# Patient Record
Sex: Male | Born: 1945 | Race: White | Marital: Married | Smoking: Never smoker
Health system: Southern US, Community
[De-identification: ages and names within clinical notes are randomized; demographics above are authoritative.]

## PROBLEM LIST (undated history)

## (undated) ENCOUNTER — Encounter

## (undated) ENCOUNTER — Encounter: Attending: Family Medicine | Primary: Family Medicine

## (undated) ENCOUNTER — Ambulatory Visit: Payer: MEDICARE | Attending: Physical Medicine & Rehabilitation | Primary: Physical Medicine & Rehabilitation

## (undated) ENCOUNTER — Telehealth

## (undated) ENCOUNTER — Encounter: Attending: Urology | Primary: Urology

## (undated) ENCOUNTER — Ambulatory Visit: Payer: MEDICARE | Attending: Urology | Primary: Urology

## (undated) ENCOUNTER — Encounter: Attending: Internal Medicine | Primary: Internal Medicine

## (undated) ENCOUNTER — Encounter: Attending: Adult Health | Primary: Adult Health

## (undated) ENCOUNTER — Telehealth
Attending: Student in an Organized Health Care Education/Training Program | Primary: Student in an Organized Health Care Education/Training Program

## (undated) ENCOUNTER — Telehealth: Attending: Urology | Primary: Urology

## (undated) ENCOUNTER — Encounter: Payer: MEDICARE | Attending: Family Medicine | Primary: Family Medicine

## (undated) ENCOUNTER — Ambulatory Visit

## (undated) ENCOUNTER — Telehealth: Attending: Surgical Oncology | Primary: Surgical Oncology

## (undated) ENCOUNTER — Inpatient Hospital Stay

## (undated) ENCOUNTER — Ambulatory Visit: Payer: MEDICARE

## (undated) ENCOUNTER — Telehealth: Attending: Hematology & Oncology | Primary: Hematology & Oncology

## (undated) ENCOUNTER — Ambulatory Visit
Attending: Student in an Organized Health Care Education/Training Program | Primary: Student in an Organized Health Care Education/Training Program

## (undated) ENCOUNTER — Encounter: Payer: MEDICARE | Attending: Urology | Primary: Urology

## (undated) ENCOUNTER — Telehealth: Attending: Adult Health | Primary: Adult Health

## (undated) ENCOUNTER — Encounter: Attending: Family | Primary: Family

## (undated) ENCOUNTER — Encounter: Payer: MEDICARE | Attending: Dentist | Primary: Dentist

## (undated) ENCOUNTER — Telehealth: Attending: Internal Medicine | Primary: Internal Medicine

## (undated) ENCOUNTER — Ambulatory Visit: Payer: MEDICARE | Attending: Mental Health | Primary: Mental Health

## (undated) ENCOUNTER — Ambulatory Visit: Payer: MEDICARE | Attending: Family Medicine | Primary: Family Medicine

## (undated) ENCOUNTER — Encounter: Attending: MOHS-Micrographic Surgery | Primary: MOHS-Micrographic Surgery

## (undated) ENCOUNTER — Telehealth: Attending: MOHS-Micrographic Surgery | Primary: MOHS-Micrographic Surgery

## (undated) ENCOUNTER — Ambulatory Visit: Payer: MEDICARE | Attending: Neurology | Primary: Neurology

## (undated) ENCOUNTER — Ambulatory Visit: Attending: Adult Health | Primary: Adult Health

## (undated) ENCOUNTER — Ambulatory Visit: Payer: MEDICARE | Attending: Hematology & Oncology | Primary: Hematology & Oncology

## (undated) ENCOUNTER — Ambulatory Visit: Payer: MEDICARE | Attending: Internal Medicine | Primary: Internal Medicine

## (undated) ENCOUNTER — Telehealth: Attending: Dentist | Primary: Dentist

## (undated) ENCOUNTER — Ambulatory Visit: Payer: MEDICARE | Attending: Hematology | Primary: Hematology

## (undated) ENCOUNTER — Telehealth: Attending: Family Medicine | Primary: Family Medicine

## (undated) ENCOUNTER — Ambulatory Visit: Payer: MEDICARE | Attending: Family | Primary: Family

## (undated) ENCOUNTER — Telehealth: Attending: Family | Primary: Family

## (undated) ENCOUNTER — Non-Acute Institutional Stay: Payer: MEDICARE | Attending: Family | Primary: Family

## (undated) ENCOUNTER — Non-Acute Institutional Stay: Payer: MEDICARE

## (undated) ENCOUNTER — Ambulatory Visit: Payer: MEDICARE | Attending: Cardiovascular Disease | Primary: Cardiovascular Disease

## (undated) ENCOUNTER — Encounter: Attending: Anesthesiology | Primary: Anesthesiology

## (undated) ENCOUNTER — Ambulatory Visit: Attending: Surgery | Primary: Surgery

## (undated) ENCOUNTER — Ambulatory Visit: Payer: MEDICARE | Attending: Adult Health | Primary: Adult Health

## (undated) ENCOUNTER — Ambulatory Visit: Attending: Family Medicine | Primary: Family Medicine

## (undated) ENCOUNTER — Encounter: Attending: Nurse Practitioner | Primary: Nurse Practitioner

## (undated) ENCOUNTER — Telehealth: Attending: Mental Health | Primary: Mental Health

## (undated) ENCOUNTER — Encounter: Payer: MEDICARE | Attending: Family | Primary: Family

## (undated) ENCOUNTER — Ambulatory Visit: Attending: Urology | Primary: Urology

## (undated) DIAGNOSIS — D649 Anemia, unspecified: Secondary | ICD-10-CM

## (undated) DIAGNOSIS — I499 Cardiac arrhythmia, unspecified: Secondary | ICD-10-CM

## (undated) DIAGNOSIS — E039 Hypothyroidism, unspecified: Secondary | ICD-10-CM

## (undated) HISTORY — PX: CHOLECYSTECTOMY: SHX55

## (undated) HISTORY — PX: COLON RESECTION: SHX5231

## (undated) HISTORY — PX: BACK SURGERY: SHX140

---
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
## 2010-05-06 HISTORY — PX: TOTAL HIP ARTHROPLASTY: SHX124

## 2014-09-05 LAB — LIPID PANEL
Cholesterol: 189 mg/dL (ref 0–200)
HDL: 40 mg/dL (ref 35–70)
LDL CALC: 107 mg/dL
TRIGLYCERIDES: 210 mg/dL — AB (ref 40–160)

## 2014-09-05 LAB — TSH: TSH: 2.7 u[IU]/mL (ref <=5.90)

## 2014-09-05 LAB — BASIC METABOLIC PANEL
BUN: 9 mg/dL (ref 4–21)
Creatinine: 1 mg/dL (ref <=1.3)

## 2014-09-05 LAB — CBC AND DIFFERENTIAL: HEMOGLOBIN: 14.4 g/dL (ref 13.5–17.5)

## 2015-03-08 ENCOUNTER — Other Ambulatory Visit: Payer: Self-pay | Admitting: Internal Medicine

## 2015-03-08 ENCOUNTER — Encounter: Payer: Self-pay | Admitting: Internal Medicine

## 2015-03-08 DIAGNOSIS — F5089 Other specified eating disorder: Secondary | ICD-10-CM | POA: Insufficient documentation

## 2015-03-08 DIAGNOSIS — N138 Other obstructive and reflux uropathy: Secondary | ICD-10-CM | POA: Insufficient documentation

## 2015-03-08 DIAGNOSIS — E039 Hypothyroidism, unspecified: Secondary | ICD-10-CM | POA: Insufficient documentation

## 2015-03-08 DIAGNOSIS — K5010 Crohn's disease of large intestine without complications: Secondary | ICD-10-CM | POA: Insufficient documentation

## 2015-03-08 DIAGNOSIS — I82409 Acute embolism and thrombosis of unspecified deep veins of unspecified lower extremity: Secondary | ICD-10-CM | POA: Insufficient documentation

## 2015-03-08 DIAGNOSIS — N401 Enlarged prostate with lower urinary tract symptoms: Secondary | ICD-10-CM

## 2015-03-08 DIAGNOSIS — S32000A Wedge compression fracture of unspecified lumbar vertebra, initial encounter for closed fracture: Secondary | ICD-10-CM | POA: Insufficient documentation

## 2016-11-08 MED ORDER — HYOSCYAMINE SULFATE 0.125 MG TABLET
ORAL_TABLET | Freq: Four times a day (QID) | ORAL | 2 refills | 0.00000 days | Status: CP | PRN
Start: 2016-11-08 — End: 2017-07-04

## 2016-11-24 ENCOUNTER — Emergency Department
Admission: EM | Admit: 2016-11-24 | Discharge: 2016-11-24 | Disposition: A | Discharge status: Home / Self Care | Payer: MEDICARE | Source: Intra-hospital | Attending: Family | Admitting: Family

## 2016-11-24 ENCOUNTER — Emergency Department
Admission: EM | Admit: 2016-11-24 | Discharge: 2016-11-24 | Disposition: A | Discharge status: Home / Self Care | Payer: MEDICARE | Source: Intra-hospital

## 2016-11-24 DIAGNOSIS — R197 Diarrhea, unspecified: Principal | ICD-10-CM

## 2016-11-24 MED ORDER — BIOTIN 0.5 MG/ML ORAL SOLUTION
Freq: Three times a day (TID) | ORAL | 0 refills | 0.00000 days | Status: CP
Start: 2016-11-24 — End: 2016-12-04

## 2016-11-24 MED ORDER — CEPHALEXIN 500 MG CAPSULE
ORAL_CAPSULE | Freq: Four times a day (QID) | ORAL | 0 refills | 0 days | Status: CP
Start: 2016-11-24 — End: 2016-12-01

## 2016-11-27 ENCOUNTER — Ambulatory Visit
Admission: RE | Admit: 2016-11-27 | Discharge: 2016-11-27 | Payer: MEDICARE | Attending: Family Medicine | Admitting: Family Medicine

## 2016-11-27 DIAGNOSIS — G4733 Obstructive sleep apnea (adult) (pediatric): Secondary | ICD-10-CM

## 2016-11-27 DIAGNOSIS — F411 Generalized anxiety disorder: Secondary | ICD-10-CM

## 2016-11-27 DIAGNOSIS — R682 Dry mouth, unspecified: Secondary | ICD-10-CM

## 2016-11-27 DIAGNOSIS — Z79899 Other long term (current) drug therapy: Secondary | ICD-10-CM

## 2016-11-27 DIAGNOSIS — Z9181 History of falling: Secondary | ICD-10-CM

## 2016-11-27 DIAGNOSIS — F321 Major depressive disorder, single episode, moderate: Secondary | ICD-10-CM

## 2016-11-27 DIAGNOSIS — E785 Hyperlipidemia, unspecified: Principal | ICD-10-CM

## 2016-11-27 MED ORDER — ATORVASTATIN 40 MG TABLET
ORAL_TABLET | Freq: Every day | ORAL | 11 refills | 0.00000 days | Status: CP
Start: 2016-11-27 — End: 2017-11-14

## 2016-12-03 ENCOUNTER — Ambulatory Visit
Admission: RE | Admit: 2016-12-03 | Discharge: 2016-12-03 | Disposition: A | Discharge status: Home / Self Care | Payer: MEDICARE | Attending: Family | Admitting: Family

## 2016-12-03 DIAGNOSIS — G4733 Obstructive sleep apnea (adult) (pediatric): Principal | ICD-10-CM

## 2016-12-03 DIAGNOSIS — G4734 Idiopathic sleep related nonobstructive alveolar hypoventilation: Secondary | ICD-10-CM

## 2016-12-03 DIAGNOSIS — R351 Nocturia: Secondary | ICD-10-CM

## 2016-12-09 MED ORDER — LEVOTHYROXINE 88 MCG TABLET
ORAL_TABLET | Freq: Every day | ORAL | 1 refills | 0.00000 days | Status: CP
Start: 2016-12-09 — End: 2017-06-02

## 2016-12-30 ENCOUNTER — Ambulatory Visit
Admission: RE | Admit: 2016-12-30 | Discharge: 2016-12-30 | Payer: MEDICARE | Attending: Family Medicine | Admitting: Family Medicine

## 2016-12-30 DIAGNOSIS — R682 Dry mouth, unspecified: Principal | ICD-10-CM

## 2016-12-30 DIAGNOSIS — F321 Major depressive disorder, single episode, moderate: Secondary | ICD-10-CM

## 2016-12-30 DIAGNOSIS — F411 Generalized anxiety disorder: Secondary | ICD-10-CM

## 2017-01-10 ENCOUNTER — Ambulatory Visit
Admission: RE | Admit: 2017-01-10 | Discharge: 2017-01-10 | Payer: MEDICARE | Attending: Internal Medicine | Admitting: Internal Medicine

## 2017-01-10 DIAGNOSIS — R109 Unspecified abdominal pain: Principal | ICD-10-CM

## 2017-01-27 ENCOUNTER — Ambulatory Visit: Admission: RE | Admit: 2017-01-27 | Discharge: 2017-01-27 | Payer: MEDICARE | Attending: Family Medicine

## 2017-01-27 DIAGNOSIS — Z23 Encounter for immunization: Secondary | ICD-10-CM

## 2017-01-27 DIAGNOSIS — R682 Dry mouth, unspecified: Secondary | ICD-10-CM

## 2017-01-27 DIAGNOSIS — F5089 Other specified eating disorder: Secondary | ICD-10-CM

## 2017-01-27 DIAGNOSIS — N401 Enlarged prostate with lower urinary tract symptoms: Secondary | ICD-10-CM

## 2017-01-27 DIAGNOSIS — R350 Frequency of micturition: Secondary | ICD-10-CM

## 2017-01-27 DIAGNOSIS — E039 Hypothyroidism, unspecified: Principal | ICD-10-CM

## 2017-01-27 DIAGNOSIS — G4733 Obstructive sleep apnea (adult) (pediatric): Secondary | ICD-10-CM

## 2017-01-27 DIAGNOSIS — F321 Major depressive disorder, single episode, moderate: Secondary | ICD-10-CM

## 2017-01-28 ENCOUNTER — Ambulatory Visit
Admission: RE | Admit: 2017-01-28 | Discharge: 2017-01-28 | Disposition: A | Discharge status: Home / Self Care | Payer: MEDICARE

## 2017-01-28 DIAGNOSIS — R682 Dry mouth, unspecified: Secondary | ICD-10-CM

## 2017-01-28 DIAGNOSIS — E039 Hypothyroidism, unspecified: Principal | ICD-10-CM

## 2017-03-08 ENCOUNTER — Ambulatory Visit
Admission: RE | Admit: 2017-03-08 | Discharge: 2017-03-08 | Disposition: A | Discharge status: Home / Self Care | Payer: MEDICARE

## 2017-03-08 DIAGNOSIS — R350 Frequency of micturition: Principal | ICD-10-CM

## 2017-03-24 MED ORDER — TAMSULOSIN 0.4 MG CAPSULE
ORAL_CAPSULE | 3 refills | 0 days | Status: CP
Start: 2017-03-24 — End: 2017-07-24

## 2017-03-31 MED ORDER — XARELTO 20 MG TABLET
ORAL_TABLET | 1 refills | 0 days | Status: CP
Start: 2017-03-31 — End: 2017-07-16

## 2017-04-03 ENCOUNTER — Emergency Department
Admission: EM | Admit: 2017-04-03 | Discharge: 2017-04-03 | Disposition: A | Discharge status: Home / Self Care | Payer: MEDICARE | Source: Intra-hospital

## 2017-04-03 DIAGNOSIS — R109 Unspecified abdominal pain: Principal | ICD-10-CM

## 2017-04-03 MED ORDER — ONDANSETRON HCL 4 MG TABLET
ORAL_TABLET | Freq: Three times a day (TID) | ORAL | 0 refills | 0.00000 days | Status: CP | PRN
Start: 2017-04-03 — End: 2017-04-07

## 2017-04-10 ENCOUNTER — Ambulatory Visit: Admission: RE | Admit: 2017-04-10 | Discharge: 2017-04-10 | Admitting: Urology

## 2017-04-10 DIAGNOSIS — N201 Calculus of ureter: Principal | ICD-10-CM

## 2017-04-19 ENCOUNTER — Ambulatory Visit
Admission: RE | Admit: 2017-04-19 | Discharge: 2017-04-19 | Disposition: A | Discharge status: Home / Self Care | Payer: MEDICARE

## 2017-04-19 ENCOUNTER — Emergency Department
Admission: EM | Admit: 2017-04-19 | Discharge: 2017-04-19 | Discharge status: Against Medical Advice | Payer: MEDICARE | Source: Intra-hospital | Attending: Emergency Medicine

## 2017-04-19 DIAGNOSIS — N201 Calculus of ureter: Principal | ICD-10-CM

## 2017-05-22 ENCOUNTER — Encounter: Admit: 2017-05-22 | Discharge: 2017-05-23 | Payer: MEDICARE | Attending: Urology | Primary: Urology

## 2017-05-22 DIAGNOSIS — N201 Calculus of ureter: Secondary | ICD-10-CM

## 2017-05-22 DIAGNOSIS — N390 Urinary tract infection, site not specified: Principal | ICD-10-CM

## 2017-05-23 MED ORDER — SUCRALFATE 1 GRAM TABLET
ORAL_TABLET | Freq: Four times a day (QID) | ORAL | 11 refills | 0 days | Status: CP
Start: 2017-05-23 — End: 2017-08-19

## 2017-05-26 MED ORDER — SULFAMETHOXAZOLE 800 MG-TRIMETHOPRIM 160 MG TABLET
ORAL_TABLET | Freq: Two times a day (BID) | ORAL | 0 refills | 0.00000 days | Status: CP
Start: 2017-05-26 — End: 2017-06-05

## 2017-06-02 ENCOUNTER — Encounter: Admit: 2017-06-02 | Discharge: 2017-06-03 | Payer: MEDICARE | Attending: Family Medicine | Primary: Family Medicine

## 2017-06-02 DIAGNOSIS — I2782 Chronic pulmonary embolism: Secondary | ICD-10-CM

## 2017-06-02 DIAGNOSIS — F5089 Other specified eating disorder: Secondary | ICD-10-CM

## 2017-06-02 DIAGNOSIS — I82531 Chronic embolism and thrombosis of right popliteal vein: Secondary | ICD-10-CM

## 2017-06-02 DIAGNOSIS — K130 Diseases of lips: Principal | ICD-10-CM

## 2017-06-02 DIAGNOSIS — F321 Major depressive disorder, single episode, moderate: Secondary | ICD-10-CM

## 2017-06-02 DIAGNOSIS — F411 Generalized anxiety disorder: Secondary | ICD-10-CM

## 2017-06-04 ENCOUNTER — Ambulatory Visit: Admit: 2017-06-04 | Discharge: 2017-06-05 | Payer: MEDICARE

## 2017-06-04 DIAGNOSIS — G4733 Obstructive sleep apnea (adult) (pediatric): Principal | ICD-10-CM

## 2017-06-04 DIAGNOSIS — K50119 Crohn's disease of large intestine with unspecified complications: Secondary | ICD-10-CM

## 2017-06-04 DIAGNOSIS — E039 Hypothyroidism, unspecified: Secondary | ICD-10-CM

## 2017-06-04 DIAGNOSIS — F321 Major depressive disorder, single episode, moderate: Secondary | ICD-10-CM

## 2017-06-04 DIAGNOSIS — K22719 Barrett's esophagus with dysplasia, unspecified: Secondary | ICD-10-CM

## 2017-06-06 MED ORDER — LEVOTHYROXINE 88 MCG TABLET
ORAL_TABLET | 1 refills | 0 days | Status: CP
Start: 2017-06-06 — End: 2017-11-14

## 2017-06-12 DIAGNOSIS — N202 Calculus of kidney with calculus of ureter: Principal | ICD-10-CM

## 2017-06-13 ENCOUNTER — Encounter: Admit: 2017-06-13 | Discharge: 2017-06-13 | Payer: MEDICARE

## 2017-06-13 ENCOUNTER — Encounter
Admit: 2017-06-13 | Discharge: 2017-06-13 | Payer: MEDICARE | Attending: Student in an Organized Health Care Education/Training Program | Primary: Student in an Organized Health Care Education/Training Program

## 2017-06-13 DIAGNOSIS — N202 Calculus of kidney with calculus of ureter: Principal | ICD-10-CM

## 2017-06-13 MED ORDER — SENNOSIDES 8.6 MG TABLET
ORAL_TABLET | Freq: Every day | ORAL | 0 refills | 0.00000 days | Status: CP
Start: 2017-06-13 — End: 2017-06-28

## 2017-06-13 MED ORDER — DOCUSATE SODIUM 100 MG CAPSULE
ORAL_CAPSULE | Freq: Two times a day (BID) | ORAL | 0 refills | 0 days | Status: CP
Start: 2017-06-13 — End: 2017-06-21

## 2017-06-13 MED ORDER — OXYCODONE 5 MG TABLET
ORAL_TABLET | ORAL | 0 refills | 0 days | Status: CP | PRN
Start: 2017-06-13 — End: 2017-06-17

## 2017-06-17 MED ORDER — OXYCODONE 5 MG TABLET
ORAL_TABLET | ORAL | 0 refills | 0.00000 days | Status: CP | PRN
Start: 2017-06-17 — End: 2017-06-22

## 2017-06-17 MED ORDER — OXYCODONE 5 MG TABLET: 5 mg | tablet | 0 refills | 0 days | Status: AC

## 2017-06-18 MED ORDER — SULFAMETHOXAZOLE 800 MG-TRIMETHOPRIM 160 MG TABLET
ORAL_TABLET | Freq: Two times a day (BID) | ORAL | 0 refills | 0 days | Status: CP
Start: 2017-06-18 — End: 2017-06-19

## 2017-06-19 ENCOUNTER — Encounter: Admit: 2017-06-19 | Discharge: 2017-06-20 | Payer: MEDICARE

## 2017-06-19 DIAGNOSIS — K1379 Other lesions of oral mucosa: Secondary | ICD-10-CM

## 2017-06-19 DIAGNOSIS — K130 Diseases of lips: Principal | ICD-10-CM

## 2017-06-19 MED ORDER — VALACYCLOVIR 1 GRAM TABLET
ORAL_TABLET | Freq: Every day | ORAL | 0 refills | 0.00000 days | Status: CP
Start: 2017-06-19 — End: 2017-07-04

## 2017-06-19 MED ORDER — LIDOCAINE 5 % TOPICAL OINTMENT
Freq: Every day | TOPICAL | 6 refills | 0.00000 days | Status: CP
Start: 2017-06-19 — End: 2017-07-24

## 2017-06-30 MED ORDER — NYSTATIN 100,000 UNIT/ML ORAL SUSPENSION
ORAL | 6 refills | 0.00000 days | Status: CP
Start: 2017-06-30 — End: 2017-07-04

## 2017-07-03 ENCOUNTER — Encounter: Admit: 2017-07-03 | Discharge: 2017-07-04 | Payer: MEDICARE

## 2017-07-03 DIAGNOSIS — K1379 Other lesions of oral mucosa: Principal | ICD-10-CM

## 2017-07-03 DIAGNOSIS — R21 Rash and other nonspecific skin eruption: Secondary | ICD-10-CM

## 2017-07-03 DIAGNOSIS — L439 Lichen planus, unspecified: Secondary | ICD-10-CM

## 2017-07-06 ENCOUNTER — Encounter: Admit: 2017-07-06 | Discharge: 2017-07-07 | Payer: MEDICARE

## 2017-07-06 DIAGNOSIS — R202 Paresthesia of skin: Principal | ICD-10-CM

## 2017-07-07 MED ORDER — POTASSIUM CHLORIDE ER 20 MEQ TABLET,EXTENDED RELEASE
ORAL_TABLET | Freq: Every day | ORAL | 0 refills | 0 days | Status: SS
Start: 2017-07-07 — End: 2017-07-16

## 2017-07-08 MED ORDER — CLOBETASOL 0.05 % TOPICAL GEL
2 refills | 0 days | Status: CP
Start: 2017-07-08 — End: 2017-12-15

## 2017-07-14 ENCOUNTER — Encounter: Admit: 2017-07-14 | Discharge: 2017-07-16 | Payer: MEDICARE

## 2017-07-14 ENCOUNTER — Ambulatory Visit: Admit: 2017-07-14 | Discharge: 2017-07-16 | Payer: MEDICARE

## 2017-07-14 DIAGNOSIS — R200 Anesthesia of skin: Principal | ICD-10-CM

## 2017-07-16 DIAGNOSIS — R200 Anesthesia of skin: Principal | ICD-10-CM

## 2017-07-16 MED ORDER — APIXABAN 5 MG TABLET
ORAL_TABLET | 1 refills | 0 days | Status: CP
Start: 2017-07-16 — End: 2017-09-03

## 2017-07-16 MED ORDER — POTASSIUM CHLORIDE ER 20 MEQ TABLET,EXTENDED RELEASE
ORAL_TABLET | Freq: Every day | ORAL | 0 refills | 0.00000 days | Status: CP
Start: 2017-07-16 — End: 2017-08-16

## 2017-07-16 MED ORDER — ACETAMINOPHEN 500 MG TABLET
ORAL_TABLET | Freq: Four times a day (QID) | ORAL | 0 refills | 0.00000 days | PRN
Start: 2017-07-16 — End: 2018-02-16

## 2017-07-16 MED ORDER — CEPHALEXIN 500 MG CAPSULE
ORAL_CAPSULE | Freq: Three times a day (TID) | ORAL | 0 refills | 0 days | Status: CP
Start: 2017-07-16 — End: 2017-08-16

## 2017-07-17 MED ORDER — CYANOCOBALAMIN (VIT B-12) 1,000 MCG TABLET
ORAL_TABLET | Freq: Every day | ORAL | 2 refills | 0.00000 days
Start: 2017-07-17 — End: 2017-08-19

## 2017-07-20 MED ORDER — PREDNISONE 20 MG TABLET
ORAL_TABLET | Freq: Every day | ORAL | 0 refills | 0.00000 days | Status: CP
Start: 2017-07-20 — End: 2017-07-24

## 2017-07-22 ENCOUNTER — Encounter: Admit: 2017-07-22 | Discharge: 2017-07-23 | Payer: MEDICARE | Attending: Family Medicine | Primary: Family Medicine

## 2017-07-22 DIAGNOSIS — E876 Hypokalemia: Secondary | ICD-10-CM

## 2017-07-22 DIAGNOSIS — K130 Diseases of lips: Secondary | ICD-10-CM

## 2017-07-22 DIAGNOSIS — E039 Hypothyroidism, unspecified: Secondary | ICD-10-CM

## 2017-07-22 DIAGNOSIS — D539 Nutritional anemia, unspecified: Secondary | ICD-10-CM

## 2017-07-22 DIAGNOSIS — B370 Candidal stomatitis: Principal | ICD-10-CM

## 2017-07-22 MED ORDER — CLOTRIMAZOLE 10 MG TROCHE
ORAL_TABLET | ORAL | 0 refills | 0 days | Status: CP
Start: 2017-07-22 — End: 2017-08-19

## 2017-07-22 MED ORDER — NYSTATIN 100,000 UNIT/ML ORAL SUSPENSION
Freq: Four times a day (QID) | ORAL | 0 refills | 0 days | Status: CP
Start: 2017-07-22 — End: 2017-08-19

## 2017-07-24 ENCOUNTER — Ambulatory Visit: Admit: 2017-07-24 | Discharge: 2017-07-24 | Payer: MEDICARE

## 2017-07-24 ENCOUNTER — Encounter: Admit: 2017-07-24 | Discharge: 2017-07-24 | Payer: MEDICARE

## 2017-07-24 ENCOUNTER — Encounter: Admit: 2017-07-24 | Discharge: 2017-07-24 | Payer: MEDICARE | Attending: Urology | Primary: Urology

## 2017-07-24 DIAGNOSIS — N201 Calculus of ureter: Principal | ICD-10-CM

## 2017-07-24 DIAGNOSIS — L439 Lichen planus, unspecified: Principal | ICD-10-CM

## 2017-07-24 DIAGNOSIS — R351 Nocturia: Secondary | ICD-10-CM

## 2017-07-24 DIAGNOSIS — N200 Calculus of kidney: Principal | ICD-10-CM

## 2017-07-24 MED ORDER — PREDNISONE 10 MG TABLET
ORAL_TABLET | 0 refills | 0 days | Status: CP
Start: 2017-07-24 — End: 2017-08-19

## 2017-07-25 ENCOUNTER — Encounter: Admit: 2017-07-25 | Discharge: 2017-08-07 | Payer: MEDICARE

## 2017-07-25 DIAGNOSIS — I2699 Other pulmonary embolism without acute cor pulmonale: Principal | ICD-10-CM

## 2017-08-07 ENCOUNTER — Encounter: Admit: 2017-08-07 | Discharge: 2017-08-08 | Payer: MEDICARE

## 2017-08-07 DIAGNOSIS — B370 Candidal stomatitis: Secondary | ICD-10-CM

## 2017-08-07 DIAGNOSIS — L439 Lichen planus, unspecified: Principal | ICD-10-CM

## 2017-08-07 MED ORDER — FLUCONAZOLE 150 MG TABLET
ORAL_TABLET | 0 refills | 0 days | Status: CP
Start: 2017-08-07 — End: 2017-09-02

## 2017-08-07 MED ORDER — MYCOPHENOLATE MOFETIL 250 MG CAPSULE
ORAL_CAPSULE | Freq: Two times a day (BID) | ORAL | 1 refills | 0 days | Status: CP
Start: 2017-08-07 — End: 2017-09-04

## 2017-08-16 ENCOUNTER — Encounter
Admit: 2017-08-16 | Discharge: 2017-08-16 | Disposition: A | Discharge status: Home / Self Care | Payer: MEDICARE | Attending: Family

## 2017-08-16 DIAGNOSIS — L438 Other lichen planus: Principal | ICD-10-CM

## 2017-08-16 MED ORDER — POTASSIUM CHLORIDE ER 20 MEQ TABLET,EXTENDED RELEASE(PART/CRYST)
ORAL_TABLET | Freq: Every day | ORAL | 0 refills | 0.00000 days | Status: CP
Start: 2017-08-16 — End: 2017-09-02

## 2017-08-19 ENCOUNTER — Encounter: Admit: 2017-08-19 | Discharge: 2017-08-20 | Payer: MEDICARE

## 2017-08-19 DIAGNOSIS — N201 Calculus of ureter: Secondary | ICD-10-CM

## 2017-08-19 DIAGNOSIS — N400 Enlarged prostate without lower urinary tract symptoms: Secondary | ICD-10-CM

## 2017-08-19 DIAGNOSIS — R3121 Asymptomatic microscopic hematuria: Principal | ICD-10-CM

## 2017-08-19 DIAGNOSIS — Z7189 Other specified counseling: Secondary | ICD-10-CM

## 2017-08-22 ENCOUNTER — Encounter
Admit: 2017-08-22 | Discharge: 2017-08-23 | Payer: MEDICARE | Attending: Nurse Practitioner | Primary: Nurse Practitioner

## 2017-08-22 DIAGNOSIS — R200 Anesthesia of skin: Principal | ICD-10-CM

## 2017-08-22 DIAGNOSIS — R239 Unspecified skin changes: Secondary | ICD-10-CM

## 2017-08-27 ENCOUNTER — Encounter: Admit: 2017-08-27 | Discharge: 2017-08-28 | Payer: MEDICARE

## 2017-08-27 DIAGNOSIS — G6289 Other specified polyneuropathies: Principal | ICD-10-CM

## 2017-08-27 DIAGNOSIS — G603 Idiopathic progressive neuropathy: Secondary | ICD-10-CM

## 2017-09-02 ENCOUNTER — Encounter: Admit: 2017-09-02 | Discharge: 2017-09-02 | Payer: MEDICARE | Attending: Family Medicine | Primary: Family Medicine

## 2017-09-02 ENCOUNTER — Encounter: Admit: 2017-09-02 | Discharge: 2017-09-02 | Payer: MEDICARE | Attending: Mental Health | Primary: Mental Health

## 2017-09-02 DIAGNOSIS — Z0000 Encounter for general adult medical examination without abnormal findings: Principal | ICD-10-CM

## 2017-09-02 DIAGNOSIS — E039 Hypothyroidism, unspecified: Secondary | ICD-10-CM

## 2017-09-02 DIAGNOSIS — R4189 Other symptoms and signs involving cognitive functions and awareness: Secondary | ICD-10-CM

## 2017-09-02 DIAGNOSIS — E876 Hypokalemia: Principal | ICD-10-CM

## 2017-09-02 DIAGNOSIS — G6289 Other specified polyneuropathies: Secondary | ICD-10-CM

## 2017-09-03 MED ORDER — APIXABAN 5 MG TABLET
ORAL_TABLET | 1 refills | 0 days | Status: CP
Start: 2017-09-03 — End: 2017-11-14

## 2017-09-04 ENCOUNTER — Encounter: Admit: 2017-09-04 | Discharge: 2017-09-05 | Payer: MEDICARE

## 2017-09-04 DIAGNOSIS — L439 Lichen planus, unspecified: Principal | ICD-10-CM

## 2017-09-04 MED ORDER — MYCOPHENOLATE MOFETIL 250 MG CAPSULE
ORAL_CAPSULE | Freq: Two times a day (BID) | ORAL | 3 refills | 0 days | Status: CP
Start: 2017-09-04 — End: 2017-12-15

## 2017-09-04 MED ORDER — DEXAMETHASONE 0.5 MG/5 ML ORAL SOLUTION
3 refills | 0 days | Status: CP
Start: 2017-09-04 — End: 2017-10-11

## 2017-09-16 ENCOUNTER — Encounter
Admit: 2017-09-16 | Discharge: 2017-09-17 | Payer: MEDICARE | Attending: Hematology & Oncology | Primary: Hematology & Oncology

## 2017-09-16 DIAGNOSIS — I2699 Other pulmonary embolism without acute cor pulmonale: Principal | ICD-10-CM

## 2017-09-30 ENCOUNTER — Encounter: Admit: 2017-09-30 | Discharge: 2017-09-30 | Disposition: A | Discharge status: Home / Self Care | Payer: MEDICARE

## 2017-10-03 MED ORDER — ESOMEPRAZOLE MAGNESIUM 40 MG CAPSULE,DELAYED RELEASE
ORAL_CAPSULE | 0 refills | 0 days | Status: CP
Start: 2017-10-03 — End: 2017-12-15

## 2017-10-07 ENCOUNTER — Encounter: Admit: 2017-10-07 | Discharge: 2017-10-08 | Payer: MEDICARE | Attending: Family Medicine | Primary: Family Medicine

## 2017-10-07 DIAGNOSIS — H6983 Other specified disorders of Eustachian tube, bilateral: Secondary | ICD-10-CM

## 2017-10-07 DIAGNOSIS — K130 Diseases of lips: Secondary | ICD-10-CM

## 2017-10-07 DIAGNOSIS — G6289 Other specified polyneuropathies: Principal | ICD-10-CM

## 2017-10-07 DIAGNOSIS — E039 Hypothyroidism, unspecified: Secondary | ICD-10-CM

## 2017-10-07 MED ORDER — FLUTICASONE PROPIONATE 50 MCG/ACTUATION NASAL SPRAY,SUSPENSION
Freq: Every day | NASAL | 11 refills | 0.00000 days | Status: CP
Start: 2017-10-07 — End: 2018-02-16

## 2017-10-11 MED ORDER — DEXAMETHASONE 0.5 MG/5 ML ORAL SOLUTION
6 refills | 0 days | Status: CP
Start: 2017-10-11 — End: 2018-02-16

## 2017-10-11 MED ORDER — DEXAMETHASONE 0.5 MG/5 ML ORAL SOLUTION: mL | 6 refills | 0 days | Status: AC

## 2017-10-27 MED ORDER — NYSTATIN 100,000 UNIT/ML ORAL SUSPENSION
6 refills | 0 days | Status: CP
Start: 2017-10-27 — End: 2017-12-15

## 2017-11-13 ENCOUNTER — Encounter: Admit: 2017-11-13 | Discharge: 2017-11-14 | Payer: MEDICARE

## 2017-11-13 DIAGNOSIS — L439 Lichen planus, unspecified: Secondary | ICD-10-CM

## 2017-11-13 DIAGNOSIS — R21 Rash and other nonspecific skin eruption: Principal | ICD-10-CM

## 2017-11-13 MED ORDER — TACROLIMUS 0.1 % TOPICAL OINTMENT
9 refills | 0 days | Status: CP
Start: 2017-11-13 — End: 2018-02-16

## 2017-11-14 MED ORDER — LEVOTHYROXINE 88 MCG TABLET
ORAL_TABLET | 1 refills | 0 days | Status: CP
Start: 2017-11-14 — End: 2017-12-19

## 2017-11-14 MED ORDER — ATORVASTATIN 40 MG TABLET
ORAL_TABLET | 11 refills | 0 days | Status: CP
Start: 2017-11-14 — End: 2018-11-13

## 2017-11-14 MED ORDER — APIXABAN 5 MG TABLET
ORAL_TABLET | 1 refills | 0 days | Status: CP
Start: 2017-11-14 — End: 2018-01-09

## 2017-12-10 ENCOUNTER — Ambulatory Visit: Admit: 2017-12-10 | Discharge: 2017-12-11 | Payer: MEDICARE

## 2017-12-10 DIAGNOSIS — R351 Nocturia: Principal | ICD-10-CM

## 2017-12-15 ENCOUNTER — Encounter: Admit: 2017-12-15 | Discharge: 2017-12-16 | Payer: MEDICARE | Attending: Family Medicine | Primary: Family Medicine

## 2017-12-15 DIAGNOSIS — N401 Enlarged prostate with lower urinary tract symptoms: Secondary | ICD-10-CM

## 2017-12-15 DIAGNOSIS — R350 Frequency of micturition: Secondary | ICD-10-CM

## 2017-12-15 DIAGNOSIS — E876 Hypokalemia: Secondary | ICD-10-CM

## 2017-12-15 DIAGNOSIS — D539 Nutritional anemia, unspecified: Secondary | ICD-10-CM

## 2017-12-15 DIAGNOSIS — E039 Hypothyroidism, unspecified: Principal | ICD-10-CM

## 2017-12-15 MED ORDER — TAMSULOSIN 0.4 MG CAPSULE
ORAL_CAPSULE | Freq: Two times a day (BID) | ORAL | 0 refills | 0 days | Status: CP
Start: 2017-12-15 — End: 2018-02-16

## 2017-12-15 MED ORDER — CLOTRIMAZOLE 10 MG TROCHE
Freq: Three times a day (TID) | ORAL | 0 refills | 0 days | Status: CP
Start: 2017-12-15 — End: 2018-02-16

## 2017-12-19 MED ORDER — LEVOTHYROXINE 100 MCG TABLET
ORAL_TABLET | Freq: Every day | ORAL | 11 refills | 0 days | Status: CP
Start: 2017-12-19 — End: 2018-12-10

## 2017-12-30 ENCOUNTER — Ambulatory Visit: Admit: 2017-12-30 | Discharge: 2017-12-31 | Payer: MEDICARE | Attending: Neurology | Primary: Neurology

## 2017-12-30 DIAGNOSIS — G6289 Other specified polyneuropathies: Secondary | ICD-10-CM

## 2017-12-30 DIAGNOSIS — R4189 Other symptoms and signs involving cognitive functions and awareness: Principal | ICD-10-CM

## 2018-01-08 ENCOUNTER — Ambulatory Visit: Admit: 2018-01-08 | Discharge: 2018-01-09 | Payer: MEDICARE | Attending: Urology | Primary: Urology

## 2018-01-08 ENCOUNTER — Encounter: Admit: 2018-01-08 | Discharge: 2018-01-09 | Payer: MEDICARE

## 2018-01-08 DIAGNOSIS — L439 Lichen planus, unspecified: Principal | ICD-10-CM

## 2018-01-08 DIAGNOSIS — R399 Unspecified symptoms and signs involving the genitourinary system: Secondary | ICD-10-CM

## 2018-01-08 DIAGNOSIS — N401 Enlarged prostate with lower urinary tract symptoms: Principal | ICD-10-CM

## 2018-01-08 DIAGNOSIS — R350 Frequency of micturition: Secondary | ICD-10-CM

## 2018-01-08 DIAGNOSIS — R351 Nocturia: Secondary | ICD-10-CM

## 2018-01-08 DIAGNOSIS — K1379 Other lesions of oral mucosa: Secondary | ICD-10-CM

## 2018-01-08 DIAGNOSIS — K130 Diseases of lips: Secondary | ICD-10-CM

## 2018-01-09 MED ORDER — APIXABAN 5 MG TABLET
2 refills | 0 days | Status: CP
Start: 2018-01-09 — End: 2018-09-14

## 2018-02-05 ENCOUNTER — Ambulatory Visit: Admit: 2018-02-05 | Discharge: 2018-02-05 | Payer: MEDICARE

## 2018-02-05 ENCOUNTER — Encounter: Admit: 2018-02-05 | Discharge: 2018-02-05 | Payer: MEDICARE | Attending: Urology | Primary: Urology

## 2018-02-05 ENCOUNTER — Encounter: Admit: 2018-02-05 | Discharge: 2018-02-05 | Payer: MEDICARE | Attending: Family | Primary: Family

## 2018-02-05 DIAGNOSIS — L439 Lichen planus, unspecified: Secondary | ICD-10-CM

## 2018-02-05 DIAGNOSIS — B001 Herpesviral vesicular dermatitis: Principal | ICD-10-CM

## 2018-02-05 DIAGNOSIS — R399 Unspecified symptoms and signs involving the genitourinary system: Principal | ICD-10-CM

## 2018-02-05 DIAGNOSIS — K1379 Other lesions of oral mucosa: Secondary | ICD-10-CM

## 2018-02-05 MED ORDER — VALACYCLOVIR 1 GRAM TABLET
ORAL_TABLET | 0 refills | 0 days | Status: CP
Start: 2018-02-05 — End: 2018-02-12

## 2018-02-12 ENCOUNTER — Ambulatory Visit: Admit: 2018-02-12 | Discharge: 2018-02-13 | Payer: MEDICARE

## 2018-02-12 DIAGNOSIS — K1379 Other lesions of oral mucosa: Secondary | ICD-10-CM

## 2018-02-12 DIAGNOSIS — B001 Herpesviral vesicular dermatitis: Principal | ICD-10-CM

## 2018-02-12 MED ORDER — VALACYCLOVIR 500 MG TABLET
Freq: Every day | ORAL | 0 refills | 0 days | Status: CP
Start: 2018-02-12 — End: 2018-03-14

## 2018-02-16 ENCOUNTER — Encounter: Admit: 2018-02-16 | Discharge: 2018-02-17 | Payer: MEDICARE | Attending: Family Medicine | Primary: Family Medicine

## 2018-02-16 DIAGNOSIS — R351 Nocturia: Secondary | ICD-10-CM

## 2018-02-16 DIAGNOSIS — R413 Other amnesia: Secondary | ICD-10-CM

## 2018-02-16 DIAGNOSIS — H9190 Unspecified hearing loss, unspecified ear: Secondary | ICD-10-CM

## 2018-02-16 DIAGNOSIS — G6289 Other specified polyneuropathies: Secondary | ICD-10-CM

## 2018-02-16 DIAGNOSIS — G4733 Obstructive sleep apnea (adult) (pediatric): Principal | ICD-10-CM

## 2018-02-16 DIAGNOSIS — K439 Ventral hernia without obstruction or gangrene: Secondary | ICD-10-CM

## 2018-02-16 DIAGNOSIS — E039 Hypothyroidism, unspecified: Secondary | ICD-10-CM

## 2018-02-16 DIAGNOSIS — N401 Enlarged prostate with lower urinary tract symptoms: Secondary | ICD-10-CM

## 2018-02-19 ENCOUNTER — Encounter: Admit: 2018-02-19 | Discharge: 2018-02-20 | Payer: MEDICARE | Attending: Family | Primary: Family

## 2018-02-19 DIAGNOSIS — R351 Nocturia: Secondary | ICD-10-CM

## 2018-02-19 DIAGNOSIS — G4733 Obstructive sleep apnea (adult) (pediatric): Principal | ICD-10-CM

## 2018-03-04 ENCOUNTER — Encounter: Admit: 2018-03-04 | Discharge: 2018-03-06 | Payer: MEDICARE

## 2018-03-04 DIAGNOSIS — G4733 Obstructive sleep apnea (adult) (pediatric): Principal | ICD-10-CM

## 2018-03-06 ENCOUNTER — Encounter: Admit: 2018-03-06 | Discharge: 2018-03-07 | Payer: MEDICARE

## 2018-03-06 DIAGNOSIS — R399 Unspecified symptoms and signs involving the genitourinary system: Principal | ICD-10-CM

## 2018-03-14 ENCOUNTER — Ambulatory Visit
Admit: 2018-03-14 | Discharge: 2018-03-14 | Disposition: A | Discharge status: Home / Self Care | Payer: MEDICARE | Attending: Family

## 2018-03-14 ENCOUNTER — Emergency Department
Admit: 2018-03-14 | Discharge: 2018-03-14 | Disposition: A | Discharge status: Home / Self Care | Payer: MEDICARE | Attending: Family

## 2018-03-14 DIAGNOSIS — R319 Hematuria, unspecified: Principal | ICD-10-CM

## 2018-03-14 MED ORDER — CEFDINIR 300 MG CAPSULE
ORAL_CAPSULE | Freq: Every day | ORAL | 0 refills | 0 days | Status: CP
Start: 2018-03-14 — End: 2018-04-16

## 2018-03-18 ENCOUNTER — Ambulatory Visit: Admit: 2018-03-18 | Discharge: 2018-03-19 | Payer: MEDICARE | Attending: Audiologist | Primary: Audiologist

## 2018-03-18 DIAGNOSIS — H9190 Unspecified hearing loss, unspecified ear: Principal | ICD-10-CM

## 2018-03-18 DIAGNOSIS — H903 Sensorineural hearing loss, bilateral: Secondary | ICD-10-CM

## 2018-03-19 ENCOUNTER — Encounter: Admit: 2018-03-19 | Discharge: 2018-03-20 | Payer: MEDICARE

## 2018-03-19 DIAGNOSIS — R208 Other disturbances of skin sensation: Principal | ICD-10-CM

## 2018-03-19 DIAGNOSIS — L438 Other lichen planus: Secondary | ICD-10-CM

## 2018-03-24 ENCOUNTER — Encounter: Admit: 2018-03-24 | Discharge: 2018-03-25 | Payer: MEDICARE

## 2018-03-24 DIAGNOSIS — R319 Hematuria, unspecified: Principal | ICD-10-CM

## 2018-03-27 ENCOUNTER — Encounter: Admit: 2018-03-27 | Discharge: 2018-03-28 | Payer: MEDICARE | Attending: Family | Primary: Family

## 2018-03-27 DIAGNOSIS — G4733 Obstructive sleep apnea (adult) (pediatric): Principal | ICD-10-CM

## 2018-04-10 ENCOUNTER — Encounter: Admit: 2018-04-10 | Discharge: 2018-04-11 | Payer: MEDICARE

## 2018-04-10 DIAGNOSIS — R319 Hematuria, unspecified: Principal | ICD-10-CM

## 2018-04-10 MED ORDER — TAMSULOSIN 0.4 MG CAPSULE
ORAL_CAPSULE | 0 refills | 0 days | Status: CP
Start: 2018-04-10 — End: 2018-05-20

## 2018-04-16 ENCOUNTER — Encounter: Admit: 2018-04-16 | Discharge: 2018-04-16 | Payer: MEDICARE

## 2018-04-16 ENCOUNTER — Ambulatory Visit: Admit: 2018-04-16 | Discharge: 2018-04-16 | Payer: MEDICARE

## 2018-04-16 DIAGNOSIS — H918X9 Other specified hearing loss, unspecified ear: Secondary | ICD-10-CM

## 2018-04-16 DIAGNOSIS — H903 Sensorineural hearing loss, bilateral: Principal | ICD-10-CM

## 2018-04-16 DIAGNOSIS — H6123 Impacted cerumen, bilateral: Secondary | ICD-10-CM

## 2018-04-23 ENCOUNTER — Encounter: Admit: 2018-04-23 | Discharge: 2018-04-24 | Payer: MEDICARE

## 2018-04-23 DIAGNOSIS — R208 Other disturbances of skin sensation: Principal | ICD-10-CM

## 2018-04-23 DIAGNOSIS — L438 Other lichen planus: Secondary | ICD-10-CM

## 2018-04-23 DIAGNOSIS — H918X9 Other specified hearing loss, unspecified ear: Principal | ICD-10-CM

## 2018-04-30 ENCOUNTER — Encounter: Admit: 2018-04-30 | Discharge: 2018-05-01 | Payer: MEDICARE

## 2018-04-30 DIAGNOSIS — L438 Other lichen planus: Principal | ICD-10-CM

## 2018-04-30 DIAGNOSIS — B370 Candidal stomatitis: Secondary | ICD-10-CM

## 2018-04-30 MED ORDER — FLUCONAZOLE 150 MG TABLET
ORAL_TABLET | 0 refills | 0.00000 days | Status: CP
Start: 2018-04-30 — End: 2018-08-03

## 2018-05-15 ENCOUNTER — Encounter: Admit: 2018-05-15 | Discharge: 2018-05-16 | Payer: MEDICARE

## 2018-05-15 DIAGNOSIS — N201 Calculus of ureter: Principal | ICD-10-CM

## 2018-05-20 ENCOUNTER — Encounter: Admit: 2018-05-20 | Discharge: 2018-05-21 | Payer: MEDICARE | Attending: Family Medicine | Primary: Family Medicine

## 2018-05-20 DIAGNOSIS — K22719 Barrett's esophagus with dysplasia, unspecified: Secondary | ICD-10-CM

## 2018-05-20 DIAGNOSIS — F321 Major depressive disorder, single episode, moderate: Secondary | ICD-10-CM

## 2018-05-20 DIAGNOSIS — G4733 Obstructive sleep apnea (adult) (pediatric): Secondary | ICD-10-CM

## 2018-05-20 DIAGNOSIS — L438 Other lichen planus: Secondary | ICD-10-CM

## 2018-05-20 DIAGNOSIS — I2699 Other pulmonary embolism without acute cor pulmonale: Principal | ICD-10-CM

## 2018-06-18 ENCOUNTER — Encounter: Admit: 2018-06-18 | Discharge: 2018-06-18 | Payer: MEDICARE | Attending: Family | Primary: Family

## 2018-06-18 ENCOUNTER — Ambulatory Visit: Admit: 2018-06-18 | Discharge: 2018-06-18 | Payer: MEDICARE

## 2018-06-18 DIAGNOSIS — G4733 Obstructive sleep apnea (adult) (pediatric): Principal | ICD-10-CM

## 2018-06-18 DIAGNOSIS — L438 Other lichen planus: Principal | ICD-10-CM

## 2018-08-03 ENCOUNTER — Institutional Professional Consult (permissible substitution): Admit: 2018-08-03 | Discharge: 2018-08-04 | Payer: MEDICARE

## 2018-08-03 DIAGNOSIS — B370 Candidal stomatitis: Principal | ICD-10-CM

## 2018-08-03 DIAGNOSIS — L438 Other lichen planus: Principal | ICD-10-CM

## 2018-08-03 MED ORDER — FLUCONAZOLE 150 MG TABLET
ORAL_TABLET | 3 refills | 0.00000 days | Status: CP
Start: 2018-08-03 — End: ?

## 2018-08-19 MED ORDER — LIDOCAINE 5 % TOPICAL OINTMENT
0 refills | 0 days | Status: CP
Start: 2018-08-19 — End: ?

## 2018-09-02 MED ORDER — GABAPENTIN 300 MG CAPSULE: capsule | 1 refills | 0 days | Status: AC

## 2018-09-02 MED ORDER — GABAPENTIN 300 MG CAPSULE
ORAL_CAPSULE | ORAL | 1 refills | 0.00000 days | Status: CP
Start: 2018-09-02 — End: 2018-09-02

## 2018-09-07 ENCOUNTER — Encounter: Admit: 2018-09-07 | Discharge: 2018-09-08 | Payer: MEDICARE | Attending: Family Medicine | Primary: Family Medicine

## 2018-09-07 DIAGNOSIS — N401 Enlarged prostate with lower urinary tract symptoms: Secondary | ICD-10-CM

## 2018-09-07 DIAGNOSIS — G4733 Obstructive sleep apnea (adult) (pediatric): Secondary | ICD-10-CM

## 2018-09-07 DIAGNOSIS — R351 Nocturia: Secondary | ICD-10-CM

## 2018-09-07 DIAGNOSIS — E039 Hypothyroidism, unspecified: Principal | ICD-10-CM

## 2018-09-14 MED ORDER — APIXABAN 5 MG TABLET
2 refills | 0 days | Status: CP
Start: 2018-09-14 — End: 2018-10-06

## 2018-10-06 MED ORDER — APIXABAN 5 MG TABLET
Freq: Two times a day (BID) | ORAL | 3 refills | 0 days | Status: CP
Start: 2018-10-06 — End: ?

## 2018-11-05 ENCOUNTER — Encounter: Admit: 2018-11-05 | Discharge: 2018-11-06 | Payer: MEDICARE

## 2018-11-05 DIAGNOSIS — K130 Diseases of lips: Secondary | ICD-10-CM

## 2018-11-16 MED ORDER — ATORVASTATIN 40 MG TABLET
ORAL_TABLET | Freq: Every day | ORAL | 3 refills | 90.00000 days | Status: CP
Start: 2018-11-16 — End: ?

## 2018-11-27 MED ORDER — PYRIDOXINE (VITAMIN B6) 100 MG TABLET
ORAL_TABLET | Freq: Every day | ORAL | 3 refills | 90.00000 days | Status: CP
Start: 2018-11-27 — End: 2018-11-27

## 2018-11-27 MED ORDER — PYRIDOXINE (VITAMIN B6) 500 MG TABLET
ORAL_TABLET | Freq: Every day | ORAL | 3 refills | 90 days | Status: CP
Start: 2018-11-27 — End: 2019-11-27

## 2018-12-10 MED ORDER — LEVOTHYROXINE 100 MCG TABLET
ORAL_TABLET | Freq: Every day | ORAL | 11 refills | 30.00000 days | Status: CP
Start: 2018-12-10 — End: 2019-12-10

## 2018-12-21 ENCOUNTER — Encounter: Admit: 2018-12-21 | Discharge: 2018-12-22 | Payer: MEDICARE

## 2018-12-21 DIAGNOSIS — Z0000 Encounter for general adult medical examination without abnormal findings: Principal | ICD-10-CM

## 2018-12-29 ENCOUNTER — Encounter: Admit: 2018-12-29 | Discharge: 2018-12-30 | Payer: MEDICARE

## 2018-12-29 DIAGNOSIS — R200 Anesthesia of skin: Principal | ICD-10-CM

## 2019-01-05 ENCOUNTER — Encounter: Admit: 2019-01-05 | Discharge: 2019-01-05 | Payer: MEDICARE

## 2019-01-05 ENCOUNTER — Encounter: Admit: 2019-01-05 | Discharge: 2019-01-05 | Payer: MEDICARE | Attending: Family Medicine | Primary: Family Medicine

## 2019-01-05 DIAGNOSIS — E531 Pyridoxine deficiency: Secondary | ICD-10-CM

## 2019-01-05 DIAGNOSIS — E039 Hypothyroidism, unspecified: Secondary | ICD-10-CM

## 2019-01-05 DIAGNOSIS — R682 Dry mouth, unspecified: Secondary | ICD-10-CM

## 2019-01-05 DIAGNOSIS — K50119 Crohn's disease of large intestine with unspecified complications: Secondary | ICD-10-CM

## 2019-01-14 ENCOUNTER — Encounter: Admit: 2019-01-14 | Discharge: 2019-01-15 | Payer: MEDICARE

## 2019-03-10 DIAGNOSIS — H9313 Tinnitus, bilateral: Principal | ICD-10-CM

## 2019-03-10 DIAGNOSIS — E531 Pyridoxine deficiency: Principal | ICD-10-CM

## 2019-03-10 DIAGNOSIS — F321 Major depressive disorder, single episode, moderate: Principal | ICD-10-CM

## 2019-03-10 DIAGNOSIS — N401 Enlarged prostate with lower urinary tract symptoms: Secondary | ICD-10-CM

## 2019-03-10 DIAGNOSIS — R351 Nocturia: Secondary | ICD-10-CM

## 2019-03-10 DIAGNOSIS — K50119 Crohn's disease of large intestine with unspecified complications: Principal | ICD-10-CM

## 2019-03-10 DIAGNOSIS — R682 Dry mouth, unspecified: Principal | ICD-10-CM

## 2019-03-10 MED ORDER — SERTRALINE 25 MG TABLET
ORAL_TABLET | Freq: Every day | ORAL | 0 refills | 90.00000 days | Status: CP
Start: 2019-03-10 — End: 2020-03-09

## 2019-03-10 MED ORDER — FINASTERIDE 5 MG TABLET
ORAL_TABLET | Freq: Every day | ORAL | 1 refills | 90 days | Status: CP
Start: 2019-03-10 — End: 2020-03-09

## 2019-03-15 ENCOUNTER — Ambulatory Visit: Admit: 2019-03-15 | Discharge: 2019-03-15 | Disposition: A | Discharge status: Home / Self Care | Payer: MEDICARE

## 2019-03-15 ENCOUNTER — Emergency Department: Admit: 2019-03-15 | Discharge: 2019-03-15 | Disposition: A | Discharge status: Home / Self Care | Payer: MEDICARE

## 2019-03-15 DIAGNOSIS — R197 Diarrhea, unspecified: Principal | ICD-10-CM

## 2019-03-15 MED ORDER — ESOMEPRAZOLE MAGNESIUM 40 MG CAPSULE,DELAYED RELEASE
ORAL_CAPSULE | Freq: Every day | ORAL | 3 refills | 90.00000 days | Status: CP
Start: 2019-03-15 — End: 2020-03-14

## 2019-04-13 ENCOUNTER — Encounter: Admit: 2019-04-13 | Discharge: 2019-04-14 | Payer: MEDICARE | Attending: Family Medicine | Primary: Family Medicine

## 2019-04-13 DIAGNOSIS — R351 Nocturia: Principal | ICD-10-CM

## 2019-04-13 DIAGNOSIS — R350 Frequency of micturition: Principal | ICD-10-CM

## 2019-04-13 DIAGNOSIS — N401 Enlarged prostate with lower urinary tract symptoms: Principal | ICD-10-CM

## 2019-04-29 ENCOUNTER — Encounter: Admit: 2019-04-29 | Discharge: 2019-04-29 | Payer: MEDICARE | Attending: Internal Medicine | Primary: Internal Medicine

## 2019-04-29 ENCOUNTER — Encounter: Admit: 2019-04-29 | Discharge: 2019-04-29 | Payer: MEDICARE

## 2019-04-29 MED ORDER — MAGIC MOUTHWASH (NYSTATIN/DIPHENHYDRAMINE/MYLANTA) WITH LIDOCAINE ORAL MIXTURE
Freq: Four times a day (QID) | ORAL | 6 refills | 0 days | Status: CP | PRN
Start: 2019-04-29 — End: 2019-05-29

## 2019-05-06 ENCOUNTER — Encounter: Admit: 2019-05-06 | Discharge: 2019-05-07 | Payer: MEDICARE

## 2019-05-10 MED ORDER — TRAZODONE 50 MG TABLET
ORAL_TABLET | Freq: Every evening | ORAL | 0 refills | 30.00000 days | Status: CP | PRN
Start: 2019-05-10 — End: 2019-06-09

## 2019-06-03 ENCOUNTER — Encounter: Admit: 2019-06-03 | Discharge: 2019-06-04 | Payer: MEDICARE | Attending: Urology | Primary: Urology

## 2019-06-03 DIAGNOSIS — R351 Nocturia: Secondary | ICD-10-CM

## 2019-06-03 DIAGNOSIS — N200 Calculus of kidney: Principal | ICD-10-CM

## 2019-06-03 DIAGNOSIS — N401 Enlarged prostate with lower urinary tract symptoms: Principal | ICD-10-CM

## 2019-06-03 DIAGNOSIS — R350 Frequency of micturition: Principal | ICD-10-CM

## 2019-06-21 ENCOUNTER — Ambulatory Visit
Admit: 2019-06-21 | Discharge: 2019-06-22 | Payer: MEDICARE | Attending: Nurse Practitioner | Primary: Nurse Practitioner

## 2019-06-24 ENCOUNTER — Encounter: Admit: 2019-06-24 | Discharge: 2019-06-24 | Payer: MEDICARE

## 2019-06-28 MED ORDER — TRAZODONE 50 MG TABLET
ORAL_TABLET | Freq: Every evening | ORAL | 0 refills | 30.00000 days | Status: CP | PRN
Start: 2019-06-28 — End: 2019-07-28

## 2019-07-05 MED ORDER — ESOMEPRAZOLE MAGNESIUM 40 MG CAPSULE,DELAYED RELEASE
ORAL_CAPSULE | Freq: Every day | ORAL | 3 refills | 90.00000 days | Status: CP
Start: 2019-07-05 — End: 2020-07-04

## 2019-07-19 ENCOUNTER — Encounter: Admit: 2019-07-19 | Discharge: 2019-07-20 | Payer: MEDICARE | Attending: Family Medicine | Primary: Family Medicine

## 2019-07-19 DIAGNOSIS — R399 Unspecified symptoms and signs involving the genitourinary system: Principal | ICD-10-CM

## 2019-07-19 DIAGNOSIS — G479 Sleep disorder, unspecified: Principal | ICD-10-CM

## 2019-07-19 DIAGNOSIS — K50119 Crohn's disease of large intestine with unspecified complications: Principal | ICD-10-CM

## 2019-07-19 DIAGNOSIS — F321 Major depressive disorder, single episode, moderate: Principal | ICD-10-CM

## 2019-07-19 DIAGNOSIS — E039 Hypothyroidism, unspecified: Principal | ICD-10-CM

## 2019-07-19 DIAGNOSIS — I2699 Other pulmonary embolism without acute cor pulmonale: Principal | ICD-10-CM

## 2019-07-19 DIAGNOSIS — F1111 Opioid abuse, in remission: Principal | ICD-10-CM

## 2019-07-19 DIAGNOSIS — R682 Dry mouth, unspecified: Principal | ICD-10-CM

## 2019-07-19 MED ORDER — SERTRALINE 25 MG TABLET
ORAL_TABLET | Freq: Every day | ORAL | 0 refills | 90.00000 days | Status: CP
Start: 2019-07-19 — End: 2020-07-18

## 2019-07-26 ENCOUNTER — Ambulatory Visit: Admit: 2019-07-26 | Discharge: 2019-07-27 | Payer: MEDICARE | Attending: Dentist | Primary: Dentist

## 2019-07-26 DIAGNOSIS — R682 Dry mouth, unspecified: Principal | ICD-10-CM

## 2019-07-26 DIAGNOSIS — D472 Monoclonal gammopathy: Principal | ICD-10-CM

## 2019-07-26 DIAGNOSIS — F411 Generalized anxiety disorder: Principal | ICD-10-CM

## 2019-07-26 DIAGNOSIS — K146 Glossodynia: Principal | ICD-10-CM

## 2019-07-26 DIAGNOSIS — K0510 Chronic gingivitis, plaque induced: Principal | ICD-10-CM

## 2019-07-26 MED ORDER — PILOCARPINE 5 MG TABLET
ORAL_TABLET | Freq: Three times a day (TID) | ORAL | 11 refills | 30.00000 days | Status: CP
Start: 2019-07-26 — End: 2020-07-25

## 2019-07-27 DIAGNOSIS — D472 Monoclonal gammopathy: Principal | ICD-10-CM

## 2019-08-06 ENCOUNTER — Ambulatory Visit: Admit: 2019-08-06 | Discharge: 2019-08-07 | Payer: MEDICARE

## 2019-08-06 ENCOUNTER — Encounter: Admit: 2019-08-06 | Discharge: 2019-08-07 | Payer: MEDICARE

## 2019-08-06 DIAGNOSIS — D472 Monoclonal gammopathy: Principal | ICD-10-CM

## 2019-08-18 MED ORDER — TRAZODONE 100 MG TABLET
ORAL_TABLET | Freq: Every evening | ORAL | 0 refills | 180.00000 days | Status: CP
Start: 2019-08-18 — End: ?

## 2019-08-24 ENCOUNTER — Ambulatory Visit: Admit: 2019-08-24 | Discharge: 2019-08-25 | Payer: MEDICARE

## 2019-08-24 DIAGNOSIS — D472 Monoclonal gammopathy: Principal | ICD-10-CM

## 2019-09-09 ENCOUNTER — Encounter: Admit: 2019-09-09 | Discharge: 2019-09-10 | Payer: MEDICARE

## 2019-09-09 DIAGNOSIS — R208 Other disturbances of skin sensation: Principal | ICD-10-CM

## 2019-09-09 DIAGNOSIS — L578 Other skin changes due to chronic exposure to nonionizing radiation: Principal | ICD-10-CM

## 2019-09-09 DIAGNOSIS — L821 Other seborrheic keratosis: Principal | ICD-10-CM

## 2019-09-09 DIAGNOSIS — D492 Neoplasm of unspecified behavior of bone, soft tissue, and skin: Principal | ICD-10-CM

## 2019-09-17 ENCOUNTER — Encounter
Admit: 2019-09-17 | Discharge: 2019-09-17 | Disposition: A | Discharge status: Home / Self Care | Payer: MEDICARE | Attending: Family

## 2019-09-17 DIAGNOSIS — R222 Localized swelling, mass and lump, trunk: Principal | ICD-10-CM

## 2019-09-20 ENCOUNTER — Ambulatory Visit: Admit: 2019-09-20 | Discharge: 2019-09-21 | Payer: MEDICARE | Attending: Family Medicine | Primary: Family Medicine

## 2019-09-20 DIAGNOSIS — F419 Anxiety disorder, unspecified: Principal | ICD-10-CM

## 2019-09-20 DIAGNOSIS — R682 Dry mouth, unspecified: Principal | ICD-10-CM

## 2019-09-20 DIAGNOSIS — F329 Major depressive disorder, single episode, unspecified: Principal | ICD-10-CM

## 2019-09-20 DIAGNOSIS — R222 Localized swelling, mass and lump, trunk: Principal | ICD-10-CM

## 2019-09-23 ENCOUNTER — Encounter: Admit: 2019-09-23 | Discharge: 2019-09-24 | Payer: MEDICARE | Attending: Dentist | Primary: Dentist

## 2019-09-23 DIAGNOSIS — K0510 Chronic gingivitis, plaque induced: Principal | ICD-10-CM

## 2019-09-23 DIAGNOSIS — G629 Polyneuropathy, unspecified: Principal | ICD-10-CM

## 2019-09-23 DIAGNOSIS — R682 Dry mouth, unspecified: Principal | ICD-10-CM

## 2019-09-23 DIAGNOSIS — K130 Diseases of lips: Principal | ICD-10-CM

## 2019-09-23 DIAGNOSIS — K146 Glossodynia: Principal | ICD-10-CM

## 2019-09-23 DIAGNOSIS — F411 Generalized anxiety disorder: Principal | ICD-10-CM

## 2019-09-27 ENCOUNTER — Encounter: Admit: 2019-09-27 | Discharge: 2019-09-28 | Payer: MEDICARE

## 2019-09-29 DIAGNOSIS — R222 Localized swelling, mass and lump, trunk: Principal | ICD-10-CM

## 2019-10-06 ENCOUNTER — Ambulatory Visit: Admit: 2019-10-06 | Discharge: 2019-10-07 | Payer: MEDICARE | Attending: Surgical Oncology | Primary: Surgical Oncology

## 2019-10-06 DIAGNOSIS — R222 Localized swelling, mass and lump, trunk: Principal | ICD-10-CM

## 2019-10-06 DIAGNOSIS — K146 Glossodynia: Principal | ICD-10-CM

## 2019-10-06 DIAGNOSIS — G629 Polyneuropathy, unspecified: Principal | ICD-10-CM

## 2019-10-06 MED ORDER — HYDROPHILIC TOPICAL OINTMENT
TOPICAL | 1 refills | 0.00000 days | Status: CP
Start: 2019-10-06 — End: ?

## 2019-10-08 ENCOUNTER — Encounter: Admit: 2019-10-08 | Discharge: 2019-10-08 | Payer: MEDICARE | Attending: Specialist | Primary: Specialist

## 2019-10-08 ENCOUNTER — Encounter: Admit: 2019-10-08 | Discharge: 2019-10-08 | Payer: MEDICARE

## 2019-10-08 MED ORDER — OXYCODONE 5 MG TABLET
ORAL_TABLET | ORAL | 0 refills | 2 days | Status: CP | PRN
Start: 2019-10-08 — End: 2019-10-13
  Filled 2019-10-08: qty 10, 2d supply, fill #0

## 2019-10-08 MED FILL — OXYCODONE 5 MG TABLET: 2 days supply | Qty: 10 | Fill #0 | Status: AC

## 2019-10-19 MED ORDER — OXYCODONE 5 MG TABLET
ORAL_TABLET | ORAL | 0 refills | 2.00000 days | Status: CP | PRN
Start: 2019-10-19 — End: ?

## 2019-10-21 ENCOUNTER — Encounter: Admit: 2019-10-21 | Discharge: 2019-10-22 | Payer: MEDICARE | Attending: Surgical Oncology | Primary: Surgical Oncology

## 2019-10-25 ENCOUNTER — Ambulatory Visit: Admit: 2019-10-25 | Discharge: 2019-10-25 | Payer: MEDICARE | Attending: Adult Health | Primary: Adult Health

## 2019-10-25 ENCOUNTER — Encounter: Admit: 2019-10-25 | Discharge: 2019-10-25 | Payer: MEDICARE

## 2019-10-25 DIAGNOSIS — G3184 Mild cognitive impairment, so stated: Principal | ICD-10-CM

## 2019-10-25 DIAGNOSIS — E039 Hypothyroidism, unspecified: Principal | ICD-10-CM

## 2019-10-25 DIAGNOSIS — R682 Dry mouth, unspecified: Principal | ICD-10-CM

## 2019-10-25 DIAGNOSIS — D538 Other specified nutritional anemias: Principal | ICD-10-CM

## 2019-10-25 DIAGNOSIS — G4700 Insomnia, unspecified: Principal | ICD-10-CM

## 2019-10-25 DIAGNOSIS — F419 Anxiety disorder, unspecified: Principal | ICD-10-CM

## 2019-10-25 DIAGNOSIS — F329 Major depressive disorder, single episode, unspecified: Principal | ICD-10-CM

## 2019-10-25 MED ORDER — SERTRALINE 25 MG TABLET
ORAL_TABLET | 4 refills | 0.00000 days | Status: CP
Start: 2019-10-25 — End: 2019-10-25

## 2019-10-25 MED ORDER — TRAZODONE 100 MG TABLET
ORAL_TABLET | Freq: Every evening | ORAL | 3 refills | 90.00000 days | Status: CP
Start: 2019-10-25 — End: ?

## 2019-10-25 MED ORDER — DONEPEZIL 5 MG TABLET
ORAL_TABLET | Freq: Every day | ORAL | 2 refills | 180.00000 days | Status: CP
Start: 2019-10-25 — End: 2021-04-17

## 2019-10-25 MED ORDER — SERTRALINE 25 MG TABLET: tablet | 4 refills | 0 days | Status: AC

## 2019-10-26 MED ORDER — APIXABAN 5 MG TABLET
ORAL_TABLET | Freq: Two times a day (BID) | ORAL | 3 refills | 90 days | Status: CP
Start: 2019-10-26 — End: ?

## 2019-11-09 ENCOUNTER — Ambulatory Visit: Admit: 2019-11-09 | Discharge: 2019-11-10 | Payer: MEDICARE

## 2019-11-09 DIAGNOSIS — D472 Monoclonal gammopathy: Principal | ICD-10-CM

## 2019-11-24 ENCOUNTER — Encounter: Admit: 2019-11-24 | Discharge: 2019-11-25 | Payer: MEDICARE | Attending: Family Medicine | Primary: Family Medicine

## 2019-11-24 DIAGNOSIS — R350 Frequency of micturition: Principal | ICD-10-CM

## 2019-11-24 DIAGNOSIS — M549 Dorsalgia, unspecified: Principal | ICD-10-CM

## 2019-11-24 DIAGNOSIS — F329 Major depressive disorder, single episode, unspecified: Secondary | ICD-10-CM

## 2019-11-24 DIAGNOSIS — F419 Anxiety disorder, unspecified: Principal | ICD-10-CM

## 2019-11-24 DIAGNOSIS — R682 Dry mouth, unspecified: Principal | ICD-10-CM

## 2019-11-24 DIAGNOSIS — F321 Major depressive disorder, single episode, moderate: Principal | ICD-10-CM

## 2019-11-24 DIAGNOSIS — G4700 Insomnia, unspecified: Principal | ICD-10-CM

## 2019-11-24 DIAGNOSIS — E039 Hypothyroidism, unspecified: Principal | ICD-10-CM

## 2019-11-24 MED ORDER — ATORVASTATIN 40 MG TABLET: 40 mg | tablet | Freq: Every day | 3 refills | 90 days | Status: AC

## 2019-11-24 MED ORDER — LEVOTHYROXINE 100 MCG TABLET
ORAL_TABLET | Freq: Every day | ORAL | 11 refills | 30.00000 days | Status: CP
Start: 2019-11-24 — End: 2020-11-23

## 2019-11-24 MED ORDER — SERTRALINE 50 MG TABLET
ORAL_TABLET | Freq: Every day | ORAL | 0 refills | 90.00000 days | Status: CP
Start: 2019-11-24 — End: 2019-11-24

## 2019-11-24 MED ORDER — ATORVASTATIN 40 MG TABLET
ORAL_TABLET | Freq: Every day | ORAL | 3 refills | 90.00000 days | Status: CP
Start: 2019-11-24 — End: 2019-11-24

## 2019-11-24 MED ORDER — TRAZODONE 100 MG TABLET: 100 mg | tablet | Freq: Every evening | 3 refills | 90 days | Status: AC

## 2019-11-24 MED ORDER — SERTRALINE 50 MG TABLET: 50 mg | tablet | Freq: Every day | 0 refills | 90 days | Status: AC

## 2019-11-24 MED ORDER — TRAZODONE 100 MG TABLET
ORAL_TABLET | Freq: Every evening | ORAL | 3 refills | 90.00000 days | Status: CP
Start: 2019-11-24 — End: 2019-11-24

## 2019-11-24 MED ORDER — LEVOTHYROXINE 100 MCG TABLET: 100 ug | tablet | Freq: Every day | 11 refills | 30 days | Status: AC

## 2019-12-01 MED ORDER — PRAMIPEXOLE 0.125 MG TABLET
ORAL_TABLET | Freq: Every evening | ORAL | 2 refills | 30.00000 days | Status: CP
Start: 2019-12-01 — End: 2020-11-30

## 2019-12-27 ENCOUNTER — Ambulatory Visit: Admit: 2019-12-27 | Discharge: 2019-12-28 | Payer: MEDICARE | Attending: Family Medicine | Primary: Family Medicine

## 2019-12-27 DIAGNOSIS — R351 Nocturia: Secondary | ICD-10-CM

## 2019-12-27 DIAGNOSIS — N401 Enlarged prostate with lower urinary tract symptoms: Principal | ICD-10-CM

## 2019-12-27 DIAGNOSIS — F329 Major depressive disorder, single episode, unspecified: Principal | ICD-10-CM

## 2019-12-27 DIAGNOSIS — F321 Major depressive disorder, single episode, moderate: Principal | ICD-10-CM

## 2019-12-27 DIAGNOSIS — F419 Anxiety disorder, unspecified: Secondary | ICD-10-CM

## 2019-12-27 DIAGNOSIS — R682 Dry mouth, unspecified: Principal | ICD-10-CM

## 2019-12-27 MED ORDER — SERTRALINE 100 MG TABLET: 100 mg | tablet | Freq: Every day | 1 refills | 90 days | Status: AC

## 2019-12-27 MED ORDER — SERTRALINE 100 MG TABLET
ORAL_TABLET | Freq: Every day | ORAL | 1 refills | 90 days | Status: CP
Start: 2019-12-27 — End: 2019-12-27

## 2020-01-06 ENCOUNTER — Encounter: Admit: 2020-01-06 | Discharge: 2020-01-07 | Payer: MEDICARE | Attending: Adult Health | Primary: Adult Health

## 2020-01-06 DIAGNOSIS — F329 Major depressive disorder, single episode, unspecified: Principal | ICD-10-CM

## 2020-01-06 DIAGNOSIS — F419 Anxiety disorder, unspecified: Principal | ICD-10-CM

## 2020-01-06 DIAGNOSIS — R682 Dry mouth, unspecified: Principal | ICD-10-CM

## 2020-01-06 DIAGNOSIS — G3184 Mild cognitive impairment, so stated: Principal | ICD-10-CM

## 2020-01-06 DIAGNOSIS — L438 Other lichen planus: Principal | ICD-10-CM

## 2020-01-06 DIAGNOSIS — R350 Frequency of micturition: Principal | ICD-10-CM

## 2020-01-06 DIAGNOSIS — G4733 Obstructive sleep apnea (adult) (pediatric): Principal | ICD-10-CM

## 2020-01-06 MED ORDER — FINASTERIDE 5 MG TABLET
ORAL_TABLET | Freq: Every day | ORAL | 3 refills | 90 days | Status: CP
Start: 2020-01-06 — End: 2021-01-05

## 2020-01-11 DIAGNOSIS — E039 Hypothyroidism, unspecified: Principal | ICD-10-CM

## 2020-01-11 MED ORDER — LEVOTHYROXINE 100 MCG TABLET
ORAL_TABLET | Freq: Every day | ORAL | 11 refills | 30.00000 days | Status: CP
Start: 2020-01-11 — End: 2021-01-10

## 2020-02-07 MED ORDER — APIXABAN 5 MG TABLET
ORAL_TABLET | Freq: Two times a day (BID) | ORAL | 3 refills | 90.00000 days | Status: CP
Start: 2020-02-07 — End: ?
  Filled 2020-04-13: qty 60, 30d supply, fill #0

## 2020-02-08 MED ORDER — APIXABAN 5 MG TABLET
ORAL_TABLET | Freq: Two times a day (BID) | ORAL | 3 refills | 90.00000 days | Status: CP
Start: 2020-02-08 — End: ?

## 2020-02-18 MED ORDER — ATORVASTATIN 40 MG TABLET
ORAL_TABLET | Freq: Every day | ORAL | 3 refills | 90.00000 days | Status: CP
Start: 2020-02-18 — End: ?

## 2020-02-21 ENCOUNTER — Encounter: Admit: 2020-02-21 | Discharge: 2020-02-22 | Payer: MEDICARE | Attending: Adult Health | Primary: Adult Health

## 2020-02-24 ENCOUNTER — Encounter: Admit: 2020-02-24 | Discharge: 2020-02-25 | Payer: MEDICARE | Attending: Urology | Primary: Urology

## 2020-02-24 DIAGNOSIS — R319 Hematuria, unspecified: Principal | ICD-10-CM

## 2020-02-24 DIAGNOSIS — N401 Enlarged prostate with lower urinary tract symptoms: Principal | ICD-10-CM

## 2020-03-09 ENCOUNTER — Encounter: Admit: 2020-03-09 | Discharge: 2020-03-10 | Payer: MEDICARE

## 2020-03-09 ENCOUNTER — Ambulatory Visit: Admit: 2020-03-09 | Discharge: 2020-03-10 | Payer: MEDICARE | Attending: Urology | Primary: Urology

## 2020-03-09 DIAGNOSIS — Z86718 Personal history of other venous thrombosis and embolism: Principal | ICD-10-CM

## 2020-03-09 DIAGNOSIS — N3289 Other specified disorders of bladder: Principal | ICD-10-CM

## 2020-03-09 DIAGNOSIS — Z86711 Personal history of pulmonary embolism: Principal | ICD-10-CM

## 2020-03-09 DIAGNOSIS — Z01818 Encounter for other preprocedural examination: Principal | ICD-10-CM

## 2020-03-09 DIAGNOSIS — Z79899 Other long term (current) drug therapy: Principal | ICD-10-CM

## 2020-03-09 DIAGNOSIS — R319 Hematuria, unspecified: Principal | ICD-10-CM

## 2020-03-09 DIAGNOSIS — N138 Other obstructive and reflux uropathy: Principal | ICD-10-CM

## 2020-03-09 DIAGNOSIS — Z96641 Presence of right artificial hip joint: Principal | ICD-10-CM

## 2020-03-09 DIAGNOSIS — Z79890 Hormone replacement therapy (postmenopausal): Principal | ICD-10-CM

## 2020-03-09 DIAGNOSIS — N320 Bladder-neck obstruction: Principal | ICD-10-CM

## 2020-03-09 DIAGNOSIS — N401 Enlarged prostate with lower urinary tract symptoms: Principal | ICD-10-CM

## 2020-03-09 DIAGNOSIS — G3184 Mild cognitive impairment, so stated: Principal | ICD-10-CM

## 2020-03-09 DIAGNOSIS — Z8719 Personal history of other diseases of the digestive system: Principal | ICD-10-CM

## 2020-03-09 DIAGNOSIS — Z9049 Acquired absence of other specified parts of digestive tract: Principal | ICD-10-CM

## 2020-03-09 DIAGNOSIS — Z7901 Long term (current) use of anticoagulants: Principal | ICD-10-CM

## 2020-03-09 DIAGNOSIS — F419 Anxiety disorder, unspecified: Principal | ICD-10-CM

## 2020-03-09 DIAGNOSIS — N35919 Unspecified urethral stricture, male, unspecified site: Principal | ICD-10-CM

## 2020-03-09 DIAGNOSIS — G4733 Obstructive sleep apnea (adult) (pediatric): Principal | ICD-10-CM

## 2020-03-09 DIAGNOSIS — N3021 Other chronic cystitis with hematuria: Principal | ICD-10-CM

## 2020-03-09 DIAGNOSIS — E039 Hypothyroidism, unspecified: Principal | ICD-10-CM

## 2020-04-03 DIAGNOSIS — R399 Unspecified symptoms and signs involving the genitourinary system: Principal | ICD-10-CM

## 2020-04-04 ENCOUNTER — Other Ambulatory Visit: Admit: 2020-04-04 | Discharge: 2020-04-05 | Payer: MEDICARE

## 2020-04-04 DIAGNOSIS — R399 Unspecified symptoms and signs involving the genitourinary system: Principal | ICD-10-CM

## 2020-04-11 ENCOUNTER — Ambulatory Visit
Admit: 2020-04-11 | Discharge: 2020-04-12 | Payer: MEDICARE | Attending: Physician Assistant | Primary: Physician Assistant

## 2020-04-11 DIAGNOSIS — N3001 Acute cystitis with hematuria: Principal | ICD-10-CM

## 2020-04-11 DIAGNOSIS — Z20822 Encounter for preprocedure screening laboratory testing for COVID-19: Principal | ICD-10-CM

## 2020-04-11 DIAGNOSIS — Z01812 Encounter for preprocedural laboratory examination: Principal | ICD-10-CM

## 2020-04-11 MED ORDER — AMOXICILLIN 875 MG-POTASSIUM CLAVULANATE 125 MG TABLET
ORAL_TABLET | Freq: Two times a day (BID) | ORAL | 0 refills | 10.00000 days | Status: CP
Start: 2020-04-11 — End: 2020-04-24

## 2020-04-12 DIAGNOSIS — F419 Anxiety disorder, unspecified: Principal | ICD-10-CM

## 2020-04-12 DIAGNOSIS — R319 Hematuria, unspecified: Principal | ICD-10-CM

## 2020-04-12 DIAGNOSIS — N3289 Other specified disorders of bladder: Principal | ICD-10-CM

## 2020-04-12 DIAGNOSIS — E039 Hypothyroidism, unspecified: Principal | ICD-10-CM

## 2020-04-12 DIAGNOSIS — Z79890 Hormone replacement therapy (postmenopausal): Principal | ICD-10-CM

## 2020-04-12 DIAGNOSIS — Z86718 Personal history of other venous thrombosis and embolism: Principal | ICD-10-CM

## 2020-04-12 DIAGNOSIS — Z79899 Other long term (current) drug therapy: Principal | ICD-10-CM

## 2020-04-12 DIAGNOSIS — Z96641 Presence of right artificial hip joint: Principal | ICD-10-CM

## 2020-04-12 DIAGNOSIS — Z9049 Acquired absence of other specified parts of digestive tract: Principal | ICD-10-CM

## 2020-04-12 DIAGNOSIS — G4733 Obstructive sleep apnea (adult) (pediatric): Principal | ICD-10-CM

## 2020-04-12 DIAGNOSIS — G3184 Mild cognitive impairment, so stated: Principal | ICD-10-CM

## 2020-04-12 DIAGNOSIS — Z86711 Personal history of pulmonary embolism: Principal | ICD-10-CM

## 2020-04-12 DIAGNOSIS — Z7901 Long term (current) use of anticoagulants: Principal | ICD-10-CM

## 2020-04-12 DIAGNOSIS — N3021 Other chronic cystitis with hematuria: Principal | ICD-10-CM

## 2020-04-12 DIAGNOSIS — Z8719 Personal history of other diseases of the digestive system: Principal | ICD-10-CM

## 2020-04-13 ENCOUNTER — Encounter
Admit: 2020-04-13 | Discharge: 2020-04-13 | Payer: MEDICARE | Attending: Student in an Organized Health Care Education/Training Program | Primary: Student in an Organized Health Care Education/Training Program

## 2020-04-13 ENCOUNTER — Ambulatory Visit: Admit: 2020-04-13 | Discharge: 2020-04-13 | Payer: MEDICARE

## 2020-04-13 DIAGNOSIS — E039 Hypothyroidism, unspecified: Principal | ICD-10-CM

## 2020-04-13 DIAGNOSIS — N3021 Other chronic cystitis with hematuria: Principal | ICD-10-CM

## 2020-04-13 DIAGNOSIS — Z9049 Acquired absence of other specified parts of digestive tract: Principal | ICD-10-CM

## 2020-04-13 DIAGNOSIS — Z79899 Other long term (current) drug therapy: Principal | ICD-10-CM

## 2020-04-13 DIAGNOSIS — N3289 Other specified disorders of bladder: Principal | ICD-10-CM

## 2020-04-13 DIAGNOSIS — Z96641 Presence of right artificial hip joint: Principal | ICD-10-CM

## 2020-04-13 DIAGNOSIS — Z8719 Personal history of other diseases of the digestive system: Principal | ICD-10-CM

## 2020-04-13 DIAGNOSIS — Z7901 Long term (current) use of anticoagulants: Principal | ICD-10-CM

## 2020-04-13 DIAGNOSIS — F419 Anxiety disorder, unspecified: Principal | ICD-10-CM

## 2020-04-13 DIAGNOSIS — G4733 Obstructive sleep apnea (adult) (pediatric): Principal | ICD-10-CM

## 2020-04-13 DIAGNOSIS — G3184 Mild cognitive impairment, so stated: Principal | ICD-10-CM

## 2020-04-13 DIAGNOSIS — Z86711 Personal history of pulmonary embolism: Principal | ICD-10-CM

## 2020-04-13 DIAGNOSIS — Z79890 Hormone replacement therapy (postmenopausal): Principal | ICD-10-CM

## 2020-04-13 DIAGNOSIS — Z86718 Personal history of other venous thrombosis and embolism: Principal | ICD-10-CM

## 2020-04-13 MED ORDER — TRAMADOL 50 MG TABLET
ORAL_TABLET | Freq: Four times a day (QID) | ORAL | 0 refills | 3.00000 days | Status: CP | PRN
Start: 2020-04-13 — End: 2020-04-18
  Filled 2020-04-13: qty 10, 3d supply, fill #0

## 2020-04-13 MED ORDER — SENNOSIDES 8.6 MG TABLET
ORAL_TABLET | Freq: Every day | ORAL | 0 refills | 7.00000 days | Status: CP
Start: 2020-04-13 — End: 2020-04-20
  Filled 2020-04-13: qty 14, 7d supply, fill #0

## 2020-04-13 MED ORDER — DOCUSATE SODIUM 100 MG CAPSULE
ORAL_CAPSULE | Freq: Two times a day (BID) | ORAL | 0 refills | 7.00000 days | Status: CP
Start: 2020-04-13 — End: 2020-04-20
  Filled 2020-04-13: qty 14, 7d supply, fill #0

## 2020-04-13 MED FILL — ELIQUIS 5 MG TABLET: 30 days supply | Qty: 60 | Fill #0 | Status: AC

## 2020-04-13 MED FILL — TRAMADOL 50 MG TABLET: 3 days supply | Qty: 10 | Fill #0 | Status: AC

## 2020-04-13 MED FILL — SENNA 8.6 MG TABLET: 7 days supply | Qty: 14 | Fill #0 | Status: AC

## 2020-04-13 MED FILL — DOCUSATE SODIUM 100 MG CAPSULE: 7 days supply | Qty: 14 | Fill #0 | Status: AC

## 2020-04-17 ENCOUNTER — Institutional Professional Consult (permissible substitution): Admit: 2020-04-17 | Discharge: 2020-04-17 | Payer: MEDICARE

## 2020-04-17 DIAGNOSIS — N320 Bladder-neck obstruction: Principal | ICD-10-CM

## 2020-04-17 MED ORDER — APIXABAN 5 MG TABLET
ORAL_TABLET | Freq: Two times a day (BID) | ORAL | 3 refills | 90 days | Status: CP
Start: 2020-04-17 — End: ?

## 2020-04-20 ENCOUNTER — Ambulatory Visit: Admit: 2020-04-20 | Discharge: 2020-04-21 | Payer: MEDICARE

## 2020-04-20 DIAGNOSIS — G5793 Unspecified mononeuropathy of bilateral lower limbs: Principal | ICD-10-CM

## 2020-04-20 DIAGNOSIS — Q667 Congenital pes cavus, unspecified foot: Principal | ICD-10-CM

## 2020-04-24 ENCOUNTER — Ambulatory Visit: Admit: 2020-04-24 | Discharge: 2020-04-25 | Payer: MEDICARE

## 2020-04-24 DIAGNOSIS — E785 Hyperlipidemia, unspecified: Principal | ICD-10-CM

## 2020-04-24 DIAGNOSIS — R399 Unspecified symptoms and signs involving the genitourinary system: Principal | ICD-10-CM

## 2020-04-24 DIAGNOSIS — R3129 Other microscopic hematuria: Principal | ICD-10-CM

## 2020-04-24 DIAGNOSIS — I82531 Chronic embolism and thrombosis of right popliteal vein: Principal | ICD-10-CM

## 2020-04-24 DIAGNOSIS — F32A Anxiety and depression: Principal | ICD-10-CM

## 2020-04-24 DIAGNOSIS — R413 Other amnesia: Principal | ICD-10-CM

## 2020-04-24 DIAGNOSIS — K50119 Crohn's disease of large intestine with unspecified complications: Principal | ICD-10-CM

## 2020-04-24 DIAGNOSIS — D472 Monoclonal gammopathy: Principal | ICD-10-CM

## 2020-04-24 DIAGNOSIS — H409 Unspecified glaucoma: Principal | ICD-10-CM

## 2020-04-24 DIAGNOSIS — G4700 Insomnia, unspecified: Principal | ICD-10-CM

## 2020-04-24 DIAGNOSIS — E039 Hypothyroidism, unspecified: Principal | ICD-10-CM

## 2020-04-24 DIAGNOSIS — E8809 Other disorders of plasma-protein metabolism, not elsewhere classified: Principal | ICD-10-CM

## 2020-04-24 DIAGNOSIS — F419 Anxiety disorder, unspecified: Principal | ICD-10-CM

## 2020-04-24 DIAGNOSIS — N320 Bladder-neck obstruction: Principal | ICD-10-CM

## 2020-04-24 MED ORDER — GABAPENTIN 100 MG CAPSULE
ORAL_CAPSULE | 3 refills | 0 days | Status: CP
Start: 2020-04-24 — End: 2020-06-14

## 2020-05-02 ENCOUNTER — Ambulatory Visit: Admit: 2020-05-02 | Discharge: 2020-05-03 | Payer: MEDICARE

## 2020-05-02 DIAGNOSIS — Z86718 Personal history of other venous thrombosis and embolism: Principal | ICD-10-CM

## 2020-05-02 DIAGNOSIS — Z79890 Hormone replacement therapy (postmenopausal): Principal | ICD-10-CM

## 2020-05-02 DIAGNOSIS — R351 Nocturia: Principal | ICD-10-CM

## 2020-05-02 DIAGNOSIS — N401 Enlarged prostate with lower urinary tract symptoms: Principal | ICD-10-CM

## 2020-05-02 DIAGNOSIS — G629 Polyneuropathy, unspecified: Principal | ICD-10-CM

## 2020-05-02 DIAGNOSIS — Z79899 Other long term (current) drug therapy: Principal | ICD-10-CM

## 2020-05-02 DIAGNOSIS — E039 Hypothyroidism, unspecified: Principal | ICD-10-CM

## 2020-05-02 DIAGNOSIS — D472 Monoclonal gammopathy: Principal | ICD-10-CM

## 2020-05-02 DIAGNOSIS — Z7901 Long term (current) use of anticoagulants: Principal | ICD-10-CM

## 2020-05-02 DIAGNOSIS — K5090 Crohn's disease, unspecified, without complications: Principal | ICD-10-CM

## 2020-05-02 DIAGNOSIS — L439 Lichen planus, unspecified: Principal | ICD-10-CM

## 2020-05-02 DIAGNOSIS — R300 Dysuria: Principal | ICD-10-CM

## 2020-05-18 ENCOUNTER — Ambulatory Visit: Admit: 2020-05-18 | Discharge: 2020-05-19 | Payer: MEDICARE | Attending: Urology | Primary: Urology

## 2020-05-18 DIAGNOSIS — N402 Nodular prostate without lower urinary tract symptoms: Principal | ICD-10-CM

## 2020-05-18 DIAGNOSIS — N3941 Urge incontinence: Principal | ICD-10-CM

## 2020-05-18 DIAGNOSIS — N401 Enlarged prostate with lower urinary tract symptoms: Principal | ICD-10-CM

## 2020-05-18 DIAGNOSIS — R1032 Left lower quadrant pain: Principal | ICD-10-CM

## 2020-05-18 DIAGNOSIS — E079 Disorder of thyroid, unspecified: Principal | ICD-10-CM

## 2020-05-18 DIAGNOSIS — Z7901 Long term (current) use of anticoagulants: Principal | ICD-10-CM

## 2020-05-18 DIAGNOSIS — N200 Calculus of kidney: Principal | ICD-10-CM

## 2020-05-18 DIAGNOSIS — N2889 Other specified disorders of kidney and ureter: Principal | ICD-10-CM

## 2020-05-18 MED ORDER — MIRABEGRON ER 50 MG TABLET,EXTENDED RELEASE 24 HR
ORAL_TABLET | Freq: Every day | ORAL | 3 refills | 90 days | Status: CP
Start: 2020-05-18 — End: ?

## 2020-05-20 DIAGNOSIS — F32A Anxiety and depression: Principal | ICD-10-CM

## 2020-05-20 DIAGNOSIS — R682 Dry mouth, unspecified: Principal | ICD-10-CM

## 2020-05-20 DIAGNOSIS — F419 Anxiety disorder, unspecified: Principal | ICD-10-CM

## 2020-05-20 DIAGNOSIS — N3000 Acute cystitis without hematuria: Principal | ICD-10-CM

## 2020-05-20 DIAGNOSIS — F321 Major depressive disorder, single episode, moderate: Principal | ICD-10-CM

## 2020-05-20 MED ORDER — AMOXICILLIN 875 MG-POTASSIUM CLAVULANATE 125 MG TABLET
ORAL_TABLET | Freq: Two times a day (BID) | ORAL | 0 refills | 10.00000 days | Status: CP
Start: 2020-05-20 — End: 2020-05-30

## 2020-05-23 MED ORDER — SERTRALINE 100 MG TABLET
ORAL_TABLET | Freq: Every day | ORAL | 1 refills | 90 days | Status: CP
Start: 2020-05-23 — End: 2021-05-23

## 2020-06-01 ENCOUNTER — Other Ambulatory Visit: Payer: Self-pay | Admitting: Physician Assistant

## 2020-06-01 DIAGNOSIS — H90A21 Sensorineural hearing loss, unilateral, right ear, with restricted hearing on the contralateral side: Secondary | ICD-10-CM

## 2020-06-14 DIAGNOSIS — E8809 Other disorders of plasma-protein metabolism, not elsewhere classified: Principal | ICD-10-CM

## 2020-06-14 MED ORDER — GABAPENTIN 100 MG CAPSULE
ORAL_CAPSULE | 3 refills | 0 days | Status: CP
Start: 2020-06-14 — End: 2021-06-14

## 2020-06-15 ENCOUNTER — Institutional Professional Consult (permissible substitution): Admit: 2020-06-15 | Discharge: 2020-06-15 | Payer: MEDICARE

## 2020-06-15 ENCOUNTER — Ambulatory Visit
Admission: RE | Admit: 2020-06-15 | Discharge: 2020-06-15 | Disposition: A | Discharge status: Home / Self Care | Payer: Medicare Other | Source: Ambulatory Visit | Attending: Physician Assistant | Admitting: Physician Assistant

## 2020-06-15 ENCOUNTER — Other Ambulatory Visit: Payer: Self-pay

## 2020-06-15 DIAGNOSIS — Z79899 Other long term (current) drug therapy: Principal | ICD-10-CM

## 2020-06-15 DIAGNOSIS — Z0000 Encounter for general adult medical examination without abnormal findings: Principal | ICD-10-CM

## 2020-06-15 DIAGNOSIS — H90A21 Sensorineural hearing loss, unilateral, right ear, with restricted hearing on the contralateral side: Secondary | ICD-10-CM | POA: Insufficient documentation

## 2020-06-15 IMAGING — MR MR BRAIN/IAC WO/W CM
12 of 15 series · 30 of 48 positions shown · IV contrast (gadavist)
Comparison: None

CLINICAL DATA: Bilateral tinnitus.  Decreased hearing right ear.

EXAM:
MRI HEAD WITHOUT AND WITH CONTRAST
TECHNIQUE: Multiplanar, multiecho pulse sequences of the brain and surrounding
structures were obtained without and with intravenous contrast.
CONTRAST:  8mL GADAVIST GADOBUTROL 1 MMOL/ML IV SOLN

[Series 5: T1 · sagittal · 5.0mm · 0.62mm/px · 1 of 25 slices shown (1 of 3)]
[im 1/25]
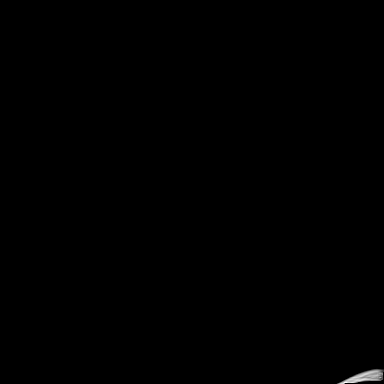

[Series 6: ax dwi_tracew · axial · 3.0mm · 0.60mm/px · z∈[-58,+94]mm · 2 of 48 slices shown]
[im 1/48]
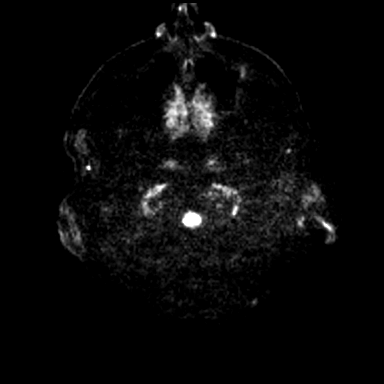
[im 48/48]
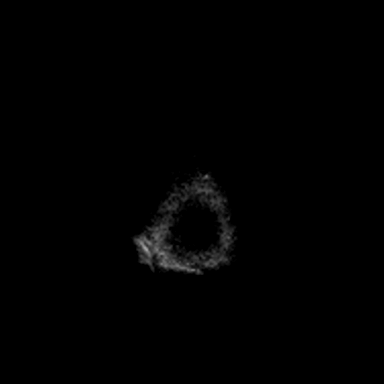

[Series 7: ax dwi_adc · axial · 3.0mm · 0.60mm/px · z∈[-58,+94]mm · 3 of 48 slices shown]
[im 1/48]
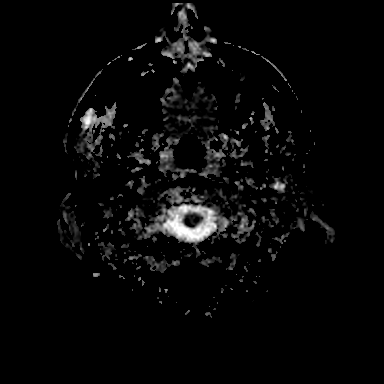
[im 24/48]
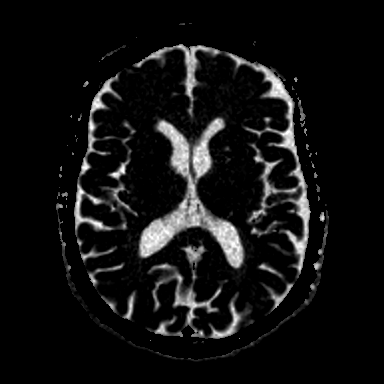
[im 48/48]
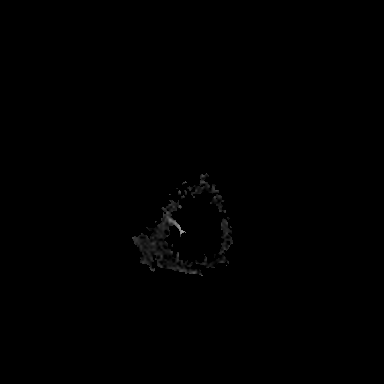

[Series 8: T2 · axial · 5.0mm · 0.53mm/px · z∈[-51,+90]mm · 2 of 25 slices shown]
[im 1/25]
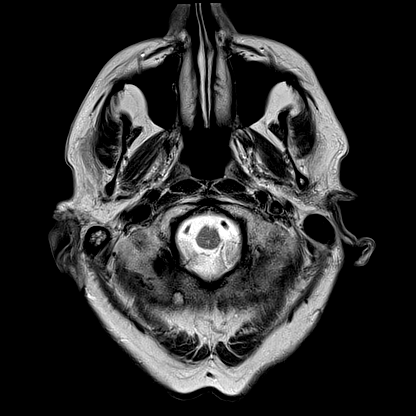
[im 25/25]
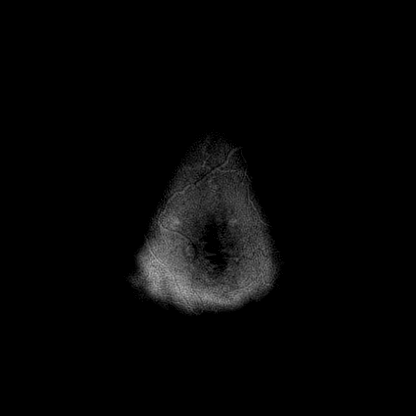

[Series 9: T1 · coronal · non-contrast · 3.0mm · 0.21mm/px · 1 of 13 slices shown (2 of 3)]
[im 1/13]
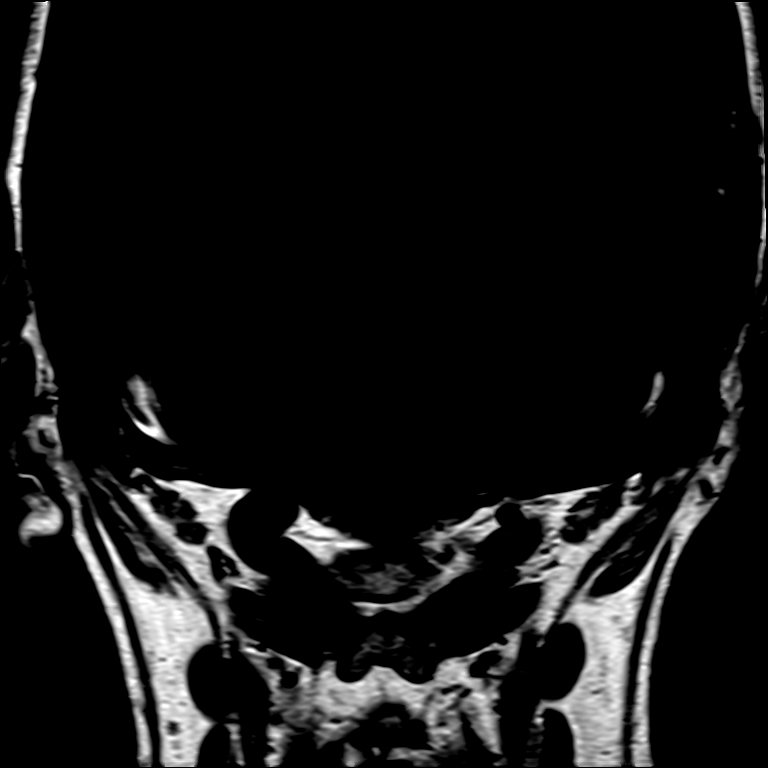

[Series 10: mag_images · axial · 3.0mm · 0.90mm/px · z∈[-67,+107]mm · 4 of 60 slices shown]
[im 1/60]
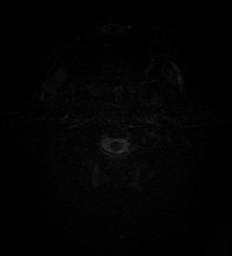
[im 20/60]
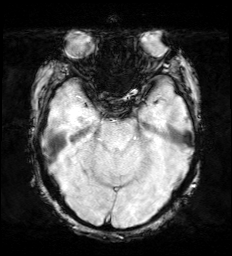
[im 40/60]
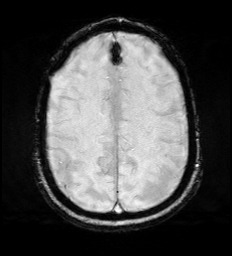
[im 60/60]
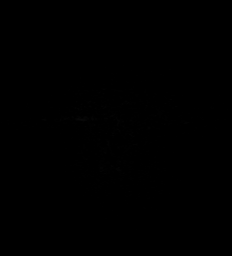

[Series 11: pha_images · axial · 3.0mm · 0.90mm/px · 1 of 60 slices shown]
[im 1/60]
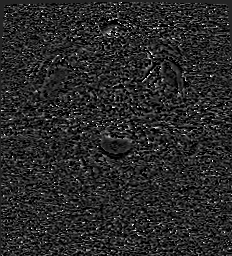

[Series 14: FLAIR · axial · 3.0mm · 0.53mm/px · z∈[-60,+98]mm · 4 of 55 slices shown]
[im 1/55]
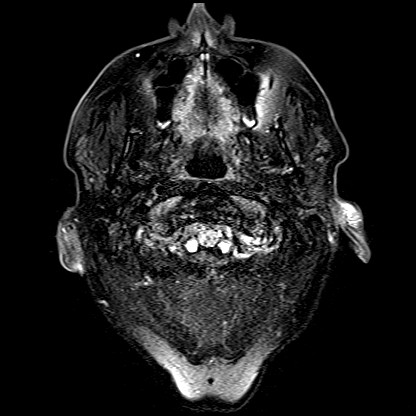
[im 19/55]
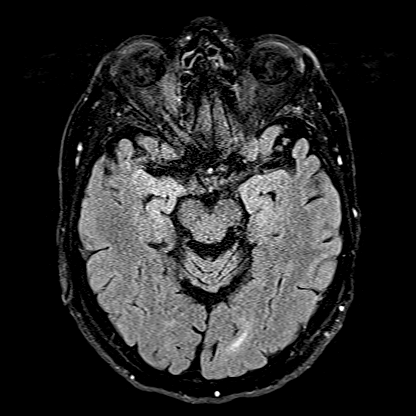
[im 37/55]
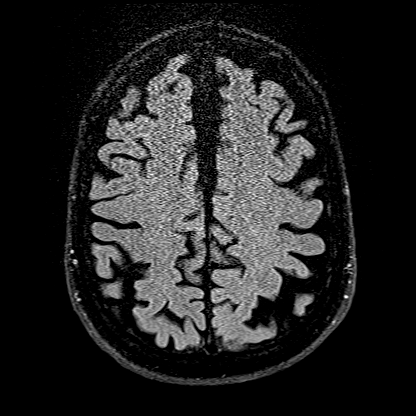
[im 55/55]
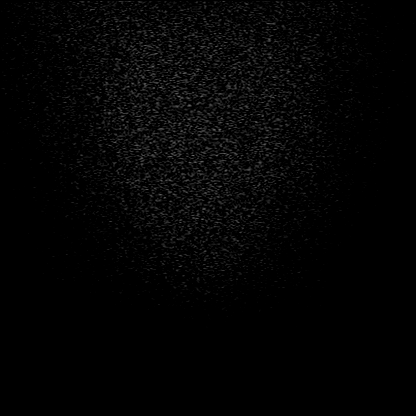

[Series 16: T1 · axial · non-contrast · 3.0mm · 0.21mm/px · 1 of 15 slices shown (3 of 3)]
[im 1/15]
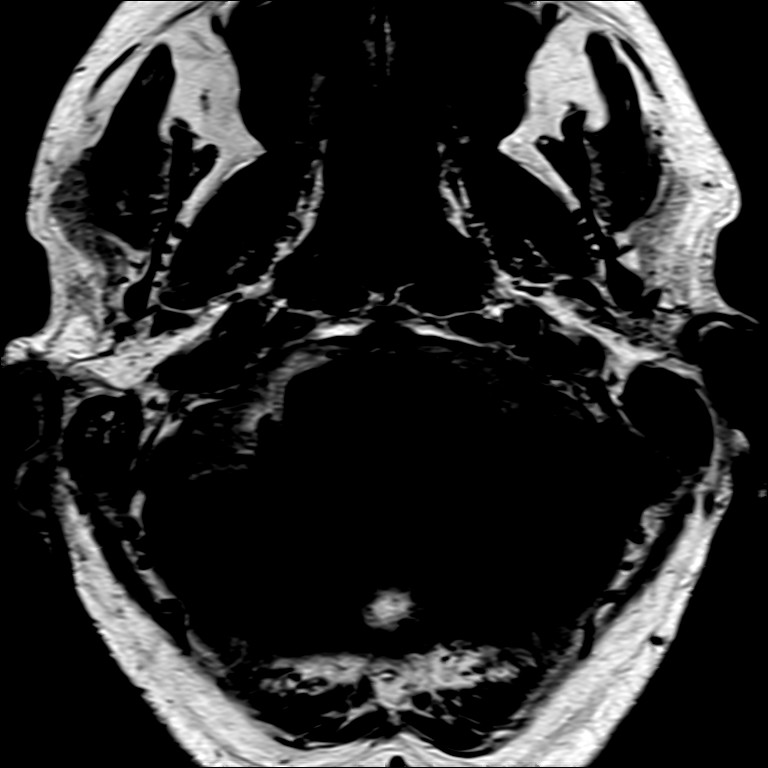

[Series 17: T1 post-contrast · axial · 3.0mm · 0.21mm/px · 1 of 15 slices shown (1 of 3)]
[im 1/15]
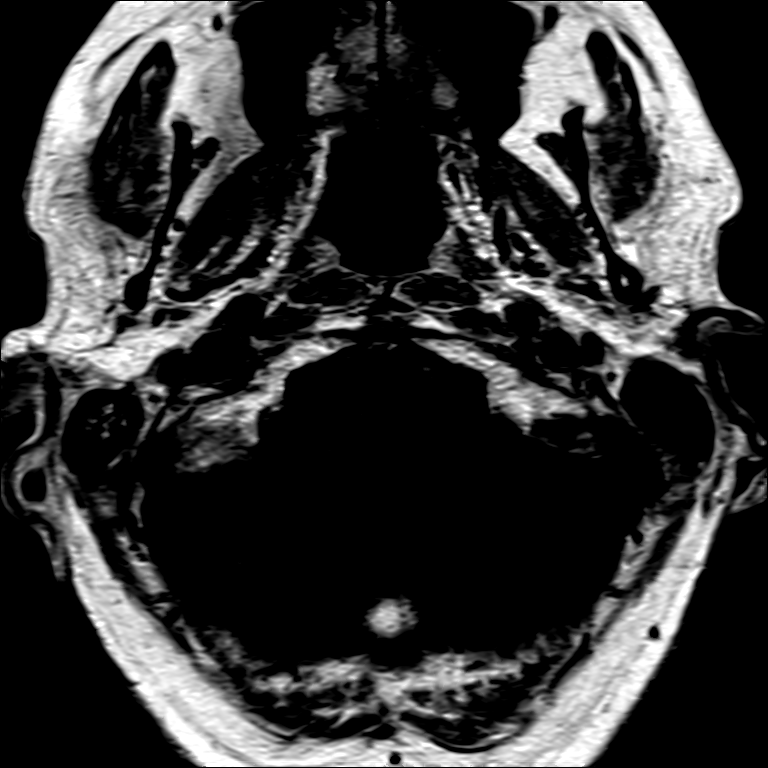

[Series 18: T1 post-contrast · coronal · 3.0mm · 0.21mm/px · 1 of 13 slices shown (2 of 3)]
[im 1/13]
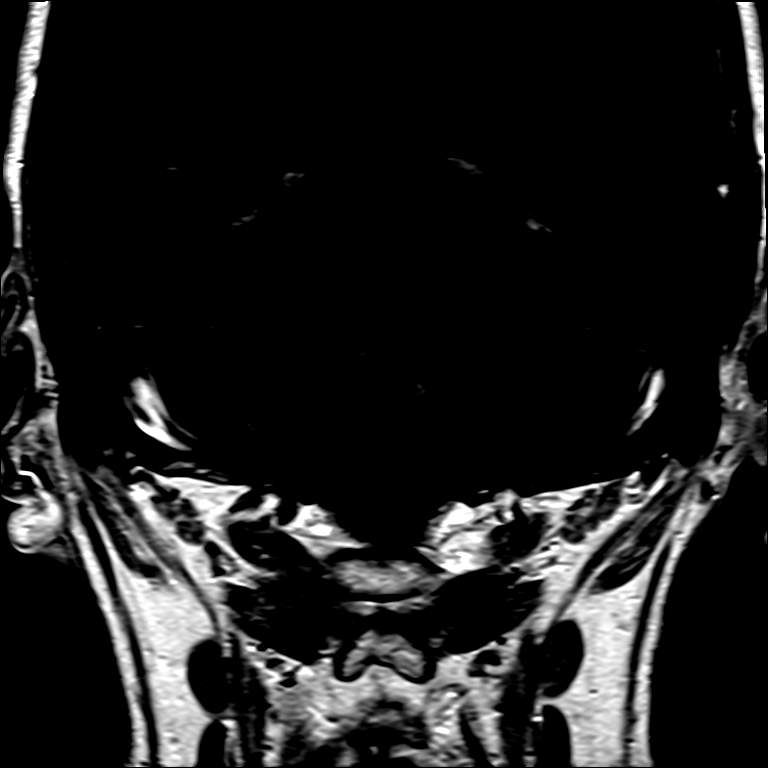

[Series 19: T1 post-contrast · axial · 1.0mm · 0.98mm/px · z∈[-63,+108]mm · 9 of 176 slices shown (3 of 3)]
[im 1/176]
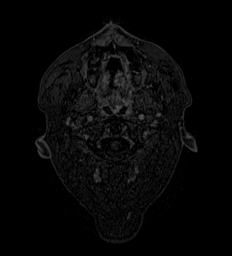
[im 32/176]
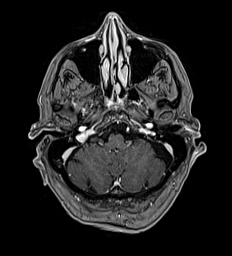
[im 48/176]
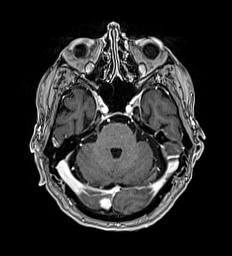
[im 80/176]
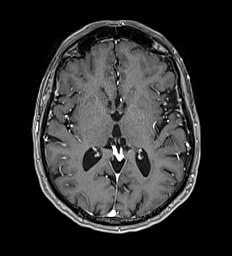
[im 96/176]
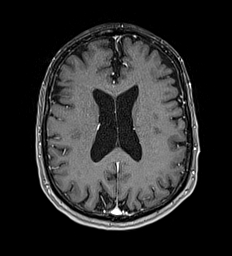
[im 128/176]
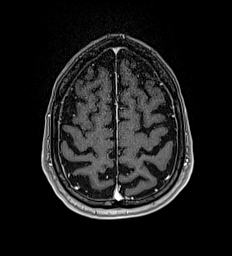
[im 144/176]
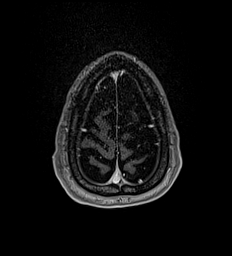
[im 160/176]
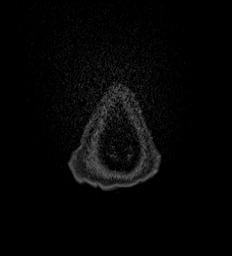
[im 176/176]
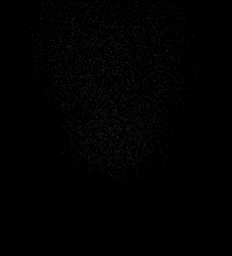

[30 of 48 positions shown; findings below may reference images not displayed]

FINDINGS: Brain: Mild generalized atrophy. Negative for hydrocephalus.
Negative for acute infarct. Normal white matter. Negative for
hemorrhage or mass

IAC protocol. Seventh and eighth cranial nerves normal. Negative for
vestibular schwannoma. Basilar cisterns normal. Brainstem and
cerebellum normal. Small right mastoid effusion. No enhancing lesion
in the mastoid sinus. There is no enhancing lesion in the temporal
bone.

Vascular: Normal arterial flow voids.

Skull and upper cervical spine: Negative

Sinuses/Orbits: Paranasal sinuses clear. Bilateral cataract
extraction

Other: None
IMPRESSION: No cause for hearing loss or tinnitus identified

Mild cerebral atrophy.  No acute abnormality.

## 2020-06-15 MED ORDER — GADOBUTROL 1 MMOL/ML IV SOLN
8.0000 mL | Freq: Once | INTRAVENOUS | Status: AC | PRN
  Administered 2020-06-15: 8 mL via INTRAVENOUS

## 2020-06-20 ENCOUNTER — Ambulatory Visit: Admit: 2020-06-20 | Discharge: 2020-06-21

## 2020-06-20 DIAGNOSIS — R682 Dry mouth, unspecified: Principal | ICD-10-CM

## 2020-06-20 DIAGNOSIS — F330 Major depressive disorder, recurrent, mild: Principal | ICD-10-CM

## 2020-06-20 DIAGNOSIS — Z7901 Long term (current) use of anticoagulants: Principal | ICD-10-CM

## 2020-06-20 DIAGNOSIS — E785 Hyperlipidemia, unspecified: Principal | ICD-10-CM

## 2020-06-20 DIAGNOSIS — R351 Nocturia: Principal | ICD-10-CM

## 2020-06-20 DIAGNOSIS — Z79899 Other long term (current) drug therapy: Principal | ICD-10-CM

## 2020-06-20 DIAGNOSIS — G629 Polyneuropathy, unspecified: Principal | ICD-10-CM

## 2020-06-20 DIAGNOSIS — N401 Enlarged prostate with lower urinary tract symptoms: Principal | ICD-10-CM

## 2020-06-23 ENCOUNTER — Ambulatory Visit: Admit: 2020-06-23 | Discharge: 2020-06-24 | Payer: MEDICARE

## 2020-06-23 DIAGNOSIS — R350 Frequency of micturition: Principal | ICD-10-CM

## 2020-06-23 DIAGNOSIS — R300 Dysuria: Principal | ICD-10-CM

## 2020-06-23 MED ORDER — NITROFURANTOIN MONOHYDRATE/MACROCRYSTALS 100 MG CAPSULE
ORAL_CAPSULE | Freq: Two times a day (BID) | ORAL | 0 refills | 10 days | Status: CP
Start: 2020-06-23 — End: 2020-06-23

## 2020-06-23 MED ORDER — SULFAMETHOXAZOLE 800 MG-TRIMETHOPRIM 160 MG TABLET
ORAL_TABLET | Freq: Two times a day (BID) | ORAL | 0 refills | 10 days | Status: CP
Start: 2020-06-23 — End: 2020-07-03

## 2020-06-26 DIAGNOSIS — N390 Urinary tract infection, site not specified: Principal | ICD-10-CM

## 2020-06-26 MED ORDER — DOXYCYCLINE HYCLATE 100 MG CAPSULE
ORAL_CAPSULE | Freq: Two times a day (BID) | ORAL | 0 refills | 10 days | Status: CP
Start: 2020-06-26 — End: 2020-07-06

## 2020-06-29 DIAGNOSIS — G629 Polyneuropathy, unspecified: Principal | ICD-10-CM

## 2020-07-06 ENCOUNTER — Ambulatory Visit: Admit: 2020-07-06 | Discharge: 2020-07-07 | Payer: MEDICARE

## 2020-07-06 DIAGNOSIS — G629 Polyneuropathy, unspecified: Principal | ICD-10-CM

## 2020-07-06 DIAGNOSIS — R799 Abnormal finding of blood chemistry, unspecified: Principal | ICD-10-CM

## 2020-07-06 DIAGNOSIS — R251 Tremor, unspecified: Principal | ICD-10-CM

## 2020-07-06 DIAGNOSIS — R269 Unspecified abnormalities of gait and mobility: Principal | ICD-10-CM

## 2020-07-10 DIAGNOSIS — R350 Frequency of micturition: Principal | ICD-10-CM

## 2020-07-10 MED ORDER — MIRABEGRON ER 50 MG TABLET,EXTENDED RELEASE 24 HR
ORAL_TABLET | Freq: Every day | ORAL | 3 refills | 90.00000 days | Status: CP
Start: 2020-07-10 — End: ?

## 2020-07-11 ENCOUNTER — Other Ambulatory Visit: Admit: 2020-07-11 | Discharge: 2020-07-12 | Payer: MEDICARE

## 2020-07-11 DIAGNOSIS — R350 Frequency of micturition: Principal | ICD-10-CM

## 2020-07-14 DIAGNOSIS — R319 Hematuria, unspecified: Principal | ICD-10-CM

## 2020-07-21 ENCOUNTER — Ambulatory Visit: Admit: 2020-07-21 | Discharge: 2020-08-19 | Payer: MEDICARE

## 2020-07-21 ENCOUNTER — Ambulatory Visit: Admit: 2020-07-21 | Discharge: 2020-08-03 | Payer: MEDICARE

## 2020-07-24 ENCOUNTER — Ambulatory Visit: Admit: 2020-07-24 | Discharge: 2020-07-25 | Payer: MEDICARE

## 2020-07-24 DIAGNOSIS — L438 Other lichen planus: Principal | ICD-10-CM

## 2020-07-24 DIAGNOSIS — R682 Dry mouth, unspecified: Principal | ICD-10-CM

## 2020-07-24 DIAGNOSIS — R3129 Other microscopic hematuria: Principal | ICD-10-CM

## 2020-07-24 DIAGNOSIS — N138 Other obstructive and reflux uropathy: Principal | ICD-10-CM

## 2020-07-24 DIAGNOSIS — H409 Unspecified glaucoma: Principal | ICD-10-CM

## 2020-07-24 DIAGNOSIS — F321 Major depressive disorder, single episode, moderate: Principal | ICD-10-CM

## 2020-07-24 DIAGNOSIS — N401 Enlarged prostate with lower urinary tract symptoms: Principal | ICD-10-CM

## 2020-07-24 DIAGNOSIS — I82531 Chronic embolism and thrombosis of right popliteal vein: Principal | ICD-10-CM

## 2020-07-24 DIAGNOSIS — G629 Polyneuropathy, unspecified: Principal | ICD-10-CM

## 2020-07-24 DIAGNOSIS — F419 Anxiety disorder, unspecified: Principal | ICD-10-CM

## 2020-07-24 DIAGNOSIS — E785 Hyperlipidemia, unspecified: Principal | ICD-10-CM

## 2020-07-24 DIAGNOSIS — K50119 Crohn's disease of large intestine with unspecified complications: Principal | ICD-10-CM

## 2020-07-24 DIAGNOSIS — E039 Hypothyroidism, unspecified: Principal | ICD-10-CM

## 2020-07-24 DIAGNOSIS — F32A Anxiety and depression: Principal | ICD-10-CM

## 2020-07-24 DIAGNOSIS — I2699 Other pulmonary embolism without acute cor pulmonale: Principal | ICD-10-CM

## 2020-07-24 DIAGNOSIS — E8809 Other disorders of plasma-protein metabolism, not elsewhere classified: Principal | ICD-10-CM

## 2020-07-24 MED ORDER — GABAPENTIN 100 MG CAPSULE
ORAL_CAPSULE | 3 refills | 0 days | Status: CP
Start: 2020-07-24 — End: 2021-07-24

## 2020-07-27 ENCOUNTER — Ambulatory Visit: Admit: 2020-07-27 | Discharge: 2020-07-28 | Payer: MEDICARE

## 2020-07-27 DIAGNOSIS — R21 Rash and other nonspecific skin eruption: Principal | ICD-10-CM

## 2020-07-27 DIAGNOSIS — R208 Other disturbances of skin sensation: Principal | ICD-10-CM

## 2020-08-03 ENCOUNTER — Ambulatory Visit: Admit: 2020-08-03 | Discharge: 2020-08-03 | Payer: MEDICARE | Attending: Urology | Primary: Urology

## 2020-08-03 DIAGNOSIS — N401 Enlarged prostate with lower urinary tract symptoms: Principal | ICD-10-CM

## 2020-08-03 DIAGNOSIS — N200 Calculus of kidney: Principal | ICD-10-CM

## 2020-08-03 DIAGNOSIS — R319 Hematuria, unspecified: Principal | ICD-10-CM

## 2020-08-03 DIAGNOSIS — R350 Frequency of micturition: Principal | ICD-10-CM

## 2020-08-03 DIAGNOSIS — N390 Urinary tract infection, site not specified: Principal | ICD-10-CM

## 2020-08-03 DIAGNOSIS — R351 Nocturia: Principal | ICD-10-CM

## 2020-08-03 MED ORDER — VIBEGRON 75 MG TABLET
ORAL_TABLET | Freq: Every day | ORAL | 11 refills | 0.00000 days | Status: CP
Start: 2020-08-03 — End: ?

## 2020-08-11 DIAGNOSIS — R682 Dry mouth, unspecified: Principal | ICD-10-CM

## 2020-08-11 MED ORDER — PILOCARPINE 5 MG TABLET
ORAL_TABLET | Freq: Three times a day (TID) | ORAL | 1 refills | 90 days | Status: CP
Start: 2020-08-11 — End: 2021-08-11

## 2020-08-20 ENCOUNTER — Ambulatory Visit: Admit: 2020-08-20 | Discharge: 2020-08-21 | Payer: MEDICARE

## 2020-08-20 ENCOUNTER — Encounter: Admit: 2020-08-20 | Payer: MEDICARE | Attending: Pharmacist | Primary: Pharmacist

## 2020-08-21 MED ORDER — ACETAMINOPHEN 500 MG TABLET
ORAL_TABLET | Freq: Three times a day (TID) | ORAL | 0 refills | 4 days | Status: CP | PRN
Start: 2020-08-21 — End: 2020-08-28

## 2020-08-21 MED ORDER — DOXYCYCLINE HYCLATE 50 MG CAPSULE
ORAL_CAPSULE | Freq: Two times a day (BID) | ORAL | 0 refills | 10 days | Status: CP
Start: 2020-08-21 — End: 2020-08-31

## 2020-08-21 MED ORDER — TAMSULOSIN 0.4 MG CAPSULE
ORAL_CAPSULE | Freq: Every day | ORAL | 0 refills | 30 days | Status: CP
Start: 2020-08-21 — End: 2021-08-21

## 2020-09-11 ENCOUNTER — Other Ambulatory Visit: Admit: 2020-09-11 | Discharge: 2020-09-12 | Payer: MEDICARE

## 2020-09-11 DIAGNOSIS — Z01818 Encounter for other preprocedural examination: Principal | ICD-10-CM

## 2020-09-11 DIAGNOSIS — N390 Urinary tract infection, site not specified: Principal | ICD-10-CM

## 2020-09-13 DIAGNOSIS — L989 Disorder of the skin and subcutaneous tissue, unspecified: Principal | ICD-10-CM

## 2020-09-13 MED ORDER — MUPIROCIN 2 % TOPICAL OINTMENT
TOPICAL | 3 refills | 0.00000 days | Status: CP
Start: 2020-09-13 — End: ?

## 2020-09-21 ENCOUNTER — Other Ambulatory Visit: Admit: 2020-09-21 | Discharge: 2020-09-22 | Payer: MEDICARE

## 2020-09-21 DIAGNOSIS — R300 Dysuria: Principal | ICD-10-CM

## 2020-09-21 MED ORDER — PHENAZOPYRIDINE 200 MG TABLET
ORAL_TABLET | Freq: Three times a day (TID) | ORAL | 0 refills | 3 days | Status: CP | PRN
Start: 2020-09-21 — End: 2020-09-24

## 2020-09-27 ENCOUNTER — Ambulatory Visit: Admit: 2020-09-27 | Discharge: 2020-09-28 | Payer: MEDICARE | Attending: Surgical Oncology | Primary: Surgical Oncology

## 2020-09-28 ENCOUNTER — Ambulatory Visit: Admit: 2020-09-28 | Discharge: 2020-09-29 | Payer: MEDICARE

## 2020-09-28 DIAGNOSIS — E8809 Other disorders of plasma-protein metabolism, not elsewhere classified: Principal | ICD-10-CM

## 2020-09-28 DIAGNOSIS — R208 Other disturbances of skin sensation: Principal | ICD-10-CM

## 2020-09-28 MED ORDER — GABAPENTIN 100 MG CAPSULE
ORAL_CAPSULE | ORAL | 3 refills | 0.00000 days | Status: CP
Start: 2020-09-28 — End: 2021-09-28

## 2020-09-29 DIAGNOSIS — N200 Calculus of kidney: Principal | ICD-10-CM

## 2020-10-03 ENCOUNTER — Ambulatory Visit: Admit: 2020-10-03 | Discharge: 2020-10-03 | Payer: MEDICARE

## 2020-10-03 ENCOUNTER — Encounter: Admit: 2020-10-03 | Discharge: 2020-10-03 | Payer: MEDICARE | Attending: Anesthesiology | Primary: Anesthesiology

## 2020-10-03 MED ORDER — OXYBUTYNIN CHLORIDE 5 MG TABLET
ORAL_TABLET | Freq: Three times a day (TID) | ORAL | 2 refills | 10.00000 days | Status: CP | PRN
Start: 2020-10-03 — End: ?
  Filled 2020-10-03: qty 30, 10d supply, fill #0

## 2020-10-03 MED ORDER — OXYCODONE 5 MG TABLET
ORAL_TABLET | ORAL | 0 refills | 2 days | Status: CP | PRN
Start: 2020-10-03 — End: 2020-10-08
  Filled 2020-10-03: qty 10, 2d supply, fill #0

## 2020-10-12 DIAGNOSIS — N200 Calculus of kidney: Principal | ICD-10-CM

## 2020-10-20 ENCOUNTER — Emergency Department: Payer: Medicare Other

## 2020-10-20 ENCOUNTER — Encounter: Payer: Self-pay | Admitting: Emergency Medicine

## 2020-10-20 ENCOUNTER — Inpatient Hospital Stay
Admission: EM | Admit: 2020-10-20 | Discharge: 2020-10-23 | DRG: 286 | Disposition: A | Discharge status: Home / Self Care | Payer: Medicare Other | Attending: Internal Medicine | Admitting: Internal Medicine

## 2020-10-20 ENCOUNTER — Other Ambulatory Visit: Payer: Self-pay

## 2020-10-20 ENCOUNTER — Ambulatory Visit
Admission: EM | Admit: 2020-10-20 | Discharge: 2020-10-20 | Disposition: A | Discharge status: Other Acute Inpatient Hospital | Payer: Medicare Other

## 2020-10-20 DIAGNOSIS — K449 Diaphragmatic hernia without obstruction or gangrene: Secondary | ICD-10-CM | POA: Diagnosis present

## 2020-10-20 DIAGNOSIS — I5033 Acute on chronic diastolic (congestive) heart failure: Secondary | ICD-10-CM | POA: Diagnosis present

## 2020-10-20 DIAGNOSIS — Z7901 Long term (current) use of anticoagulants: Secondary | ICD-10-CM

## 2020-10-20 DIAGNOSIS — Z79899 Other long term (current) drug therapy: Secondary | ICD-10-CM

## 2020-10-20 DIAGNOSIS — Z20822 Contact with and (suspected) exposure to covid-19: Secondary | ICD-10-CM | POA: Diagnosis present

## 2020-10-20 DIAGNOSIS — I509 Heart failure, unspecified: Secondary | ICD-10-CM

## 2020-10-20 DIAGNOSIS — Z9049 Acquired absence of other specified parts of digestive tract: Secondary | ICD-10-CM

## 2020-10-20 DIAGNOSIS — D61818 Other pancytopenia: Secondary | ICD-10-CM | POA: Diagnosis present

## 2020-10-20 DIAGNOSIS — I4891 Unspecified atrial fibrillation: Secondary | ICD-10-CM | POA: Diagnosis present

## 2020-10-20 DIAGNOSIS — R079 Chest pain, unspecified: Secondary | ICD-10-CM | POA: Diagnosis not present

## 2020-10-20 DIAGNOSIS — N400 Enlarged prostate without lower urinary tract symptoms: Secondary | ICD-10-CM | POA: Diagnosis present

## 2020-10-20 DIAGNOSIS — K5090 Crohn's disease, unspecified, without complications: Secondary | ICD-10-CM | POA: Diagnosis present

## 2020-10-20 DIAGNOSIS — E039 Hypothyroidism, unspecified: Secondary | ICD-10-CM | POA: Diagnosis present

## 2020-10-20 DIAGNOSIS — M25511 Pain in right shoulder: Secondary | ICD-10-CM | POA: Diagnosis present

## 2020-10-20 DIAGNOSIS — Z79890 Hormone replacement therapy (postmenopausal): Secondary | ICD-10-CM

## 2020-10-20 DIAGNOSIS — F32A Depression, unspecified: Secondary | ICD-10-CM | POA: Diagnosis present

## 2020-10-20 DIAGNOSIS — Z96649 Presence of unspecified artificial hip joint: Secondary | ICD-10-CM | POA: Diagnosis present

## 2020-10-20 DIAGNOSIS — I2510 Atherosclerotic heart disease of native coronary artery without angina pectoris: Principal | ICD-10-CM | POA: Diagnosis present

## 2020-10-20 DIAGNOSIS — I82409 Acute embolism and thrombosis of unspecified deep veins of unspecified lower extremity: Secondary | ICD-10-CM | POA: Diagnosis present

## 2020-10-20 DIAGNOSIS — G8929 Other chronic pain: Secondary | ICD-10-CM | POA: Diagnosis present

## 2020-10-20 DIAGNOSIS — R1310 Dysphagia, unspecified: Secondary | ICD-10-CM

## 2020-10-20 DIAGNOSIS — E876 Hypokalemia: Secondary | ICD-10-CM | POA: Diagnosis not present

## 2020-10-20 DIAGNOSIS — Z86718 Personal history of other venous thrombosis and embolism: Secondary | ICD-10-CM

## 2020-10-20 DIAGNOSIS — K2270 Barrett's esophagus without dysplasia: Secondary | ICD-10-CM | POA: Diagnosis present

## 2020-10-20 HISTORY — DX: Hypothyroidism, unspecified: E03.9

## 2020-10-20 HISTORY — DX: Anemia, unspecified: D64.9

## 2020-10-20 LAB — COMPREHENSIVE METABOLIC PANEL
ALT: 13 U/L (ref 0–44)
AST: 24 U/L (ref 15–41)
Albumin: 2.7 g/dL — ABNORMAL LOW (ref 3.5–5.0)
Alkaline Phosphatase: 81 U/L (ref 38–126)
Anion gap: 5 (ref 5–15)
BUN: 16 mg/dL (ref 8–23)
CO2: 22 mmol/L (ref 22–32)
Calcium: 8.1 mg/dL — ABNORMAL LOW (ref 8.9–10.3)
Chloride: 106 mmol/L (ref 98–111)
Creatinine, Ser: 1.08 mg/dL (ref 0.61–1.24)
GFR, Estimated: >60 mL/min (ref >60)
Glucose, Bld: 116 mg/dL — ABNORMAL HIGH (ref 70–99)
Potassium: 3.7 mmol/L (ref 3.5–5.1)
Sodium: 133 mmol/L — ABNORMAL LOW (ref 135–145)
Total Bilirubin: 1.6 mg/dL — ABNORMAL HIGH (ref 0.3–1.2)
Total Protein: 5.8 g/dL — ABNORMAL LOW (ref 6.5–8.1)

## 2020-10-20 LAB — CBC WITH DIFFERENTIAL/PLATELET
Abs Immature Granulocytes: 0.19 10*3/uL — ABNORMAL HIGH (ref 0.00–0.07)
Basophils Absolute: 0.1 10*3/uL (ref 0.0–0.1)
Basophils Relative: 3 %
Eosinophils Absolute: 0 10*3/uL (ref 0.0–0.5)
Eosinophils Relative: 0 %
HCT: 25.4 % — ABNORMAL LOW (ref 39.0–52.0)
Hemoglobin: 8.4 g/dL — ABNORMAL LOW (ref 13.0–17.0)
Immature Granulocytes: 8 %
Lymphocytes Relative: 19 %
Lymphs Abs: 0.4 10*3/uL — ABNORMAL LOW (ref 0.7–4.0)
MCH: 37 pg — ABNORMAL HIGH (ref 26.0–34.0)
MCHC: 33.1 g/dL (ref 30.0–36.0)
MCV: 111.9 fL — ABNORMAL HIGH (ref 80.0–100.0)
Monocytes Absolute: 0.1 10*3/uL (ref 0.1–1.0)
Monocytes Relative: 5 %
Neutro Abs: 1.4 10*3/uL — ABNORMAL LOW (ref 1.7–7.7)
Neutrophils Relative %: 65 %
Platelets: 170 10*3/uL (ref 150–400)
RBC: 2.27 MIL/uL — ABNORMAL LOW (ref 4.22–5.81)
RDW: 18.1 % — ABNORMAL HIGH (ref 11.5–15.5)
Smear Review: NORMAL
WBC: 2.3 10*3/uL — ABNORMAL LOW (ref 4.0–10.5)
nRBC: 2.2 % — ABNORMAL HIGH (ref 0.0–0.2)

## 2020-10-20 LAB — TROPONIN I (HIGH SENSITIVITY)
Troponin I (High Sensitivity): 150 ng/L (ref <18)
Troponin I (High Sensitivity): 135 ng/L (ref <18)

## 2020-10-20 LAB — LIPASE, BLOOD: Lipase: 28 U/L (ref 11–51)

## 2020-10-20 LAB — RESP PANEL BY RT-PCR (FLU A&B, COVID) ARPGX2
Influenza A by PCR: NEGATIVE
Influenza B by PCR: NEGATIVE
SARS Coronavirus 2 by RT PCR: NEGATIVE

## 2020-10-20 IMAGING — CR DG CHEST 2V
1 series · 3 of 3 positions shown · non-contrast
Comparison: None.

CLINICAL DATA: Chest pain

EXAM:
CHEST - 2 VIEW

[Series 1: dg chest 2 view · 0.14mm/px · 3 of 3 slices shown]
[im 1/3]
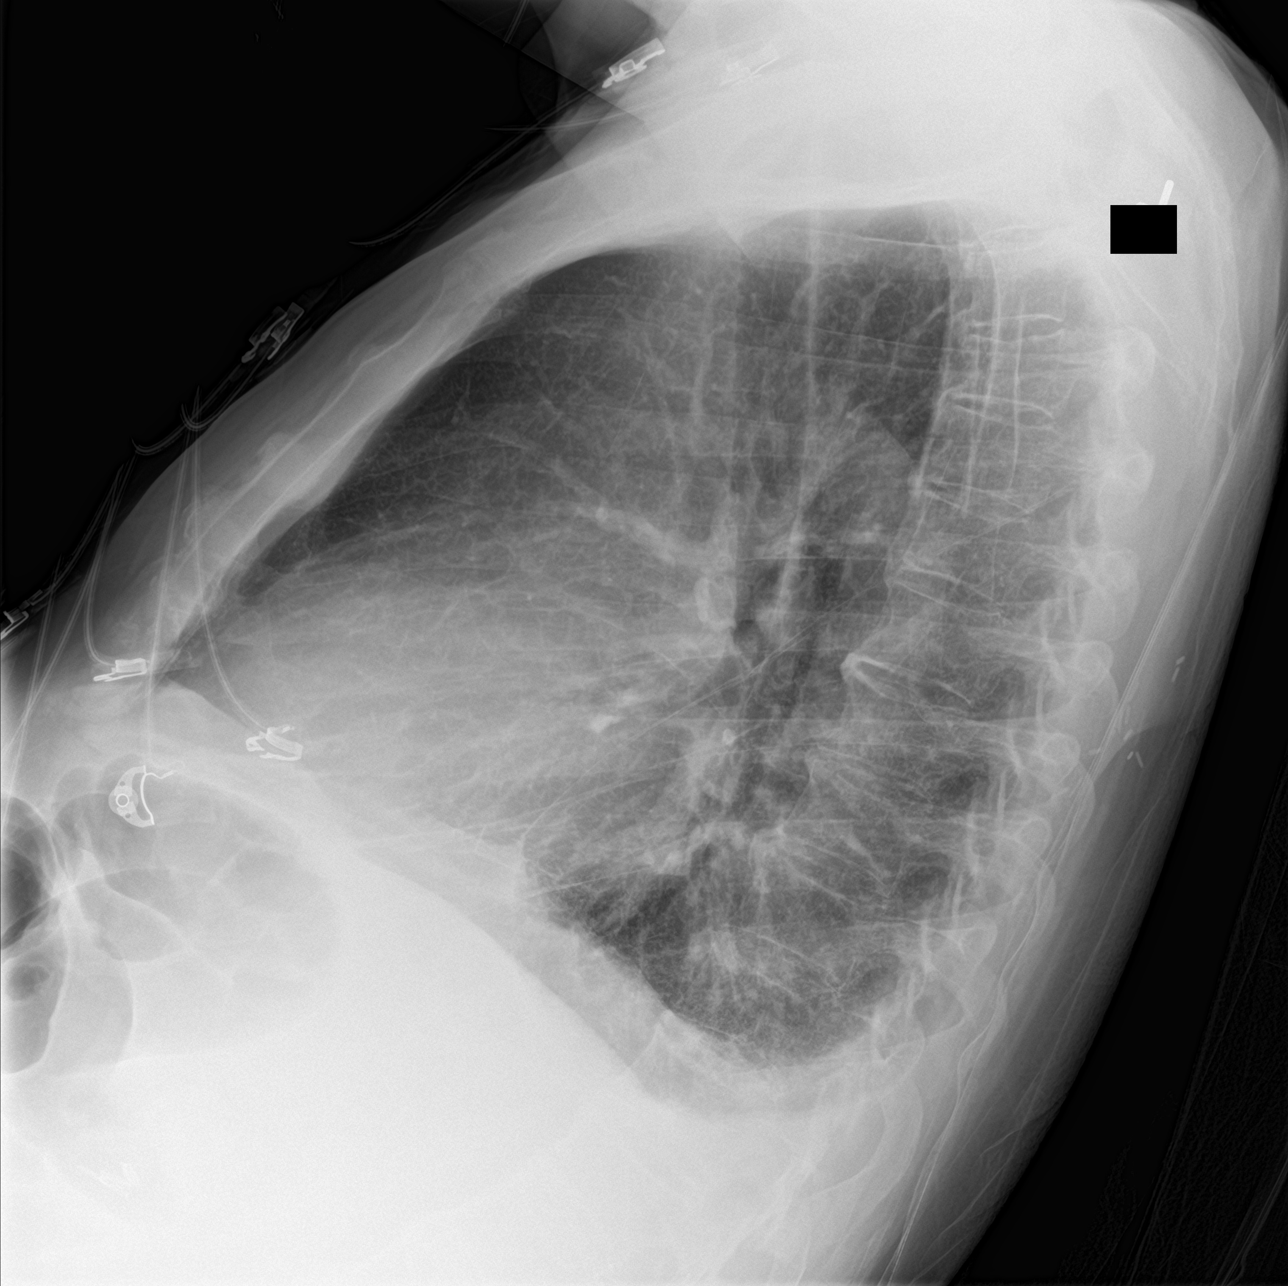
[im 2/3]
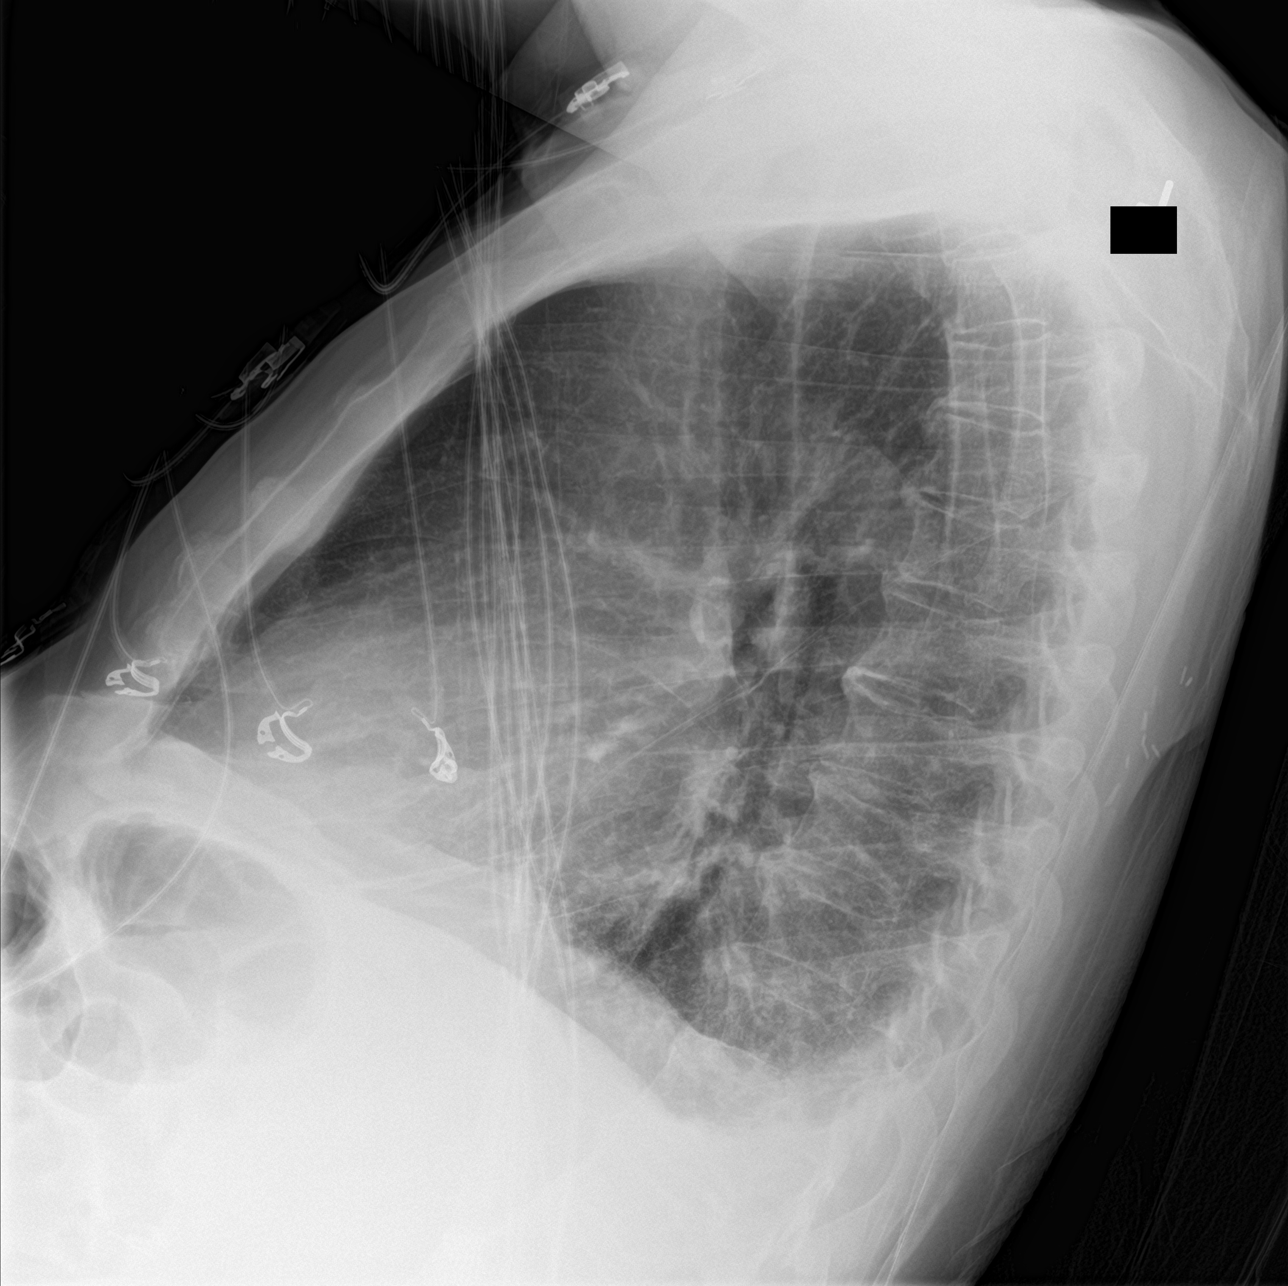
[im 3/3]
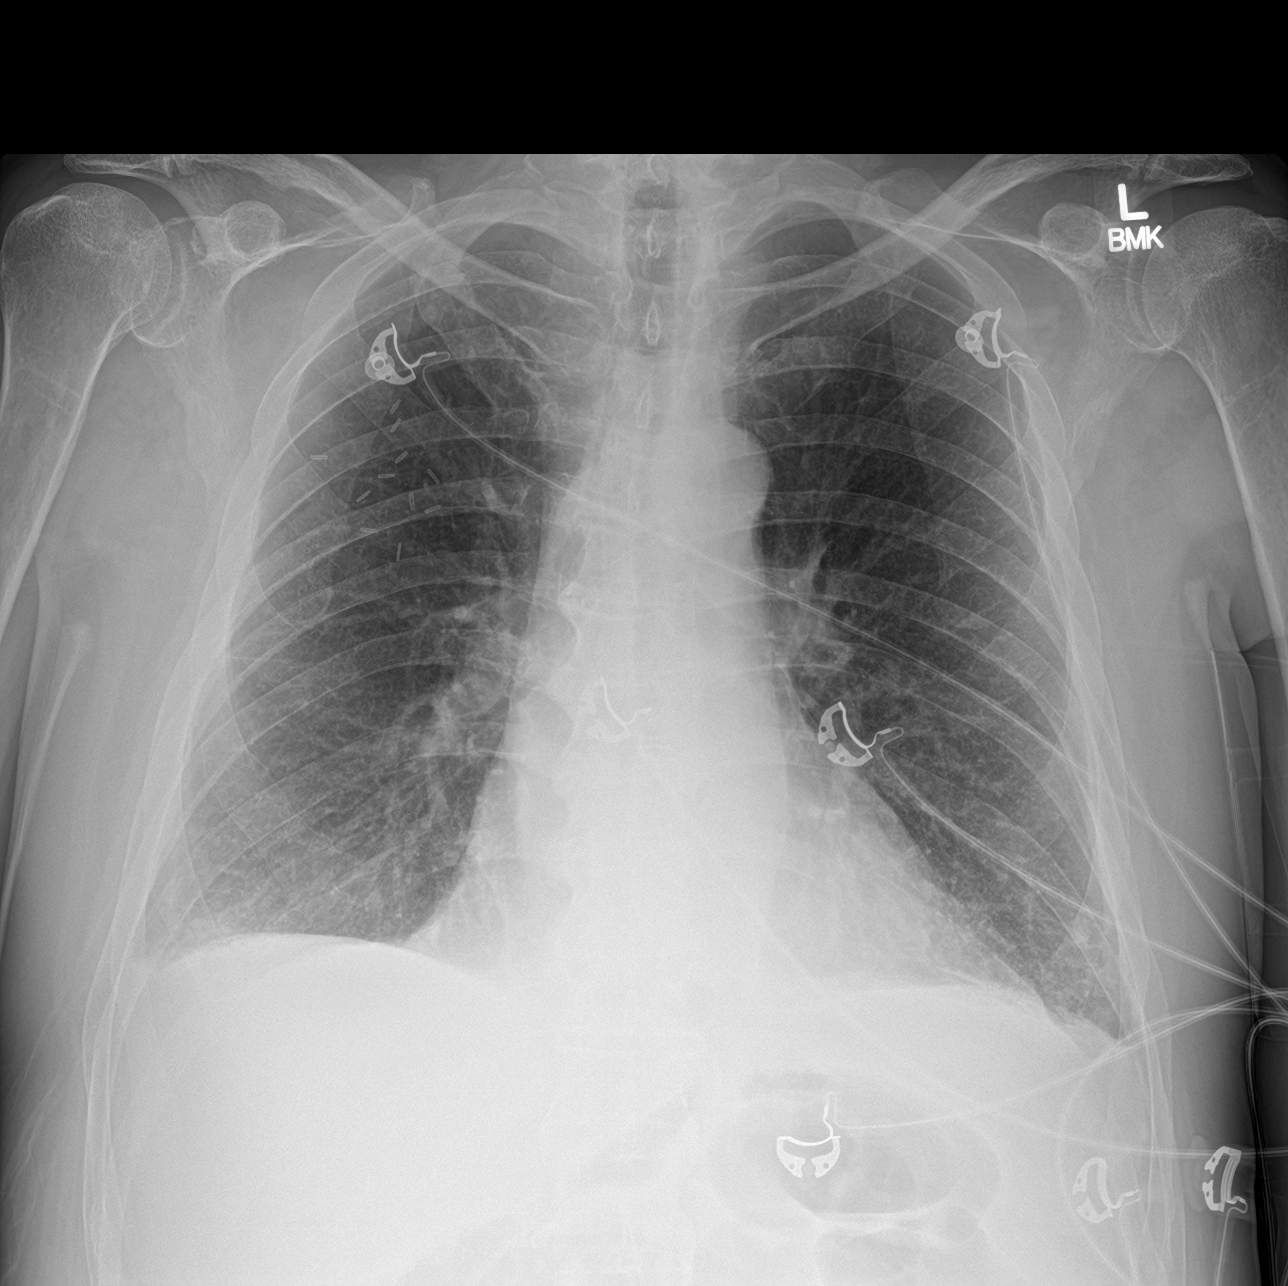

[3 of 3 positions shown; findings below may reference images not displayed]

FINDINGS: Small bilateral pleural effusions. No focal consolidation. Normal
cardiomediastinal silhouette. No pneumothorax. Clips over the right
chest. Linear atelectasis left base
IMPRESSION: Small bilateral pleural effusions.

## 2020-10-20 IMAGING — CT CT ANGIO CHEST-ABD-PELV FOR DISSECTION W/ AND WO/W CM
2 of 7 series · 12 of 46 positions shown, 14 images · IV contrast (APPLIED)
Comparison: Radiograph earlier today.

CLINICAL DATA: Chest pain today.

EXAM:
CT ANGIOGRAPHY CHEST, ABDOMEN AND PELVIS
TECHNIQUE: Non-contrast CT of the chest was initially obtained. Multidetector
CT imaging through the chest, abdomen and pelvis was performed using
the standard protocol during bolus administration of intravenous
contrast. Multiplanar reconstructed images and MIPs were obtained
and reviewed to evaluate the vascular anatomy.
CONTRAST:  100mL OMNIPAQUE IOHEXOL 350 MG/ML SOLN

[Series 5: axial arterial · axial · arterial · 0.93mm/px · z∈[-811,-223]mm · 9 of 224 slices shown, 11 images]
[im 14/224  soft-tissue]
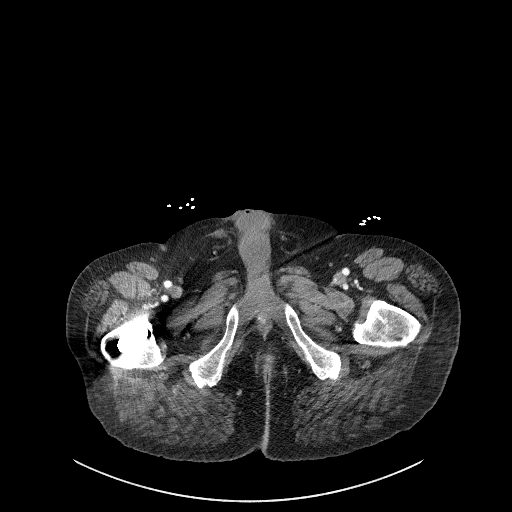
[im 14/224  bone]
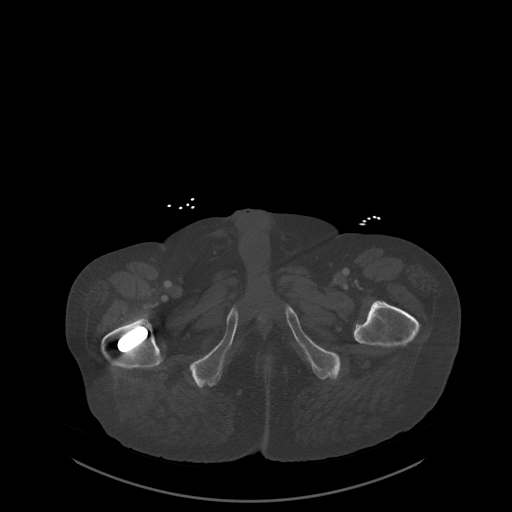
[im 42/224  soft-tissue]
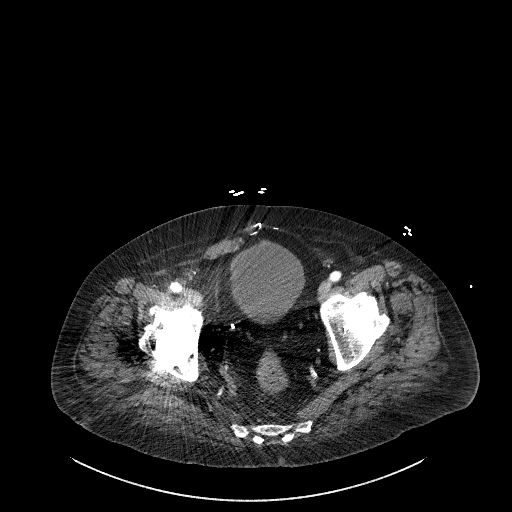
[im 70/224  soft-tissue]
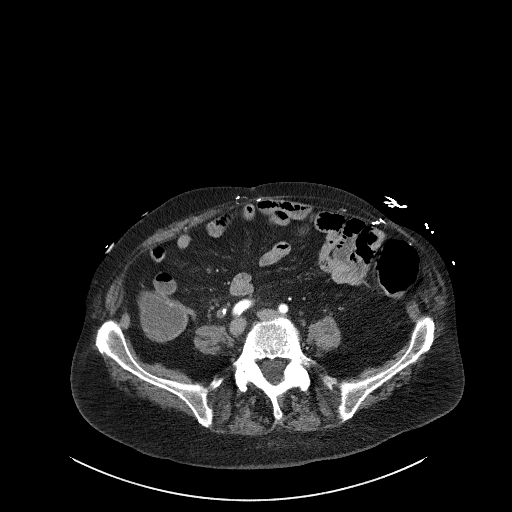
[im 84/224  soft-tissue]
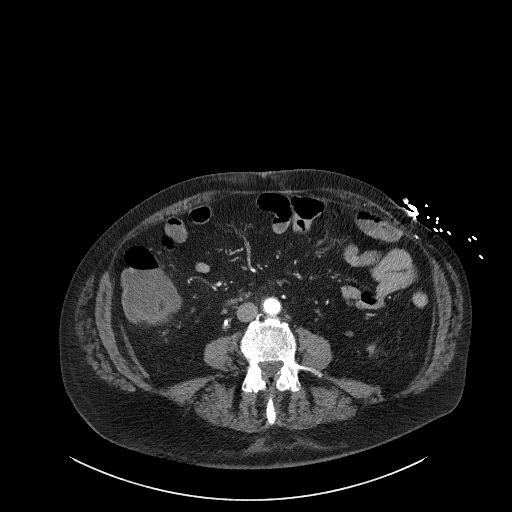
[im 112/224  soft-tissue]
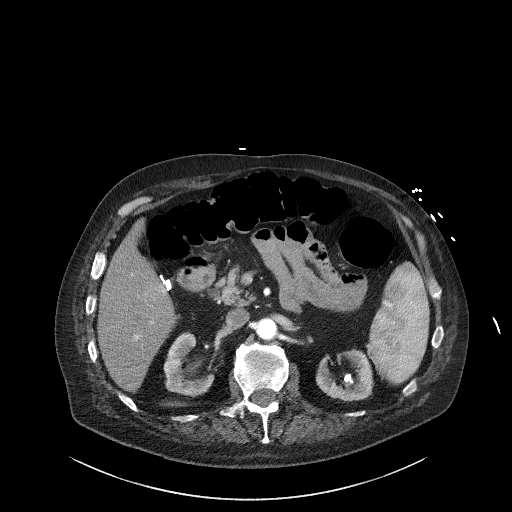
[im 140/224  soft-tissue]
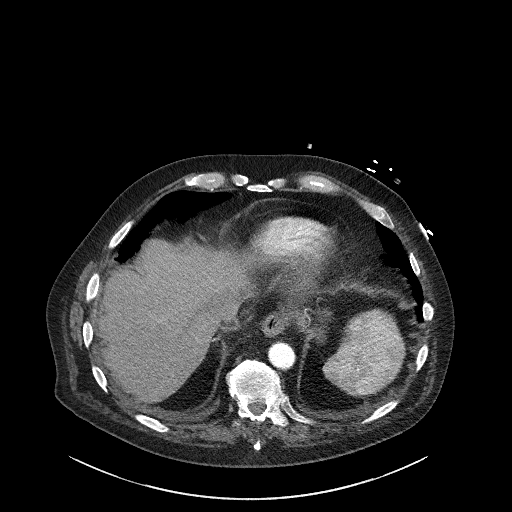
[im 154/224  soft-tissue]
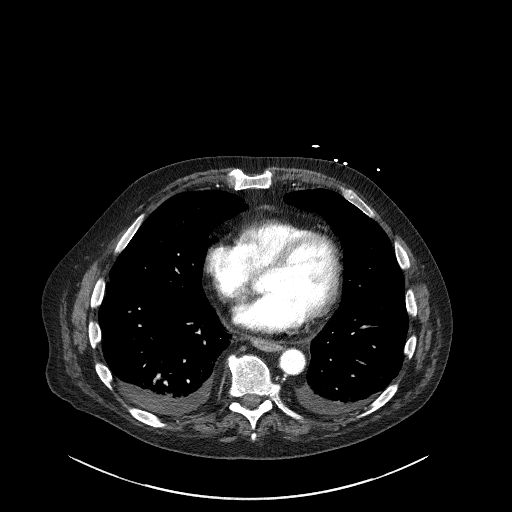
[im 182/224  soft-tissue]
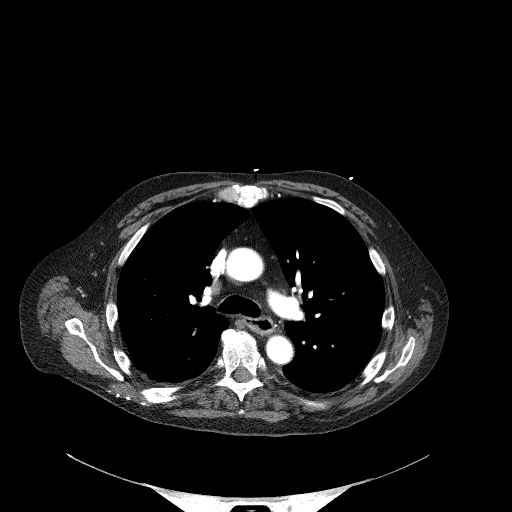
[im 210/224  soft-tissue]
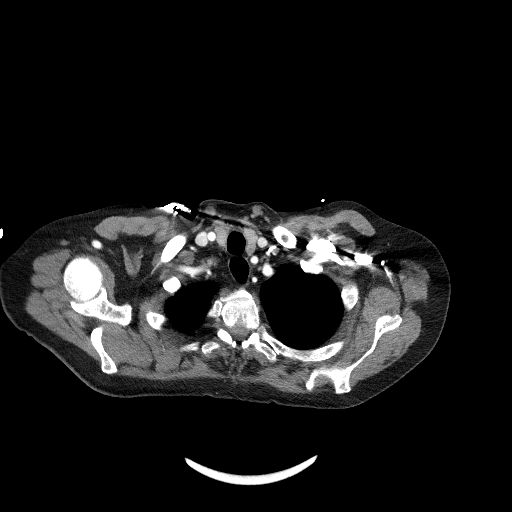
[im 210/224  bone]
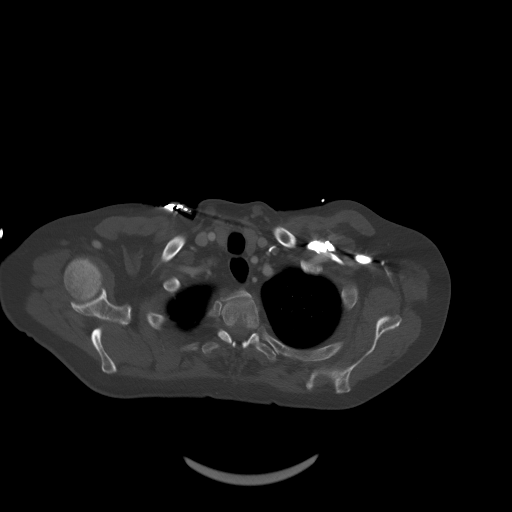

[Series 7: coronals · coronal · 0.88mm/px · 3 of 151 slices shown]
[im 38/151  soft-tissue]
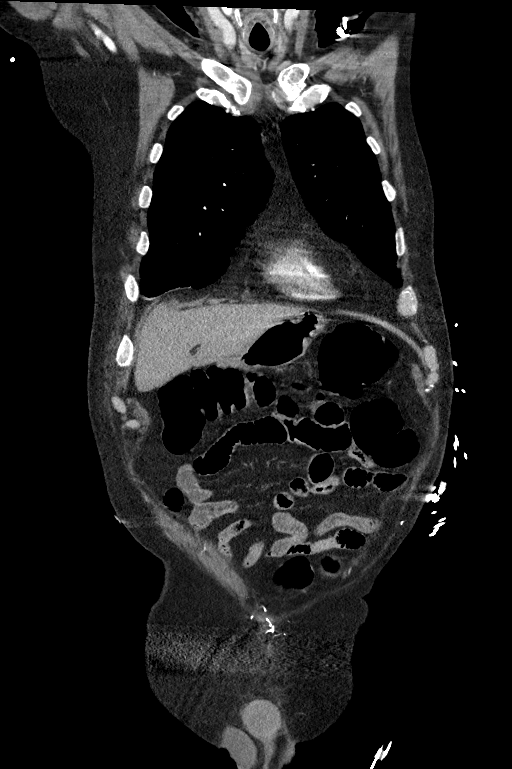
[im 76/151  soft-tissue]
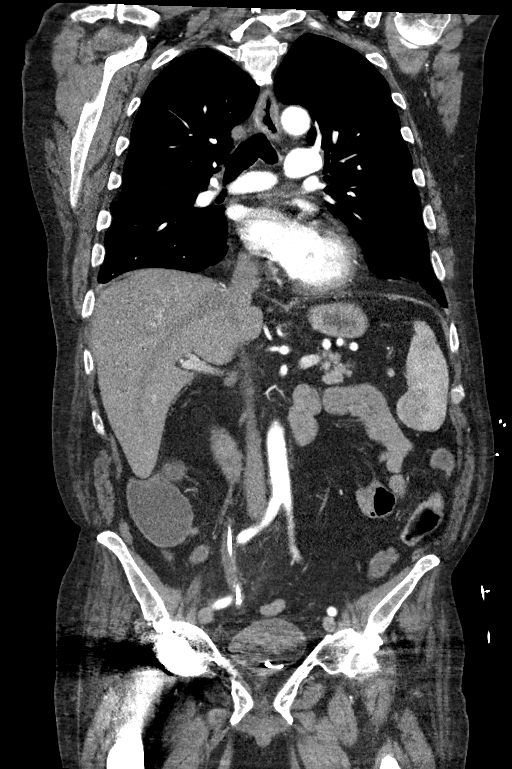
[im 113/151  soft-tissue]
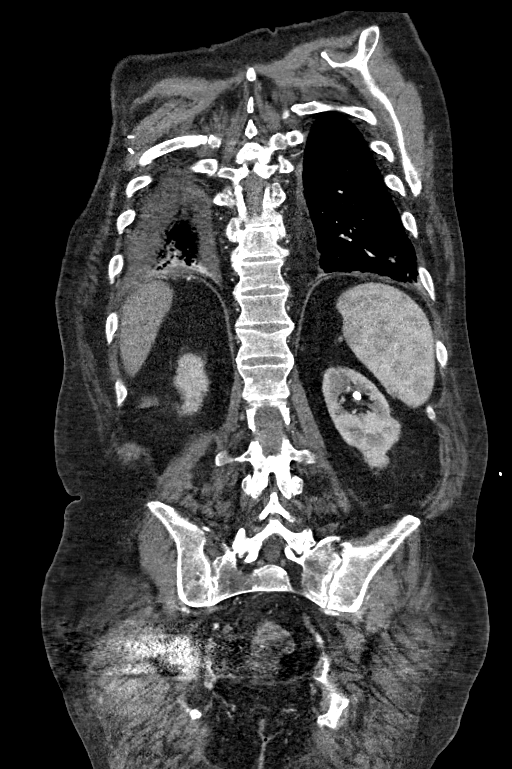

[12 of 46 positions shown; findings below may reference images not displayed]

FINDINGS: CTA CHEST FINDINGS

Cardiovascular: No aortic hematoma noncontrast exam. Thoracic aorta
is normal in caliber. No aneurysm. No dissection, evidence of acute
aortic syndrome or vasculitis. The pulmonary arteries are well
opacified to the subsegmental level. There is no pulmonary embolus.
Heart is normal in size. Trace pericardial effusion. Mild coronary
artery calcifications.

Mediastinum/Nodes: Calcified left hilar lymph nodes consistent with
prior granulomatous disease. No noncalcified adenopathy. Scattered
small mediastinal lymph nodes are not enlarged by size criteria.
Mild esophageal wall thickening with questionable paraesophageal
edema and intraluminal debris in the mid esophagus. No
pneumomediastinum. No thyroid nodule.

Lungs/Pleura: Small bilateral pleural effusions with adjacent
compressive atelectasis. Left lower lobe calcification may be a
calcified granuloma or pleural calcification. Mild central bronchial
thickening. No pneumothorax. No pulmonary mass.

Musculoskeletal: Mild anterior wedging of T1, T3, and T12 vertebral
bodies, no adjacent soft tissue thickening or inflammation to
suggest acuity. No focal bone lesion. Mild multilevel thoracic
spondylosis with endplate spurring. Surgical clips posterior to the
right scapula.

Review of the MIP images confirms the above findings.

CTA ABDOMEN AND PELVIS FINDINGS

VASCULAR

Aorta: Normal caliber aorta without aneurysm, dissection, vasculitis
or significant stenosis. Mild aortic atherosclerosis.

Celiac: Patent without evidence of aneurysm, dissection, vasculitis
or significant stenosis. Separate origins of the right left hepatic
artery from the celiac axis.

SMA: Patent without evidence of aneurysm, dissection, vasculitis or
significant stenosis.

Renals: Both renal arteries are patent without evidence of aneurysm,
dissection, vasculitis, fibromuscular dysplasia or significant
stenosis.

IMA: Patent without evidence of aneurysm, dissection, vasculitis or
significant stenosis.

Inflow: Patent without evidence of aneurysm, dissection, vasculitis
or significant stenosis.

Veins: No obvious venous abnormality within the limitations of this
arterial phase study.

Review of the MIP images confirms the above findings.

NON-VASCULAR

Hepatobiliary: No evidence of focal liver abnormality on this
arterial exam. Post cholecystectomy. No biliary dilatation.

Pancreas: No ductal dilatation or inflammation.

Spleen: Mildly enlarged spanning 13.8 cm cranial caudal. No focal
abnormality on arterial phase imaging.

Adrenals/Urinary Tract: No adrenal nodule. Right ureteral stent in
place with pigtail in the right renal pelvis and urinary bladder. 4
mm stone in the right distal ureter adjacent to the stent, series 5,
image 175. Likely tiny stone fragment slightly more proximally,
series 5, image 170. Mild stranding about the distal aspect of the
ureter and stent. There is mild right hydronephrosis and hydroureter
despite stent placement. Nonobstructing 8 mm stone in the upper
right kidney. Punctate nonobstructing stone in the mid right kidney.

Left renal collecting system appears at least partially duplicated.
There are multiple nonobstructing intrarenal left renal calculi.
Mild thinning of the left renal parenchyma. Small cyst arises from
the lower left kidney. There is no left hydronephrosis. No left
ureteral stones. Urinary bladder is partially distended, no stones
in the bladder. No bladder wall thickening.

Stomach/Bowel: Stomach is decompressed. Normal positioning of the
duodenum and ligament of Treitz. No small bowel obstruction or
inflammation. Normal appendix tentatively visualized. Fluid/liquid
stool in the right colon. Air-filled transverse, descending, and
proximal sigmoid colon. There is no colonic wall thickening or
inflammation. No bowel obstruction.

Lymphatic: No abdominopelvic adenopathy.

Reproductive: Prostate gland not seen, atrophic or surgically
absent.

Other: Postsurgical change of the anterior abdominal wall. No
ascites. No free air. No focal fluid collection. Bilateral fat
containing inguinal hernias.

Musculoskeletal: Right hip arthroplasty. Moderate degenerative
change of the left hip. Degenerative change throughout the lumbar
spine without acute lumbar abnormality.

Review of the MIP images confirms the above findings.
IMPRESSION: 1. No aortic dissection or acute aortic abnormality. Mild aortic
atherosclerosis. No pulmonary embolus.
2. Small bilateral pleural effusions with adjacent compressive
atelectasis. Mild central bronchial thickening.
3. Mild esophageal wall thickening with question of paraesophageal
stranding and luminal debris in the mid esophagus. Query
esophagitis. Endoscopy may be of value if not recently performed for
more detailed esophageal assessment.
4. Right ureteral stent in place with 4 mm stone in the right distal
ureter adjacent to the stent. Likely tiny stone fragment slightly
more proximally. Mild right hydronephrosis and hydroureter despite
stent placement.
5. Additional nonobstructing stones in both kidneys.
6. Mild splenomegaly.
7. Mild anterior wedging of T1, T3, and T12 vertebral bodies, no
adjacent soft tissue thickening or inflammation to suggest acuity.
8. Fluid/liquid stool in the right colon, can be seen with diarrheal
illness. No colonic wall thickening or inflammation.

Aortic Atherosclerosis ([CC]-[CC]).

## 2020-10-20 MED ORDER — DICYCLOMINE HCL 20 MG PO TABS
20.0000 mg | ORAL_TABLET | Freq: Three times a day (TID) | ORAL | Status: DC
  Administered 2020-10-21 – 2020-10-22 (×5): 20 mg via ORAL
  Filled 2020-10-20 (×12): qty 1

## 2020-10-20 MED ORDER — DARIFENACIN HYDROBROMIDE ER 7.5 MG PO TB24: 7.5000 mg | ORAL_TABLET | Freq: Every day | ORAL | Status: DC

## 2020-10-20 MED ORDER — ACETAMINOPHEN 325 MG PO TABS
650.0000 mg | ORAL_TABLET | ORAL | Status: DC | PRN
  Administered 2020-10-21: 650 mg via ORAL
  Filled 2020-10-20: qty 2

## 2020-10-20 MED ORDER — GABAPENTIN 100 MG PO CAPS
100.0000 mg | ORAL_CAPSULE | Freq: Three times a day (TID) | ORAL | Status: DC
  Administered 2020-10-21 – 2020-10-23 (×6): 100 mg via ORAL
  Filled 2020-10-20 (×7): qty 1

## 2020-10-20 MED ORDER — TRAZODONE HCL 100 MG PO TABS
100.0000 mg | ORAL_TABLET | Freq: Every day | ORAL | Status: DC
  Administered 2020-10-21 – 2020-10-22 (×2): 100 mg via ORAL
  Filled 2020-10-20 (×3): qty 1

## 2020-10-20 MED ORDER — LEVOTHYROXINE SODIUM 88 MCG PO TABS: 88.0000 ug | ORAL_TABLET | Freq: Every day | ORAL | Status: DC

## 2020-10-20 MED ORDER — SUCRALFATE 1 G PO TABS
1.0000 g | ORAL_TABLET | Freq: Three times a day (TID) | ORAL | Status: DC
  Administered 2020-10-21 – 2020-10-23 (×6): 1 g via ORAL
  Filled 2020-10-20 (×9): qty 1

## 2020-10-20 MED ORDER — OXYBUTYNIN CHLORIDE 5 MG PO TABS
5.0000 mg | ORAL_TABLET | Freq: Three times a day (TID) | ORAL | Status: DC
  Administered 2020-10-21 – 2020-10-23 (×2): 5 mg via ORAL
  Filled 2020-10-20 (×9): qty 1

## 2020-10-20 MED ORDER — DULOXETINE HCL 60 MG PO CPEP
60.0000 mg | ORAL_CAPSULE | Freq: Every day | ORAL | Status: DC
  Filled 2020-10-20: qty 1
  Filled 2020-10-20: qty 2

## 2020-10-20 MED ORDER — FINASTERIDE 5 MG PO TABS
5.0000 mg | ORAL_TABLET | Freq: Every day | ORAL | Status: DC
  Administered 2020-10-21 – 2020-10-23 (×3): 5 mg via ORAL
  Filled 2020-10-20 (×3): qty 1

## 2020-10-20 MED ORDER — MIRTAZAPINE 15 MG PO TABS
45.0000 mg | ORAL_TABLET | Freq: Every day | ORAL | Status: DC
  Administered 2020-10-21: 45 mg via ORAL
  Filled 2020-10-20: qty 3

## 2020-10-20 MED ORDER — NITROGLYCERIN 0.4 MG SL SUBL
0.4000 mg | SUBLINGUAL_TABLET | SUBLINGUAL | Status: DC | PRN
  Filled 2020-10-20: qty 1

## 2020-10-20 MED ORDER — LEVOTHYROXINE SODIUM 100 MCG PO TABS: 100.0000 ug | ORAL_TABLET | Freq: Every day | ORAL | Status: DC

## 2020-10-20 MED ORDER — PANTOPRAZOLE SODIUM 40 MG PO TBEC: 40.0000 mg | DELAYED_RELEASE_TABLET | Freq: Every day | ORAL | Status: DC

## 2020-10-20 MED ORDER — SERTRALINE HCL 50 MG PO TABS
100.0000 mg | ORAL_TABLET | Freq: Every day | ORAL | Status: DC
  Administered 2020-10-21 – 2020-10-23 (×3): 100 mg via ORAL
  Filled 2020-10-20 (×3): qty 2

## 2020-10-20 MED ORDER — IOHEXOL 350 MG/ML SOLN
100.0000 mL | Freq: Once | INTRAVENOUS | Status: AC | PRN
  Administered 2020-10-20: 100 mL via INTRAVENOUS

## 2020-10-20 MED ORDER — TAMSULOSIN HCL 0.4 MG PO CAPS
0.4000 mg | ORAL_CAPSULE | Freq: Every day | ORAL | Status: DC
  Filled 2020-10-20: qty 1

## 2020-10-20 MED ORDER — ONDANSETRON HCL 4 MG/2ML IJ SOLN: 4.0000 mg | Freq: Four times a day (QID) | INTRAMUSCULAR | Status: DC | PRN

## 2020-10-20 MED ORDER — ATORVASTATIN CALCIUM 20 MG PO TABS
40.0000 mg | ORAL_TABLET | Freq: Every day | ORAL | Status: DC
  Administered 2020-10-21 – 2020-10-23 (×3): 40 mg via ORAL
  Filled 2020-10-20 (×3): qty 2

## 2020-10-20 MED ORDER — ASPIRIN EC 81 MG PO TBEC
81.0000 mg | DELAYED_RELEASE_TABLET | Freq: Every day | ORAL | Status: DC
  Administered 2020-10-21 – 2020-10-23 (×3): 81 mg via ORAL
  Filled 2020-10-20 (×3): qty 1

## 2020-10-20 MED ORDER — DIAZEPAM 5 MG PO TABS
5.0000 mg | ORAL_TABLET | Freq: Three times a day (TID) | ORAL | Status: DC
  Administered 2020-10-21 – 2020-10-23 (×2): 5 mg via ORAL
  Filled 2020-10-20 (×3): qty 1

## 2020-10-20 NOTE — ED Triage Notes (Signed)
Pt c/o upper back pain for several weeks. Pt states it started after having a "small fatty mass" removed in the area. Pt states the pain radiates around to his abdomen. Pt is asking for an xray of his back. Pt denies any known injuries.

## 2020-10-20 NOTE — Discharge Instructions (Addendum)
Please go to the emergency department at [facility] for evaluation of your abnormal heart rhythm.

## 2020-10-20 NOTE — ED Provider Notes (Signed)
[facility] Emergency Department Provider Note  ____________________________________________  Time seen: Approximately 10:08 PM  I have reviewed the triage vital signs and the nursing notes.   HISTORY  Chief Complaint Chest Pain    Level 5 Caveat: Portions of the History and Physical including HPI and review of systems are unable to be completely obtained due to patient being a poor historian   HPI Cloyd Ragas is a 75 y.o. male with a history of Crohn's disease, DVT who comes ED complaining of central chest pain and pain between the shoulder blades that started around noon today.  The upper back pain has been going on for several months, but feels worse than usual.  The chest pain has been constant, waxing and waning, no aggravating or alleviating factors, not exertional.  Rated as 5/10 in intensity.  Feels like burning.  Denies body aches shortness of breath or cough.    History reviewed. No pertinent past medical history.   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 Authorizing Provider  atorvastatin (LIPITOR) 40 MG tablet Take 1 tablet by mouth daily. 08/15/20   [provider]  diazepam (VALIUM) 5 MG tablet Take 1 tablet by mouth 3 (three) times daily.    [provider]  dicyclomine (BENTYL) 20 MG tablet Take by mouth.    [provider]  DULoxetine (CYMBALTA) 20 MG capsule Take 3  capsules by mouth daily.    [provider]  ELIQUIS 5 MG TABS tablet Take 1 tablet by mouth 2 (two) times daily. 08/24/20   [provider]  finasteride (PROSCAR) 5 MG tablet Take 5 mg by mouth daily. 10/04/20   [provider]  gabapentin (NEURONTIN) 100 MG capsule Take 100 mg by mouth 3 (three) times daily. 09/28/20   [provider]  latanoprost (XALATAN) 0.005 % ophthalmic solution Apply to eye.    [provider]  levothyroxine (SYNTHROID) 100 MCG tablet Take 1 tablet by mouth daily. 01/11/20 01/10/21  [provider]  levothyroxine (SYNTHROID, LEVOTHROID) 88 MCG tablet Take 1 tablet by mouth daily.    [provider]  mirtazapine (REMERON) 15 MG tablet Take 3 tablets by mouth daily.    [provider]  Omeprazole 20 MG TBEC Take 1 tablet by mouth daily.    [provider]  oxybutynin (DITROPAN) 5 MG tablet Take 5 mg by mouth 3 (three) times daily. 10/03/20   [provider]  pilocarpine (SALAGEN) 5 MG tablet Take 1 tablet by mouth in the morning, at noon, and at bedtime. 08/11/20 08/11/21  [provider]  rivaroxaban (XARELTO) 20 MG TABS tablet Take 1 tablet by mouth daily. 09/05/14   [provider]  sertraline (ZOLOFT) 100 MG tablet Take 1 tablet by mouth daily. 05/23/20 05/23/21  [provider]  solifenacin (VESICARE) 10 MG tablet Take 1 tablet by mouth daily.    [provider]  sucralfate (Tool)  1 g tablet Take by mouth.    [provider]  tamsulosin (FLOMAX) 0.4 MG CAPS capsule Take 0.4 mg by mouth daily. 08/22/20   [provider]  traZODone (DESYREL) 100 MG tablet Take 100 mg by mouth at bedtime. 08/25/20   [provider]     Allergies Patient has no known allergies.   Family History  Problem Relation Age of Onset   Osteoporosis Mother     Social History Social History   Tobacco Use   Smoking status: Never   Smokeless tobacco:  Never  Vaping Use   Vaping Use: Never used  Substance Use Topics   Alcohol use: No    Alcohol/week: 0.0 standard drinks    Review of Systems Level 5 Caveat: Portions of the History and Physical including HPI and review of systems are unable to be completely obtained due to patient being a poor historian   Constitutional:   No known fever.  ENT:   No rhinorrhea. Cardiovascular:   Positive chest pain without syncope. Respiratory:   No dyspnea or cough. Gastrointestinal:   Negative for abdominal pain, vomiting and diarrhea.  Musculoskeletal:   Negative for focal pain or swelling ____________________________________________   PHYSICAL EXAM:  VITAL SIGNS: ED Triage Vitals  Enc Vitals Group     BP 10/20/20 1943 105/71     Pulse Rate 10/20/20 1943 92     Resp 10/20/20 1943 (!) 26     Temp 10/20/20 1943 99 F (37.2 C)     Temp Source 10/20/20 1943 Oral     SpO2 10/20/20 1943 100 %     Weight 10/20/20 1945 174 lb (78.9 kg)     Height 10/20/20 1945 5\' 8"  (1.727 m)     Head Circumference --      Peak Flow --      Pain Score 10/20/20 1945 5     Pain Loc --      Pain Edu? --      Excl. in [place]? --     Vital signs reviewed, nursing assessments reviewed.   Constitutional:   Alert and oriented. Non-toxic appearance. Eyes:   Conjunctivae are normal. EOMI. PERRL. ENT      Head:   Normocephalic and atraumatic.      Nose:   No congestion/rhinnorhea.       Mouth/Throat:   MMM, no pharyngeal erythema. No peritonsillar mass.       Neck:   No meningismus. Full ROM. Hematological/Lymphatic/Immunilogical:   No cervical lymphadenopathy. Cardiovascular:   RRR. Symmetric bilateral radial and DP pulses.  No murmurs. Cap refill less than 2 seconds. Respiratory:   Normal respiratory effort without tachypnea/retractions. Breath sounds are clear and equal bilaterally. No wheezes/rales/rhonchi. Gastrointestinal:   Soft and nontender. Non distended. There is no CVA tenderness.  No rebound, rigidity,  or guarding. Genitourinary:   deferred Musculoskeletal:   Normal range of motion in all extremities. No joint effusions.  No lower extremity tenderness.  No edema. Neurologic:   Normal speech and language.  Motor grossly intact. No acute focal neurologic deficits are appreciated.  Skin:    Skin is warm, dry and intact. No rash noted.  No petechiae, purpura, or bullae.  ____________________________________________    LABS (pertinent positives/negatives) (all labs ordered are listed, but only abnormal results are displayed) Labs Reviewed  COMPREHENSIVE METABOLIC PANEL - Abnormal; Notable for the following components:      Result Value   Sodium 133 (*)    Glucose, Bld 116 (*)  Calcium 8.1 (*)    Total Protein 5.8 (*)    Albumin 2.7 (*)    Total Bilirubin 1.6 (*)    All other components within normal limits  CBC WITH DIFFERENTIAL/PLATELET - Abnormal; Notable for the following components:   WBC 2.3 (*)    RBC 2.27 (*)    Hemoglobin 8.4 (*)    HCT 25.4 (*)    MCV 111.9 (*)    MCH 37.0 (*)    RDW 18.1 (*)    nRBC 2.2 (*)    Neutro Abs 1.4 (*)    Lymphs Abs 0.4 (*)    Abs Immature Granulocytes 0.19 (*)    All other components within normal limits  TROPONIN I (HIGH SENSITIVITY) - Abnormal; Notable for the following components:   Troponin I (High Sensitivity) 150 (*)    All other components within normal limits  RESP PANEL BY RT-PCR (FLU A&B, COVID) ARPGX2  LIPASE, BLOOD  TROPONIN I (HIGH SENSITIVITY)   ____________________________________________   EKG  Interpreted by me Sinus rhythm rate of 89, normal axis and intervals.  Normal QRS ST segments and T waves.  Occasional APCs.  ____________________________________________    RADIOLOGY  DG Chest 2 View  Result Date: 10/20/2020 CLINICAL DATA:  Chest pain EXAM: CHEST - 2 VIEW COMPARISON:  None. FINDINGS: Small bilateral pleural effusions. No focal consolidation. Normal cardiomediastinal silhouette. No pneumothorax.  Clips over the right chest. Linear atelectasis left base IMPRESSION: Small bilateral pleural effusions. Electronically Signed   By: Donavan Foil M.D.   On: 10/20/2020 20:35    ____________________________________________   PROCEDURES Procedures  ____________________________________________  DIFFERENTIAL DIAGNOSIS   Non-STEMI, aortic dissection, pneumothorax, pleural effusion, pneumonia  CLINICAL IMPRESSION / ASSESSMENT AND PLAN / ED COURSE  Medications ordered in the ED: Medications  nitroGLYCERIN (NITROSTAT) SL tablet 0.4 mg (has no administration in time range)    Pertinent labs & imaging results that were available during my care of the patient were reviewed by me and considered in my medical decision making (see chart for details).   Gordon Farrell was evaluated in Emergency Department on 10/20/2020 for the symptoms described in the history of present illness. He was evaluated in the context of the global COVID-19 pandemic, which necessitated consideration that the patient might be at risk for infection with the SARS-CoV-2 virus that causes COVID-19. Institutional protocols and algorithms that pertain to the evaluation of patients at risk for COVID-19 are in a state of rapid change based on information released by regulatory bodies including the CDC and federal and state organizations. These policies and algorithms were followed during the patient's care in the ED.   Patient presents with severe pain in the chest and upper back.  EKG unremarkable.  Vital signs normal.  Chest x-ray unremarkable.  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  Troponin of 150, no prior results to compare to.  Concern for non-STEMI.  Will obtain CT angiogram of the chest to rule out dissection after which would plan to start heparin and hospitalize for further evaluation.      ____________________________________________   FINAL CLINICAL IMPRESSION(S) / ED DIAGNOSES    Final diagnoses:  Chest pain  with moderate risk for cardiac etiology     ED Discharge Orders     None       Portions of this note were generated with dragon dictation software. Dictation errors may occur despite best attempts at proofreading.   Carrie Mew, MD 10/20/20 2212

## 2020-10-20 NOTE — ED Provider Notes (Signed)
[facility]    CSN: [id number] Arrival date & time: 10/20/20  1819      History   Chief Complaint Chief Complaint  Patient presents with   Back Pain    HPI Gordon Farrell is a 75 y.o. male.   HPI  75 year old male here for evaluation of upper back pain and pain across his chest.  Was called to the room to evaluate patient as his heart rate was all over the place on vital signs monitor and patient has peripheral edema and shortness of breath.  He is also had associated nausea.  He reports the pain going across his upper shoulders and back is an 9-10 over 10 and the pain in his left chest as a 5/10.  He reports the pain in the left side of his chest started around 12 PM.  The pain across his upper shoulders has been going on for 2 to 3 months.  He has not had a cough.  Patient is visibly short of breath and has trouble completing sentences but when patient asked directly he is unsure of how long he has been short of breath.  Patient also has swelling in both of his lower extremities that he is unsure of how long that has been on either.  History reviewed. No pertinent past medical history.  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 Authorizing Provider  atorvastatin (LIPITOR) 40 MG tablet Take 1 tablet by mouth daily. 08/15/20  Yes [provider]  diazepam (VALIUM) 5 MG tablet Take 1 tablet by mouth  3 (three) times daily.   Yes [provider]  dicyclomine (BENTYL) 20 MG tablet Take by mouth.   Yes [provider]  DULoxetine (CYMBALTA) 20 MG capsule Take 3 capsules by mouth daily.   Yes [provider]  ELIQUIS 5 MG TABS tablet Take 1 tablet by mouth 2 (two) times daily. 08/24/20  Yes [provider]  finasteride (PROSCAR) 5 MG tablet Take 5 mg by mouth daily. 10/04/20  Yes [provider]  gabapentin (NEURONTIN) 100 MG capsule Take 100 mg by mouth 3 (three) times daily. 09/28/20  Yes [provider]  latanoprost (XALATAN) 0.005 % ophthalmic solution Apply to eye.   Yes [provider]  levothyroxine (SYNTHROID) 100 MCG tablet Take 1 tablet by mouth daily. 01/11/20 01/10/21 Yes [provider]  mirtazapine (REMERON) 15 MG tablet Take 3 tablets by mouth daily.   Yes [provider]  Omeprazole 20 MG TBEC Take 1 tablet by mouth daily.   Yes [provider]  oxybutynin (DITROPAN) 5 MG tablet Take 5 mg by mouth 3 (three) times daily. 10/03/20  Yes [provider]  pilocarpine (SALAGEN) 5 MG tablet Take 1 tablet by mouth in the morning, at noon, and at bedtime. 08/11/20 08/11/21 Yes [provider]  rivaroxaban (XARELTO) 20 MG TABS tablet Take 1 tablet  by mouth daily. 09/05/14  Yes [provider]  sertraline (ZOLOFT) 100 MG tablet Take 1 tablet by mouth daily. 05/23/20 05/23/21 Yes [provider]  solifenacin (VESICARE) 10 MG tablet Take 1 tablet by mouth daily.   Yes [provider]  sucralfate (CARAFATE) 1 g tablet Take by mouth.   Yes [provider]  tamsulosin (FLOMAX) 0.4 MG CAPS capsule Take 0.4 mg by mouth daily. 08/22/20  Yes [provider]  traZODone (DESYREL) 100 MG tablet Take 100 mg by mouth at bedtime. 08/25/20  Yes [provider]  levothyroxine (SYNTHROID, LEVOTHROID) 88 MCG tablet Take 1 tablet by mouth daily.    [provider]     Family History Family History  Problem Relation Age of Onset   Osteoporosis Mother     Social History Social History   Tobacco Use   Smoking status: Never   Smokeless tobacco: Never  Vaping Use   Vaping Use: Never used  Substance Use Topics   Alcohol use: No    Alcohol/week: 0.0 standard drinks     Allergies   Patient has no known allergies.   Review of Systems Review of Systems  Constitutional:  Negative for activity change and appetite change.  Respiratory:  Positive for shortness of breath. Negative for cough.   Cardiovascular:  Positive for chest pain and leg swelling. Negative for palpitations.  Gastrointestinal:  Positive for nausea.    Physical Exam Triage Vital Signs ED Triage Vitals  Enc Vitals Group     BP 10/20/20 1837 (!) 116/98     Pulse Rate 10/20/20 1837 (!) 50     Resp 10/20/20 1837 18     Temp --      Temp Source 10/20/20 1837 Oral     SpO2 10/20/20 1837 95 %     Weight 10/20/20 1832 174 lb (78.9 kg)     Height 10/20/20 1832 5\' 8"  (1.727 m)     Head Circumference --      Peak Flow --      Pain Score 10/20/20 1831 9     Pain Loc --      Pain Edu? --      Excl. in [place]? --    No data found.  Updated Vital Signs BP (!) 116/98 (BP Location: Left Arm)   Pulse (!) 50   Resp 18   Ht 5\' 8"  (1.727 m)   Wt 174 lb (78.9 kg)   SpO2 95%   BMI 26.46 kg/m   Visual Acuity Right Eye Distance:   Left Eye Distance:   Bilateral Distance:    Right Eye Near:   Left Eye Near:    Bilateral Near:     Physical Exam Vitals and nursing note reviewed.  Constitutional:      General: He is in acute distress.     Appearance: He is ill-appearing.  HENT:     Head: Normocephalic.  Cardiovascular:     Rate and Rhythm: Tachycardia present. Rhythm irregular.     Heart sounds: No murmur heard.   No gallop.  Pulmonary:     Effort: Respiratory distress present.     Breath sounds: No wheezing, rhonchi or rales.  Skin:    General: Skin is warm and dry.      Capillary Refill: Capillary refill takes less than 2 seconds.  Neurological:     General: No focal deficit present.     Mental Status: He is alert and oriented to person, place, and  time.  Psychiatric:        Mood and Affect: Mood normal.        Behavior: Behavior normal.        Thought Content: Thought content normal.        Judgment: Judgment normal.     UC Treatments / Results  Labs (all labs ordered are listed, but only abnormal results are displayed) Labs Reviewed - No data to display  EKG   Radiology No results found.  Procedures Procedures (including critical care time)  Medications Ordered in UC Medications - No data to display  Initial Impression / Assessment and Plan / UC Course  I have reviewed the triage vital signs and the nursing notes.  Pertinent labs & imaging results that were available during my care of the patient were reviewed by me and considered in my medical decision making (see chart for details).  Patient is a weak, ill-appearing 75 year old male here for evaluation of upper back pain this been going for 2 to 3 months and chest pain including left-sided chest pain that started 12:00 or so this afternoon.  He has had some shortness of breath and nausea associated with this.  Patient reports that his upper back pain has been going on since he had a lipoma removal 2 to 3 months ago.  Patient physical exam reveals a pale gentleman who is visibly short of breath and had trouble speaking in full sentences.  Peripheral pulses are irregular and thready.  Heart sounds reveal an irregular and rapid heart rate.  Lung sounds are clear to auscultation in all fields.  Patient has 3+ pitting edema from ankles to mid shin bilaterally.  EKG obtained.  EKG shows atrial fibrillation with RVR and a rate of 107.  Patient denies any previous history of atrial fibrillation.  He is on Eliquis for history of DVT.  Due to new onset atrial fibrillation with RVR I have  discussed with patient that he needs to be evaluated in the emergency department and most likely admitted to the hospital for stabilization and he is in agreement.  Patient is typically seen at [facility] but informed him that he will most likely be evaluated at [facility] and they can transfer him later as needed.  Due to the fact that patient is on Eliquis we will hold on aspirin at this time.  EMS called to transport patient to hospital.  Report given to EMS crew from [facility].  Care transferred.   Final Clinical Impressions(s) / UC Diagnoses   Final diagnoses:  Atrial fibrillation with RVR [facility])     Discharge Instructions      Please go to the emergency department at [facility] for evaluation of your abnormal heart rhythm.     ED Prescriptions   None    PDMP not reviewed this encounter.   Margarette Canada, NP 10/20/20 1911

## 2020-10-20 NOTE — ED Notes (Signed)
Patient is being discharged from the Urgent Care and sent to the Emergency Department via EMS . Per Charmayne Sheer, patient is in need of higher level of care due to Atrial Fib with RVR. Patient is aware and verbalizes understanding of plan of care.  Vitals:   10/20/20 1837  BP: (!) 116/98  Pulse: (!) 50  Resp: 18  SpO2: 95%

## 2020-10-20 NOTE — ED Notes (Signed)
Pt provided with mouth moisterizer.

## 2020-10-20 NOTE — ED Triage Notes (Signed)
  Patient BIB EMS for chest pain that started earlier today.  Chest pain started around 1400 this afternoon and patient went to UC to get checked out.  Patient has hx of back pain and states the pain started coming around the right side of his chest and felt like tingling.  No nausea/vomiting, no jaw/shoulder/neck pain, was sweating at [facility] but thinks its unrelated.  Pain 5/10 in chest.

## 2020-10-21 ENCOUNTER — Observation Stay
Admit: 2020-10-21 | Discharge: 2020-10-21 | Disposition: A | Discharge status: Home / Self Care | Payer: Medicare Other | Attending: Internal Medicine | Admitting: Internal Medicine

## 2020-10-21 ENCOUNTER — Encounter: Payer: Self-pay | Admitting: Internal Medicine

## 2020-10-21 DIAGNOSIS — I480 Paroxysmal atrial fibrillation: Secondary | ICD-10-CM | POA: Diagnosis not present

## 2020-10-21 DIAGNOSIS — K449 Diaphragmatic hernia without obstruction or gangrene: Secondary | ICD-10-CM | POA: Diagnosis present

## 2020-10-21 DIAGNOSIS — D61818 Other pancytopenia: Secondary | ICD-10-CM | POA: Diagnosis present

## 2020-10-21 DIAGNOSIS — E876 Hypokalemia: Secondary | ICD-10-CM | POA: Diagnosis not present

## 2020-10-21 DIAGNOSIS — Z96649 Presence of unspecified artificial hip joint: Secondary | ICD-10-CM | POA: Diagnosis present

## 2020-10-21 DIAGNOSIS — E039 Hypothyroidism, unspecified: Secondary | ICD-10-CM | POA: Diagnosis present

## 2020-10-21 DIAGNOSIS — I4891 Unspecified atrial fibrillation: Secondary | ICD-10-CM | POA: Diagnosis present

## 2020-10-21 DIAGNOSIS — I2510 Atherosclerotic heart disease of native coronary artery without angina pectoris: Secondary | ICD-10-CM | POA: Diagnosis present

## 2020-10-21 DIAGNOSIS — R1310 Dysphagia, unspecified: Secondary | ICD-10-CM

## 2020-10-21 DIAGNOSIS — R1319 Other dysphagia: Secondary | ICD-10-CM

## 2020-10-21 DIAGNOSIS — K5090 Crohn's disease, unspecified, without complications: Secondary | ICD-10-CM | POA: Diagnosis present

## 2020-10-21 DIAGNOSIS — K2289 Other specified disease of esophagus: Secondary | ICD-10-CM

## 2020-10-21 DIAGNOSIS — M25511 Pain in right shoulder: Secondary | ICD-10-CM | POA: Diagnosis present

## 2020-10-21 DIAGNOSIS — Z86718 Personal history of other venous thrombosis and embolism: Secondary | ICD-10-CM | POA: Diagnosis not present

## 2020-10-21 DIAGNOSIS — I509 Heart failure, unspecified: Secondary | ICD-10-CM | POA: Diagnosis not present

## 2020-10-21 DIAGNOSIS — R079 Chest pain, unspecified: Secondary | ICD-10-CM | POA: Diagnosis present

## 2020-10-21 DIAGNOSIS — Z7901 Long term (current) use of anticoagulants: Secondary | ICD-10-CM | POA: Diagnosis not present

## 2020-10-21 DIAGNOSIS — Z79890 Hormone replacement therapy (postmenopausal): Secondary | ICD-10-CM | POA: Diagnosis not present

## 2020-10-21 DIAGNOSIS — F32A Depression, unspecified: Secondary | ICD-10-CM | POA: Diagnosis present

## 2020-10-21 DIAGNOSIS — G8929 Other chronic pain: Secondary | ICD-10-CM | POA: Diagnosis present

## 2020-10-21 DIAGNOSIS — K2270 Barrett's esophagus without dysplasia: Secondary | ICD-10-CM | POA: Diagnosis present

## 2020-10-21 DIAGNOSIS — I5031 Acute diastolic (congestive) heart failure: Secondary | ICD-10-CM | POA: Diagnosis not present

## 2020-10-21 DIAGNOSIS — N400 Enlarged prostate without lower urinary tract symptoms: Secondary | ICD-10-CM | POA: Diagnosis present

## 2020-10-21 DIAGNOSIS — Z9049 Acquired absence of other specified parts of digestive tract: Secondary | ICD-10-CM | POA: Diagnosis not present

## 2020-10-21 DIAGNOSIS — I5033 Acute on chronic diastolic (congestive) heart failure: Secondary | ICD-10-CM | POA: Diagnosis present

## 2020-10-21 DIAGNOSIS — Z20822 Contact with and (suspected) exposure to covid-19: Secondary | ICD-10-CM | POA: Diagnosis present

## 2020-10-21 DIAGNOSIS — Z79899 Other long term (current) drug therapy: Secondary | ICD-10-CM | POA: Diagnosis not present

## 2020-10-21 LAB — TSH: TSH: 4.734 u[IU]/mL — ABNORMAL HIGH (ref 0.350–4.500)

## 2020-10-21 LAB — HEPARIN LEVEL (UNFRACTIONATED): Heparin Unfractionated: >1.1 IU/mL — ABNORMAL HIGH (ref 0.30–0.70)

## 2020-10-21 LAB — ECHOCARDIOGRAM COMPLETE
AR max vel: 2.86 cm2
AV Peak grad: 4.5 mmHg
Ao pk vel: 1.06 m/s
Area-P 1/2: 3.58 cm2
Height: 68 in
S' Lateral: 3.5 cm
Weight: 2920 oz

## 2020-10-21 LAB — PROTIME-INR
INR: 1.8 — ABNORMAL HIGH (ref 0.8–1.2)
Prothrombin Time: 20.8 seconds — ABNORMAL HIGH (ref 11.4–15.2)

## 2020-10-21 LAB — APTT
aPTT: 63 seconds — ABNORMAL HIGH (ref 24–36)
aPTT: 92 seconds — ABNORMAL HIGH (ref 24–36)
aPTT: 128 seconds — ABNORMAL HIGH (ref 24–36)

## 2020-10-21 LAB — IRON AND TIBC
Iron: 41 ug/dL — ABNORMAL LOW (ref 45–182)
Saturation Ratios: 21 % (ref 17.9–39.5)
TIBC: 197 ug/dL — ABNORMAL LOW (ref 250–450)
UIBC: 156 ug/dL

## 2020-10-21 LAB — TROPONIN I (HIGH SENSITIVITY)
Troponin I (High Sensitivity): 116 ng/L (ref <18)
Troponin I (High Sensitivity): 103 ng/L (ref <18)

## 2020-10-21 LAB — VITAMIN B12: Vitamin B-12: 1280 pg/mL — ABNORMAL HIGH (ref 180–914)

## 2020-10-21 MED ORDER — SODIUM CHLORIDE 0.9% FLUSH
3.0000 mL | Freq: Two times a day (BID) | INTRAVENOUS | Status: DC
  Administered 2020-10-21 – 2020-10-23 (×4): 3 mL via INTRAVENOUS

## 2020-10-21 MED ORDER — FUROSEMIDE 10 MG/ML IJ SOLN
20.0000 mg | Freq: Every day | INTRAMUSCULAR | Status: DC
  Administered 2020-10-21: 20 mg via INTRAVENOUS
  Filled 2020-10-21: qty 2

## 2020-10-21 MED ORDER — HEPARIN (PORCINE) 25000 UT/250ML-% IV SOLN
1100.0000 [IU]/h | INTRAVENOUS | Status: DC
  Administered 2020-10-21: 1000 [IU]/h via INTRAVENOUS
  Administered 2020-10-21: 1150 [IU]/h via INTRAVENOUS
  Administered 2020-10-22: 1100 [IU]/h via INTRAVENOUS
  Filled 2020-10-21 (×3): qty 250

## 2020-10-21 MED ORDER — PANTOPRAZOLE SODIUM 40 MG IV SOLR
40.0000 mg | INTRAVENOUS | Status: DC
  Administered 2020-10-21: 40 mg via INTRAVENOUS
  Filled 2020-10-21: qty 40

## 2020-10-21 MED ORDER — PANTOPRAZOLE SODIUM 40 MG IV SOLR
40.0000 mg | Freq: Two times a day (BID) | INTRAVENOUS | Status: DC
  Administered 2020-10-21 – 2020-10-23 (×4): 40 mg via INTRAVENOUS
  Filled 2020-10-21 (×4): qty 40

## 2020-10-21 MED ORDER — PILOCARPINE HCL 5 MG PO TABS
5.0000 mg | ORAL_TABLET | Freq: Three times a day (TID) | ORAL | Status: DC
  Administered 2020-10-21 – 2020-10-23 (×5): 5 mg via ORAL
  Filled 2020-10-21 (×8): qty 1

## 2020-10-21 MED ORDER — METOPROLOL TARTRATE 25 MG PO TABS
25.0000 mg | ORAL_TABLET | Freq: Two times a day (BID) | ORAL | Status: DC
  Administered 2020-10-21 – 2020-10-23 (×5): 25 mg via ORAL
  Filled 2020-10-21 (×6): qty 1

## 2020-10-21 NOTE — Progress Notes (Signed)
ANTICOAGULATION CONSULT NOTE   Pharmacy Consult for heparin infusion Indication: ACS/nSTEMI  No Known Allergies  Patient Measurements: Height: 5\' 8"  (172.7 cm) Weight: 82.8 kg (182 lb 8 oz) IBW/kg (Calculated) : 68.4 Heparin Dosing Weight: 82.8 kg  Vital Signs: Temp: 98.3 F (36.8 C) (06/18 1800) Temp Source: Oral (06/18 1652) BP: 105/64 (06/18 1800) Pulse Rate: 70 (06/18 1800)  Labs: Recent Labs    10/20/20 1949 10/20/20 2209 10/21/20 0219 10/21/20 0613 10/21/20 0915 10/21/20 1706  HGB 8.4*  --   --   --   --   --   HCT 25.4*  --   --   --   --   --   PLT 170  --   --   --   --   --   APTT  --   --  63*  --  92* 128*  LABPROT  --   --  20.8*  --   --   --   INR  --   --  1.8*  --   --   --   HEPARINUNFRC  --   --  >1.10*  --   --   --   CREATININE 1.08  --   --   --   --   --   TROPONINIHS 150* 135* 116* 103*  --   --      Estimated Creatinine Clearance: 62 mL/min (by C-G formula based on SCr of 1.08 mg/dL).   Medical History: Past Medical History:  Diagnosis Date   Anemia    Hypothyroidism     Medications:  Pt on Eliquis 5 mg BID  Assessment: Pt is 75 yo male presenting to ER from urgent care for evaluation of chest pain.  6/18 @ 0915 aPTT= 92 therapeutic.  Cont @ 1150 untis/hr 6/18 @ 1706 aPTT= 128, supratherapeutic. Decrease to 1000 units/hr  Goal of Therapy:  Heparin level 0.3-0.7 units/ml Monitor platelets by anticoagulation protocol: Yes Target aPTT 66-102   Plan:  6/18 @ 1706 aPTT= 128, supratherapeutic   Decrease heparin infusion to rate of 1000 units/hr Due to PTA DOAC, will follow aPTT until correlation w/ HL F/u aPTT in 8 hrs and HL with am labs Daily HL and CBC while on heparin.  Darnelle Bos, PharmD Clinical Pharmacist 10/21/2020

## 2020-10-21 NOTE — H&P (Addendum)
History and Physical    Gordon Farrell FWY:[id number] DOB: 28-Jun-1945 DOA: 10/20/2020  PCP: Danae Orleans, MD   Patient coming from: Home  I have personally briefly reviewed patient's old medical records in [place]  Chief Complaint: Chest pain/back pain  HPI: Gordon Farrell is a 75 y.o. male with medical history significant for Crohn's disease, DVT, hypothyroidism and BPH who presents to the emergency room from the urgent care for evaluation of chest pain.  Patient states that he had a mass excised from his back, underneath his right scapula and over the last couple of days has had pain in the area of his excision.  He states that the pain radiated to the left side of his chest and across to the left anterior chest wall.  He rated his pain a 5 x 10 in intensity at its worst and denied having any associated nausea, no vomiting, no diaphoresis, no palpitations.  He said he had some associated shortness of breath.  His pain was initially relieved with acetaminophen but on the day of admission he developed chest pain again over the left anterior chest wall and decided to go to the urgent care to get checked He was referred to the emergency room for further evaluation. And elevate feet at the urgent care with a T-max of 100 F, he appears congested and has a cough that is nonproductive.  He also complains of difficulty swallowing but denies having any pain. He is vaccinated against the COVID-19 virus. He denies having any abdominal pain, no dizziness, no lightheadedness, no headache, no urinary symptoms, no changes in his bowel habits, no headache, no blurred vision, no  focal deficits. Labs show sodium 133, potassium 3.7, chloride 106, bicarb 22, glucose 116, BUN 16, creatinine 1.08, calcium 8.1, alkaline phosphatase 81, albumin 2.7, lipase 28, AST 24, ALT 13, total protein 5.8, total bilirubin 1.6, troponin 150 >> 135, white count 2.3, hemoglobin 8.4, hematocrit 25.4, MCV 111, RDW 18, platelet  count 270 Respiratory viral panel is negative Chest x-ray reviewed by me shows small bilateral pleural effusions. CT angiogram of the chest shows no aortic dissection or acute aortic abnormality. Mild aortic atherosclerosis. No pulmonary embolus. Small bilateral pleural effusions with adjacent compressive atelectasis. Mild central bronchial thickening. Mild esophageal wall thickening with question of paraesophageal stranding and luminal debris in the mid esophagus. Query esophagitis. Endoscopy may be of value if not recently performed for more detailed esophageal assessment. Right ureteral stent in place with 4 mm stone in the right distal ureter adjacent to the stent. Likely tiny stone fragment slightly more proximally. Mild right hydronephrosis and hydroureter despite stent placement. Additional nonobstructing stones in both kidneys. Mild splenomegaly. Mild anterior wedging of T1, T3, and T12 vertebral bodies, no adjacent soft tissue thickening or inflammation to suggest acuity. Fluid/liquid stool in the right colon, can be seen with diarrheal illness. No colonic wall thickening or inflammation. Aortic Atherosclerosis  Twelve-lead EKG reviewed by me shows atrial fibrillation.    ED Course: Patient is a 75 year old male who was sent to the emergency room for evaluation of left anterior chest pain associated with shortness of breath and leg swelling. Patient bumped his troponin and imaging shows bilateral pleural effusions. He will be referred to observation status for further evaluation.   Review of Systems: As per HPI otherwise all other systems reviewed and negative.    Past Medical History:  Diagnosis Date   Anemia    Hypothyroidism     Past Surgical History:  Procedure Laterality Date   BACK SURGERY     CHOLECYSTECTOMY     COLON RESECTION     multiple times   TOTAL HIP ARTHROPLASTY  05/06/2010     reports that he has never smoked. He has never used smokeless tobacco. He  reports that he does not drink alcohol. No history on file for drug use.  No Known Allergies  Family History  Problem Relation Age of Onset   Osteoporosis Mother       Prior to Admission medications   Medication Sig Start Date End Date Taking? Authorizing Provider  atorvastatin (LIPITOR) 40 MG tablet Take 1 tablet by mouth daily. 08/15/20   [provider]  diazepam (VALIUM) 5 MG tablet Take 1 tablet by mouth 3 (three) times daily.    [provider]  dicyclomine (BENTYL) 20 MG tablet Take by mouth.    [provider]  DULoxetine (CYMBALTA) 20 MG capsule Take 3 capsules by mouth daily.    [provider]  ELIQUIS 5 MG TABS tablet Take 1 tablet by mouth 2 (two) times daily. 08/24/20   [provider]  finasteride (PROSCAR) 5 MG tablet Take 5 mg by mouth daily. 10/04/20   [provider]  gabapentin (NEURONTIN) 100 MG capsule Take 100 mg by mouth 3 (three) times daily. 09/28/20   [provider]  latanoprost (XALATAN) 0.005 % ophthalmic solution Apply to eye.    [provider]  levothyroxine (SYNTHROID) 100 MCG tablet Take 1 tablet by mouth daily. 01/11/20 01/10/21  [provider]  mirtazapine (REMERON) 15 MG tablet Take 3 tablets by mouth daily.    [provider]  Omeprazole 20 MG TBEC Take 1 tablet by mouth daily.    [provider]  oxybutynin (DITROPAN) 5 MG tablet Take 5 mg by mouth 3 (three) times daily. 10/03/20   [provider]  pilocarpine (SALAGEN) 5 MG tablet Take 1 tablet by mouth in the morning, at noon, and at bedtime. 08/11/20 08/11/21  [provider]  sertraline (ZOLOFT) 100 MG tablet Take 1 tablet by mouth daily. 05/23/20 05/23/21  [provider]  solifenacin (VESICARE) 10 MG tablet Take 1 tablet by mouth daily.    [provider]  sucralfate (CARAFATE) 1 g tablet Take by mouth.    [provider]  tamsulosin (FLOMAX) 0.4 MG CAPS capsule Take  0.4 mg by mouth daily. 08/22/20   [provider]  traZODone (DESYREL) 100 MG tablet Take 100 mg by mouth at bedtime. 08/25/20   [provider]    Physical Exam: Vitals:   10/20/20 2320 10/20/20 2355 10/21/20 0020 10/21/20 0038  BP:  106/67  (!) 143/76  Pulse: (!) 117 84  84  Resp: (!) 27   17  Temp:  98.2 F (36.8 C)  98.7 F (37.1 C)  TempSrc:  Oral    SpO2: 100% 95%  100%  Weight:   82.8 kg   Height:   5\' 8"  (1.727 m)      Vitals:   10/20/20 2320 10/20/20 2355 10/21/20 0020 10/21/20 0038  BP:  106/67  (!) 143/76  Pulse: (!) 117 84  84  Resp: (!) 27   17  Temp:  98.2 F (36.8 C)  98.7 F (37.1 C)  TempSrc:  Oral    SpO2: 100% 95%  100%  Weight:   82.8 kg   Height:   5\' 8"  (1.727 m)       Constitutional: Alert and oriented  x 3 . Not in any apparent distress HEENT:      Head: Normocephalic and atraumatic.         Eyes: PERLA, EOMI, Conjunctivae pallor. Sclera is non-icteric.       Mouth/Throat: Mucous membranes are moist.       Neck: Supple with no signs of meningismus. Cardiovascular: Irregularly irregular. No murmurs, gallops, or rubs. 2+ symmetrical distal pulses are present . No JVD. 1+LE edema Respiratory: Respiratory effort normal .crackles at the bases of both lungs bilaterally. No wheezes, crackles, or rhonchi.  Gastrointestinal: Soft, non tender, and non distended with positive bowel sounds.  Genitourinary: No CVA tenderness. Musculoskeletal: Nontender with normal range of motion in all extremities. No cyanosis, or erythema of extremities. Neurologic:  Face is symmetric. Moving all extremities. No gross focal neurologic deficits . Skin: Skin is warm, dry.  No rash or ulcers Psychiatric: Depressed mood and flat affect    Labs on Admission: I have personally reviewed following labs and imaging studies  CBC: Recent Labs  Lab 10/20/20 1949  WBC 2.3*  NEUTROABS 1.4*  HGB 8.4*  HCT 25.4*  MCV 111.9*  PLT 381   Basic Metabolic  Panel: Recent Labs  Lab 10/20/20 1949  NA 133*  K 3.7  CL 106  CO2 22  GLUCOSE 116*  BUN 16  CREATININE 1.08  CALCIUM 8.1*   GFR: Estimated Creatinine Clearance: 62 mL/min (by C-G formula based on SCr of 1.08 mg/dL). Liver Function Tests: Recent Labs  Lab 10/20/20 1949  AST 24  ALT 13  ALKPHOS 81  BILITOT 1.6*  PROT 5.8*  ALBUMIN 2.7*   Recent Labs  Lab 10/20/20 1949  LIPASE 28   No results for input(s): AMMONIA in the last 168 hours. Coagulation Profile: No results for input(s): INR, PROTIME in the last 168 hours. Cardiac Enzymes: No results for input(s): CKTOTAL, CKMB, CKMBINDEX, TROPONINI in the last 168 hours. BNP (last 3 results) No results for input(s): PROBNP in the last 8760 hours. HbA1C: No results for input(s): HGBA1C in the last 72 hours. CBG: No results for input(s): GLUCAP in the last 168 hours. Lipid Profile: No results for input(s): CHOL, HDL, LDLCALC, TRIG, CHOLHDL, LDLDIRECT in the last 72 hours. Thyroid Function Tests: No results for input(s): TSH, T4TOTAL, FREET4, T3FREE, THYROIDAB in the last 72 hours. Anemia Panel: No results for input(s): VITAMINB12, FOLATE, FERRITIN, TIBC, IRON, RETICCTPCT in the last 72 hours. Urine analysis: No results found for: COLORURINE, APPEARANCEUR, LABSPEC, PHURINE, GLUCOSEU, HGBUR, BILIRUBINUR, KETONESUR, PROTEINUR, UROBILINOGEN, NITRITE, LEUKOCYTESUR  Radiological Exams on Admission: DG Chest 2 View  Result Date: 10/20/2020 CLINICAL DATA:  Chest pain EXAM: CHEST - 2 VIEW COMPARISON:  None. FINDINGS: Small bilateral pleural effusions. No focal consolidation. Normal cardiomediastinal silhouette. No pneumothorax. Clips over the right chest. Linear atelectasis left base IMPRESSION: Small bilateral pleural effusions. Electronically Signed   By: Donavan Foil M.D.   On: 10/20/2020 20:35   CT Angio Chest/Abd/Pel for Dissection W and/or Wo Contrast  Result Date: 10/20/2020 CLINICAL DATA:  Chest pain today. EXAM: CT  ANGIOGRAPHY CHEST, ABDOMEN AND PELVIS TECHNIQUE: Non-contrast CT of the chest was initially obtained. Multidetector CT imaging through the chest, abdomen and pelvis was performed using the standard protocol during bolus administration of intravenous contrast. Multiplanar reconstructed images and MIPs were obtained and reviewed to evaluate the vascular anatomy. CONTRAST:  128mL OMNIPAQUE IOHEXOL 350 MG/ML SOLN COMPARISON:  Radiograph earlier today. FINDINGS: CTA CHEST FINDINGS Cardiovascular: No aortic hematoma noncontrast exam. Thoracic aorta is  normal in caliber. No aneurysm. No dissection, evidence of acute aortic syndrome or vasculitis. The pulmonary arteries are well opacified to the subsegmental level. There is no pulmonary embolus. Heart is normal in size. Trace pericardial effusion. Mild coronary artery calcifications. Mediastinum/Nodes: Calcified left hilar lymph nodes consistent with prior granulomatous disease. No noncalcified adenopathy. Scattered small mediastinal lymph nodes are not enlarged by size criteria. Mild esophageal wall thickening with questionable paraesophageal edema and intraluminal debris in the mid esophagus. No pneumomediastinum. No thyroid nodule. Lungs/Pleura: Small bilateral pleural effusions with adjacent compressive atelectasis. Left lower lobe calcification may be a calcified granuloma or pleural calcification. Mild central bronchial thickening. No pneumothorax. No pulmonary mass. Musculoskeletal: Mild anterior wedging of T1, T3, and T12 vertebral bodies, no adjacent soft tissue thickening or inflammation to suggest acuity. No focal bone lesion. Mild multilevel thoracic spondylosis with endplate spurring. Surgical clips posterior to the right scapula. Review of the MIP images confirms the above findings. CTA ABDOMEN AND PELVIS FINDINGS VASCULAR Aorta: Normal caliber aorta without aneurysm, dissection, vasculitis or significant stenosis. Mild aortic atherosclerosis. Celiac: Patent  without evidence of aneurysm, dissection, vasculitis or significant stenosis. Separate origins of the right left hepatic artery from the celiac axis. SMA: Patent without evidence of aneurysm, dissection, vasculitis or significant stenosis. Renals: Both renal arteries are patent without evidence of aneurysm, dissection, vasculitis, fibromuscular dysplasia or significant stenosis. IMA: Patent without evidence of aneurysm, dissection, vasculitis or significant stenosis. Inflow: Patent without evidence of aneurysm, dissection, vasculitis or significant stenosis. Veins: No obvious venous abnormality within the limitations of this arterial phase study. Review of the MIP images confirms the above findings. NON-VASCULAR Hepatobiliary: No evidence of focal liver abnormality on this arterial exam. Post cholecystectomy. No biliary dilatation. Pancreas: No ductal dilatation or inflammation. Spleen: Mildly enlarged spanning 13.8 cm cranial caudal. No focal abnormality on arterial phase imaging. Adrenals/Urinary Tract: No adrenal nodule. Right ureteral stent in place with pigtail in the right renal pelvis and urinary bladder. 4 mm stone in the right distal ureter adjacent to the stent, series 5, image 175. Likely tiny stone fragment slightly more proximally, series 5, image 170. Mild stranding about the distal aspect of the ureter and stent. There is mild right hydronephrosis and hydroureter despite stent placement. Nonobstructing 8 mm stone in the upper right kidney. Punctate nonobstructing stone in the mid right kidney. 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 There are multiple nonobstructing intrarenal left renal calculi. Mild thinning of the left renal parenchyma. Small cyst arises from the lower left kidney. There is no left hydronephrosis. No left ureteral stones. Urinary bladder is partially distended, no stones in the bladder. No bladder wall thickening. Stomach/Bowel: Stomach is decompressed.  Normal positioning of the duodenum and ligament of Treitz. No small bowel obstruction or inflammation. Normal appendix tentatively visualized. Fluid/liquid stool in the right colon. Air-filled transverse, descending, and proximal sigmoid colon. There is no colonic wall thickening or inflammation. No bowel obstruction. Lymphatic: No abdominopelvic adenopathy. Reproductive: Prostate gland not seen, atrophic or surgically absent. Other: Postsurgical change of the anterior abdominal wall. No ascites. No free air. No focal fluid collection. Bilateral fat containing inguinal hernias. Musculoskeletal: Right hip arthroplasty. Moderate degenerative change of the left hip. Degenerative change throughout the lumbar spine without acute lumbar abnormality. Review of the MIP images confirms the above findings. IMPRESSION: 1. No aortic dissection or acute aortic abnormality. Mild aortic atherosclerosis. No pulmonary embolus. 2. Small bilateral pleural effusions with adjacent compressive atelectasis. Mild central bronchial thickening. 3.  Mild esophageal wall thickening with question of paraesophageal stranding and luminal debris in the mid esophagus. Query esophagitis. Endoscopy may be of value if not recently performed for more detailed esophageal assessment. 4. Right ureteral stent in place with 4 mm stone in the right distal ureter adjacent to the stent. Likely tiny stone fragment slightly more proximally. Mild right hydronephrosis and hydroureter despite stent placement. 5. Additional nonobstructing stones in both kidneys. 6. Mild splenomegaly. 7. Mild anterior wedging of T1, T3, and T12 vertebral bodies, no adjacent soft tissue thickening or inflammation to suggest acuity. 8. Fluid/liquid stool in the right colon, can be seen with diarrheal illness. No colonic wall thickening or inflammation. Aortic Atherosclerosis (ICD10-I70.0). Electronically Signed   By: Keith Rake M.D.   On: 10/20/2020 23:10      Assessment/Plan Principal Problem:   Chest pain Active Problems:   Acquired hypothyroidism   Deep vein thrombosis (DVT) (HCC)   Unspecified atrial fibrillation (HCC)   Acute CHF (congestive heart failure) (HCC)   Dysphagia     Chest pain Unclear etiology Rule out acute coronary syndrome Initial serum troponin was elevated Cycle cardiac enzymes Continue heparin drip Continue high intensity statins and aspirin Start patient on low-dose beta-blocker Obtain 2D echocardiogram to assess LVEF and rule out regional wall motion abnormality      New onset atrial fibrillation Patient has a CHADS2 vascular score of 3 and ideally requires anticoagulation as primary prophylaxis for an acute stroke Start patient on beta-blocker for rate control Obtain TSH Patient is already on Eliquis for history of DVT     Hypothyroidism Continue Synthroid    Depression Continue mirtazapine, trazodone, Zoloft and Cymbalta    Dysphagia  With concerns for possible esophagitis We will place patient on IV Protonix Continue Carafate We will consult GI     BPH Continue Flomax and finasteride     Acute CHF Probably rate related, due to new onset A. fib Will start patient on Lasix 20 mg IV daily Start low-dose beta-blocker Follow-up results of 2D echo to assess LVEF and rule out regional wall motion abnormality  DVT prophylaxis: Heparin Code Status: full code  Family Communication: Greater than 50% of time was spent discussing patient's condition and plan of care with him at the bedside.  All questions and concerns have been addressed.  He verbalizes understanding and agrees to the plan Disposition Plan: Back to previous home environment Consults called: Cardiology Status: Observation    Milanya Sunderland MD [facility]     10/21/2020, 1:01 AM

## 2020-10-21 NOTE — Progress Notes (Signed)
*  PRELIMINARY RESULTS* Echocardiogram 2D Echocardiogram has been performed.  Gordon Farrell 10/21/2020, 9:02 AM

## 2020-10-21 NOTE — Progress Notes (Signed)
ANTICOAGULATION CONSULT NOTE   Pharmacy Consult for heparin infusion Indication: ACS/nSTEMI  No Known Allergies  Patient Measurements: Height: 5\' 8"  (172.7 cm) Weight: 82.8 kg (182 lb 8 oz) IBW/kg (Calculated) : 68.4 Heparin Dosing Weight: 82.8 kg  Vital Signs: Temp: 98.7 F (37.1 C) (06/18 0934) Temp Source: Oral (06/18 0934) BP: 104/60 (06/18 0934) Pulse Rate: 84 (06/18 0934)  Labs: Recent Labs    10/20/20 1949 10/20/20 2209 10/21/20 0219 10/21/20 0613 10/21/20 0915  HGB 8.4*  --   --   --   --   HCT 25.4*  --   --   --   --   PLT 170  --   --   --   --   APTT  --   --  63*  --  92*  LABPROT  --   --  20.8*  --   --   INR  --   --  1.8*  --   --   HEPARINUNFRC  --   --  >1.10*  --   --   CREATININE 1.08  --   --   --   --   TROPONINIHS 150* 135* 116* 103*  --      Estimated Creatinine Clearance: 62 mL/min (by C-G formula based on SCr of 1.08 mg/dL).   Medical History: Past Medical History:  Diagnosis Date   Anemia    Hypothyroidism     Medications:  Pt on Eliquis 5 mg BID  Assessment: Pt is 75 yo male presenting to ER from urgent care for evaluation of chest pain.  6/18 @ 0915 aPTT= 92 therapeutic.  Cont @ 1150 untis/hr  Goal of Therapy:  Heparin level 0.3-0.7 units/ml Monitor platelets by anticoagulation protocol: Yes Target aPTT 66-102   Plan:  6/18 @ 0915 aPTT= 92 therapeutic   Continue heparin infusion at rate of 1150 units/hr Due to PTA DOAC, will follow aPTT until correlation w/ HL F/u aPTT in 8 hrs and HL with am labs Daily HL and CBC while on heparin.  Chinita Greenland PharmD Clinical Pharmacist 10/21/2020

## 2020-10-21 NOTE — Progress Notes (Addendum)
ANTICOAGULATION CONSULT NOTE   Pharmacy Consult for heparin infusion Indication: ACS/nSTEMI  No Known Allergies  Patient Measurements: Height: 5\' 8"  (172.7 cm) Weight: 82.8 kg (182 lb 8 oz) IBW/kg (Calculated) : 68.4 Heparin Dosing Weight: 82.8 kg  Vital Signs: Temp: 98.7 F (37.1 C) (06/18 0038) Temp Source: Oral (06/17 2355) BP: 143/76 (06/18 0038) Pulse Rate: 84 (06/18 0038)  Labs: Recent Labs    10/20/20 1949 10/20/20 2209 10/21/20 0219  HGB 8.4*  --   --   HCT 25.4*  --   --   PLT 170  --   --   APTT  --   --  63*  LABPROT  --   --  20.8*  INR  --   --  1.8*  HEPARINUNFRC  --   --  >1.10*  CREATININE 1.08  --   --   TROPONINIHS 150* 135* 116*    Estimated Creatinine Clearance: 62 mL/min (by C-G formula based on SCr of 1.08 mg/dL).   Medical History: Past Medical History:  Diagnosis Date   Anemia    Hypothyroidism     Medications:  Pt on Eliquis 5 mg BID  Assessment: Pt is 75 yo male presenting to ER from urgent care for evaluation of chest pain.  Goal of Therapy:  Heparin level 0.3-0.7 units/ml Monitor platelets by anticoagulation protocol: Yes Target aPTT 66-102   Plan:  No initial bolus due to unknown time of last dose of Eliquis Start heparin infusion at rate of 1150 units/hr Due to PTA DOAC, will follow aPTT until correlation w/ HL Check aPTT 8 hrs after start of infusion Daily HL and CBC while on heparin.  Renda Rolls, PharmD, [facility] 10/21/2020 3:12 AM

## 2020-10-21 NOTE — Consult Note (Signed)
Gordon Farrell is a 75 y.o. male  [id number]  Primary Cardiologist: Neoma Laming Reason for Consultation: Chest pain and elevated troponin  HPI: This is a 75 year old white male with past medical history of anemia hypothyroidism on Eliquis presented with chest pain described as pressure type radiating to the back.  He had elevated troponin thus I was asked to evaluate the patient   Review of Systems: No orthopnea PND or leg swelling   Past Medical History:  Diagnosis Date   Anemia    Hypothyroidism     Medications Prior to Admission  Medication Sig Dispense Refill   atorvastatin (LIPITOR) 40 MG tablet Take 1 tablet by mouth daily.     diazepam (VALIUM) 5 MG tablet Take 1 tablet by mouth 3 (three) times daily.     dicyclomine (BENTYL) 20 MG tablet Take by mouth.     DULoxetine (CYMBALTA) 20 MG capsule Take 3 capsules by mouth daily.     ELIQUIS 5 MG TABS tablet Take 1 tablet by mouth 2 (two) times daily.     finasteride (PROSCAR) 5 MG tablet Take 5 mg by mouth daily.     gabapentin (NEURONTIN) 100 MG capsule Take 100 mg by mouth 3 (three) times daily.     latanoprost (XALATAN) 0.005 % ophthalmic solution Apply to eye.     levothyroxine (SYNTHROID) 100 MCG tablet Take 1 tablet by mouth daily.     mirtazapine (REMERON) 15 MG tablet Take 3 tablets by mouth daily.     Omeprazole 20 MG TBEC Take 1 tablet by mouth daily.     oxybutynin (DITROPAN) 5 MG tablet Take 5 mg by mouth 3 (three) times daily.     pilocarpine (SALAGEN) 5 MG tablet Take 1 tablet by mouth in the morning, at noon, and at bedtime.     sertraline (ZOLOFT) 100 MG tablet Take 1 tablet by mouth daily.     solifenacin (VESICARE) 10 MG tablet Take 1 tablet by mouth daily.     sucralfate (CARAFATE) 1 g tablet Take by mouth.     tamsulosin (FLOMAX) 0.4 MG CAPS capsule Take 0.4 mg by mouth daily.     traZODone (DESYREL) 100 MG tablet Take 100 mg by mouth at bedtime.        aspirin EC  81 mg Oral Daily   atorvastatin   40 mg Oral Daily   darifenacin  7.5 mg Oral Daily   diazepam  5 mg Oral TID   dicyclomine  20 mg Oral TID AC & HS   DULoxetine  60 mg Oral Daily   finasteride  5 mg Oral Daily   furosemide  20 mg Intravenous Daily   gabapentin  100 mg Oral TID   levothyroxine  100 mcg Oral Q0600   metoprolol tartrate  25 mg Oral BID   mirtazapine  45 mg Oral Daily   oxybutynin  5 mg Oral TID   pantoprazole (PROTONIX) IV  40 mg Intravenous Q24H   sertraline  100 mg Oral Daily   sucralfate  1 g Oral TID WC & HS   tamsulosin  0.4 mg Oral Daily   traZODone  100 mg Oral QHS    Infusions:  heparin 1,150 Units/hr (10/21/20 0129)    No Known Allergies  Social History   Socioeconomic History   Marital status: Married    Spouse name: Not on file   Number of children: Not on file   Years of education: Not on file   Highest  education level: Not on file  Occupational History   Not on file  Tobacco Use   Smoking status: Never   Smokeless tobacco: Never  Vaping Use   Vaping Use: Never used  Substance and Sexual Activity   Alcohol use: No    Alcohol/week: 0.0 standard drinks   Drug use: Not on file   Sexual activity: Not on file  Other Topics Concern   Not on file  Social History Narrative   Not on file   Social Determinants of Health   Financial Resource Strain: Not on file  Food Insecurity: Not on file  Transportation Needs: Not on file  Physical Activity: Not on file  Stress: Not on file  Social Connections: Not on file  Intimate Partner Violence: Not on file    Family History  Problem Relation Age of Onset   Osteoporosis Mother     PHYSICAL EXAM: Vitals:   10/21/20 0358 10/21/20 0934  BP: 124/80 104/60  Pulse: 100 84  Resp: 16 17  Temp: 98 F (36.7 C) 98.7 F (37.1 C)  SpO2: 100% 98%     Intake/Output Summary (Last 24 hours) at 10/21/2020 1018 Last data filed at 10/21/2020 0811 Gross per 24 hour  Intake --  Output 700 ml  Net -700 ml    General:  Well  appearing. No respiratory difficulty HEENT: normal Neck: supple. no JVD. Carotids 2+ bilat; no bruits. No lymphadenopathy or thryomegaly appreciated. Cor: PMI nondisplaced. Regular rate & rhythm. No rubs, gallops or murmurs. Lungs: clear Abdomen: soft, nontender, nondistended. No hepatosplenomegaly. No bruits or masses. Good bowel sounds. Extremities: no cyanosis, clubbing, rash, edema Neuro: alert & oriented x 3, cranial nerves grossly intact. moves all 4 extremities w/o difficulty. Affect pleasant.  ECG: Atrial fibrillation with rapid ventricular response  Results for orders placed or performed during the hospital encounter of 10/20/20 (from the past 24 hour(s))  Comprehensive metabolic panel     Status: Abnormal   Collection Time: 10/20/20  7:49 PM  Result Value Ref Range   Sodium 133 (L) 135 - 145 mmol/L   Potassium 3.7 3.5 - 5.1 mmol/L   Chloride 106 98 - 111 mmol/L   CO2 22 22 - 32 mmol/L   Glucose, Bld 116 (H) 70 - 99 mg/dL   BUN 16 8 - 23 mg/dL   Creatinine, Ser 1.08 0.61 - 1.24 mg/dL   Calcium 8.1 (L) 8.9 - 10.3 mg/dL   Total Protein 5.8 (L) 6.5 - 8.1 g/dL   Albumin 2.7 (L) 3.5 - 5.0 g/dL   AST 24 15 - 41 U/L   ALT 13 0 - 44 U/L   Alkaline Phosphatase 81 38 - 126 U/L   Total Bilirubin 1.6 (H) 0.3 - 1.2 mg/dL   GFR, Estimated >60 >60 mL/min   Anion gap 5 5 - 15  Troponin I (High Sensitivity)     Status: Abnormal   Collection Time: 10/20/20  7:49 PM  Result Value Ref Range   Troponin I (High Sensitivity) 150 (HH) <18 ng/L  CBC with Differential     Status: Abnormal   Collection Time: 10/20/20  7:49 PM  Result Value Ref Range   WBC 2.3 (L) 4.0 - 10.5 K/uL   RBC 2.27 (L) 4.22 - 5.81 MIL/uL   Hemoglobin 8.4 (L) 13.0 - 17.0 g/dL   HCT 25.4 (L) 39.0 - 52.0 %   MCV 111.9 (H) 80.0 - 100.0 fL   MCH 37.0 (H) 26.0 - 34.0 pg   MCHC  33.1 30.0 - 36.0 g/dL   RDW 18.1 (H) 11.5 - 15.5 %   Platelets 170 150 - 400 K/uL   nRBC 2.2 (H) 0.0 - 0.2 %   Neutrophils Relative % 65 %    Neutro Abs 1.4 (L) 1.7 - 7.7 K/uL   Lymphocytes Relative 19 %   Lymphs Abs 0.4 (L) 0.7 - 4.0 K/uL   Monocytes Relative 5 %   Monocytes Absolute 0.1 0.1 - 1.0 K/uL   Eosinophils Relative 0 %   Eosinophils Absolute 0.0 0.0 - 0.5 K/uL   Basophils Relative 3 %   Basophils Absolute 0.1 0.0 - 0.1 K/uL   WBC Morphology MILD LEFT SHIFT (1-5% METAS, OCC MYELO, OCC BANDS)    Smear Review Normal platelet morphology    Immature Granulocytes 8 %   Abs Immature Granulocytes 0.19 (H) 0.00 - 0.07 K/uL   Burr Cells PRESENT    Polychromasia PRESENT   Lipase, blood     Status: None   Collection Time: 10/20/20  7:49 PM  Result Value Ref Range   Lipase 28 11 - 51 U/L  TSH     Status: Abnormal   Collection Time: 10/20/20  7:49 PM  Result Value Ref Range   TSH 4.734 (H) 0.350 - 4.500 uIU/mL  Resp Panel by RT-PCR (Flu A&B, Covid) Nasopharyngeal Swab     Status: None   Collection Time: 10/20/20  8:11 PM   Specimen: Nasopharyngeal Swab; Nasopharyngeal(NP) swabs in vial transport medium  Result Value Ref Range   SARS Coronavirus 2 by RT PCR NEGATIVE NEGATIVE   Influenza A by PCR NEGATIVE NEGATIVE   Influenza B by PCR NEGATIVE NEGATIVE  Troponin I (High Sensitivity)     Status: Abnormal   Collection Time: 10/20/20 10:09 PM  Result Value Ref Range   Troponin I (High Sensitivity) 135 (HH) <18 ng/L  APTT     Status: Abnormal   Collection Time: 10/21/20  2:19 AM  Result Value Ref Range   aPTT 63 (H) 24 - 36 seconds  Protime-INR     Status: Abnormal   Collection Time: 10/21/20  2:19 AM  Result Value Ref Range   Prothrombin Time 20.8 (H) 11.4 - 15.2 seconds   INR 1.8 (H) 0.8 - 1.2  Heparin level     Status: Abnormal   Collection Time: 10/21/20  2:19 AM  Result Value Ref Range   Heparin Unfractionated >1.10 (H) 0.30 - 0.70 IU/mL  Troponin I (High Sensitivity)     Status: Abnormal   Collection Time: 10/21/20  2:19 AM  Result Value Ref Range   Troponin I (High Sensitivity) 116 (HH) <18 ng/L  Troponin I  (High Sensitivity)     Status: Abnormal   Collection Time: 10/21/20  6:13 AM  Result Value Ref Range   Troponin I (High Sensitivity) 103 (HH) <18 ng/L  APTT     Status: Abnormal   Collection Time: 10/21/20  9:15 AM  Result Value Ref Range   aPTT 92 (H) 24 - 36 seconds   DG Chest 2 View  Result Date: 10/20/2020 CLINICAL DATA:  Chest pain EXAM: CHEST - 2 VIEW COMPARISON:  None. FINDINGS: Small bilateral pleural effusions. No focal consolidation. Normal cardiomediastinal silhouette. No pneumothorax. Clips over the right chest. Linear atelectasis left base IMPRESSION: Small bilateral pleural effusions. Electronically Signed   By: Donavan Foil M.D.   On: 10/20/2020 20:35   ECHOCARDIOGRAM COMPLETE  Result Date: 10/21/2020    ECHOCARDIOGRAM REPORT   Patient Name:  Gordon Farrell Date of Exam: 10/21/2020 Medical Rec #:  [id number]     Height:       68.0 in Accession #:    [id number]    Weight:       182.5 lb Date of Birth:  04-Sep-1945     BSA:          1.966 m Patient Age:    64 years      BP:           124/80 mmHg Patient Gender: M             HR:           109 bpm. Exam Location:  [facility] Procedure: 2D Echo Indications:     Chest Pain R07.9  History:         Patient has no prior history of Echocardiogram examinations.  Sonographer:     Kathlen Brunswick RDCS Referring Phys:  [place] Diagnosing Phys: Neoma Laming MD IMPRESSIONS  1. Left ventricular ejection fraction, by estimation, is 55 to 60%. The left ventricle has normal function. The left ventricle has no regional wall motion abnormalities. Left ventricular diastolic parameters were normal.  2. Right ventricular systolic function is normal. The right ventricular size is normal.  3. The mitral valve is normal in structure. Trivial mitral valve regurgitation. No evidence of mitral stenosis.  4. The aortic valve is normal in structure. Aortic valve regurgitation is not visualized. Mild aortic valve sclerosis is present, with no evidence of aortic  valve stenosis.  5. The inferior vena cava is normal in size with greater than 50% respiratory variability, suggesting right atrial pressure of 3 mmHg. FINDINGS  Left Ventricle: Left ventricular ejection fraction, by estimation, is 55 to 60%. The left ventricle has normal function. The left ventricle has no regional wall motion abnormalities. The left ventricular internal cavity size was normal in size. There is  borderline left ventricular hypertrophy. Left ventricular diastolic parameters were normal. Right Ventricle: The right ventricular size is normal. No increase in right ventricular wall thickness. Right ventricular systolic function is normal. Left Atrium: Left atrial size was normal in size. Right Atrium: Right atrial size was normal in size. Pericardium: There is no evidence of pericardial effusion. Mitral Valve: The mitral valve is normal in structure. Trivial mitral valve regurgitation. No evidence of mitral valve stenosis. Tricuspid Valve: The tricuspid valve is normal in structure. Tricuspid valve regurgitation is trivial. No evidence of tricuspid stenosis. Aortic Valve: The aortic valve is normal in structure. Aortic valve regurgitation is not visualized. Mild aortic valve sclerosis is present, with no evidence of aortic valve stenosis. Aortic valve peak gradient measures 4.5 mmHg. Pulmonic Valve: The pulmonic valve was normal in structure. Pulmonic valve regurgitation is not visualized. No evidence of pulmonic stenosis. Aorta: The aortic root is normal in size and structure. Venous: The inferior vena cava is normal in size with greater than 50% respiratory variability, suggesting right atrial pressure of 3 mmHg. IAS/Shunts: No atrial level shunt detected by color flow Doppler.  LEFT VENTRICLE PLAX 2D LVIDd:         5.00 cm  Diastology LVIDs:         3.50 cm  LV e' medial:    8.70 cm/s LV PW:         1.30 cm  LV E/e' medial:  9.5 LV IVS:        1.20 cm  LV e' lateral:   10.70 cm/s LVOT diam:  2.00  cm  LV E/e' lateral: 7.7 LV SV:         57 LV SV Index:   29 LVOT Area:     3.14 cm  RIGHT VENTRICLE RV Basal diam:  3.70 cm RV S prime:     11.90 cm/s TAPSE (M-mode): 1.8 cm LEFT ATRIUM             Index       RIGHT ATRIUM           Index LA diam:        3.10 cm 1.58 cm/m  RA Area:     15.20 cm LA Vol (A2C):   37.6 ml 19.13 ml/m RA Volume:   40.10 ml  20.40 ml/m LA Vol (A4C):   49.1 ml 24.98 ml/m LA Biplane Vol: 44.8 ml 22.79 ml/m  AORTIC VALVE AV Area (Vmax): 2.86 cm AV Vmax:        106.00 cm/s AV Peak Grad:   4.5 mmHg LVOT Vmax:      96.60 cm/s LVOT Vmean:     70.800 cm/s LVOT VTI:       0.183 m  AORTA Ao Root diam: 3.50 cm Ao Asc diam:  3.30 cm MITRAL VALVE MV Area (PHT): 3.58 cm    SHUNTS MV Decel Time: 212 msec    Systemic VTI:  0.18 m MV E velocity: 82.30 cm/s  Systemic Diam: 2.00 cm MV A velocity: 57.40 cm/s MV E/A ratio:  1.43 Neoma Laming MD Electronically signed by Neoma Laming MD Signature Date/Time: 10/21/2020/10:13:09 AM    Final    CT Angio Chest/Abd/Pel for Dissection W and/or Wo Contrast  Result Date: 10/20/2020 CLINICAL DATA:  Chest pain today. EXAM: CT ANGIOGRAPHY CHEST, ABDOMEN AND PELVIS TECHNIQUE: Non-contrast CT of the chest was initially obtained. Multidetector CT imaging through the chest, abdomen and pelvis was performed using the standard protocol during bolus administration of intravenous contrast. Multiplanar reconstructed images and MIPs were obtained and reviewed to evaluate the vascular anatomy. CONTRAST:  119mL OMNIPAQUE IOHEXOL 350 MG/ML SOLN COMPARISON:  Radiograph earlier today. FINDINGS: CTA CHEST FINDINGS Cardiovascular: No aortic hematoma noncontrast exam. Thoracic aorta is normal in caliber. No aneurysm. No dissection, evidence of acute aortic syndrome or vasculitis. The pulmonary arteries are well opacified to the subsegmental level. There is no pulmonary embolus. Heart is normal in size. Trace pericardial effusion. Mild coronary artery calcifications.  Mediastinum/Nodes: Calcified left hilar lymph nodes consistent with prior granulomatous disease. No noncalcified adenopathy. Scattered small mediastinal lymph nodes are not enlarged by size criteria. Mild esophageal wall thickening with questionable paraesophageal edema and intraluminal debris in the mid esophagus. No pneumomediastinum. No thyroid nodule. Lungs/Pleura: Small bilateral pleural effusions with adjacent compressive atelectasis. Left lower lobe calcification may be a calcified granuloma or pleural calcification. Mild central bronchial thickening. No pneumothorax. No pulmonary mass. Musculoskeletal: Mild anterior wedging of T1, T3, and T12 vertebral bodies, no adjacent soft tissue thickening or inflammation to suggest acuity. No focal bone lesion. Mild multilevel thoracic spondylosis with endplate spurring. Surgical clips posterior to the right scapula. Review of the MIP images confirms the above findings. CTA ABDOMEN AND PELVIS FINDINGS VASCULAR Aorta: Normal caliber aorta without aneurysm, dissection, vasculitis or significant stenosis. Mild aortic atherosclerosis. Celiac: Patent without evidence of aneurysm, dissection, vasculitis or significant stenosis. Separate origins of the right left hepatic artery from the celiac axis. SMA: Patent without evidence of aneurysm, dissection, vasculitis or significant stenosis. Renals: Both renal arteries are patent without evidence of aneurysm,  dissection, vasculitis, fibromuscular dysplasia or significant stenosis. IMA: Patent without evidence of aneurysm, dissection, vasculitis or significant stenosis. Inflow: Patent without evidence of aneurysm, dissection, vasculitis or significant stenosis. Veins: No obvious venous abnormality within the limitations of this arterial phase study. Review of the MIP images confirms the above findings. NON-VASCULAR Hepatobiliary: No evidence of focal liver abnormality on this arterial exam. Post cholecystectomy. No biliary  dilatation. Pancreas: No ductal dilatation or inflammation. Spleen: Mildly enlarged spanning 13.8 cm cranial caudal. No focal abnormality on arterial phase imaging. Adrenals/Urinary Tract: No adrenal nodule. Right ureteral stent in place with pigtail in the right renal pelvis and urinary bladder. 4 mm stone in the right distal ureter adjacent to the stent, series 5, image 175. Likely tiny stone fragment slightly more proximally, series 5, image 170. Mild stranding about the distal aspect of the ureter and stent. There is mild right hydronephrosis and hydroureter despite stent placement. Nonobstructing 8 mm stone in the upper right kidney. Punctate nonobstructing stone in the mid right kidney. 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 There are multiple nonobstructing intrarenal left renal calculi. Mild thinning of the left renal parenchyma. Small cyst arises from the lower left kidney. There is no left hydronephrosis. No left ureteral stones. Urinary bladder is partially distended, no stones in the bladder. No bladder wall thickening. Stomach/Bowel: Stomach is decompressed. Normal positioning of the duodenum and ligament of Treitz. No small bowel obstruction or inflammation. Normal appendix tentatively visualized. Fluid/liquid stool in the right colon. Air-filled transverse, descending, and proximal sigmoid colon. There is no colonic wall thickening or inflammation. No bowel obstruction. Lymphatic: No abdominopelvic adenopathy. Reproductive: Prostate gland not seen, atrophic or surgically absent. Other: Postsurgical change of the anterior abdominal wall. No ascites. No free air. No focal fluid collection. Bilateral fat containing inguinal hernias. Musculoskeletal: Right hip arthroplasty. Moderate degenerative change of the left hip. Degenerative change throughout the lumbar spine without acute lumbar abnormality. Review of the MIP images confirms the above findings. IMPRESSION: 1. No aortic  dissection or acute aortic abnormality. Mild aortic atherosclerosis. No pulmonary embolus. 2. Small bilateral pleural effusions with adjacent compressive atelectasis. Mild central bronchial thickening. 3. Mild esophageal wall thickening with question of paraesophageal stranding and luminal debris in the mid esophagus. Query esophagitis. Endoscopy may be of value if not recently performed for more detailed esophageal assessment. 4. Right ureteral stent in place with 4 mm stone in the right distal ureter adjacent to the stent. Likely tiny stone fragment slightly more proximally. Mild right hydronephrosis and hydroureter despite stent placement. 5. Additional nonobstructing stones in both kidneys. 6. Mild splenomegaly. 7. Mild anterior wedging of T1, T3, and T12 vertebral bodies, no adjacent soft tissue thickening or inflammation to suggest acuity. 8. Fluid/liquid stool in the right colon, can be seen with diarrheal illness. No colonic wall thickening or inflammation. Aortic Atherosclerosis (ICD10-I70.0). Electronically Signed   By: Keith Rake M.D.   On: 10/20/2020 23:10     ASSESSMENT AND PLAN: Chest pain with mildly elevated troponin with peak troponin of 135 and ejection fraction on echocardiogram of 55% with history of atrial fibrillation on Eliquis.  Eliquis will be withheld and start the patient on heparin and will set up the patient for cardiac catheterization Monday morning.  Patient has agreed to the procedure.  It scheduled at 7:30 in the morning on Monday.  Shalece Staffa A

## 2020-10-21 NOTE — Final Consult Note (Signed)
Gordon Darby, MD [address]  [place]  [place], [place] [address]  Main: [phone]  Fax: [phone] Pager: [phone]   Consultation  Referring Provider:     No ref. provider found Primary Care Physician:  Danae Orleans, MD Primary Gastroenterologist: [facility] gastroenterology and hepatology         Reason for Consultation:     Esophageal thickening on the CT scan  Date of Admission:  10/20/2020 Date of Consultation:  10/21/2020         HPI:   Gordon Farrell is a 75 y.o. male with history of Crohn's disease in remission, history of DVT on Eliquis, presented to ER from urgent care for evaluation of chest pain.  He underwent work-up of chest pain, found to have elevated troponins, CT angio chest PE protocol did not reveal PE or there was any evidence of aortic dissection.  There was presence of paraesophageal hernia and mild esophageal wall thickening.  Patient said that he broke crown of his tooth about a week ago, that has resulted in soreness on that side of his tongue associated with difficulty eating.  Patient already had an upper endoscopy at [facility] in 06/2019 which was unremarkable including biopsies of the esophagus, small duodenal ulcer, presence of small hiatal hernia and there were few small fundic gland polyps.  Patient is evaluated by cardiology, plan is to undergo cardiac catheterization on Monday by Dr. Humphrey Rolls.  Patient is kept n.p.o. and started on heparin drip.  He is on Protonix 40 mg daily.  Patient denies any PPI intake as outpatient.  He denies any heartburn, regurgitation.  He denies any difficulty swallowing. He does have known history of severe macrocytic anemia, closely followed by [facility] hematology.  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 Biopsied.                         - Small hiatal hernia.                         - A few fundic gland gastric polyps.                         - Normal mucosa was found in the stomach. Biopsied.                         - Erythematous duodenopathy and one small cleaned base                         ulceration. Biopsied.   - The examined portion of the ileum was normal.                         - Pseudopolyps at the appendiceal orifice, transverse                         colon and sigmoid colon.                         -  Normal mucosa in the entire examined colon. Biopsied.                         - Anal papilla(e) were hypertrophied.   Past Medical History:  Diagnosis Date   Anemia    Hypothyroidism     Past Surgical History:  Procedure Laterality Date   BACK SURGERY     CHOLECYSTECTOMY     COLON RESECTION     multiple times   TOTAL HIP ARTHROPLASTY  05/06/2010    Prior to Admission medications   Medication Sig Start Date End Date Taking? Authorizing Provider  atorvastatin (LIPITOR) 40 MG tablet Take 1 tablet by mouth daily. 08/15/20   [provider]  diazepam (VALIUM) 5 MG tablet Take 1 tablet by mouth 3 (three) times daily.    [provider]  dicyclomine (BENTYL) 20 MG tablet Take by mouth.    [provider]  DULoxetine (CYMBALTA) 20 MG capsule Take 3 capsules by mouth daily. Patient not taking: Reported on 10/21/2020    [provider]  ELIQUIS 5 MG TABS tablet Take 1 tablet by mouth 2 (two) times daily. 08/24/20   [provider]  finasteride (PROSCAR) 5 MG tablet Take 5 mg by mouth daily. 10/04/20   [provider]  gabapentin (NEURONTIN) 100 MG capsule Take 100 mg by mouth 3 (three) times daily. 09/28/20   [provider]  latanoprost (XALATAN) 0.005 % ophthalmic solution Apply to eye.    [provider]  levothyroxine (SYNTHROID) 100 MCG tablet Take 1 tablet by mouth daily.  01/11/20 01/10/21  [provider]  mirtazapine (REMERON) 15 MG tablet Take 3 tablets by mouth daily. Patient not taking: Reported on 10/21/2020    [provider]  Omeprazole 20 MG TBEC Take 1 tablet by mouth daily.    [provider]  oxybutynin (DITROPAN) 5 MG tablet Take 5 mg by mouth 3 (three) times daily. 10/03/20   [provider]  pilocarpine (SALAGEN) 5 MG tablet Take 1 tablet by mouth in the morning, at noon, and at bedtime. 08/11/20 08/11/21  [provider]  sertraline (ZOLOFT) 100 MG tablet Take 1 tablet by mouth daily. 05/23/20 05/23/21  [provider]  solifenacin (VESICARE) 10 MG tablet Take 1 tablet by mouth daily. Patient not taking: Reported on 10/21/2020    [provider]  sucralfate (CARAFATE) 1 g tablet Take by mouth.    [provider]  tamsulosin (FLOMAX) 0.4 MG CAPS capsule Take 0.4 mg by mouth daily. 08/22/20   [provider]  traZODone (DESYREL) 100 MG tablet Take 100 mg by mouth at bedtime. 08/25/20   [provider]    Current Facility-Administered Medications:    acetaminophen (TYLENOL) tablet 650 mg, 650 mg, Oral, Q4H PRN, Agbata, Tochukwu, MD, 650 mg at 10/21/20 1259   aspirin EC tablet 81 mg, 81 mg, Oral, Daily, Agbata, Tochukwu, MD, 81 mg at 10/21/20 1047   atorvastatin (LIPITOR) tablet 40 mg, 40 mg, Oral, Daily, Agbata, Tochukwu, MD, 40 mg at 10/21/20 1048   diazepam (VALIUM) tablet 5 mg, 5 mg, Oral, TID, Agbata, Tochukwu, MD, 5 mg at 10/21/20 0150   dicyclomine (BENTYL) tablet 20 mg, 20 mg, Oral, TID AC & HS, Agbata, Tochukwu, MD, 20 mg at 10/21/20 1216   finasteride (PROSCAR) tablet 5 mg, 5 mg, Oral, Daily, Agbata, Tochukwu, MD, 5 mg at 10/21/20 1050   furosemide (LASIX) injection 20 mg, 20 mg, Intravenous, Daily,  Collier Bullock, MD, 20 mg at 10/21/20 1037   gabapentin (NEURONTIN) capsule 100 mg, 100 mg, Oral, TID, Agbata, Tochukwu, MD, 100 mg at 10/21/20 1051   heparin ADULT  infusion 100 units/mL (25000 units/251m), 1,150 Units/hr, Intravenous, Continuous, BRenda Rolls RPH, Last Rate: 11.5 mL/hr at 10/21/20 0129, 1,150 Units/hr at 10/21/20 0129   levothyroxine (SYNTHROID) tablet 100 mcg, 100 mcg, Oral, Q0600, Agbata, Tochukwu, MD   metoprolol tartrate (LOPRESSOR) tablet 25 mg, 25 mg, Oral, BID, Agbata, Tochukwu, MD, 25 mg at 10/21/20 1051   nitroGLYCERIN (NITROSTAT) SL tablet 0.4 mg, 0.4 mg, Sublingual, Q5 min PRN, SCarrie Mew MD   ondansetron ([place] injection 4 mg, 4 mg, Intravenous, Q6H PRN, Agbata, Tochukwu, MD   oxybutynin (DITROPAN) tablet 5 mg, 5 mg, Oral, TID, Agbata, Tochukwu, MD, 5 mg at 10/21/20 1052   pantoprazole (PROTONIX) injection 40 mg, 40 mg, Intravenous, Q12H, Malyk Girouard, RTally Due MD   pilocarpine (SALAGEN) tablet 5 mg, 5 mg, Oral, TID, ZSharen Hones MD   sertraline (ZOLOFT) tablet 100 mg, 100 mg, Oral, Daily, Agbata, Tochukwu, MD, 100 mg at 10/21/20 1052   sodium chloride flush (NS) 0.9 % injection 3 mL, 3 mL, Intravenous, Q12H, KNeoma LamingA, MD, 3 mL at 10/21/20 1037   sucralfate (CARAFATE) tablet 1 g, 1 g, Oral, TID WC & HS, Agbata, Tochukwu, MD, 1 g at 10/21/20 1219   tamsulosin (FLOMAX) capsule 0.4 mg, 0.4 mg, Oral, Daily, Agbata, Tochukwu, MD   traZODone (DESYREL) tablet 100 mg, 100 mg, Oral, QHS, Agbata, Tochukwu, MD, 100 mg at 10/21/20 0149  Family History  Problem Relation Age of Onset   Osteoporosis Mother      Social History   Tobacco Use   Smoking status: Never   Smokeless tobacco: Never  Vaping Use   Vaping Use: Never used  Substance Use Topics   Alcohol use: No    Alcohol/week: 0.0 standard drinks    Allergies as of 10/20/2020   (No Known Allergies)    Review of Systems:    All systems reviewed and negative except where noted in HPI.   Physical Exam:  Vital signs in last 24 hours: Temp:  [98 F (36.7 C)-99 F (37.2 C)] 98.1 F (36.7 C) (06/18 1409) Pulse Rate:  [48-262] 71 (06/18 1409) Resp:   [15-27] 21 (06/18 1409) BP: (96-143)/(48-98) 103/48 (06/18 1409) SpO2:  [84 %-100 %] 99 % (06/18 1409) Weight:  [78.9 kg-82.8 kg] 82.8 kg (06/18 0020) Last BM Date: 10/21/20 General:   Pleasant, cooperative in NAD Head:  Normocephalic and atraumatic. Eyes:   No icterus.   Conjunctiva pink. PERRLA. Ears:  Normal auditory acuity. Neck:  Supple; no masses or thyroidomegaly Lungs: Respirations even and unlabored. Lungs clear to auscultation bilaterally.   No wheezes, crackles, or rhonchi.  Heart:  Regular rate and rhythm;  Without murmur, clicks, rubs or gallops Abdomen:  Soft, nondistended, nontender. Normal bowel sounds. No appreciable masses or hepatomegaly.  No rebound or guarding.  Rectal:  Not performed. Msk:  Symmetrical without gross deformities.  Strength generalized weakness Extremities:  Without edema, cyanosis or clubbing. Neurologic:  Alert and oriented x3;  grossly normal neurologically. Skin:  Intact without significant lesions or rashes. Psych:  Alert and cooperative. Normal affect.  LAB RESULTS: CBC Latest Ref Rng & Units 10/20/2020 09/05/2014  WBC 4.0 - 10.5 K/uL 2.3(L) -  Hemoglobin 13.0 - 17.0 g/dL 8.4(L) 14.4  Hematocrit 39.0 - 52.0 % 25.4(L) -  Platelets 150 - 400 K/uL 170 -  BMET BMP Latest Ref Rng & Units 10/20/2020 09/05/2014  Glucose 70 - 99 mg/dL 116(H) -  BUN 8 - 23 mg/dL 16 9  Creatinine 0.61 - 1.24 mg/dL 1.08 1.0  Sodium 135 - 145 mmol/L 133(L) -  Potassium 3.5 - 5.1 mmol/L 3.7 -  Chloride 98 - 111 mmol/L 106 -  CO2 22 - 32 mmol/L 22 -  Calcium 8.9 - 10.3 mg/dL 8.1(L) -    LFT Hepatic Function Latest Ref Rng & Units 10/20/2020  Total Protein 6.5 - 8.1 g/dL 5.8(L)  Albumin 3.5 - 5.0 g/dL 2.7(L)  AST 15 - 41 U/L 24  ALT 0 - 44 U/L 13  Alk Phosphatase 38 - 126 U/L 81  Total Bilirubin 0.3 - 1.2 mg/dL 1.6(H)     STUDIES: DG Chest 2 View  Result Date: 10/20/2020 CLINICAL DATA:  Chest pain EXAM: CHEST - 2 VIEW COMPARISON:  None. FINDINGS: Small  bilateral pleural effusions. No focal consolidation. Normal cardiomediastinal silhouette. No pneumothorax. Clips over the right chest. Linear atelectasis left base IMPRESSION: Small bilateral pleural effusions. Electronically Signed   By: Donavan Foil M.D.   On: 10/20/2020 20:35   ECHOCARDIOGRAM COMPLETE  Result Date: 10/21/2020    ECHOCARDIOGRAM REPORT   Patient Name:   Gordon Farrell Date of Exam: 10/21/2020 Medical Rec #:  [id number]     Height:       68.0 in Accession #:    [id number]    Weight:       182.5 lb Date of Birth:  [birth year]     BSA:          1.966 m Patient Age:    22 years      BP:           124/80 mmHg Patient Gender: M             HR:           109 bpm. Exam Location:  [facility] Procedure: 2D Echo Indications:     Chest Pain R07.9  History:         Patient has no prior history of Echocardiogram examinations.  Sonographer:     Kathlen Brunswick RDCS Referring Phys:  [place] Diagnosing Phys: Neoma Laming MD IMPRESSIONS  1. Left ventricular ejection fraction, by estimation, is 55 to 60%. The left ventricle has normal function. The left ventricle has no regional wall motion abnormalities. Left ventricular diastolic parameters were normal.  2. Right ventricular systolic function is normal. The right ventricular size is normal.  3. The mitral valve is normal in structure. Trivial mitral valve regurgitation. No evidence of mitral stenosis.  4. The aortic valve is normal in structure. Aortic valve regurgitation is not visualized. Mild aortic valve sclerosis is present, with no evidence of aortic valve stenosis.  5. The inferior vena cava is normal in size with greater than 50% respiratory variability, suggesting right atrial pressure of 3 mmHg. FINDINGS  Left Ventricle: Left ventricular ejection fraction, by estimation, is 55 to 60%. The left ventricle has normal function. The left ventricle has no regional wall motion abnormalities. The left ventricular internal cavity size was normal in  size. There is  borderline left ventricular hypertrophy. Left ventricular diastolic parameters were normal. Right Ventricle: The right ventricular size is normal. No increase in right ventricular wall thickness. Right ventricular systolic function is normal. Left Atrium: Left atrial size was normal in size. Right Atrium: Right atrial size was normal in size. Pericardium: There is no evidence  of pericardial effusion. Mitral Valve: The mitral valve is normal in structure. Trivial mitral valve regurgitation. No evidence of mitral valve stenosis. Tricuspid Valve: The tricuspid valve is normal in structure. Tricuspid valve regurgitation is trivial. No evidence of tricuspid stenosis. Aortic Valve: The aortic valve is normal in structure. Aortic valve regurgitation is not visualized. Mild aortic valve sclerosis is present, with no evidence of aortic valve stenosis. Aortic valve peak gradient measures 4.5 mmHg. Pulmonic Valve: The pulmonic valve was normal in structure. Pulmonic valve regurgitation is not visualized. No evidence of pulmonic stenosis. Aorta: The aortic root is normal in size and structure. Venous: The inferior vena cava is normal in size with greater than 50% respiratory variability, suggesting right atrial pressure of 3 mmHg. IAS/Shunts: No atrial level shunt detected by color flow Doppler.  LEFT VENTRICLE PLAX 2D LVIDd:         5.00 cm  Diastology LVIDs:         3.50 cm  LV e' medial:    8.70 cm/s LV PW:         1.30 cm  LV E/e' medial:  9.5 LV IVS:        1.20 cm  LV e' lateral:   10.70 cm/s LVOT diam:     2.00 cm  LV E/e' lateral: 7.7 LV SV:         57 LV SV Index:   29 LVOT Area:     3.14 cm  RIGHT VENTRICLE RV Basal diam:  3.70 cm RV S prime:     11.90 cm/s TAPSE (M-mode): 1.8 cm LEFT ATRIUM             Index       RIGHT ATRIUM           Index LA diam:        3.10 cm 1.58 cm/m  RA Area:     15.20 cm LA Vol (A2C):   37.6 ml 19.13 ml/m RA Volume:   40.10 ml  20.40 ml/m LA Vol (A4C):   49.1 ml 24.98  ml/m LA Biplane Vol: 44.8 ml 22.79 ml/m  AORTIC VALVE AV Area (Vmax): 2.86 cm AV Vmax:        106.00 cm/s AV Peak Grad:   4.5 mmHg LVOT Vmax:      96.60 cm/s LVOT Vmean:     70.800 cm/s LVOT VTI:       0.183 m  AORTA Ao Root diam: 3.50 cm Ao Asc diam:  3.30 cm MITRAL VALVE MV Area (PHT): 3.58 cm    SHUNTS MV Decel Time: 212 msec    Systemic VTI:  0.18 m MV E velocity: 82.30 cm/s  Systemic Diam: 2.00 cm MV A velocity: 57.40 cm/s MV E/A ratio:  1.43 Neoma Laming MD Electronically signed by Neoma Laming MD Signature Date/Time: 10/21/2020/10:13:09 AM    Final    CT Angio Chest/Abd/Pel for Dissection W and/or Wo Contrast  Result Date: 10/20/2020 CLINICAL DATA:  Chest pain today. EXAM: CT ANGIOGRAPHY CHEST, ABDOMEN AND PELVIS TECHNIQUE: Non-contrast CT of the chest was initially obtained. Multidetector CT imaging through the chest, abdomen and pelvis was performed using the standard protocol during bolus administration of intravenous contrast. Multiplanar reconstructed images and MIPs were obtained and reviewed to evaluate the vascular anatomy. CONTRAST:  150m OMNIPAQUE IOHEXOL 350 MG/ML SOLN COMPARISON:  Radiograph earlier today. FINDINGS: CTA CHEST FINDINGS Cardiovascular: No aortic hematoma noncontrast exam. Thoracic aorta is normal in caliber. No aneurysm. No dissection, evidence of acute  aortic syndrome or vasculitis. The pulmonary arteries are well opacified to the subsegmental level. There is no pulmonary embolus. Heart is normal in size. Trace pericardial effusion. Mild coronary artery calcifications. Mediastinum/Nodes: Calcified left hilar lymph nodes consistent with prior granulomatous disease. No noncalcified adenopathy. Scattered small mediastinal lymph nodes are not enlarged by size criteria. Mild esophageal wall thickening with questionable paraesophageal edema and intraluminal debris in the mid esophagus. No pneumomediastinum. No thyroid nodule. Lungs/Pleura: Small bilateral pleural effusions with  adjacent compressive atelectasis. Left lower lobe calcification may be a calcified granuloma or pleural calcification. Mild central bronchial thickening. No pneumothorax. No pulmonary mass. Musculoskeletal: Mild anterior wedging of T1, T3, and T12 vertebral bodies, no adjacent soft tissue thickening or inflammation to suggest acuity. No focal bone lesion. Mild multilevel thoracic spondylosis with endplate spurring. Surgical clips posterior to the right scapula. Review of the MIP images confirms the above findings. CTA ABDOMEN AND PELVIS FINDINGS VASCULAR Aorta: Normal caliber aorta without aneurysm, dissection, vasculitis or significant stenosis. Mild aortic atherosclerosis. Celiac: Patent without evidence of aneurysm, dissection, vasculitis or significant stenosis. Separate origins of the right left hepatic artery from the celiac axis. SMA: Patent without evidence of aneurysm, dissection, vasculitis or significant stenosis. Renals: Both renal arteries are patent without evidence of aneurysm, dissection, vasculitis, fibromuscular dysplasia or significant stenosis. IMA: Patent without evidence of aneurysm, dissection, vasculitis or significant stenosis. Inflow: Patent without evidence of aneurysm, dissection, vasculitis or significant stenosis. Veins: No obvious venous abnormality within the limitations of this arterial phase study. Review of the MIP images confirms the above findings. NON-VASCULAR Hepatobiliary: No evidence of focal liver abnormality on this arterial exam. Post cholecystectomy. No biliary dilatation. Pancreas: No ductal dilatation or inflammation. Spleen: Mildly enlarged spanning 13.8 cm cranial caudal. No focal abnormality on arterial phase imaging. Adrenals/Urinary Tract: No adrenal nodule. Right ureteral stent in place with pigtail in the right renal pelvis and urinary bladder. 4 mm stone in the right distal ureter adjacent to the stent, series 5, image 175. Likely tiny stone fragment slightly  more proximally, series 5, image 170. Mild stranding about the distal aspect of the ureter and stent. There is mild right hydronephrosis and hydroureter despite stent placement. Nonobstructing 8 mm stone in the upper right kidney. Punctate nonobstructing stone in the mid right kidney. 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 There are multiple nonobstructing intrarenal left renal calculi. Mild thinning of the left renal parenchyma. Small cyst arises from the lower left kidney. There is no left hydronephrosis. No left ureteral stones. Urinary bladder is partially distended, no stones in the bladder. No bladder wall thickening. Stomach/Bowel: Stomach is decompressed. Normal positioning of the duodenum and ligament of Treitz. No small bowel obstruction or inflammation. Normal appendix tentatively visualized. Fluid/liquid stool in the right colon. Air-filled transverse, descending, and proximal sigmoid colon. There is no colonic wall thickening or inflammation. No bowel obstruction. Lymphatic: No abdominopelvic adenopathy. Reproductive: Prostate gland not seen, atrophic or surgically absent. Other: Postsurgical change of the anterior abdominal wall. No ascites. No free air. No focal fluid collection. Bilateral fat containing inguinal hernias. Musculoskeletal: Right hip arthroplasty. Moderate degenerative change of the left hip. Degenerative change throughout the lumbar spine without acute lumbar abnormality. Review of the MIP images confirms the above findings. IMPRESSION: 1. No aortic dissection or acute aortic abnormality. Mild aortic atherosclerosis. No pulmonary embolus. 2. Small bilateral pleural effusions with adjacent compressive atelectasis. Mild central bronchial thickening. 3. Mild esophageal wall thickening with question of paraesophageal stranding and  luminal debris in the mid esophagus. Query esophagitis. Endoscopy may be of value if not recently performed for more detailed  esophageal assessment. 4. Right ureteral stent in place with 4 mm stone in the right distal ureter adjacent to the stent. Likely tiny stone fragment slightly more proximally. Mild right hydronephrosis and hydroureter despite stent placement. 5. Additional nonobstructing stones in both kidneys. 6. Mild splenomegaly. 7. Mild anterior wedging of T1, T3, and T12 vertebral bodies, no adjacent soft tissue thickening or inflammation to suggest acuity. 8. Fluid/liquid stool in the right colon, can be seen with diarrheal illness. No colonic wall thickening or inflammation. Aortic Atherosclerosis (ICD10-I70.0). Electronically Signed   By: Keith Rake M.D.   On: 10/20/2020 23:10      Impression / Plan:   Abdulwahab Demelo is a 75 y.o. male with history of DVT on Eliquis, Crohn's disease in remission who presented with chest pain, currently being worked up for NSTEMI.  CT chest incidentally revealed mild esophageal thickening.  I do not recommend endoscopy at this time, particularly in the setting of NSTEMI.  Patient already had EGD in 2/21 which was unremarkable.  There was no evidence of esophagitis or peptic stricture. Recommend that patient stays on Protonix 40 mg p.o. twice daily and follow-up with [facility] GI as outpatient if needed Resume diet  Thank you for involving me in the care of this patient.  Please call us back with questions or concerns    LOS: 0 days   Sherri Sear, MD  10/21/2020, 2:42 PM    Note: This dictation was prepared with Dragon dictation along with smaller phrase technology. Any transcriptional errors that result from this process are unintentional.

## 2020-10-21 NOTE — Progress Notes (Signed)
PROGRESS NOTE    Gordon Farrell  CBS:[id number] DOB: 1945-06-13 DOA: 10/20/2020 PCP: Danae Orleans, MD   Chief complaint.  Chest pain. Brief Narrative:  Gordon Farrell is a 75 y.o. male with medical history significant for Crohn's disease, DVT, hypothyroidism and BPH who presents to the emergency room from the urgent care for evaluation of chest pain.  He is a peak troponin 150, he has been seen by cardiology, scheduled for heart cath on Monday.  He is also found to have new onset atrial fibrillation.  He was previously on anticoagulation for DVT, switched to heparin.   Assessment & Plan:   Principal Problem:   Chest pain Active Problems:   Acquired hypothyroidism   Deep vein thrombosis (DVT) (HCC)   Unspecified atrial fibrillation (HCC)   Acute CHF (congestive heart failure) ([place])   Dysphagia  #1.  Chest pain with mild elevated troponin. New onset atrial fibrillation. Acute congestive heart failure.  Pending echocardiogram to evaluate LV systolic function Patient has been evaluated by cardiology, pending heart cath on Monday. Continue heparin drip. Telemetry to monitor for arrhythmia. Patient volume status is better, I will discontinue IV Lasix. Pending echocardiogram.  2.  Dysphagia. Patient has been seen by GI, no plan for EGD due to cardiac work-up.  Continue PPI, follow-up with outpatient GI.  3.  Pancytopenia. Patient has significant leukopenia and anemia.  Check iron, B12 level. Patient does not have any acute GI bleed.    DVT prophylaxis: Heparin Code Status: Full Family Communication:  Disposition Plan:    Status is: Observation  The patient will require care spanning > 2 midnights and should be moved to inpatient because: IV treatments appropriate due to intensity of illness or inability to take PO and Inpatient level of care appropriate due to severity of illness  Dispo: The patient is from: Home              Anticipated d/c is to: Home               Patient currently is not medically stable to d/c.   Difficult to place patient No        I/O last 3 completed shifts: In: -  Out: 400 [Urine:400] Total I/O In: 243 [P.O.:240; I.V.:3] Out: 300 [Urine:300]     Consultants:  Card  Procedures: none  Antimicrobials: None  Subjective: Patient doing well today, he has some chronic right shoulder pain, no short of breath.  Chest pain has resolved. No fever or chills. No dysuria hematuria No abdominal pain nausea vomiting.  Objective: Vitals:   10/21/20 0038 10/21/20 0358 10/21/20 0934 10/21/20 1409  BP: (!) 143/76 124/80 104/60 (!) 103/48  Pulse: 84 100 84 71  Resp: 17 16 17  (!) 21  Temp: 98.7 F (37.1 C) 98 F (36.7 C) 98.7 F (37.1 C) 98.1 F (36.7 C)  TempSrc:   Oral Oral  SpO2: 100% 100% 98% 99%  Weight:      Height:        Intake/Output Summary (Last 24 hours) at 10/21/2020 1438 Last data filed at 10/21/2020 1345 Gross per 24 hour  Intake 243 ml  Output 700 ml  Net -457 ml   Filed Weights   10/20/20 1945 10/21/20 0020  Weight: 78.9 kg 82.8 kg    Examination:  General exam: Appears calm and comfortable  Respiratory system: Clear to auscultation. Respiratory effort normal. Cardiovascular system: Irregular, no JVD, murmurs, rubs, gallops or clicks. No pedal edema. Gastrointestinal system: Abdomen is nondistended,  soft and nontender. No organomegaly or masses felt. Normal bowel sounds heard. Central nervous system: Alert and oriented. No focal neurological deficits. Extremities: Symmetric  Skin: No rashes, lesions or ulcers Psychiatry: Judgement and insight appear normal. Mood & affect appropriate.     Data Reviewed: I have personally reviewed following labs and imaging studies  CBC: Recent Labs  Lab 10/20/20 1949  WBC 2.3*  NEUTROABS 1.4*  HGB 8.4*  HCT 25.4*  MCV 111.9*  PLT 213   Basic Metabolic Panel: Recent Labs  Lab 10/20/20 1949  NA 133*  K 3.7  CL 106  CO2 22  GLUCOSE 116*   BUN 16  CREATININE 1.08  CALCIUM 8.1*   GFR: Estimated Creatinine Clearance: 62 mL/min (by C-G formula based on SCr of 1.08 mg/dL). Liver Function Tests: Recent Labs  Lab 10/20/20 1949  AST 24  ALT 13  ALKPHOS 81  BILITOT 1.6*  PROT 5.8*  ALBUMIN 2.7*   Recent Labs  Lab 10/20/20 1949  LIPASE 28   No results for input(s): AMMONIA in the last 168 hours. Coagulation Profile: Recent Labs  Lab 10/21/20 0219  INR 1.8*   Cardiac Enzymes: No results for input(s): CKTOTAL, CKMB, CKMBINDEX, TROPONINI in the last 168 hours. BNP (last 3 results) No results for input(s): PROBNP in the last 8760 hours. HbA1C: No results for input(s): HGBA1C in the last 72 hours. CBG: No results for input(s): GLUCAP in the last 168 hours. Lipid Profile: No results for input(s): CHOL, HDL, LDLCALC, TRIG, CHOLHDL, LDLDIRECT in the last 72 hours. Thyroid Function Tests: Recent Labs    10/20/20 1949  TSH 4.734*   Anemia Panel: No results for input(s): VITAMINB12, FOLATE, FERRITIN, TIBC, IRON, RETICCTPCT in the last 72 hours. Sepsis Labs: No results for input(s): PROCALCITON, LATICACIDVEN in the last 168 hours.  Recent Results (from the past 240 hour(s))  Resp Panel by RT-PCR (Flu A&B, Covid) Nasopharyngeal Swab     Status: None   Collection Time: 10/20/20  8:11 PM   Specimen: Nasopharyngeal Swab; Nasopharyngeal(NP) swabs in vial transport medium  Result Value Ref Range Status   SARS Coronavirus 2 by RT PCR NEGATIVE NEGATIVE Final    Comment: (NOTE) SARS-CoV-2 target nucleic acids are NOT DETECTED.  The SARS-CoV-2 RNA is generally detectable in upper respiratory specimens during the acute phase of infection. The lowest concentration of SARS-CoV-2 viral copies this assay can detect is 138 copies/mL. A negative result does not preclude SARS-Cov-2 infection and should not be used as the sole basis for treatment or other patient management decisions. A negative result may occur with   improper specimen collection/handling, submission of specimen other than nasopharyngeal swab, presence of viral mutation(s) within the areas targeted by this assay, and inadequate number of viral copies(<138 copies/mL). A negative result must be combined with clinical observations, patient history, and epidemiological information. The expected result is Negative.  Fact Sheet for Patients:  EntrepreneurPulse.com.au  Fact Sheet for Healthcare Providers:  IncredibleEmployment.be  This test is no t yet approved or cleared by the [place] FDA and  has been authorized for detection and/or diagnosis of SARS-CoV-2 by FDA under an Emergency Use Authorization (EUA). This EUA will remain  in effect (meaning this test can be used) for the duration of the COVID-19 declaration under Section 564(b)(1) of the Act, 21 U.S.C.section 360bbb-3(b)(1), unless the authorization is terminated  or revoked sooner.       Influenza A by PCR NEGATIVE NEGATIVE Final   Influenza B by PCR NEGATIVE  NEGATIVE Final    Comment: (NOTE) The Xpert Xpress SARS-CoV-2/FLU/RSV plus assay is intended as an aid in the diagnosis of influenza from Nasopharyngeal swab specimens and should not be used as a sole basis for treatment. Nasal washings and aspirates are unacceptable for Xpert Xpress SARS-CoV-2/FLU/RSV testing.  Fact Sheet for Patients: EntrepreneurPulse.com.au  Fact Sheet for Healthcare Providers: IncredibleEmployment.be  This test is not yet approved or cleared by the [place] FDA and has been authorized for detection and/or diagnosis of SARS-CoV-2 by FDA under an Emergency Use Authorization (EUA). This EUA will remain in effect (meaning this test can be used) for the duration of the COVID-19 declaration under Section 564(b)(1) of the Act, 21 U.S.C. section 360bbb-3(b)(1), unless the authorization is terminated  or revoked.  Performed at [facility], [address], [place], [place] [address]          Radiology Studies: DG Chest 2 View  Result Date: 10/20/2020 CLINICAL DATA:  Chest pain EXAM: CHEST - 2 VIEW COMPARISON:  None. FINDINGS: Small bilateral pleural effusions. No focal consolidation. Normal cardiomediastinal silhouette. No pneumothorax. Clips over the right chest. Linear atelectasis left base IMPRESSION: Small bilateral pleural effusions. Electronically Signed   By: Donavan Foil M.D.   On: 10/20/2020 20:35   ECHOCARDIOGRAM COMPLETE  Result Date: 10/21/2020    ECHOCARDIOGRAM REPORT   Patient Name:   Gordon Farrell Date of Exam: 10/21/2020 Medical Rec #:  [id number]     Height:       68.0 in Accession #:    [id number]    Weight:       182.5 lb Date of Birth:  16-Aug-1945     BSA:          1.966 m Patient Age:    27 years      BP:           124/80 mmHg Patient Gender: M             HR:           109 bpm. Exam Location:  [facility] Procedure: 2D Echo Indications:     Chest Pain R07.9  History:         Patient has no prior history of Echocardiogram examinations.  Sonographer:     Kathlen Brunswick RDCS Referring Phys:  [place] Diagnosing Phys: Neoma Laming MD IMPRESSIONS  1. Left ventricular ejection fraction, by estimation, is 55 to 60%. The left ventricle has normal function. The left ventricle has no regional wall motion abnormalities. Left ventricular diastolic parameters were normal.  2. Right ventricular systolic function is normal. The right ventricular size is normal.  3. The mitral valve is normal in structure. Trivial mitral valve regurgitation. No evidence of mitral stenosis.  4. The aortic valve is normal in structure. Aortic valve regurgitation is not visualized. Mild aortic valve sclerosis is present, with no evidence of aortic valve stenosis.  5. The inferior vena cava is normal in size with greater than 50% respiratory variability, suggesting right atrial pressure of 3  mmHg. FINDINGS  Left Ventricle: Left ventricular ejection fraction, by estimation, is 55 to 60%. The left ventricle has normal function. The left ventricle has no regional wall motion abnormalities. The left ventricular internal cavity size was normal in size. There is  borderline left ventricular hypertrophy. Left ventricular diastolic parameters were normal. Right Ventricle: The right ventricular size is normal. No increase in right ventricular wall thickness. Right ventricular systolic function is normal. Left Atrium: Left atrial  size was normal in size. Right Atrium: Right atrial size was normal in size. Pericardium: There is no evidence of pericardial effusion. Mitral Valve: The mitral valve is normal in structure. Trivial mitral valve regurgitation. No evidence of mitral valve stenosis. Tricuspid Valve: The tricuspid valve is normal in structure. Tricuspid valve regurgitation is trivial. No evidence of tricuspid stenosis. Aortic Valve: The aortic valve is normal in structure. Aortic valve regurgitation is not visualized. Mild aortic valve sclerosis is present, with no evidence of aortic valve stenosis. Aortic valve peak gradient measures 4.5 mmHg. Pulmonic Valve: The pulmonic valve was normal in structure. Pulmonic valve regurgitation is not visualized. No evidence of pulmonic stenosis. Aorta: The aortic root is normal in size and structure. Venous: The inferior vena cava is normal in size with greater than 50% respiratory variability, suggesting right atrial pressure of 3 mmHg. IAS/Shunts: No atrial level shunt detected by color flow Doppler.  LEFT VENTRICLE PLAX 2D LVIDd:         5.00 cm  Diastology LVIDs:         3.50 cm  LV e' medial:    8.70 cm/s LV PW:         1.30 cm  LV E/e' medial:  9.5 LV IVS:        1.20 cm  LV e' lateral:   10.70 cm/s LVOT diam:     2.00 cm  LV E/e' lateral: 7.7 LV SV:         57 LV SV Index:   29 LVOT Area:     3.14 cm  RIGHT VENTRICLE RV Basal diam:  3.70 cm RV S prime:      11.90 cm/s TAPSE (M-mode): 1.8 cm LEFT ATRIUM             Index       RIGHT ATRIUM           Index LA diam:        3.10 cm 1.58 cm/m  RA Area:     15.20 cm LA Vol (A2C):   37.6 ml 19.13 ml/m RA Volume:   40.10 ml  20.40 ml/m LA Vol (A4C):   49.1 ml 24.98 ml/m LA Biplane Vol: 44.8 ml 22.79 ml/m  AORTIC VALVE AV Area (Vmax): 2.86 cm AV Vmax:        106.00 cm/s AV Peak Grad:   4.5 mmHg LVOT Vmax:      96.60 cm/s LVOT Vmean:     70.800 cm/s LVOT VTI:       0.183 m  AORTA Ao Root diam: 3.50 cm Ao Asc diam:  3.30 cm MITRAL VALVE MV Area (PHT): 3.58 cm    SHUNTS MV Decel Time: 212 msec    Systemic VTI:  0.18 m MV E velocity: 82.30 cm/s  Systemic Diam: 2.00 cm MV A velocity: 57.40 cm/s MV E/A ratio:  1.43 Neoma Laming MD Electronically signed by Neoma Laming MD Signature Date/Time: 10/21/2020/10:13:09 AM    Final    CT Angio Chest/Abd/Pel for Dissection W and/or Wo Contrast  Result Date: 10/20/2020 CLINICAL DATA:  Chest pain today. EXAM: CT ANGIOGRAPHY CHEST, ABDOMEN AND PELVIS TECHNIQUE: Non-contrast CT of the chest was initially obtained. Multidetector CT imaging through the chest, abdomen and pelvis was performed using the standard protocol during bolus administration of intravenous contrast. Multiplanar reconstructed images and MIPs were obtained and reviewed to evaluate the vascular anatomy. CONTRAST:  123mL OMNIPAQUE IOHEXOL 350 MG/ML SOLN COMPARISON:  Radiograph earlier today. FINDINGS: CTA CHEST FINDINGS  Cardiovascular: No aortic hematoma noncontrast exam. Thoracic aorta is normal in caliber. No aneurysm. No dissection, evidence of acute aortic syndrome or vasculitis. The pulmonary arteries are well opacified to the subsegmental level. There is no pulmonary embolus. Heart is normal in size. Trace pericardial effusion. Mild coronary artery calcifications. Mediastinum/Nodes: Calcified left hilar lymph nodes consistent with prior granulomatous disease. No noncalcified adenopathy. Scattered small mediastinal  lymph nodes are not enlarged by size criteria. Mild esophageal wall thickening with questionable paraesophageal edema and intraluminal debris in the mid esophagus. No pneumomediastinum. No thyroid nodule. Lungs/Pleura: Small bilateral pleural effusions with adjacent compressive atelectasis. Left lower lobe calcification may be a calcified granuloma or pleural calcification. Mild central bronchial thickening. No pneumothorax. No pulmonary mass. Musculoskeletal: Mild anterior wedging of T1, T3, and T12 vertebral bodies, no adjacent soft tissue thickening or inflammation to suggest acuity. No focal bone lesion. Mild multilevel thoracic spondylosis with endplate spurring. Surgical clips posterior to the right scapula. Review of the MIP images confirms the above findings. CTA ABDOMEN AND PELVIS FINDINGS VASCULAR Aorta: Normal caliber aorta without aneurysm, dissection, vasculitis or significant stenosis. Mild aortic atherosclerosis. Celiac: Patent without evidence of aneurysm, dissection, vasculitis or significant stenosis. Separate origins of the right left hepatic artery from the celiac axis. SMA: Patent without evidence of aneurysm, dissection, vasculitis or significant stenosis. Renals: Both renal arteries are patent without evidence of aneurysm, dissection, vasculitis, fibromuscular dysplasia or significant stenosis. IMA: Patent without evidence of aneurysm, dissection, vasculitis or significant stenosis. Inflow: Patent without evidence of aneurysm, dissection, vasculitis or significant stenosis. Veins: No obvious venous abnormality within the limitations of this arterial phase study. Review of the MIP images confirms the above findings. NON-VASCULAR Hepatobiliary: No evidence of focal liver abnormality on this arterial exam. Post cholecystectomy. No biliary dilatation. Pancreas: No ductal dilatation or inflammation. Spleen: Mildly enlarged spanning 13.8 cm cranial caudal. No focal abnormality on arterial phase  imaging. Adrenals/Urinary Tract: No adrenal nodule. Right ureteral stent in place with pigtail in the right renal pelvis and urinary bladder. 4 mm stone in the right distal ureter adjacent to the stent, series 5, image 175. Likely tiny stone fragment slightly more proximally, series 5, image 170. Mild stranding about the distal aspect of the ureter and stent. There is mild right hydronephrosis and hydroureter despite stent placement. Nonobstructing 8 mm stone in the upper right kidney. Punctate nonobstructing stone in the mid right kidney. 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 There are multiple nonobstructing intrarenal left renal calculi. Mild thinning of the left renal parenchyma. Small cyst arises from the lower left kidney. There is no left hydronephrosis. No left ureteral stones. Urinary bladder is partially distended, no stones in the bladder. No bladder wall thickening. Stomach/Bowel: Stomach is decompressed. Normal positioning of the duodenum and ligament of Treitz. No small bowel obstruction or inflammation. Normal appendix tentatively visualized. Fluid/liquid stool in the right colon. Air-filled transverse, descending, and proximal sigmoid colon. There is no colonic wall thickening or inflammation. No bowel obstruction. Lymphatic: No abdominopelvic adenopathy. Reproductive: Prostate gland not seen, atrophic or surgically absent. Other: Postsurgical change of the anterior abdominal wall. No ascites. No free air. No focal fluid collection. Bilateral fat containing inguinal hernias. Musculoskeletal: Right hip arthroplasty. Moderate degenerative change of the left hip. Degenerative change throughout the lumbar spine without acute lumbar abnormality. Review of the MIP images confirms the above findings. IMPRESSION: 1. No aortic dissection or acute aortic abnormality. Mild aortic atherosclerosis. No pulmonary embolus. 2. Small bilateral pleural effusions  with adjacent compressive  atelectasis. Mild central bronchial thickening. 3. Mild esophageal wall thickening with question of paraesophageal stranding and luminal debris in the mid esophagus. Query esophagitis. Endoscopy may be of value if not recently performed for more detailed esophageal assessment. 4. Right ureteral stent in place with 4 mm stone in the right distal ureter adjacent to the stent. Likely tiny stone fragment slightly more proximally. Mild right hydronephrosis and hydroureter despite stent placement. 5. Additional nonobstructing stones in both kidneys. 6. Mild splenomegaly. 7. Mild anterior wedging of T1, T3, and T12 vertebral bodies, no adjacent soft tissue thickening or inflammation to suggest acuity. 8. Fluid/liquid stool in the right colon, can be seen with diarrheal illness. No colonic wall thickening or inflammation. Aortic Atherosclerosis (ICD10-I70.0). Electronically Signed   By: Keith Rake M.D.   On: 10/20/2020 23:10        Scheduled Meds:  aspirin EC  81 mg Oral Daily   atorvastatin  40 mg Oral Daily   diazepam  5 mg Oral TID   dicyclomine  20 mg Oral TID AC & HS   finasteride  5 mg Oral Daily   furosemide  20 mg Intravenous Daily   gabapentin  100 mg Oral TID   levothyroxine  100 mcg Oral Q0600   metoprolol tartrate  25 mg Oral BID   oxybutynin  5 mg Oral TID   pantoprazole (PROTONIX) IV  40 mg Intravenous Q12H   pilocarpine  5 mg Oral TID   sertraline  100 mg Oral Daily   sodium chloride flush  3 mL Intravenous Q12H   sucralfate  1 g Oral TID WC & HS   tamsulosin  0.4 mg Oral Daily   traZODone  100 mg Oral QHS   Continuous Infusions:  heparin 1,150 Units/hr (10/21/20 0129)     LOS: 0 days    Time spent: no charge    Sharen Hones, MD [facility]   To contact the attending provider between 7A-7P or the covering provider during after hours 7P-7A, please log into the web site [email or web address] and access using universal [place] password for that web site. If you do  not have the password, please call the hospital operator.  10/21/2020, 2:38 PM

## 2020-10-22 DIAGNOSIS — R079 Chest pain, unspecified: Secondary | ICD-10-CM | POA: Diagnosis not present

## 2020-10-22 DIAGNOSIS — I5031 Acute diastolic (congestive) heart failure: Secondary | ICD-10-CM | POA: Diagnosis not present

## 2020-10-22 DIAGNOSIS — I480 Paroxysmal atrial fibrillation: Secondary | ICD-10-CM

## 2020-10-22 LAB — CBC
HCT: 25.7 % — ABNORMAL LOW (ref 39.0–52.0)
Hemoglobin: 8.4 g/dL — ABNORMAL LOW (ref 13.0–17.0)
MCH: 37 pg — ABNORMAL HIGH (ref 26.0–34.0)
MCHC: 32.7 g/dL (ref 30.0–36.0)
MCV: 113.2 fL — ABNORMAL HIGH (ref 80.0–100.0)
Platelets: 184 10*3/uL (ref 150–400)
RBC: 2.27 MIL/uL — ABNORMAL LOW (ref 4.22–5.81)
RDW: 18.2 % — ABNORMAL HIGH (ref 11.5–15.5)
WBC: 1.3 10*3/uL — CL (ref 4.0–10.5)
nRBC: 2.2 % — ABNORMAL HIGH (ref 0.0–0.2)

## 2020-10-22 LAB — BASIC METABOLIC PANEL
Anion gap: 5 (ref 5–15)
BUN: 18 mg/dL (ref 8–23)
CO2: 24 mmol/L (ref 22–32)
Calcium: 8 mg/dL — ABNORMAL LOW (ref 8.9–10.3)
Chloride: 113 mmol/L — ABNORMAL HIGH (ref 98–111)
Creatinine, Ser: 1.05 mg/dL (ref 0.61–1.24)
GFR, Estimated: >60 mL/min (ref >60)
Glucose, Bld: 107 mg/dL — ABNORMAL HIGH (ref 70–99)
Potassium: 3.4 mmol/L — ABNORMAL LOW (ref 3.5–5.1)
Sodium: 142 mmol/L (ref 135–145)

## 2020-10-22 LAB — HEPARIN LEVEL (UNFRACTIONATED): Heparin Unfractionated: >1.1 IU/mL — ABNORMAL HIGH (ref 0.30–0.70)

## 2020-10-22 LAB — APTT
aPTT: 89 seconds — ABNORMAL HIGH (ref 24–36)
aPTT: 63 seconds — ABNORMAL HIGH (ref 24–36)
aPTT: 72 seconds — ABNORMAL HIGH (ref 24–36)

## 2020-10-22 LAB — HIV ANTIBODY (ROUTINE TESTING W REFLEX): HIV Screen 4th Generation wRfx: NONREACTIVE

## 2020-10-22 LAB — MAGNESIUM: Magnesium: 2 mg/dL (ref 1.7–2.4)

## 2020-10-22 MED ORDER — MAGIC MOUTHWASH W/LIDOCAINE
10.0000 mL | Freq: Three times a day (TID) | ORAL | Status: DC | PRN
  Administered 2020-10-22 (×2): 10 mL via ORAL
  Filled 2020-10-22 (×3): qty 10

## 2020-10-22 NOTE — Progress Notes (Signed)
ANTICOAGULATION CONSULT NOTE   Pharmacy Consult for heparin infusion Indication: ACS/nSTEMI  No Known Allergies  Patient Measurements: Height: 5\' 8"  (172.7 cm) Weight: 82.8 kg (182 lb 8 oz) IBW/kg (Calculated) : 68.4 Heparin Dosing Weight: 82.8 kg  Vital Signs: Temp: 98.1 F (36.7 C) (06/19 0529) Temp Source: Oral (06/18 1800) BP: 116/72 (06/19 0529) Pulse Rate: 74 (06/19 0529)  Labs: Recent Labs    10/20/20 1949 10/20/20 2209 10/21/20 0219 10/21/20 0219 10/21/20 0277 10/21/20 0915 10/21/20 1706 10/22/20 0140  HGB 8.4*  --   --   --   --   --   --  8.4*  HCT 25.4*  --   --   --   --   --   --  25.7*  PLT 170  --   --   --   --   --   --  184  APTT  --   --  63*   < >  --  92* 128* 89*  LABPROT  --   --  20.8*  --   --   --   --   --   INR  --   --  1.8*  --   --   --   --   --   HEPARINUNFRC  --   --  >1.10*  --   --   --   --  >1.10*  CREATININE 1.08  --   --   --   --   --   --  1.05  TROPONINIHS 150* 135* 116*  --  103*  --   --   --    < > = values in this interval not displayed.     Estimated Creatinine Clearance: 63.8 mL/min (by C-G formula based on SCr of 1.05 mg/dL).   Medical History: Past Medical History:  Diagnosis Date   Anemia    Hypothyroidism     Medications:  Pt on Eliquis 5 mg BID  Assessment: Pt is 75 yo male presenting to ER from urgent care for evaluation of chest pain.  6/18 @ 0915 aPTT= 92 therapeutic.  Cont @ 1150 untis/hr 6/18 @ 1706 aPTT= 128, supratherapeutic. Decrease to 1000 units/hr 6/19 @ 0140 aPTT = 89, therapeutic, HL >1.10  Goal of Therapy:  Heparin level 0.3-0.7 units/ml Monitor platelets by anticoagulation protocol: Yes Target aPTT 66-102   Plan:  Continue heparin infusion to rate of 1000 units/hr Due to PTA DOAC, will follow aPTT until correlation w/ HL Repeat aPTT in 8 hrs to confirm, then daily. Daily HL and CBC while on heparin.  Lonell Face, PharmD Clinical Pharmacist 10/22/2020

## 2020-10-22 NOTE — Progress Notes (Signed)
[place] for heparin infusion Indication: ACS/nSTEMI    (apixaban PTA)  No Known Allergies  Patient Measurements: Height: 5\' 8"  (172.7 cm) Weight: 82.8 kg (182 lb 8 oz) IBW/kg (Calculated) : 68.4 Heparin Dosing Weight: 82.8 kg  Vital Signs: Temp: 99 F (37.2 C) (06/19 0754) Temp Source: Oral (06/19 0754) BP: 112/71 (06/19 0754) Pulse Rate: 88 (06/19 0754)  Labs: Recent Labs    10/20/20 1949 10/20/20 2209 10/21/20 0219 10/21/20 4081 10/21/20 0915 10/21/20 1706 10/22/20 0140 10/22/20 1014  HGB 8.4*  --   --   --   --   --  8.4*  --   HCT 25.4*  --   --   --   --   --  25.7*  --   PLT 170  --   --   --   --   --  184  --   APTT  --   --  63*  --    < > 128* 89* 63*  LABPROT  --   --  20.8*  --   --   --   --   --   INR  --   --  1.8*  --   --   --   --   --   HEPARINUNFRC  --   --  >1.10*  --   --   --  >1.10*  --   CREATININE 1.08  --   --   --   --   --  1.05  --   TROPONINIHS 150* 135* 116* 103*  --   --   --   --    < > = values in this interval not displayed.     Estimated Creatinine Clearance: 63.8 mL/min (by C-G formula based on SCr of 1.05 mg/dL).   Medical History: Past Medical History:  Diagnosis Date   Anemia    Hypothyroidism     Medications:  Pt on Eliquis 5 mg BID  Assessment: Pt is 75 yo male presenting to ER from urgent care for evaluation of chest pain.  6/18 @ 0915 aPTT= 92 therapeutic.  Cont @ 1150 untis/hr 6/18 @ 1706 aPTT= 128, supratherapeutic. Decrease to 1000 units/hr 6/19 @ 0140 aPTT = 89, therapeutic, HL >1.10 not correlating 6/19 @ 1014 aPTT= 63  subtherapeutic, Increase heparin infusion to rate of 1100 units/hr    Goal of Therapy:  Heparin level 0.3-0.7 units/ml Monitor platelets by anticoagulation protocol: Yes Target aPTT 66-102   Plan:  6/19 @ 1014 aPTT= 63  subtherapeutic, Increase heparin infusion to rate of 1100 units/hr Due to PTA DOAC, will follow aPTT until correlation  w/ HL Repeat aPTT in 8 hrs to confirm, then daily. Daily HL and CBC while on heparin.  Noralee Space, PharmD Clinical Pharmacist 10/22/2020

## 2020-10-22 NOTE — Progress Notes (Addendum)
PROGRESS NOTE    Gordon Farrell  DGL:[id number] DOB: Jun 22, 1945 DOA: 10/20/2020 PCP: Danae Orleans, MD   Chief complaint.  Chest pain. Brief Narrative:  Gordon Farrell is a 75 y.o. male with medical history significant for Crohn's disease, DVT, hypothyroidism and BPH who presents to the emergency room from the urgent care for evaluation of chest pain.  He is a peak troponin 150, he has been seen by cardiology, scheduled for heart cath on Monday.  He is also found to have new onset atrial fibrillation.  He was previously on anticoagulation for DVT, switched to heparin.   Assessment & Plan:   Principal Problem:   Chest pain Active Problems:   Acquired hypothyroidism   Deep vein thrombosis (DVT) (HCC)   Unspecified atrial fibrillation (HCC)   Acute CHF (congestive heart failure) ([place])   Dysphagia  #1.  Chest pain with mild elevated troponin. New onset atrial fibrillation. Acute on chronic diastolic congestive heart failure.  Reviewed echocardiogram, ejection fraction 55 to 60%, no longer has any volume overload. Continue heparin drip, pending decision about heart cath tomorrow.  2.  Dysphagia. Follow-up with GI as outpatient.  3.  Pancytopenia. Iron B12 level normal peer reviewed previous labs from outside hospital, patient has a chronic anemia and leukopenia.  4.  Hypokalemia. Repleted.   DVT prophylaxis: Heparin Code Status: Full Family Communication: wife updated.  Disposition Plan:    Status is: Inpatient  Remains inpatient appropriate because:Inpatient level of care appropriate due to severity of illness  Dispo: The patient is from: Home              Anticipated d/c is to: Home              Patient currently is not medically stable to d/c.   Difficult to place patient No        I/O last 3 completed shifts: In: 642.7 [P.O.:360; I.V.:282.7] Out: 1175 [Urine:1175] Total I/O In: 240 [P.O.:240] Out: -      Consultants:  Card  Procedures:  None  Antimicrobials: None Subjective: Patient doing well today.  He has some difficulty with swallowing due to his prior tongue injury.  He has some loose stools chronically.  No abdominal pain or nausea vomiting.  He does not have any chest pain, no palpitation.  No short of breath or cough. No fever or chills.  Objective: Vitals:   10/21/20 2119 10/22/20 0529 10/22/20 0754 10/22/20 1136  BP: 113/74 116/72 112/71 (!) 103/55  Pulse: 75 74 88 (!) 112  Resp: 16 16 16 17   Temp: 98 F (36.7 C) 98.1 F (36.7 C) 99 F (37.2 C) 98.5 F (36.9 C)  TempSrc:   Oral Oral  SpO2: 96% 99% 97% 99%  Weight:      Height:        Intake/Output Summary (Last 24 hours) at 10/22/2020 1154 Last data filed at 10/22/2020 0930 Gross per 24 hour  Intake 879.71 ml  Output 475 ml  Net 404.71 ml   Filed Weights   10/20/20 1945 10/21/20 0020  Weight: 78.9 kg 82.8 kg    Examination:  General exam: Appears calm and comfortable  Respiratory system: Clear to auscultation. Respiratory effort normal. Cardiovascular system: Regular, No JVD, murmurs, rubs, gallops or clicks. No pedal edema. Gastrointestinal system: Abdomen is nondistended, soft and nontender. No organomegaly or masses felt. Normal bowel sounds heard. Central nervous system: Alert and oriented. No focal neurological deficits. Extremities: Symmetric 5 x 5 power. Skin: No rashes, lesions or  ulcers Psychiatry: Judgement and insight appear normal. Mood & affect appropriate.     Data Reviewed: I have personally reviewed following labs and imaging studies  CBC: Recent Labs  Lab 10/20/20 1949 10/22/20 0140  WBC 2.3* 1.3*  NEUTROABS 1.4*  --   HGB 8.4* 8.4*  HCT 25.4* 25.7*  MCV 111.9* 113.2*  PLT 170 308   Basic Metabolic Panel: Recent Labs  Lab 10/20/20 1949 10/22/20 0140  NA 133* 142  K 3.7 3.4*  CL 106 113*  CO2 22 24  GLUCOSE 116* 107*  BUN 16 18  CREATININE 1.08 1.05  CALCIUM 8.1* 8.0*  MG  --  2.0   GFR: Estimated  Creatinine Clearance: 63.8 mL/min (by C-G formula based on SCr of 1.05 mg/dL). Liver Function Tests: Recent Labs  Lab 10/20/20 1949  AST 24  ALT 13  ALKPHOS 81  BILITOT 1.6*  PROT 5.8*  ALBUMIN 2.7*   Recent Labs  Lab 10/20/20 1949  LIPASE 28   No results for input(s): AMMONIA in the last 168 hours. Coagulation Profile: Recent Labs  Lab 10/21/20 0219  INR 1.8*   Cardiac Enzymes: No results for input(s): CKTOTAL, CKMB, CKMBINDEX, TROPONINI in the last 168 hours. BNP (last 3 results) No results for input(s): PROBNP in the last 8760 hours. HbA1C: No results for input(s): HGBA1C in the last 72 hours. CBG: No results for input(s): GLUCAP in the last 168 hours. Lipid Profile: No results for input(s): CHOL, HDL, LDLCALC, TRIG, CHOLHDL, LDLDIRECT in the last 72 hours. Thyroid Function Tests: Recent Labs    10/20/20 1949  TSH 4.734*   Anemia Panel: Recent Labs    10/21/20 1706  VITAMINB12 1,280*  TIBC 197*  IRON 41*   Sepsis Labs: No results for input(s): PROCALCITON, LATICACIDVEN in the last 168 hours.  Recent Results (from the past 240 hour(s))  Resp Panel by RT-PCR (Flu A&B, Covid) Nasopharyngeal Swab     Status: None   Collection Time: 10/20/20  8:11 PM   Specimen: Nasopharyngeal Swab; Nasopharyngeal(NP) swabs in vial transport medium  Result Value Ref Range Status   SARS Coronavirus 2 by RT PCR NEGATIVE NEGATIVE Final    Comment: (NOTE) SARS-CoV-2 target nucleic acids are NOT DETECTED.  The SARS-CoV-2 RNA is generally detectable in upper respiratory specimens during the acute phase of infection. The lowest concentration of SARS-CoV-2 viral copies this assay can detect is 138 copies/mL. A negative result does not preclude SARS-Cov-2 infection and should not be used as the sole basis for treatment or other patient management decisions. A negative result may occur with  improper specimen collection/handling, submission of specimen other than nasopharyngeal  swab, presence of viral mutation(s) within the areas targeted by this assay, and inadequate number of viral copies(<138 copies/mL). A negative result must be combined with clinical observations, patient history, and epidemiological information. The expected result is Negative.  Fact Sheet for Patients:  EntrepreneurPulse.com.au  Fact Sheet for Healthcare Providers:  IncredibleEmployment.be  This test is no t yet approved or cleared by the [place] FDA and  has been authorized for detection and/or diagnosis of SARS-CoV-2 by FDA under an Emergency Use Authorization (EUA). This EUA will remain  in effect (meaning this test can be used) for the duration of the COVID-19 declaration under Section 564(b)(1) of the Act, 21 U.S.C.section 360bbb-3(b)(1), unless the authorization is terminated  or revoked sooner.       Influenza A by PCR NEGATIVE NEGATIVE Final   Influenza B by PCR NEGATIVE NEGATIVE  Final    Comment: (NOTE) The Xpert Xpress SARS-CoV-2/FLU/RSV plus assay is intended as an aid in the diagnosis of influenza from Nasopharyngeal swab specimens and should not be used as a sole basis for treatment. Nasal washings and aspirates are unacceptable for Xpert Xpress SARS-CoV-2/FLU/RSV testing.  Fact Sheet for Patients: EntrepreneurPulse.com.au  Fact Sheet for Healthcare Providers: IncredibleEmployment.be  This test is not yet approved or cleared by the [place] FDA and has been authorized for detection and/or diagnosis of SARS-CoV-2 by FDA under an Emergency Use Authorization (EUA). This EUA will remain in effect (meaning this test can be used) for the duration of the COVID-19 declaration under Section 564(b)(1) of the Act, 21 U.S.C. section 360bbb-3(b)(1), unless the authorization is terminated or revoked.  Performed at [facility], [address], [place], [place] [address]           Radiology Studies: DG Chest 2 View  Result Date: 10/20/2020 CLINICAL DATA:  Chest pain EXAM: CHEST - 2 VIEW COMPARISON:  None. FINDINGS: Small bilateral pleural effusions. No focal consolidation. Normal cardiomediastinal silhouette. No pneumothorax. Clips over the right chest. Linear atelectasis left base IMPRESSION: Small bilateral pleural effusions. Electronically Signed   By: Donavan Foil M.D.   On: 10/20/2020 20:35   ECHOCARDIOGRAM COMPLETE  Result Date: 10/21/2020    ECHOCARDIOGRAM REPORT   Patient Name:   MACCOY HAUBNER Date of Exam: 10/21/2020 Medical Rec #:  [id number]     Height:       68.0 in Accession #:    [id number]    Weight:       182.5 lb Date of Birth:  1945-05-11     BSA:          1.966 m Patient Age:    17 years      BP:           124/80 mmHg Patient Gender: M             HR:           109 bpm. Exam Location:  [facility] Procedure: 2D Echo Indications:     Chest Pain R07.9  History:         Patient has no prior history of Echocardiogram examinations.  Sonographer:     Kathlen Brunswick RDCS Referring Phys:  [place] Diagnosing Phys: Neoma Laming MD IMPRESSIONS  1. Left ventricular ejection fraction, by estimation, is 55 to 60%. The left ventricle has normal function. The left ventricle has no regional wall motion abnormalities. Left ventricular diastolic parameters were normal.  2. Right ventricular systolic function is normal. The right ventricular size is normal.  3. The mitral valve is normal in structure. Trivial mitral valve regurgitation. No evidence of mitral stenosis.  4. The aortic valve is normal in structure. Aortic valve regurgitation is not visualized. Mild aortic valve sclerosis is present, with no evidence of aortic valve stenosis.  5. The inferior vena cava is normal in size with greater than 50% respiratory variability, suggesting right atrial pressure of 3 mmHg. FINDINGS  Left Ventricle: Left ventricular ejection fraction, by estimation, is 55 to 60%. The  left ventricle has normal function. The left ventricle has no regional wall motion abnormalities. The left ventricular internal cavity size was normal in size. There is  borderline left ventricular hypertrophy. Left ventricular diastolic parameters were normal. Right Ventricle: The right ventricular size is normal. No increase in right ventricular wall thickness. Right ventricular systolic function is normal. Left Atrium: Left atrial size  was normal in size. Right Atrium: Right atrial size was normal in size. Pericardium: There is no evidence of pericardial effusion. Mitral Valve: The mitral valve is normal in structure. Trivial mitral valve regurgitation. No evidence of mitral valve stenosis. Tricuspid Valve: The tricuspid valve is normal in structure. Tricuspid valve regurgitation is trivial. No evidence of tricuspid stenosis. Aortic Valve: The aortic valve is normal in structure. Aortic valve regurgitation is not visualized. Mild aortic valve sclerosis is present, with no evidence of aortic valve stenosis. Aortic valve peak gradient measures 4.5 mmHg. Pulmonic Valve: The pulmonic valve was normal in structure. Pulmonic valve regurgitation is not visualized. No evidence of pulmonic stenosis. Aorta: The aortic root is normal in size and structure. Venous: The inferior vena cava is normal in size with greater than 50% respiratory variability, suggesting right atrial pressure of 3 mmHg. IAS/Shunts: No atrial level shunt detected by color flow Doppler.  LEFT VENTRICLE PLAX 2D LVIDd:         5.00 cm  Diastology LVIDs:         3.50 cm  LV e' medial:    8.70 cm/s LV PW:         1.30 cm  LV E/e' medial:  9.5 LV IVS:        1.20 cm  LV e' lateral:   10.70 cm/s LVOT diam:     2.00 cm  LV E/e' lateral: 7.7 LV SV:         57 LV SV Index:   29 LVOT Area:     3.14 cm  RIGHT VENTRICLE RV Basal diam:  3.70 cm RV S prime:     11.90 cm/s TAPSE (M-mode): 1.8 cm LEFT ATRIUM             Index       RIGHT ATRIUM           Index LA  diam:        3.10 cm 1.58 cm/m  RA Area:     15.20 cm LA Vol (A2C):   37.6 ml 19.13 ml/m RA Volume:   40.10 ml  20.40 ml/m LA Vol (A4C):   49.1 ml 24.98 ml/m LA Biplane Vol: 44.8 ml 22.79 ml/m  AORTIC VALVE AV Area (Vmax): 2.86 cm AV Vmax:        106.00 cm/s AV Peak Grad:   4.5 mmHg LVOT Vmax:      96.60 cm/s LVOT Vmean:     70.800 cm/s LVOT VTI:       0.183 m  AORTA Ao Root diam: 3.50 cm Ao Asc diam:  3.30 cm MITRAL VALVE MV Area (PHT): 3.58 cm    SHUNTS MV Decel Time: 212 msec    Systemic VTI:  0.18 m MV E velocity: 82.30 cm/s  Systemic Diam: 2.00 cm MV A velocity: 57.40 cm/s MV E/A ratio:  1.43 Neoma Laming MD Electronically signed by Neoma Laming MD Signature Date/Time: 10/21/2020/10:13:09 AM    Final    CT Angio Chest/Abd/Pel for Dissection W and/or Wo Contrast  Result Date: 10/20/2020 CLINICAL DATA:  Chest pain today. EXAM: CT ANGIOGRAPHY CHEST, ABDOMEN AND PELVIS TECHNIQUE: Non-contrast CT of the chest was initially obtained. Multidetector CT imaging through the chest, abdomen and pelvis was performed using the standard protocol during bolus administration of intravenous contrast. Multiplanar reconstructed images and MIPs were obtained and reviewed to evaluate the vascular anatomy. CONTRAST:  170mL OMNIPAQUE IOHEXOL 350 MG/ML SOLN COMPARISON:  Radiograph earlier today. FINDINGS: CTA CHEST FINDINGS Cardiovascular:  No aortic hematoma noncontrast exam. Thoracic aorta is normal in caliber. No aneurysm. No dissection, evidence of acute aortic syndrome or vasculitis. The pulmonary arteries are well opacified to the subsegmental level. There is no pulmonary embolus. Heart is normal in size. Trace pericardial effusion. Mild coronary artery calcifications. Mediastinum/Nodes: Calcified left hilar lymph nodes consistent with prior granulomatous disease. No noncalcified adenopathy. Scattered small mediastinal lymph nodes are not enlarged by size criteria. Mild esophageal wall thickening with questionable  paraesophageal edema and intraluminal debris in the mid esophagus. No pneumomediastinum. No thyroid nodule. Lungs/Pleura: Small bilateral pleural effusions with adjacent compressive atelectasis. Left lower lobe calcification may be a calcified granuloma or pleural calcification. Mild central bronchial thickening. No pneumothorax. No pulmonary mass. Musculoskeletal: Mild anterior wedging of T1, T3, and T12 vertebral bodies, no adjacent soft tissue thickening or inflammation to suggest acuity. No focal bone lesion. Mild multilevel thoracic spondylosis with endplate spurring. Surgical clips posterior to the right scapula. Review of the MIP images confirms the above findings. CTA ABDOMEN AND PELVIS FINDINGS VASCULAR Aorta: Normal caliber aorta without aneurysm, dissection, vasculitis or significant stenosis. Mild aortic atherosclerosis. Celiac: Patent without evidence of aneurysm, dissection, vasculitis or significant stenosis. Separate origins of the right left hepatic artery from the celiac axis. SMA: Patent without evidence of aneurysm, dissection, vasculitis or significant stenosis. Renals: Both renal arteries are patent without evidence of aneurysm, dissection, vasculitis, fibromuscular dysplasia or significant stenosis. IMA: Patent without evidence of aneurysm, dissection, vasculitis or significant stenosis. Inflow: Patent without evidence of aneurysm, dissection, vasculitis or significant stenosis. Veins: No obvious venous abnormality within the limitations of this arterial phase study. Review of the MIP images confirms the above findings. NON-VASCULAR Hepatobiliary: No evidence of focal liver abnormality on this arterial exam. Post cholecystectomy. No biliary dilatation. Pancreas: No ductal dilatation or inflammation. Spleen: Mildly enlarged spanning 13.8 cm cranial caudal. No focal abnormality on arterial phase imaging. Adrenals/Urinary Tract: No adrenal nodule. Right ureteral stent in place with pigtail in the  right renal pelvis and urinary bladder. 4 mm stone in the right distal ureter adjacent to the stent, series 5, image 175. Likely tiny stone fragment slightly more proximally, series 5, image 170. Mild stranding about the distal aspect of the ureter and stent. There is mild right hydronephrosis and hydroureter despite stent placement. Nonobstructing 8 mm stone in the upper right kidney. Punctate nonobstructing stone in the mid right kidney. 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 There are multiple nonobstructing intrarenal left renal calculi. Mild thinning of the left renal parenchyma. Small cyst arises from the lower left kidney. There is no left hydronephrosis. No left ureteral stones. Urinary bladder is partially distended, no stones in the bladder. No bladder wall thickening. Stomach/Bowel: Stomach is decompressed. Normal positioning of the duodenum and ligament of Treitz. No small bowel obstruction or inflammation. Normal appendix tentatively visualized. Fluid/liquid stool in the right colon. Air-filled transverse, descending, and proximal sigmoid colon. There is no colonic wall thickening or inflammation. No bowel obstruction. Lymphatic: No abdominopelvic adenopathy. Reproductive: Prostate gland not seen, atrophic or surgically absent. Other: Postsurgical change of the anterior abdominal wall. No ascites. No free air. No focal fluid collection. Bilateral fat containing inguinal hernias. Musculoskeletal: Right hip arthroplasty. Moderate degenerative change of the left hip. Degenerative change throughout the lumbar spine without acute lumbar abnormality. Review of the MIP images confirms the above findings. IMPRESSION: 1. No aortic dissection or acute aortic abnormality. Mild aortic atherosclerosis. No pulmonary embolus. 2. Small bilateral pleural effusions with  adjacent compressive atelectasis. Mild central bronchial thickening. 3. Mild esophageal wall thickening with question of  paraesophageal stranding and luminal debris in the mid esophagus. Query esophagitis. Endoscopy may be of value if not recently performed for more detailed esophageal assessment. 4. Right ureteral stent in place with 4 mm stone in the right distal ureter adjacent to the stent. Likely tiny stone fragment slightly more proximally. Mild right hydronephrosis and hydroureter despite stent placement. 5. Additional nonobstructing stones in both kidneys. 6. Mild splenomegaly. 7. Mild anterior wedging of T1, T3, and T12 vertebral bodies, no adjacent soft tissue thickening or inflammation to suggest acuity. 8. Fluid/liquid stool in the right colon, can be seen with diarrheal illness. No colonic wall thickening or inflammation. Aortic Atherosclerosis (ICD10-I70.0). Electronically Signed   By: Keith Rake M.D.   On: 10/20/2020 23:10        Scheduled Meds:  aspirin EC  81 mg Oral Daily   atorvastatin  40 mg Oral Daily   diazepam  5 mg Oral TID   dicyclomine  20 mg Oral TID AC & HS   finasteride  5 mg Oral Daily   gabapentin  100 mg Oral TID   levothyroxine  100 mcg Oral Q0600   metoprolol tartrate  25 mg Oral BID   oxybutynin  5 mg Oral TID   pantoprazole (PROTONIX) IV  40 mg Intravenous Q12H   pilocarpine  5 mg Oral TID   sertraline  100 mg Oral Daily   sodium chloride flush  3 mL Intravenous Q12H   sucralfate  1 g Oral TID WC & HS   tamsulosin  0.4 mg Oral Daily   traZODone  100 mg Oral QHS   Continuous Infusions:  heparin 1,100 Units/hr (10/22/20 1134)     LOS: 1 day    Time spent: 28 minutes    Sharen Hones, MD [facility]   To contact the attending provider between 7A-7P or the covering provider during after hours 7P-7A, please log into the web site [email or web address] and access using universal [place] password for that web site. If you do not have the password, please call the hospital operator.  10/22/2020, 11:54 AM

## 2020-10-22 NOTE — Progress Notes (Signed)
[place] for heparin infusion Indication: ACS/nSTEMI    (apixaban PTA)  No Known Allergies  Patient Measurements: Height: 5\' 8"  (172.7 cm) Weight: 82.8 kg (182 lb 8 oz) IBW/kg (Calculated) : 68.4 Heparin Dosing Weight: 82.8 kg  Vital Signs: Temp: 99.1 F (37.3 C) (06/19 1954) Temp Source: Oral (06/19 1954) BP: 107/58 (06/19 1954) Pulse Rate: 88 (06/19 1954)  Labs: Recent Labs    10/20/20 1949 10/20/20 2209 10/21/20 0219 10/21/20 9163 10/21/20 0915 10/22/20 0140 10/22/20 1014 10/22/20 1945  HGB 8.4*  --   --   --   --  8.4*  --   --   HCT 25.4*  --   --   --   --  25.7*  --   --   PLT 170  --   --   --   --  184  --   --   APTT  --   --  63*  --    < > 89* 63* 72*  LABPROT  --   --  20.8*  --   --   --   --   --   INR  --   --  1.8*  --   --   --   --   --   HEPARINUNFRC  --   --  >1.10*  --   --  >1.10*  --   --   CREATININE 1.08  --   --   --   --  1.05  --   --   TROPONINIHS 150* 135* 116* 103*  --   --   --   --    < > = values in this interval not displayed.     Estimated Creatinine Clearance: 63.8 mL/min (by C-G formula based on SCr of 1.05 mg/dL).   Medical History: Past Medical History:  Diagnosis Date   Anemia    Hypothyroidism     Medications:  Pt on Eliquis 5 mg BID  Assessment: Pt is 75 yo male presenting to ER from urgent care for evaluation of chest pain.  6/18 @ 0915 aPTT= 92 therapeutic.  Cont @ 1150 untis/hr 6/18 @ 1706 aPTT= 128, supratherapeutic. Decrease to 1000 units/hr 6/19 @ 0140 aPTT = 89, therapeutic, HL >1.10 not correlating 6/19 @ 1014 aPTT= 63  subtherapeutic 6/19 @ 1945 aPTT= 72, therapeutic @ 1100 units/hr  Goal of Therapy:  Heparin level 0.3-0.7 units/ml Monitor platelets by anticoagulation protocol: Yes Target aPTT 66-102   Plan:  6/19 @ 1945 aPTT= 72 therapeutic Continue heparin infusion to rate at 1100 units/hr Due to PTA DOAC, will follow aPTT until correlation w/  HL Repeat aPTT in 8 hrs to confirm, then daily. Daily HL and CBC while on heparin.  Darnelle Bos, PharmD Clinical Pharmacist 10/22/2020

## 2020-10-23 ENCOUNTER — Encounter: Payer: Self-pay | Admitting: Cardiovascular Disease

## 2020-10-23 ENCOUNTER — Encounter
Admission: EM | Disposition: A | Discharge status: Home / Self Care | Payer: Self-pay | Source: Home / Self Care | Attending: Internal Medicine

## 2020-10-23 DIAGNOSIS — R079 Chest pain, unspecified: Secondary | ICD-10-CM | POA: Diagnosis not present

## 2020-10-23 DIAGNOSIS — I5031 Acute diastolic (congestive) heart failure: Secondary | ICD-10-CM | POA: Diagnosis not present

## 2020-10-23 DIAGNOSIS — E039 Hypothyroidism, unspecified: Secondary | ICD-10-CM | POA: Diagnosis not present

## 2020-10-23 HISTORY — PX: LEFT HEART CATH AND CORONARY ANGIOGRAPHY: CATH118249

## 2020-10-23 LAB — BASIC METABOLIC PANEL
Anion gap: 6 (ref 5–15)
BUN: 12 mg/dL (ref 8–23)
CO2: 23 mmol/L (ref 22–32)
Calcium: 7.7 mg/dL — ABNORMAL LOW (ref 8.9–10.3)
Chloride: 111 mmol/L (ref 98–111)
Creatinine, Ser: 0.94 mg/dL (ref 0.61–1.24)
GFR, Estimated: >60 mL/min (ref >60)
Glucose, Bld: 97 mg/dL (ref 70–99)
Potassium: 3.5 mmol/L (ref 3.5–5.1)
Sodium: 140 mmol/L (ref 135–145)

## 2020-10-23 LAB — PROTIME-INR
INR: 1.2 (ref 0.8–1.2)
Prothrombin Time: 15 seconds (ref 11.4–15.2)

## 2020-10-23 LAB — CBC
HCT: 24.4 % — ABNORMAL LOW (ref 39.0–52.0)
Hemoglobin: 8.1 g/dL — ABNORMAL LOW (ref 13.0–17.0)
MCH: 37.3 pg — ABNORMAL HIGH (ref 26.0–34.0)
MCHC: 33.2 g/dL (ref 30.0–36.0)
MCV: 112.4 fL — ABNORMAL HIGH (ref 80.0–100.0)
Platelets: 203 10*3/uL (ref 150–400)
RBC: 2.17 MIL/uL — ABNORMAL LOW (ref 4.22–5.81)
RDW: 17.8 % — ABNORMAL HIGH (ref 11.5–15.5)
WBC: 1.1 10*3/uL — CL (ref 4.0–10.5)
nRBC: 1.8 % — ABNORMAL HIGH (ref 0.0–0.2)

## 2020-10-23 LAB — HEPARIN LEVEL (UNFRACTIONATED): Heparin Unfractionated: 0.31 IU/mL (ref 0.30–0.70)

## 2020-10-23 LAB — MAGNESIUM: Magnesium: 2 mg/dL (ref 1.7–2.4)

## 2020-10-23 LAB — APTT: aPTT: 85 seconds — ABNORMAL HIGH (ref 24–36)

## 2020-10-23 SURGERY — LEFT HEART CATH AND CORONARY ANGIOGRAPHY
Anesthesia: Moderate Sedation

## 2020-10-23 MED ORDER — PANTOPRAZOLE SODIUM 40 MG PO TBEC: 40.0000 mg | DELAYED_RELEASE_TABLET | Freq: Every day | ORAL | 0 refills | Status: DC

## 2020-10-23 MED ORDER — HEPARIN (PORCINE) IN NACL 1000-0.9 UT/500ML-% IV SOLN
INTRAVENOUS | Status: AC
  Filled 2020-10-23: qty 1000

## 2020-10-23 MED ORDER — MIDAZOLAM HCL 2 MG/2ML IJ SOLN
INTRAMUSCULAR | Status: AC
  Filled 2020-10-23: qty 2

## 2020-10-23 MED ORDER — SODIUM CHLORIDE 0.9% FLUSH: 3.0000 mL | INTRAVENOUS | Status: DC | PRN

## 2020-10-23 MED ORDER — HYDRALAZINE HCL 20 MG/ML IJ SOLN: 10.0000 mg | INTRAMUSCULAR | Status: DC | PRN

## 2020-10-23 MED ORDER — POTASSIUM CHLORIDE 20 MEQ PO PACK: 40.0000 meq | PACK | Freq: Once | ORAL | Status: DC

## 2020-10-23 MED ORDER — LIDOCAINE HCL (PF) 1 % IJ SOLN
INTRAMUSCULAR | Status: AC
  Filled 2020-10-23: qty 30

## 2020-10-23 MED ORDER — SODIUM CHLORIDE 0.9 % WEIGHT BASED INFUSION
1.0000 mL/kg/h | INTRAVENOUS | Status: DC
  Administered 2020-10-23: 1 mL/kg/h via INTRAVENOUS

## 2020-10-23 MED ORDER — MIDAZOLAM HCL 2 MG/2ML IJ SOLN
INTRAMUSCULAR | Status: DC | PRN
  Administered 2020-10-23: 1 mg via INTRAVENOUS

## 2020-10-23 MED ORDER — SODIUM CHLORIDE 0.9 % WEIGHT BASED INFUSION: 1.0000 mL/kg/h | INTRAVENOUS | Status: DC

## 2020-10-23 MED ORDER — METOPROLOL TARTRATE 25 MG PO TABS: 25.0000 mg | ORAL_TABLET | Freq: Two times a day (BID) | ORAL | 0 refills | Status: DC

## 2020-10-23 MED ORDER — SODIUM CHLORIDE 0.9 % WEIGHT BASED INFUSION
3.0000 mL/kg/h | INTRAVENOUS | Status: AC
  Administered 2020-10-23: 3 mL/kg/h via INTRAVENOUS

## 2020-10-23 MED ORDER — SODIUM CHLORIDE 0.9 % IV SOLN: 250.0000 mL | INTRAVENOUS | Status: DC | PRN

## 2020-10-23 MED ORDER — LIDOCAINE HCL (PF) 1 % IJ SOLN
INTRAMUSCULAR | Status: DC | PRN
  Administered 2020-10-23: 22 mL

## 2020-10-23 MED ORDER — FENTANYL CITRATE (PF) 100 MCG/2ML IJ SOLN
INTRAMUSCULAR | Status: DC | PRN
  Administered 2020-10-23: 25 ug via INTRAVENOUS

## 2020-10-23 MED ORDER — LABETALOL HCL 5 MG/ML IV SOLN: 10.0000 mg | INTRAVENOUS | Status: DC | PRN

## 2020-10-23 MED ORDER — FENTANYL CITRATE (PF) 100 MCG/2ML IJ SOLN
INTRAMUSCULAR | Status: AC
  Filled 2020-10-23: qty 2

## 2020-10-23 MED ORDER — ONDANSETRON HCL 4 MG/2ML IJ SOLN: 4.0000 mg | Freq: Four times a day (QID) | INTRAMUSCULAR | Status: DC | PRN

## 2020-10-23 MED ORDER — ASPIRIN 81 MG PO CHEW: 81.0000 mg | CHEWABLE_TABLET | ORAL | Status: DC

## 2020-10-23 MED ORDER — ACETAMINOPHEN 325 MG PO TABS: 650.0000 mg | ORAL_TABLET | ORAL | Status: DC | PRN

## 2020-10-23 MED ORDER — HEPARIN (PORCINE) IN NACL 1000-0.9 UT/500ML-% IV SOLN
INTRAVENOUS | Status: DC | PRN
  Administered 2020-10-23 (×2): 500 mL

## 2020-10-23 MED ORDER — ASPIRIN 81 MG PO CHEW
81.0000 mg | CHEWABLE_TABLET | ORAL | Status: AC
  Administered 2020-10-23: 81 mg via ORAL
  Filled 2020-10-23: qty 1

## 2020-10-23 MED ORDER — IOHEXOL 300 MG/ML  SOLN
INTRAMUSCULAR | Status: DC | PRN
  Administered 2020-10-23: 78 mL

## 2020-10-23 MED ORDER — SODIUM CHLORIDE 0.9 % WEIGHT BASED INFUSION: 3.0000 mL/kg/h | INTRAVENOUS | Status: DC

## 2020-10-23 MED ORDER — SODIUM CHLORIDE 0.9% FLUSH: 3.0000 mL | Freq: Two times a day (BID) | INTRAVENOUS | Status: DC

## 2020-10-23 SURGICAL SUPPLY — 13 items
CATH INFINITI 5 FR JL3.5 (CATHETERS) ×2 IMPLANT
CATH INFINITI 5FR JL4 (CATHETERS) ×2 IMPLANT
CATH INFINITI 5FR JL5 (CATHETERS) ×2 IMPLANT
CATH INFINITI JR4 5F (CATHETERS) ×2 IMPLANT
DEVICE CLOSURE MYNXGRIP 5F (Vascular Products) ×2 IMPLANT
KIT SYRINGE INJ CVI SPIKEX1 (MISCELLANEOUS) ×2 IMPLANT
NEEDLE PERC 18GX7CM (NEEDLE) ×2 IMPLANT
PACK CARDIAC CATH (CUSTOM PROCEDURE TRAY) ×2 IMPLANT
PROTECTION STATION PRESSURIZED (MISCELLANEOUS) ×2
SET ATX SIMPLICITY (MISCELLANEOUS) ×2 IMPLANT
SHEATH AVANTI 5FR X 11CM (SHEATH) ×2 IMPLANT
STATION PROTECTION PRESSURIZED (MISCELLANEOUS) ×1 IMPLANT
WIRE GUIDERIGHT .035X150 (WIRE) ×2 IMPLANT

## 2020-10-23 NOTE — Progress Notes (Signed)
DC Heparin at this time per Secure Chat with MD Humphrey Rolls.

## 2020-10-23 NOTE — Progress Notes (Signed)
[place] for heparin infusion Indication: ACS/nSTEMI    (apixaban PTA)  No Known Allergies  Patient Measurements: Height: 5\' 8"  (172.7 cm) Weight: 82.8 kg (182 lb 8 oz) IBW/kg (Calculated) : 68.4 Heparin Dosing Weight: 82.8 kg  Vital Signs: Temp: 98.4 F (36.9 C) (06/20 0423) Temp Source: Oral (06/20 0423) BP: 122/60 (06/20 0423) Pulse Rate: 66 (06/20 0423)  Labs: Recent Labs    10/20/20 1949 10/20/20 2209 10/21/20 0219 10/21/20 4401 10/21/20 0915 10/22/20 0140 10/22/20 1014 10/22/20 1945 10/23/20 0218  HGB 8.4*  --   --   --   --  8.4*  --   --  8.1*  HCT 25.4*  --   --   --   --  25.7*  --   --  24.4*  PLT 170  --   --   --   --  184  --   --  203  APTT  --   --  63*  --    < > 89* 63* 72* 85*  LABPROT  --   --  20.8*  --   --   --   --   --  15.0  INR  --   --  1.8*  --   --   --   --   --  1.2  HEPARINUNFRC  --   --  >1.10*  --   --  >1.10*  --   --  0.31  CREATININE 1.08  --   --   --   --  1.05  --   --  0.94  TROPONINIHS 150* 135* 116* 103*  --   --   --   --   --    < > = values in this interval not displayed.     Estimated Creatinine Clearance: 71.3 mL/min (by C-G formula based on SCr of 0.94 mg/dL).   Medical History: Past Medical History:  Diagnosis Date   Anemia    Hypothyroidism     Medications:  Pt on Eliquis 5 mg BID  Assessment: Pt is 75 yo male presenting to ER from urgent care for evaluation of chest pain.  6/18 @ 0915 aPTT= 92 therapeutic.  Cont @ 1150 untis/hr 6/18 @ 1706 aPTT= 128, supratherapeutic. Decrease to 1000 units/hr 6/19 @ 0140 aPTT = 89, therapeutic, HL >1.10 not correlating 6/19 @ 1014 aPTT= 63  subtherapeutic 6/19 @ 1945 aPTT= 72, therapeutic @ 1100 units/hr 6/20 @ 0218 aPTT = 85., therapeutic, HL 0.31  Goal of Therapy:  Heparin level 0.3-0.7 units/ml Monitor platelets by anticoagulation protocol: Yes Target aPTT 66-102   Plan:  Continue heparin infusion to rate at 1100  units/hr Due to PTA DOAC, will follow aPTT until correlation w/ HL Repeat aPTT and HL with AM labs. Daily HL and CBC while on heparin.  Renda Rolls, PharmD, [facility] 10/23/2020 6:22 AM

## 2020-10-23 NOTE — Discharge Summary (Signed)
Physician Discharge Summary  Patient ID: Gordon Farrell MRN: [id number] DOB/AGE: 1946-04-17 75 y.o.  Admit date: 10/20/2020 Discharge date: 10/23/2020  Admission Diagnoses:  Discharge Diagnoses:  Principal Problem:   Chest pain Active Problems:   Acquired hypothyroidism   Deep vein thrombosis (DVT) (HCC)   Unspecified atrial fibrillation (HCC)   Acute CHF (congestive heart failure) ([place])   Dysphagia   Discharged Condition: good  Hospital Course:  Lindon Kiel is a 75 y.o. male with medical history significant for Crohn's disease, DVT, hypothyroidism and BPH who presents to the emergency room from the urgent care for evaluation of chest pain.  He is a peak troponin 150, he has been seen by cardiology, scheduled for heart cath on Monday.  He is also found to have new onset atrial fibrillation.  He was previously on anticoagulation for DVT, switched to heparin.  #1.  Chest pain with mild elevated troponin. New onset atrial fibrillation. Acute on chronic diastolic congestive heart failure.  Reviewed echocardiogram, ejection fraction 55 to 60%, no longer has any volume overload. Heart cath is performed today, showed nonobstructive CAD as below.  Currently, patient symptom has resolved, he is medically stable to be discharged. Will resume Eliquis for anticoagulation.  2.  Dysphagia. Follow-up with GI as outpatient.  3.  Pancytopenia. Iron B12 level normal peer reviewed previous labs from outside hospital, patient has a chronic anemia and leukopenia.   4.  Hypokalemia. Repleted.      Consults: cardiology  Significant Diagnostic Studies:  Heart cath: Mid Cx lesion is 30% stenosed.   Cardiac catheterization revealed 30% disease in the mid left circumflex with normal LAD and right coronary and normal ejection fraction on echocardiogram.  Patient has no significant coronary artery disease and can be discharged with follow-up this Thursday at 9 AM in my office.  Echo:  Left  ventricular ejection fraction, by estimation, is 55 to 60%. The left ventricle has normal function. The left ventricle has no regional wall motion abnormalities. Left ventricular diastolic parameters were normal. 2. Right ventricular systolic function is normal. The right ventricular size is normal. 3. The mitral valve is normal in structure. Trivial mitral valve regurgitation. No evidence of mitral stenosis. 4. The aortic valve is normal in structure. Aortic valve regurgitation is not visualized. Mild aortic valve sclerosis is present, with no evidence of aortic valve stenosis. 5. The inferior vena cava is normal in size with greater than 50% respiratory variability, suggesting right atrial pressure of 3 mmHg.   Treatments: Heart cath  Discharge Exam: Blood pressure (!) 110/49, pulse 68, temperature 98.3 F (36.8 C), resp. rate 18, height 5\' 8"  (1.727 m), weight 83.5 kg, SpO2 100 %. General appearance: alert and cooperative Resp: clear to auscultation bilaterally Cardio: regular rate and rhythm, S1, S2 normal, no murmur, click, rub or gallop GI: soft, non-tender; bowel sounds normal; no masses,  no organomegaly Extremities: extremities normal, atraumatic, no cyanosis or edema  Disposition: Discharge disposition: 01-Home or Self Care       Discharge Instructions     Diet - low sodium heart healthy   Complete by: As directed    Increase activity slowly   Complete by: As directed       Allergies as of 10/23/2020   No Known Allergies      Medication List     STOP taking these medications    oxybutynin 5 MG tablet Commonly known as: DITROPAN   tamsulosin 0.4 MG Caps capsule Commonly known as: FLOMAX  TAKE these medications    atorvastatin 40 MG tablet Commonly known as: LIPITOR Take 40 mg by mouth daily.   Eliquis 5 MG Tabs tablet Generic drug: apixaban Take 5 mg by mouth 2 (two) times daily.   finasteride 5 MG tablet Commonly known as:  PROSCAR Take 5 mg by mouth daily.   gabapentin 100 MG capsule Commonly known as: NEURONTIN Take 100 mg by mouth 3 (three) times daily.   latanoprost 0.005 % ophthalmic solution Commonly known as: XALATAN Place 1 drop into both eyes at bedtime.   levothyroxine 100 MCG tablet Commonly known as: SYNTHROID Take 100 mcg by mouth daily.   metoprolol tartrate 25 MG tablet Commonly known as: LOPRESSOR Take 1 tablet (25 mg total) by mouth 2 (two) times daily.   pantoprazole 40 MG tablet Commonly known as: Protonix Take 1 tablet (40 mg total) by mouth daily.   pilocarpine 5 MG tablet Commonly known as: SALAGEN Take 5 mg by mouth in the morning, at noon, and at bedtime.   pyridoxine 100 MG tablet Commonly known as: B-6 Take 100 mg by mouth daily.   sertraline 100 MG tablet Commonly known as: ZOLOFT Take 100 mg by mouth daily.   timolol 0.5 % ophthalmic solution Commonly known as: TIMOPTIC Place 1 drop into the right eye 2 (two) times daily.   traZODone 100 MG tablet Commonly known as: DESYREL Take 100 mg by mouth at bedtime.        Follow-up Information     Danae Orleans, MD Follow up in 1 week(s).   Specialty: Internal Medicine Contact information: [place] [address] [phone]         Dionisio David, MD Follow up.   Specialty: Cardiology Why: as scheduled by Dr. Orlene Och information: [place] [id number] [phone]                 Signed: Sharen Hones 10/23/2020, 1:07 PM

## 2020-10-23 NOTE — Progress Notes (Signed)
SUBJECTIVE:Patient denies any chest pain   Vitals:   10/22/20 1954 10/23/20 0423 10/23/20 0715 10/23/20 0748  BP: (!) 107/58 122/60 121/71   Pulse: 88 66 96   Resp: 18 18 18    Temp: 99.1 F (37.3 C) 98.4 F (36.9 C) 98.6 F (37 C)   TempSrc: Oral Oral Oral   SpO2: 97% 95% 97% 99%  Weight:   83.5 kg   Height:   5\' 8"  (1.727 m)     Intake/Output Summary (Last 24 hours) at 10/23/2020 0177 Last data filed at 10/23/2020 9390 Gross per 24 hour  Intake 960 ml  Output 1375 ml  Net -415 ml    LABS: Basic Metabolic Panel: Recent Labs    10/22/20 0140 10/23/20 0218  NA 142 140  K 3.4* 3.5  CL 113* 111  CO2 24 23  GLUCOSE 107* 97  BUN 18 12  CREATININE 1.05 0.94  CALCIUM 8.0* 7.7*  MG 2.0 2.0   Liver Function Tests: Recent Labs    10/20/20 1949  AST 24  ALT 13  ALKPHOS 81  BILITOT 1.6*  PROT 5.8*  ALBUMIN 2.7*   Recent Labs    10/20/20 1949  LIPASE 28   CBC: Recent Labs    10/20/20 1949 10/22/20 0140 10/23/20 0218  WBC 2.3* 1.3* 1.1*  NEUTROABS 1.4*  --   --   HGB 8.4* 8.4* 8.1*  HCT 25.4* 25.7* 24.4*  MCV 111.9* 113.2* 112.4*  PLT 170 184 203   Cardiac Enzymes: No results for input(s): CKTOTAL, CKMB, CKMBINDEX, TROPONINI in the last 72 hours. BNP: Invalid input(s): POCBNP D-Dimer: No results for input(s): DDIMER in the last 72 hours. Hemoglobin A1C: No results for input(s): HGBA1C in the last 72 hours. Fasting Lipid Panel: No results for input(s): CHOL, HDL, LDLCALC, TRIG, CHOLHDL, LDLDIRECT in the last 72 hours. Thyroid Function Tests: Recent Labs    10/20/20 1949  TSH 4.734*   Anemia Panel: Recent Labs    10/21/20 1706  VITAMINB12 1,280*  TIBC 197*  IRON 41*     PHYSICAL EXAM General: Well developed, well nourished, in no acute distress HEENT:  Normocephalic and atramatic Neck:  No JVD.  Lungs: Clear bilaterally to auscultation and percussion. Heart: HRRR . Normal S1 and S2 without gallops or murmurs.  Abdomen: Bowel sounds  are positive, abdomen soft and non-tender  Msk:  Back normal, normal gait. Normal strength and tone for age. Extremities: No clubbing, cyanosis or edema.   Neuro: Alert and oriented X 3. Psych:  Good affect, responds appropriately  TELEMETRY:afib  ASSESSMENT AND PLAN: Cardiac cath had no significant disease, normal LVEF, can restart Elliquis, and can go home with f/u this Thursday 9 AM.  Principal Problem:   Chest pain Active Problems:   Acquired hypothyroidism   Deep vein thrombosis (DVT) (HCC)   Unspecified atrial fibrillation (HCC)   Acute CHF (congestive heart failure) ([place])   Dysphagia    Gordon Farrell A, MD, [facility] 10/23/2020 8:22 AM

## 2020-10-23 NOTE — Progress Notes (Signed)
Education about discharge completed at this time. All questions answered. Wife and brother in law on way to pick up patient. Vital signs WDL at this time.

## 2020-10-26 ENCOUNTER — Observation Stay: Payer: Medicare Other

## 2020-10-26 ENCOUNTER — Other Ambulatory Visit: Payer: Self-pay

## 2020-10-26 ENCOUNTER — Encounter: Payer: Self-pay | Admitting: Radiology

## 2020-10-26 ENCOUNTER — Emergency Department: Payer: Medicare Other

## 2020-10-26 ENCOUNTER — Inpatient Hospital Stay
Admission: EM | Admit: 2020-10-26 | Discharge: 2020-11-04 | DRG: 919 | Disposition: A | Discharge status: Home Health | Payer: Medicare Other | Attending: Internal Medicine | Admitting: Internal Medicine

## 2020-10-26 DIAGNOSIS — R682 Dry mouth, unspecified: Secondary | ICD-10-CM | POA: Diagnosis present

## 2020-10-26 DIAGNOSIS — L7632 Postprocedural hematoma of skin and subcutaneous tissue following other procedure: Secondary | ICD-10-CM | POA: Diagnosis not present

## 2020-10-26 DIAGNOSIS — F32A Depression, unspecified: Secondary | ICD-10-CM | POA: Diagnosis present

## 2020-10-26 DIAGNOSIS — Z20822 Contact with and (suspected) exposure to covid-19: Secondary | ICD-10-CM | POA: Diagnosis present

## 2020-10-26 DIAGNOSIS — I480 Paroxysmal atrial fibrillation: Secondary | ICD-10-CM | POA: Diagnosis present

## 2020-10-26 DIAGNOSIS — K5010 Crohn's disease of large intestine without complications: Secondary | ICD-10-CM | POA: Diagnosis present

## 2020-10-26 DIAGNOSIS — Z86711 Personal history of pulmonary embolism: Secondary | ICD-10-CM

## 2020-10-26 DIAGNOSIS — Z9049 Acquired absence of other specified parts of digestive tract: Secondary | ICD-10-CM

## 2020-10-26 DIAGNOSIS — N401 Enlarged prostate with lower urinary tract symptoms: Secondary | ICD-10-CM | POA: Diagnosis present

## 2020-10-26 DIAGNOSIS — R0602 Shortness of breath: Secondary | ICD-10-CM | POA: Diagnosis not present

## 2020-10-26 DIAGNOSIS — M7989 Other specified soft tissue disorders: Secondary | ICD-10-CM

## 2020-10-26 DIAGNOSIS — Y838 Other surgical procedures as the cause of abnormal reaction of the patient, or of later complication, without mention of misadventure at the time of the procedure: Secondary | ICD-10-CM | POA: Diagnosis present

## 2020-10-26 DIAGNOSIS — D649 Anemia, unspecified: Secondary | ICD-10-CM | POA: Diagnosis present

## 2020-10-26 DIAGNOSIS — G4700 Insomnia, unspecified: Secondary | ICD-10-CM | POA: Diagnosis present

## 2020-10-26 DIAGNOSIS — D62 Acute posthemorrhagic anemia: Secondary | ICD-10-CM | POA: Diagnosis present

## 2020-10-26 DIAGNOSIS — I6389 Other cerebral infarction: Secondary | ICD-10-CM | POA: Diagnosis present

## 2020-10-26 DIAGNOSIS — Z9889 Other specified postprocedural states: Secondary | ICD-10-CM

## 2020-10-26 DIAGNOSIS — B952 Enterococcus as the cause of diseases classified elsewhere: Secondary | ICD-10-CM | POA: Diagnosis present

## 2020-10-26 DIAGNOSIS — I4891 Unspecified atrial fibrillation: Secondary | ICD-10-CM | POA: Diagnosis present

## 2020-10-26 DIAGNOSIS — E785 Hyperlipidemia, unspecified: Secondary | ICD-10-CM | POA: Diagnosis present

## 2020-10-26 DIAGNOSIS — Z86718 Personal history of other venous thrombosis and embolism: Secondary | ICD-10-CM

## 2020-10-26 DIAGNOSIS — T148XXA Other injury of unspecified body region, initial encounter: Secondary | ICD-10-CM

## 2020-10-26 DIAGNOSIS — W19XXXA Unspecified fall, initial encounter: Secondary | ICD-10-CM | POA: Diagnosis present

## 2020-10-26 DIAGNOSIS — I82409 Acute embolism and thrombosis of unspecified deep veins of unspecified lower extremity: Secondary | ICD-10-CM | POA: Diagnosis present

## 2020-10-26 DIAGNOSIS — F419 Anxiety disorder, unspecified: Secondary | ICD-10-CM | POA: Diagnosis present

## 2020-10-26 DIAGNOSIS — R29703 NIHSS score 3: Secondary | ICD-10-CM | POA: Diagnosis present

## 2020-10-26 DIAGNOSIS — D72819 Decreased white blood cell count, unspecified: Secondary | ICD-10-CM | POA: Diagnosis present

## 2020-10-26 DIAGNOSIS — D472 Monoclonal gammopathy: Secondary | ICD-10-CM | POA: Diagnosis present

## 2020-10-26 DIAGNOSIS — D61818 Other pancytopenia: Secondary | ICD-10-CM | POA: Diagnosis present

## 2020-10-26 DIAGNOSIS — Z7901 Long term (current) use of anticoagulants: Secondary | ICD-10-CM

## 2020-10-26 DIAGNOSIS — R531 Weakness: Secondary | ICD-10-CM

## 2020-10-26 DIAGNOSIS — Z79899 Other long term (current) drug therapy: Secondary | ICD-10-CM

## 2020-10-26 DIAGNOSIS — B961 Klebsiella pneumoniae [K. pneumoniae] as the cause of diseases classified elsewhere: Secondary | ICD-10-CM | POA: Diagnosis present

## 2020-10-26 DIAGNOSIS — N136 Pyonephrosis: Secondary | ICD-10-CM | POA: Diagnosis present

## 2020-10-26 DIAGNOSIS — G8929 Other chronic pain: Secondary | ICD-10-CM | POA: Diagnosis present

## 2020-10-26 DIAGNOSIS — Z8262 Family history of osteoporosis: Secondary | ICD-10-CM

## 2020-10-26 DIAGNOSIS — Z96641 Presence of right artificial hip joint: Secondary | ICD-10-CM | POA: Diagnosis present

## 2020-10-26 DIAGNOSIS — E876 Hypokalemia: Secondary | ICD-10-CM | POA: Diagnosis present

## 2020-10-26 DIAGNOSIS — R200 Anesthesia of skin: Secondary | ICD-10-CM | POA: Diagnosis present

## 2020-10-26 DIAGNOSIS — I639 Cerebral infarction, unspecified: Secondary | ICD-10-CM | POA: Diagnosis present

## 2020-10-26 DIAGNOSIS — N138 Other obstructive and reflux uropathy: Secondary | ICD-10-CM | POA: Diagnosis present

## 2020-10-26 DIAGNOSIS — R1031 Right lower quadrant pain: Secondary | ICD-10-CM

## 2020-10-26 DIAGNOSIS — I252 Old myocardial infarction: Secondary | ICD-10-CM

## 2020-10-26 DIAGNOSIS — M79674 Pain in right toe(s): Secondary | ICD-10-CM | POA: Diagnosis not present

## 2020-10-26 DIAGNOSIS — Z79890 Hormone replacement therapy (postmenopausal): Secondary | ICD-10-CM

## 2020-10-26 DIAGNOSIS — Y92009 Unspecified place in unspecified non-institutional (private) residence as the place of occurrence of the external cause: Secondary | ICD-10-CM

## 2020-10-26 DIAGNOSIS — R32 Unspecified urinary incontinence: Secondary | ICD-10-CM | POA: Diagnosis present

## 2020-10-26 DIAGNOSIS — E039 Hypothyroidism, unspecified: Secondary | ICD-10-CM | POA: Diagnosis present

## 2020-10-26 DIAGNOSIS — M4856XA Collapsed vertebra, not elsewhere classified, lumbar region, initial encounter for fracture: Secondary | ICD-10-CM | POA: Diagnosis present

## 2020-10-26 DIAGNOSIS — S32000A Wedge compression fracture of unspecified lumbar vertebra, initial encounter for closed fracture: Secondary | ICD-10-CM | POA: Diagnosis present

## 2020-10-26 HISTORY — DX: Cardiac arrhythmia, unspecified: I49.9

## 2020-10-26 LAB — COMPREHENSIVE METABOLIC PANEL
ALT: 19 U/L (ref 0–44)
AST: 28 U/L (ref 15–41)
Albumin: 2.6 g/dL — ABNORMAL LOW (ref 3.5–5.0)
Alkaline Phosphatase: 73 U/L (ref 38–126)
Anion gap: 6 (ref 5–15)
BUN: 13 mg/dL (ref 8–23)
CO2: 22 mmol/L (ref 22–32)
Calcium: 7.9 mg/dL — ABNORMAL LOW (ref 8.9–10.3)
Chloride: 109 mmol/L (ref 98–111)
Creatinine, Ser: 1.06 mg/dL (ref 0.61–1.24)
GFR, Estimated: >60 mL/min (ref >60)
Glucose, Bld: 110 mg/dL — ABNORMAL HIGH (ref 70–99)
Potassium: 3.1 mmol/L — ABNORMAL LOW (ref 3.5–5.1)
Sodium: 137 mmol/L (ref 135–145)
Total Bilirubin: 1.3 mg/dL — ABNORMAL HIGH (ref 0.3–1.2)
Total Protein: 5.9 g/dL — ABNORMAL LOW (ref 6.5–8.1)

## 2020-10-26 LAB — URINALYSIS, COMPLETE (UACMP) WITH MICROSCOPIC
Bilirubin Urine: NEGATIVE
Glucose, UA: NEGATIVE mg/dL
Ketones, ur: NEGATIVE mg/dL
Leukocytes,Ua: NEGATIVE
Nitrite: NEGATIVE
Protein, ur: 100 mg/dL — AB
RBC / HPF: >50 RBC/hpf — ABNORMAL HIGH (ref 0–5)
Specific Gravity, Urine: 1.017 (ref 1.005–1.030)
pH: 5 (ref 5.0–8.0)

## 2020-10-26 LAB — CBC WITH DIFFERENTIAL/PLATELET
Basophils Absolute: 0 10*3/uL (ref 0.0–0.1)
Basophils Relative: 2 %
Eosinophils Absolute: 0.2 10*3/uL (ref 0.0–0.5)
Eosinophils Relative: 13 %
HCT: 22.7 % — ABNORMAL LOW (ref 39.0–52.0)
Hemoglobin: 7.7 g/dL — ABNORMAL LOW (ref 13.0–17.0)
Lymphocytes Relative: 14 %
Lymphs Abs: 0.2 10*3/uL — ABNORMAL LOW (ref 0.7–4.0)
MCH: 37.7 pg — ABNORMAL HIGH (ref 26.0–34.0)
MCHC: 33.9 g/dL (ref 30.0–36.0)
MCV: 111.3 fL — ABNORMAL HIGH (ref 80.0–100.0)
Monocytes Absolute: 0.1 10*3/uL (ref 0.1–1.0)
Monocytes Relative: 6 %
Neutro Abs: 0.8 10*3/uL — ABNORMAL LOW (ref 1.7–7.7)
Neutrophils Relative %: 51 %
Platelets: 198 10*3/uL (ref 150–400)
RBC: 2.04 MIL/uL — ABNORMAL LOW (ref 4.22–5.81)
RDW: 17.7 % — ABNORMAL HIGH (ref 11.5–15.5)
WBC: 1.6 10*3/uL — ABNORMAL LOW (ref 4.0–10.5)
nRBC: 1.2 % — ABNORMAL HIGH (ref 0.0–0.2)

## 2020-10-26 LAB — CBC
HCT: 23 % — ABNORMAL LOW (ref 39.0–52.0)
Hemoglobin: 7.8 g/dL — ABNORMAL LOW (ref 13.0–17.0)
MCH: 36.1 pg — ABNORMAL HIGH (ref 26.0–34.0)
MCHC: 33.9 g/dL (ref 30.0–36.0)
MCV: 106.5 fL — ABNORMAL HIGH (ref 80.0–100.0)
Platelets: 165 10*3/uL (ref 150–400)
RBC: 2.16 MIL/uL — ABNORMAL LOW (ref 4.22–5.81)
RDW: 21.8 % — ABNORMAL HIGH (ref 11.5–15.5)
WBC: 1.8 10*3/uL — ABNORMAL LOW (ref 4.0–10.5)
nRBC: 0 % (ref 0.0–0.2)

## 2020-10-26 LAB — LACTIC ACID, PLASMA
Lactic Acid, Venous: 1.5 mmol/L (ref 0.5–1.9)
Lactic Acid, Venous: 1.2 mmol/L (ref 0.5–1.9)

## 2020-10-26 LAB — PROTIME-INR
INR: 1.5 — ABNORMAL HIGH (ref 0.8–1.2)
Prothrombin Time: 18.2 seconds — ABNORMAL HIGH (ref 11.4–15.2)

## 2020-10-26 LAB — PREPARE RBC (CROSSMATCH)

## 2020-10-26 LAB — TROPONIN I (HIGH SENSITIVITY)
Troponin I (High Sensitivity): 7 ng/L (ref <18)
Troponin I (High Sensitivity): 11 ng/L (ref <18)

## 2020-10-26 LAB — RESP PANEL BY RT-PCR (FLU A&B, COVID) ARPGX2
Influenza A by PCR: NEGATIVE
Influenza B by PCR: NEGATIVE
SARS Coronavirus 2 by RT PCR: NEGATIVE

## 2020-10-26 LAB — ABO/RH: ABO/RH(D): AB POS

## 2020-10-26 LAB — MAGNESIUM: Magnesium: 1.7 mg/dL (ref 1.7–2.4)

## 2020-10-26 LAB — BRAIN NATRIURETIC PEPTIDE: B Natriuretic Peptide: 115.5 pg/mL — ABNORMAL HIGH (ref 0.0–100.0)

## 2020-10-26 LAB — HEMOGLOBIN AND HEMATOCRIT, BLOOD
HCT: 25.9 % — ABNORMAL LOW (ref 39.0–52.0)
Hemoglobin: 8.7 g/dL — ABNORMAL LOW (ref 13.0–17.0)

## 2020-10-26 IMAGING — CT CT HEAD W/O CM
3 series · 15 of 47 positions shown, 18 images · non-contrast
Comparison: Brain MRI [DATE]

CLINICAL DATA: Minor head trauma

EXAM:
CT HEAD WITHOUT CONTRAST
CT CERVICAL SPINE WITHOUT CONTRAST
TECHNIQUE: Multidetector CT imaging of the head and cervical spine was
performed following the standard protocol without intravenous
contrast. Multiplanar CT image reconstructions of the cervical spine
were also generated.

[Series 3: head wo · axial · 0.46mm/px · z∈[-77,+58]mm · 9 of 33 slices shown, 12 images]
[im 3/33  brain]
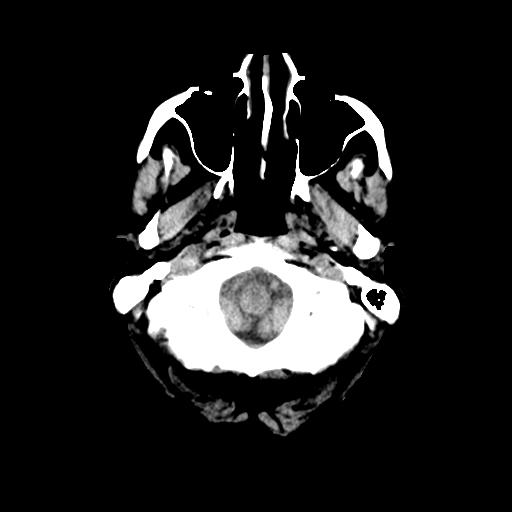
[im 3/33  bone]
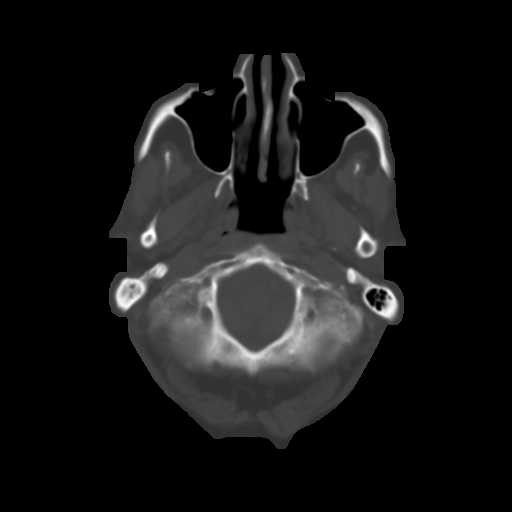
[im 6/33  brain]
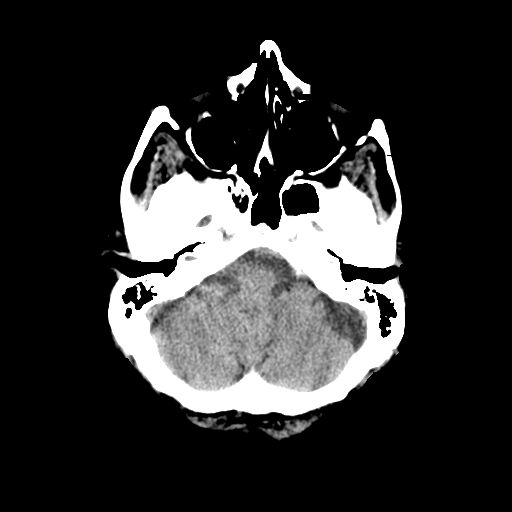
[im 9/33  brain]
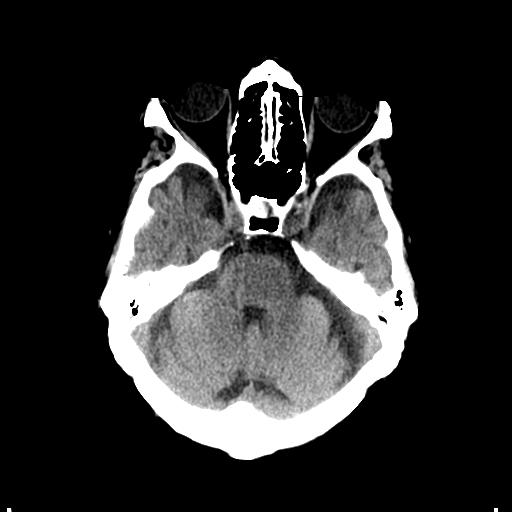
[im 13/33  brain]
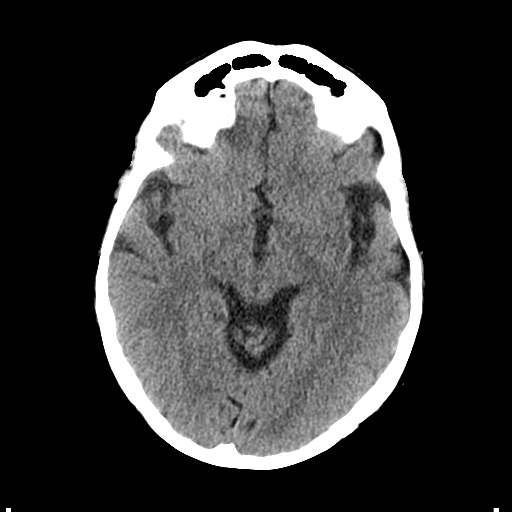
[im 17/33  brain]
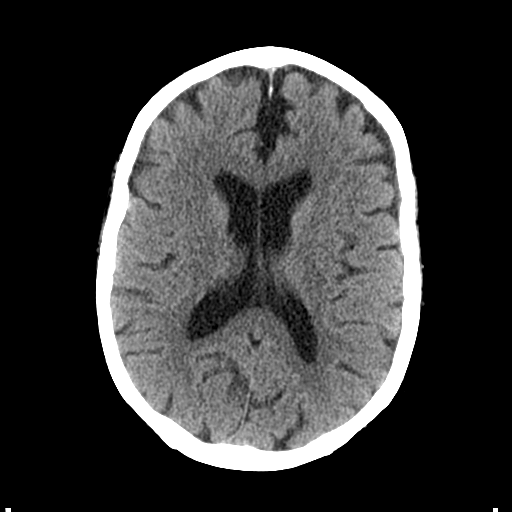
[im 17/33  bone]
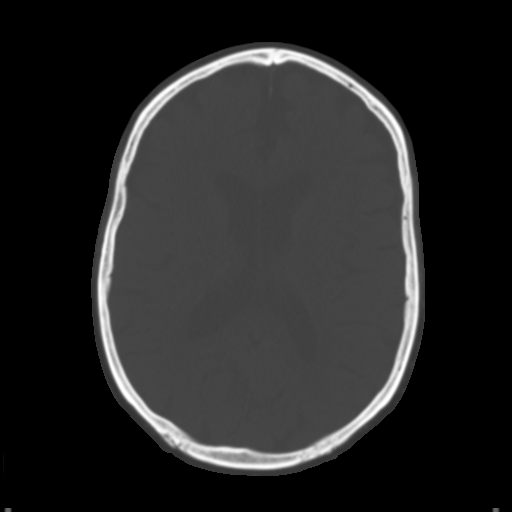
[im 20/33  brain]
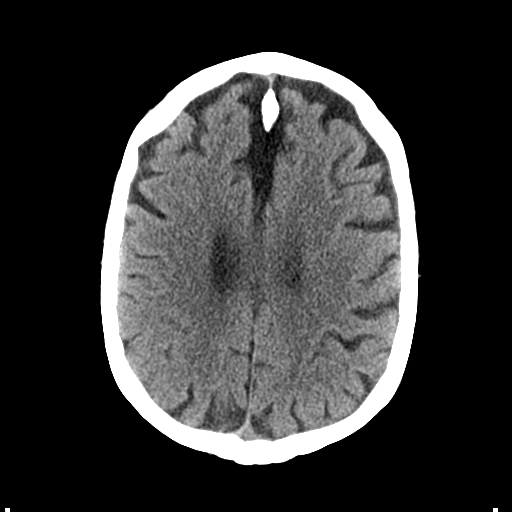
[im 24/33  brain]
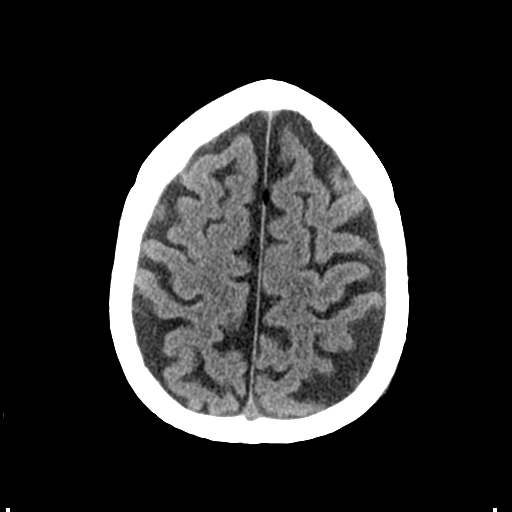
[im 27/33  brain]
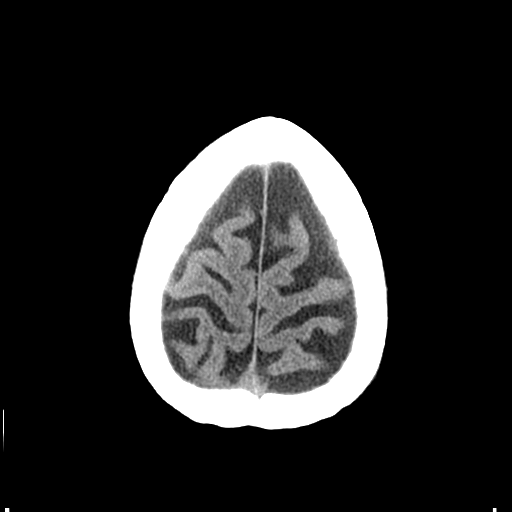
[im 30/33  brain]
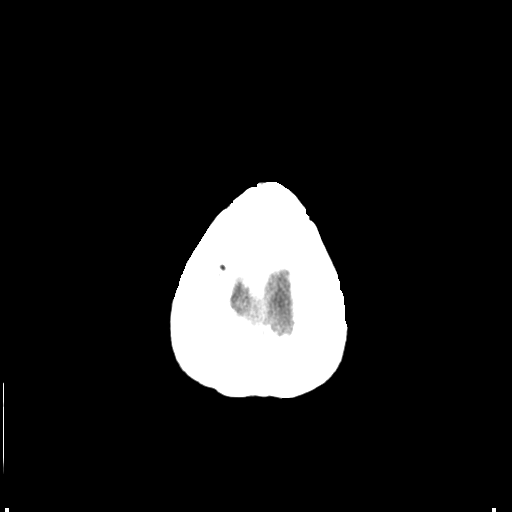
[im 30/33  bone]
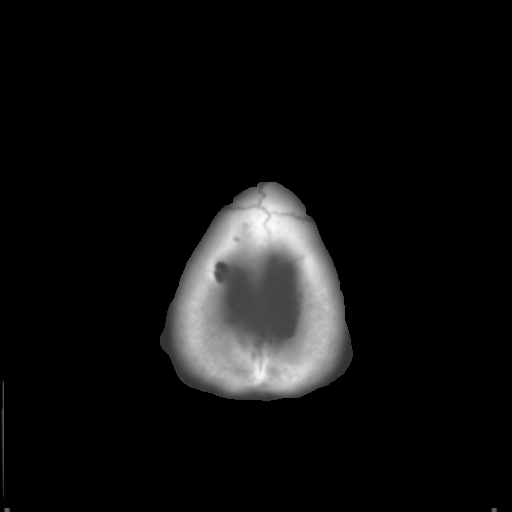

[Series 4: coronal soft tissue · coronal · 0.34mm/px · 3 of 75 slices shown]
[im 25/75  brain]
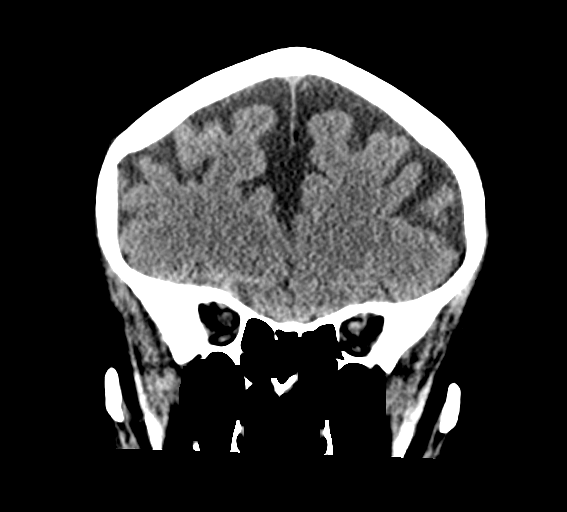
[im 33/75  brain]
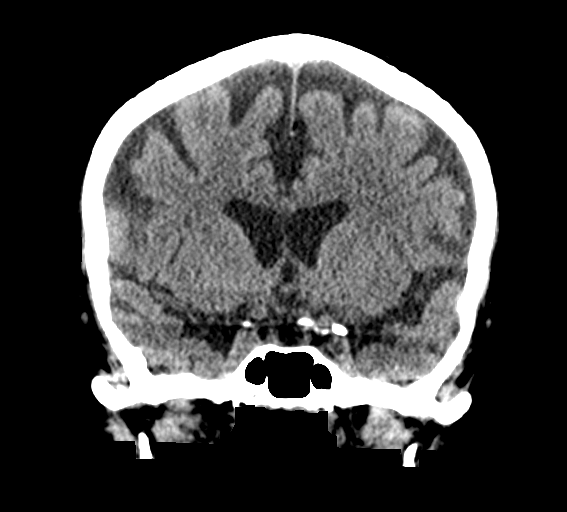
[im 42/75  brain]
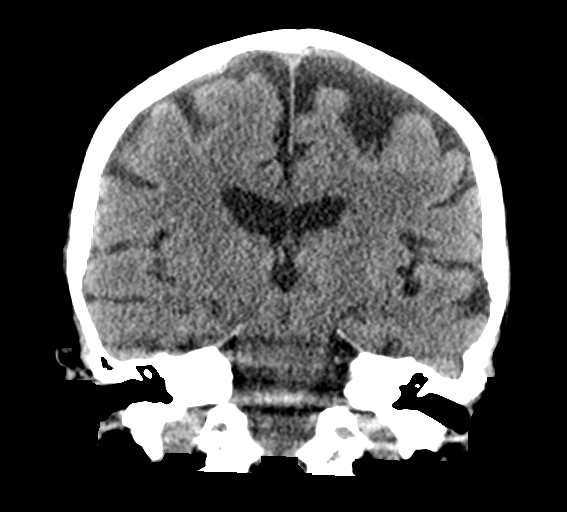

[Series 5: sagittal soft tissue · sagittal · 0.34mm/px · 3 of 53 slices shown]
[im 18/53  brain]
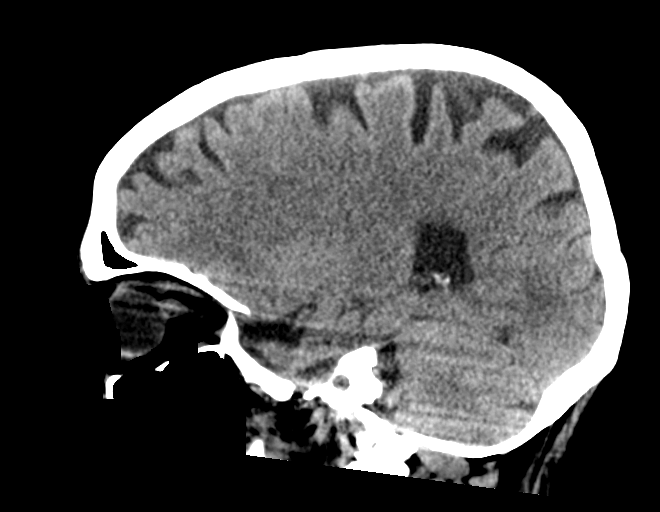
[im 27/53  brain]
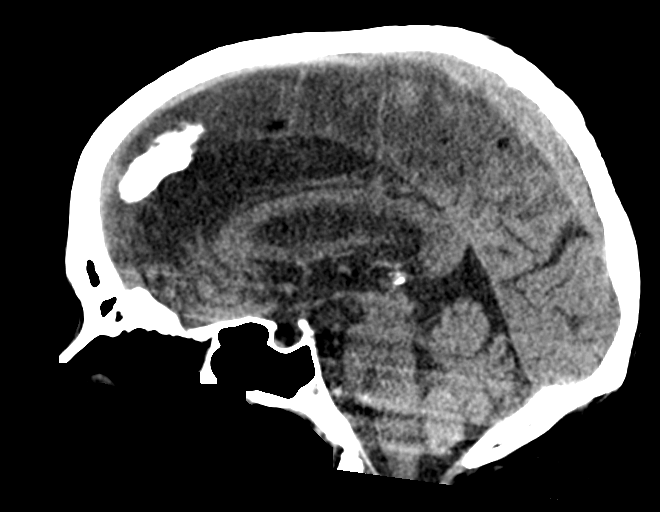
[im 35/53  brain]
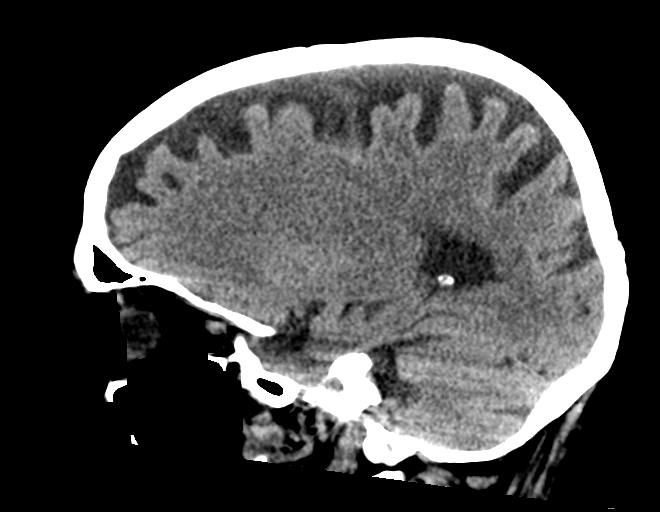

[15 of 47 positions shown; findings below may reference images not displayed]

FINDINGS: CT HEAD FINDINGS

Brain: No evidence of acute infarction, hemorrhage, hydrocephalus,
extra-axial collection or mass lesion/mass effect. Generalized
atrophy

Vascular: No hyperdense vessel or unexpected calcification.

Skull: Normal. Negative for fracture or focal lesion.

Sinuses/Orbits: No acute finding.

CT CERVICAL SPINE FINDINGS

Alignment: No traumatic malalignment

Skull base and vertebrae: No acute fracture. Remote T1 spinous
process fracture.

Soft tissues and spinal canal: No prevertebral fluid or swelling. No
visible canal hematoma.

Disc levels: Ordinary disc and facet degeneration which is
generalized.

Upper chest: Reported separately
IMPRESSION: No evidence of intracranial or cervical spine injury.

## 2020-10-26 IMAGING — MR MR LUMBAR SPINE WO/W CM
6 of 7 series · 31 of 48 positions shown · IV contrast (8ml Gadavist)
Comparison: Prior CT from [DATE].

CLINICAL DATA: Initial evaluation for numbness and tingling, recent
fall. Evaluate for stenosis.

EXAM:
MRI LUMBAR SPINE WITHOUT AND WITH CONTRAST
TECHNIQUE: Multiplanar and multiecho pulse sequences of the lumbar spine were
obtained without and with intravenous contrast.
CONTRAST:  8mL GADAVIST GADOBUTROL 1 MMOL/ML IV SOLN

[Series 5: T2 · sagittal · 4.0mm · 0.81mm/px · 5 of 17 slices shown (1 of 2)]
[im 1/17]
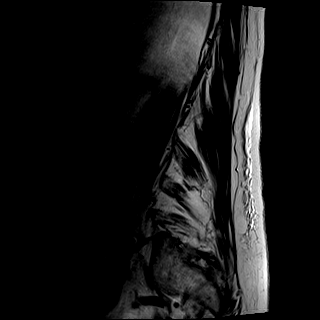
[im 5/17]
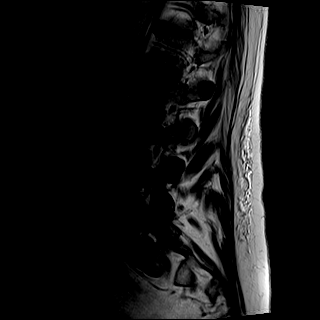
[im 9/17]
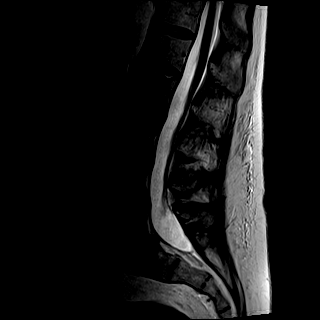
[im 13/17]
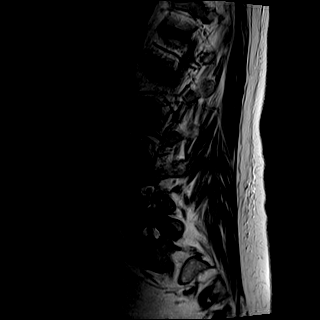
[im 17/17]
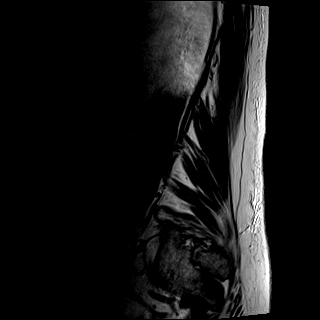

[Series 6: T1 · sagittal · 4.0mm · 0.81mm/px · 5 of 17 slices shown (1 of 2)]
[im 1/17]
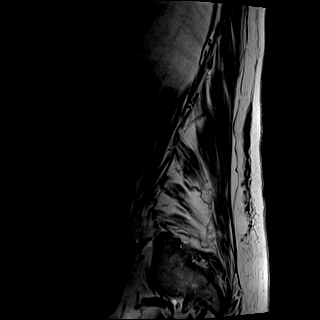
[im 5/17]
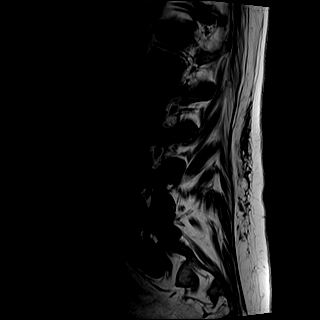
[im 9/17]
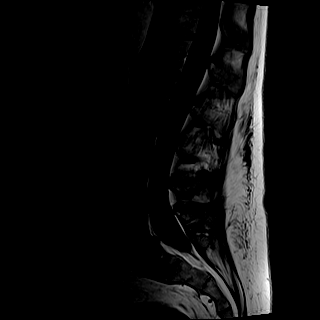
[im 13/17]
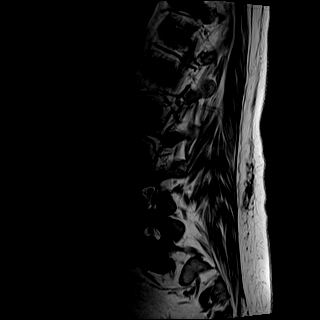
[im 17/17]
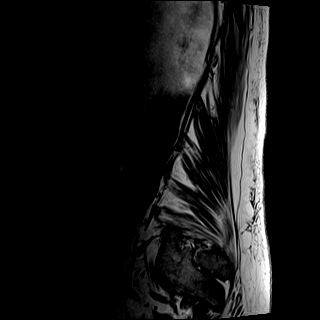

[Series 7: STIR · sagittal · 4.0mm · 0.41mm/px · 1 of 17 slices shown]
[im 1/17]
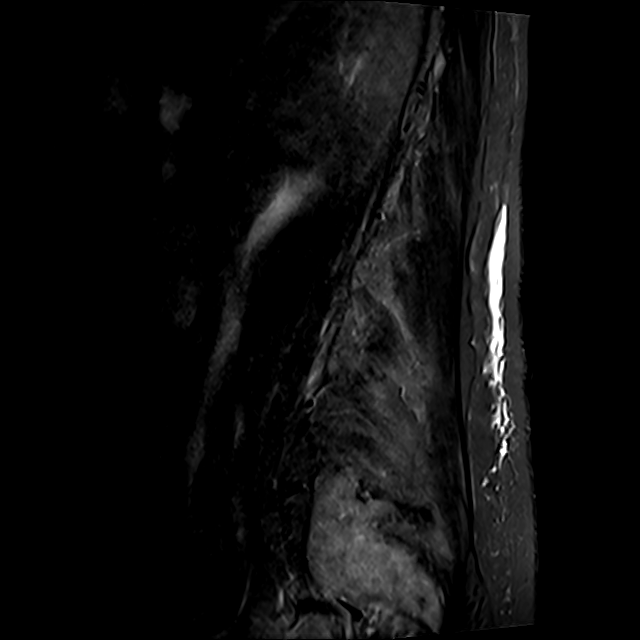

[Series 8: T2 · axial · 4.0mm · 0.78mm/px · z∈[-76,+159]mm · 8 of 40 slices shown (2 of 2)]
[im 1/40]
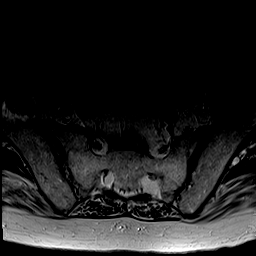
[im 5/40]
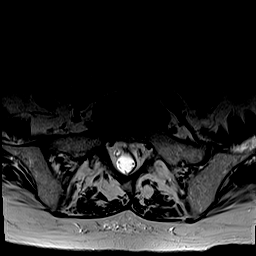
[im 14/40]
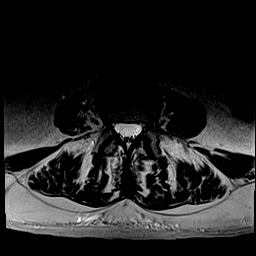
[im 18/40]
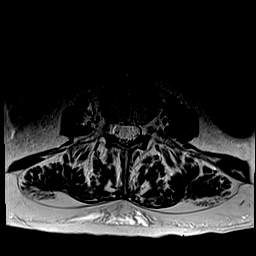
[im 22/40]
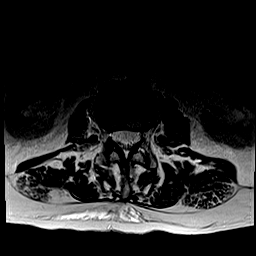
[im 27/40]
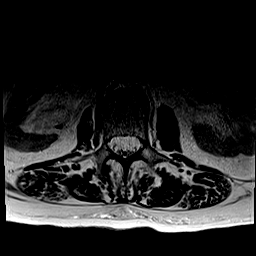
[im 35/40]
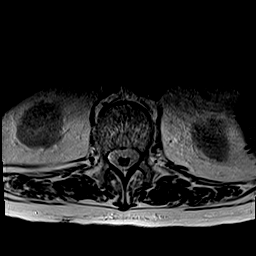
[im 40/40]
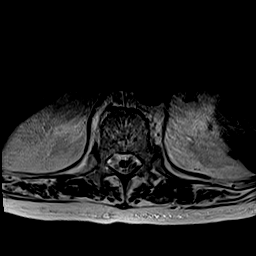

[Series 9: T1 · axial · 4.0mm · 0.39mm/px · z∈[-76,+159]mm · 8 of 40 slices shown (2 of 2)]
[im 1/40]
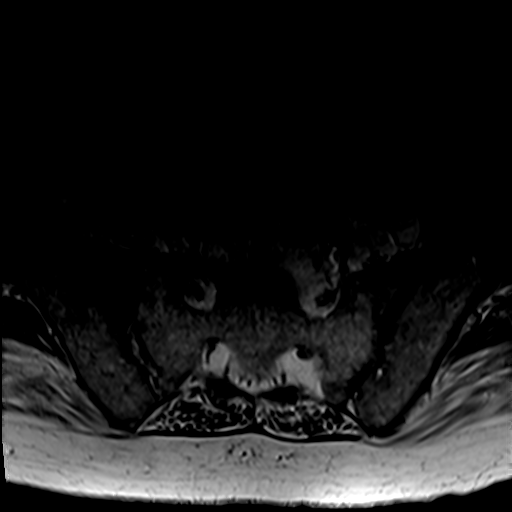
[im 5/40]
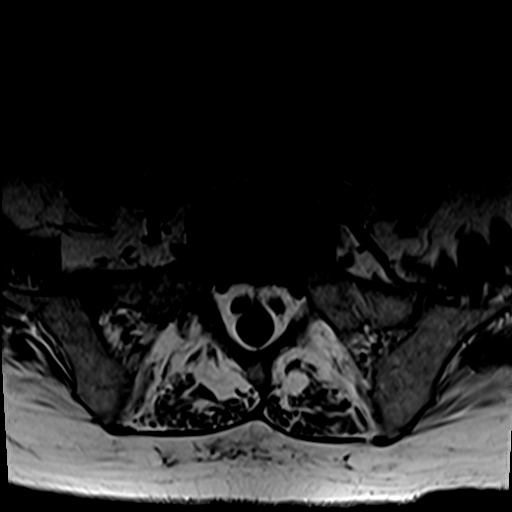
[im 14/40]
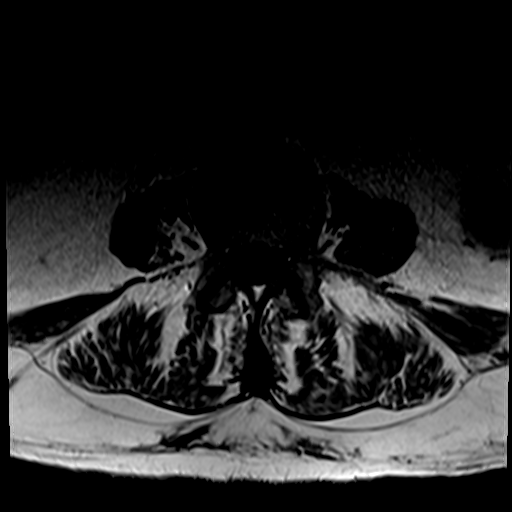
[im 18/40]
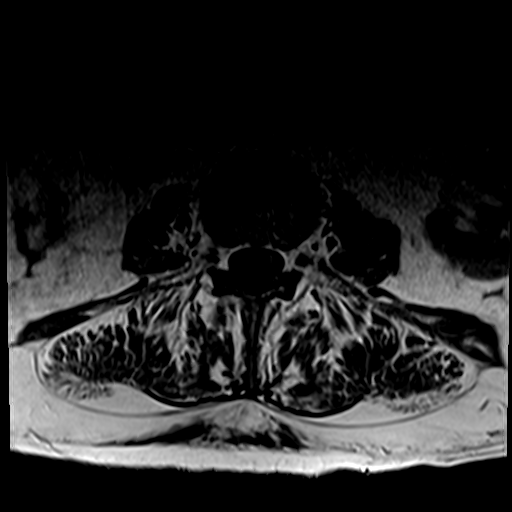
[im 22/40]
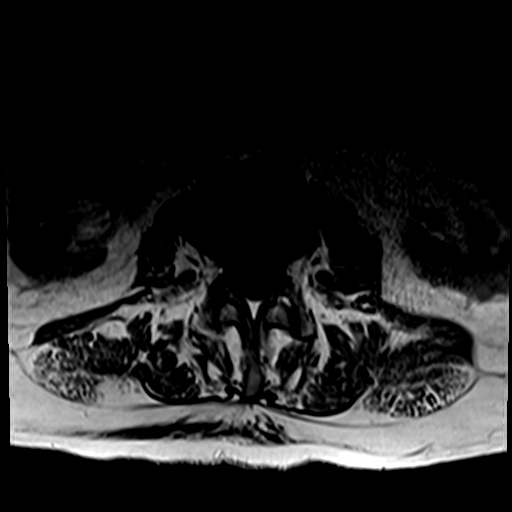
[im 27/40]
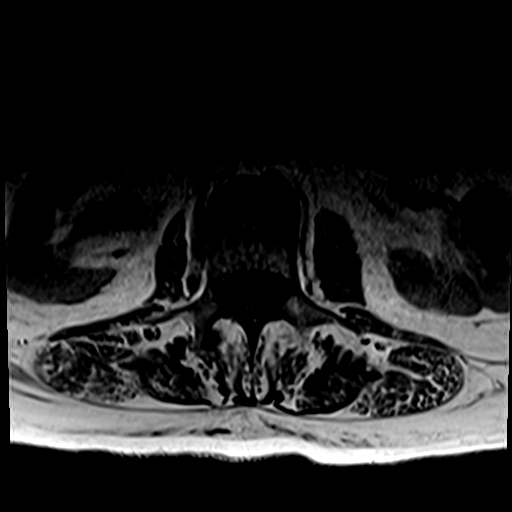
[im 35/40]
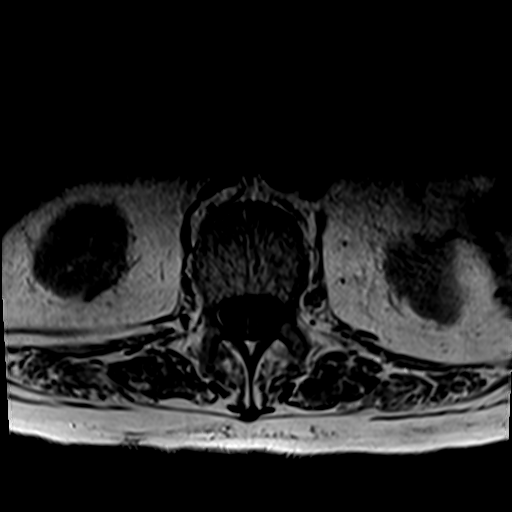
[im 40/40]
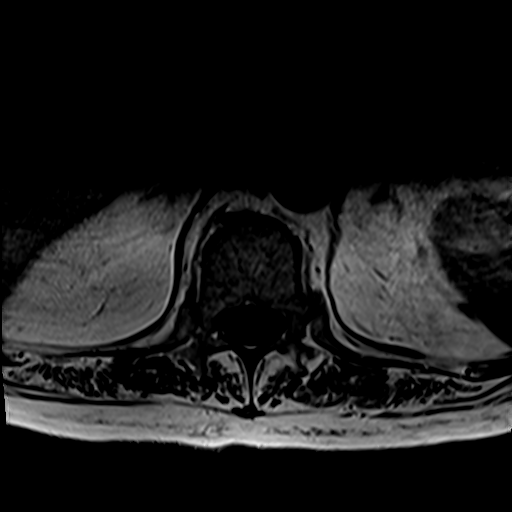

[Series 10: T1 fat-sat post-contrast · sagittal · 4.0mm · 0.81mm/px · 4 of 17 slices shown]
[im 1/17]
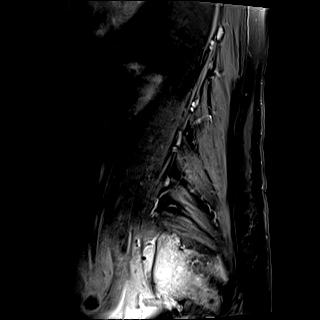
[im 6/17]
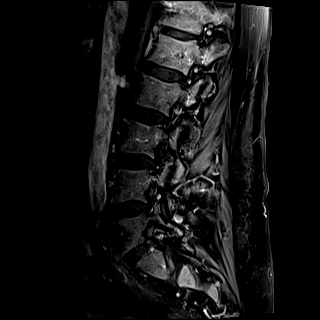
[im 11/17]
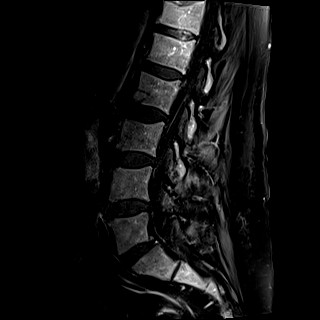
[im 17/17]
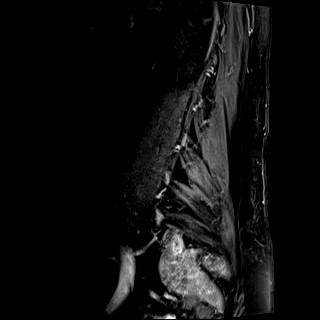

[31 of 48 positions shown; findings below may reference images not displayed]

FINDINGS: Segmentation: Standard. Lowest well-formed disc space labeled the
L5-S1 level.

Alignment: Trace retrolisthesis of L4 on L5, with 2 mm
anterolisthesis of L5 on S1. Findings chronic and facet mediated.
Alignment otherwise normal preservation of the normal lumbar
lordosis.

Vertebrae: Chronic T12 compression fracture, partially visualized.
Vertebral body height otherwise maintained with no other acute or
chronic fracture. Bone marrow signal intensity within normal limits.
No discrete or worrisome osseous lesions. No abnormal marrow edema
or enhancement.

Conus medullaris and cauda equina: Conus extends to the L1-2 level.
Conus and cauda equina appear normal.

Paraspinal and other soft tissues: Paraspinous soft tissues
demonstrate no acute finding. Few scattered benign appearing cyst
noted about the kidneys, better evaluated on prior CT. Visualized
visceral structures otherwise unremarkable.

Disc levels:

L1-2: Negative interspace. Mild facet hypertrophy. No stenosis or
impingement.

L2-3: Mild anterior endplate spurring without significant disc
bulge. Mild facet hypertrophy. No canal or foraminal stenosis.

L3-4: Disc desiccation with mild annular disc bulge. Mild facet
hypertrophy. No canal or foraminal stenosis. No impingement.

L4-5: Mild disc bulge with disc desiccation. Moderate left worse
than right facet hypertrophy. Superimposed tiny 4 mm cystic lesion
noted at the left ligamentum flavum, which could reflect a small
synovial cyst versus cystic ligamentous degeneration (series 8,
image 29). Resultant mild narrowing of the lateral recesses
bilaterally. Central canal remains patent. Mild left L4 foraminal
narrowing. Right neural foramina remains patent.

L5-S1: Trace anterolisthesis. Degenerative intervertebral disc space
narrowing with disc desiccation and mild disc bulge. Moderate
bilateral facet hypertrophy. No canal or lateral recess stenosis.
Mild right L5 foraminal narrowing. Left neural foramen remains
patent. No frank impingement.
IMPRESSION: 1. No evidence for acute injury related to recent fall.
2. Mild disc bulging with moderate facet hypertrophy at L4-5,
resulting in mild bilateral lateral recess stenosis with mild left
L4 foraminal narrowing.
3. Trace anterolisthesis of L5 on S1 with associated mild disc bulge
and moderate facet hypertrophy, resulting in mild right L5 foraminal
stenosis. No other significant stenosis or neural impingement within
the lumbar spine.
4. Chronic T12 compression fracture, partially visualized.

## 2020-10-26 IMAGING — US US EXTREM LOW*R* LIMITED
1 series · 14 of 20 positions shown · non-contrast
Comparison: [DATE]

CLINICAL DATA: Right groin hematoma

EXAM:
ULTRASOUND right LOWER EXTREMITY LIMITED
TECHNIQUE: Ultrasound examination of the lower extremity soft tissues was
performed in the area of clinical concern.

[Series 1: us soft tissue lower extremity limited right (non- · 14 of 20 slices shown]
[im 1/20]
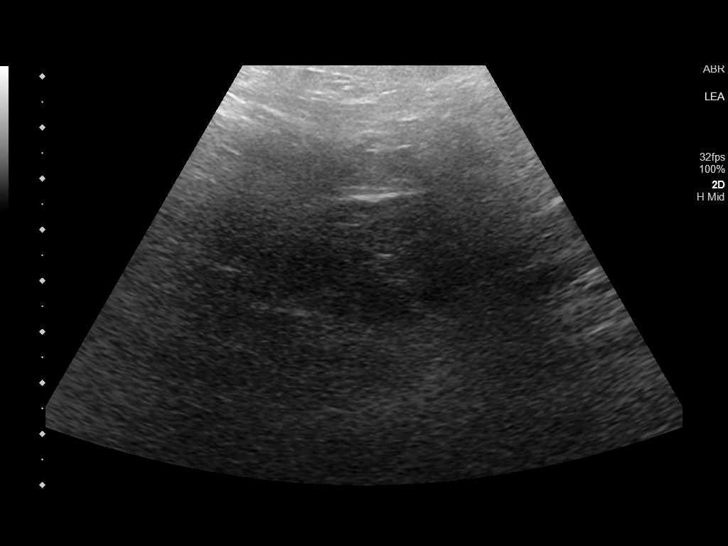
[im 3/20]
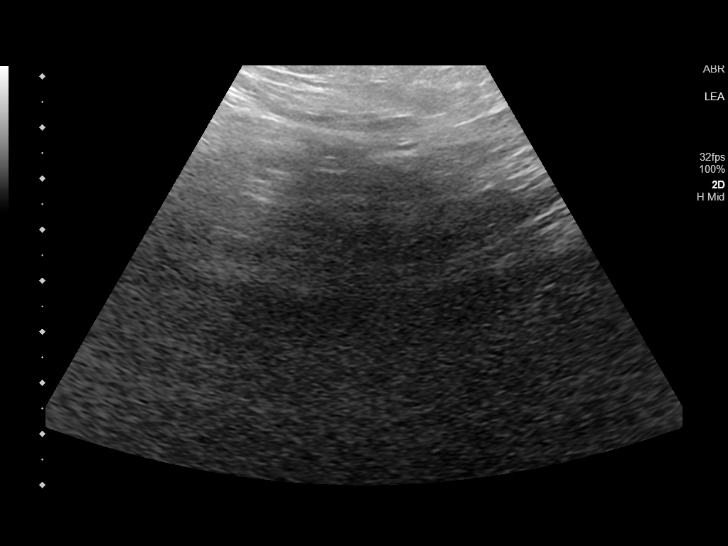
[im 4/20]
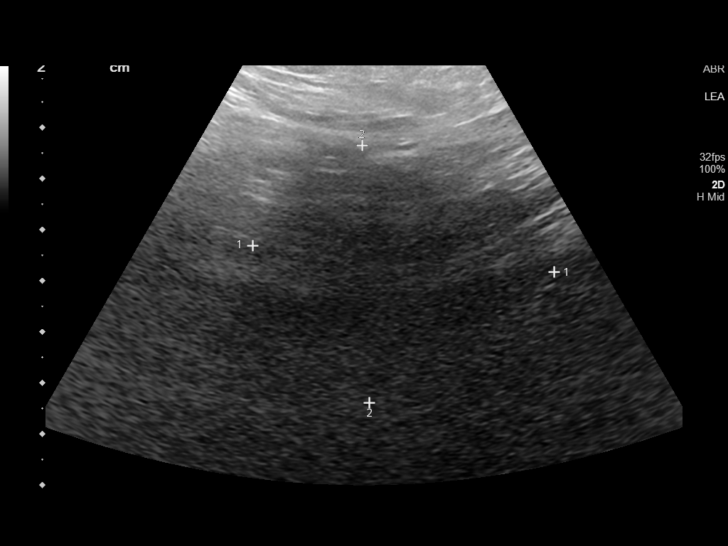
[im 6/20]
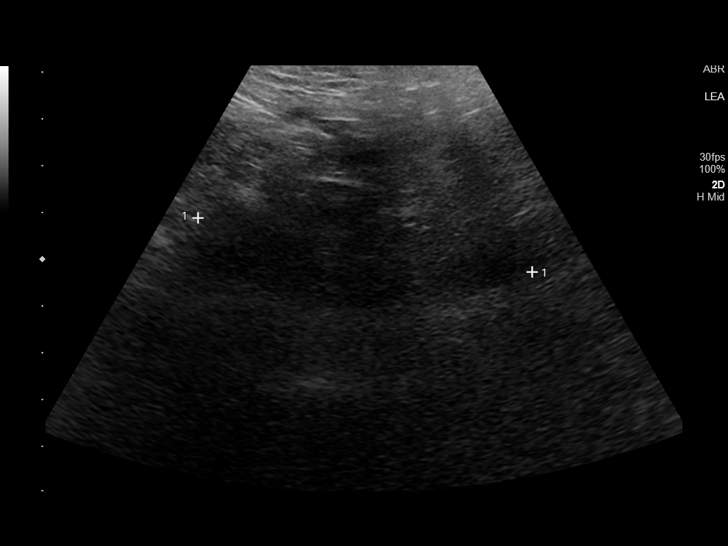
[im 7/20]
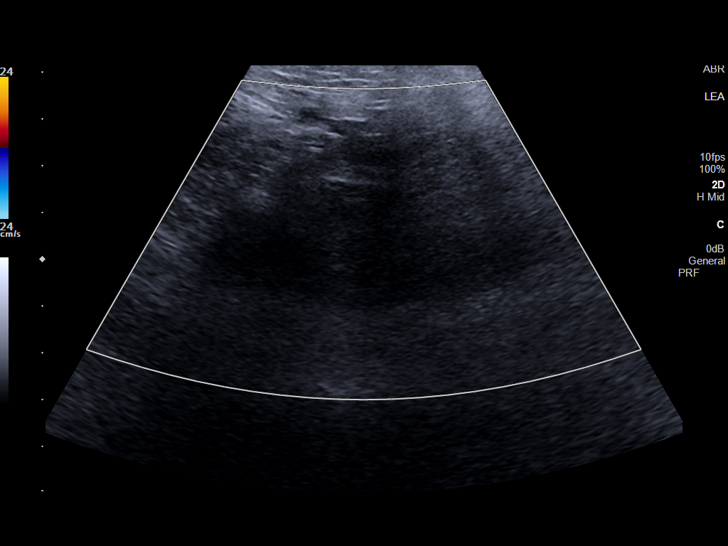
[im 8/20]
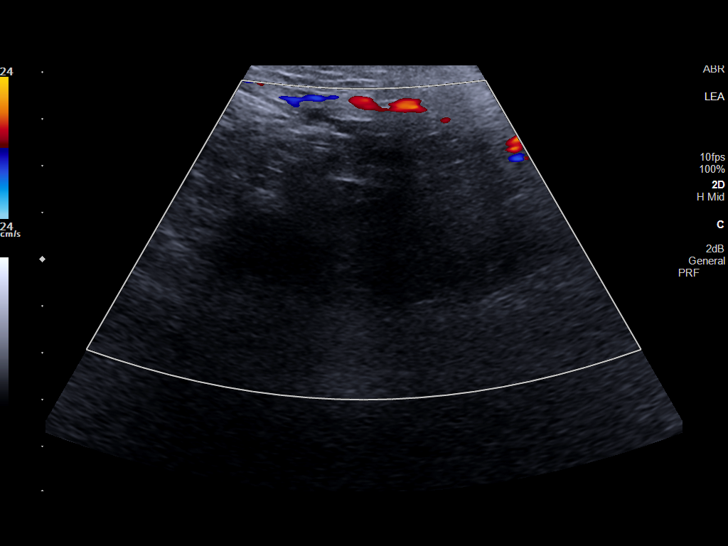
[im 10/20]
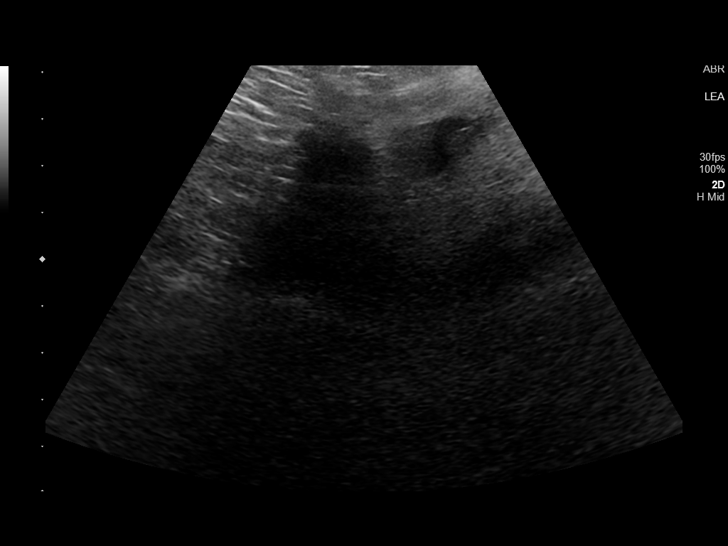
[im 11/20]
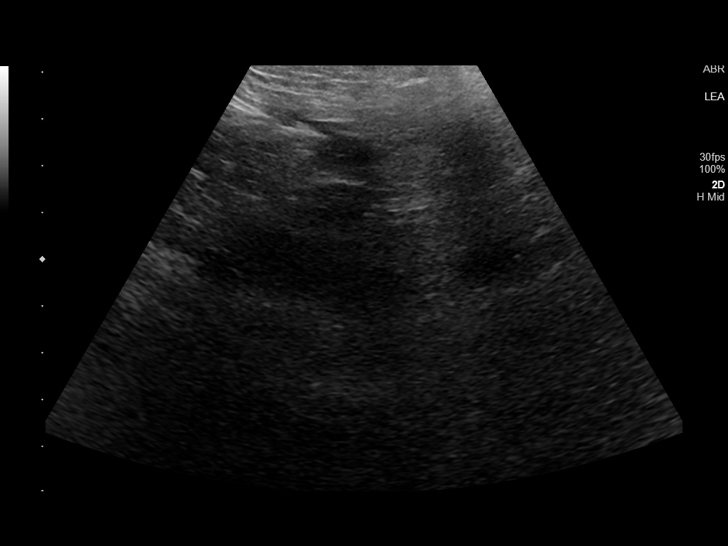
[im 13/20]
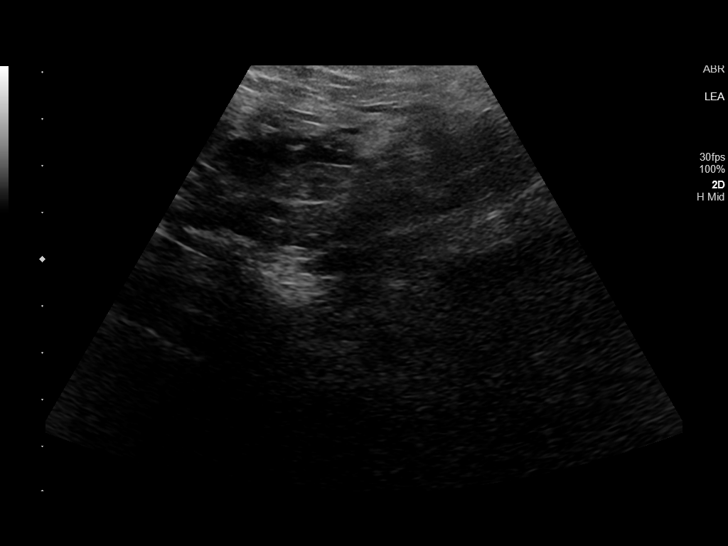
[im 14/20]
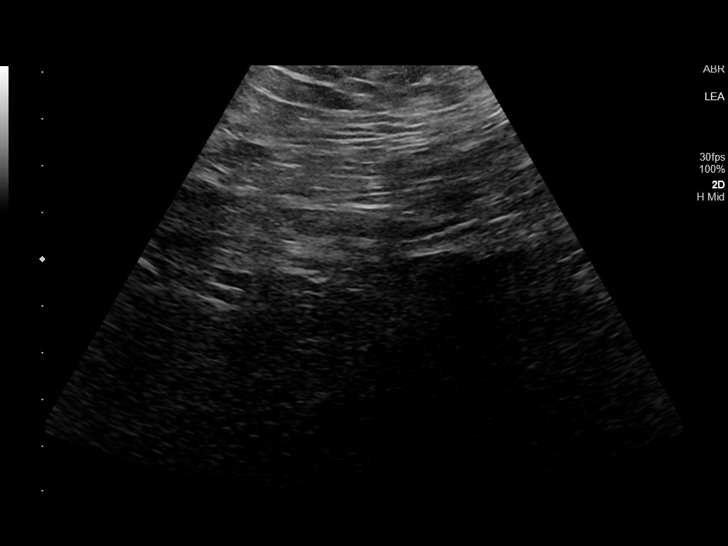
[im 16/20]
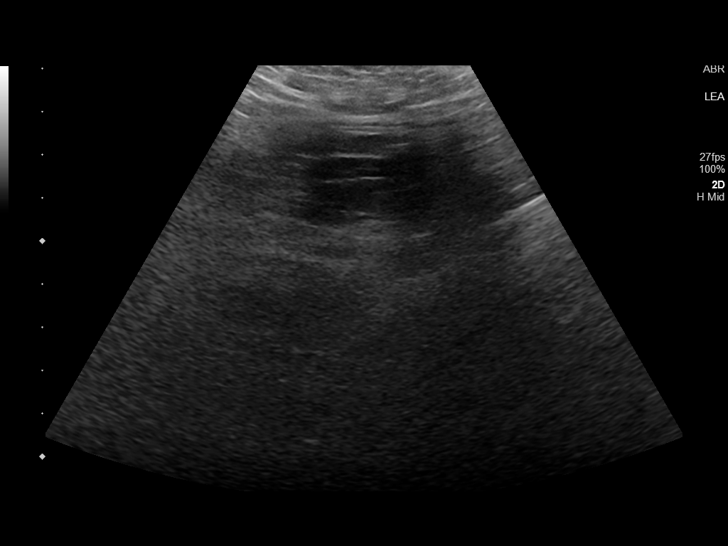
[im 17/20]
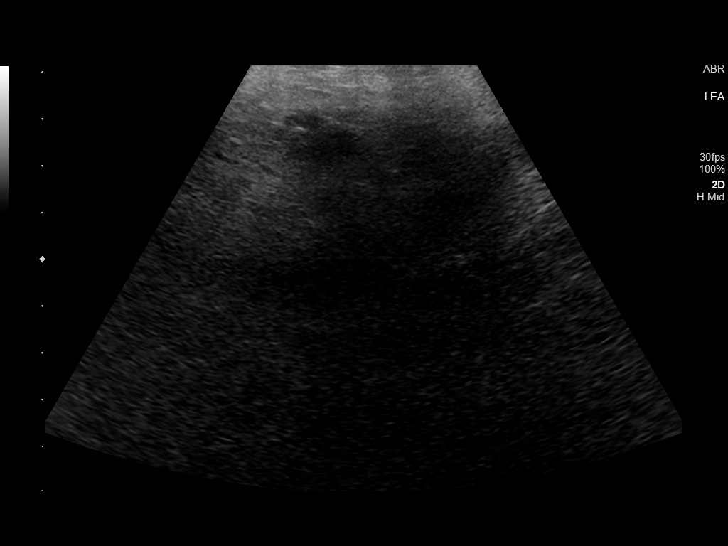
[im 18/20]
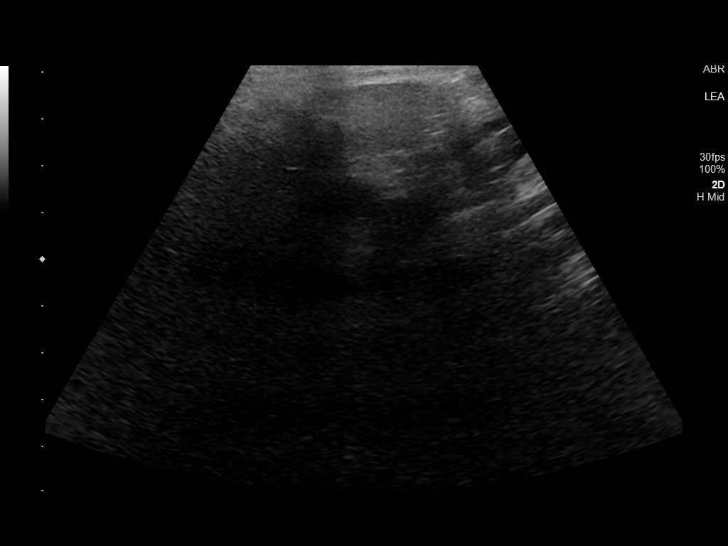
[im 20/20]
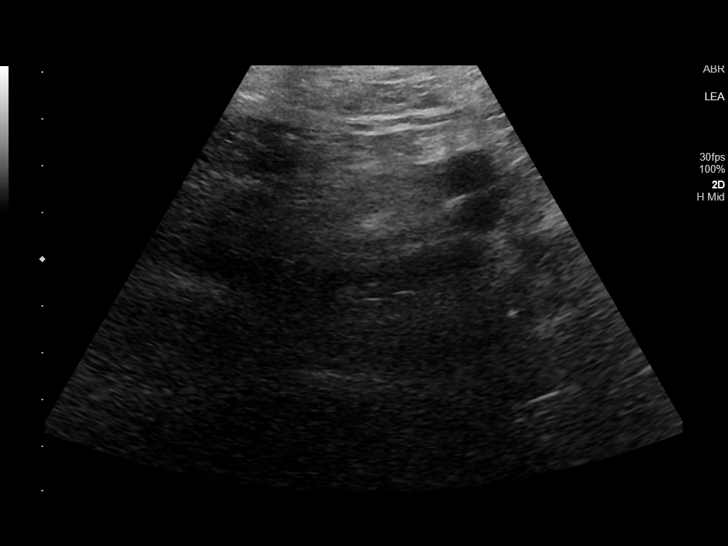

[14 of 20 positions shown; findings below may reference images not displayed]

FINDINGS: Targeted ultrasound of the right groin performed in the region of
previously demonstrated groin hematoma. Heterogeneous hypo and
hyperechoic mass in the right groin, this measures 5.9 x 5 x 7.2 cm,
previous measurements of 6.2 x 4.5 x 7.2 cm. This is grossly stable
in size
IMPRESSION: Grossly stable 7.2 cm right groin hematoma.

## 2020-10-26 IMAGING — CT CT L SPINE W/O CM
3 series · 14 of 33 positions shown, 17 images · IV contrast (APPLIED)
Comparison: Correlation made with CT abdomen [DATE]

CLINICAL DATA: Fall

EXAM:
CT Lumbar spine with contrast
TECHNIQUE: Multiplanar CT images of the lumbar spine were reconstructed from
contemporary CT of the abdomen and pelvis
CONTRAST:  No additional

[Series 3: ax 2 x 2 soft tissue l-spine · axial · 0.35mm/px · z∈[-704,-482]mm · 6 of 146 slices shown, 8 images]
[im 23/146  soft-tissue]
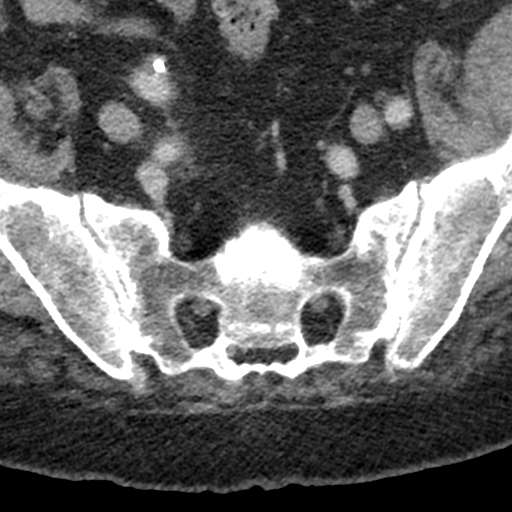
[im 23/146  bone]
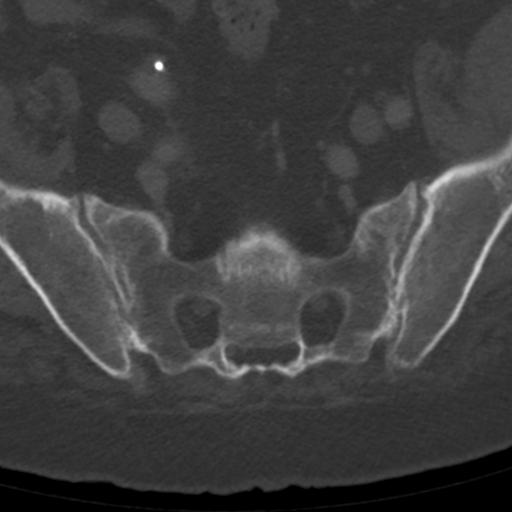
[im 45/146  bone]
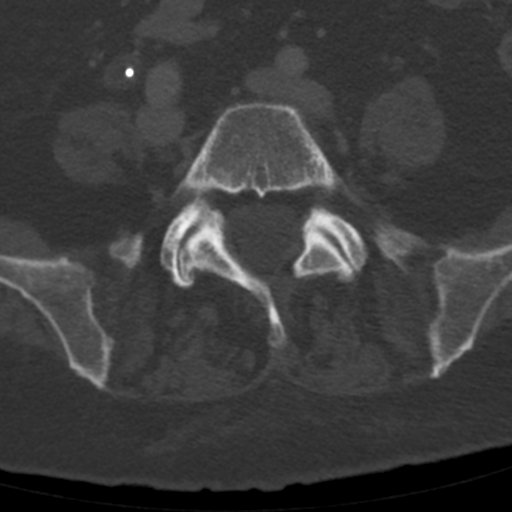
[im 67/146  bone]
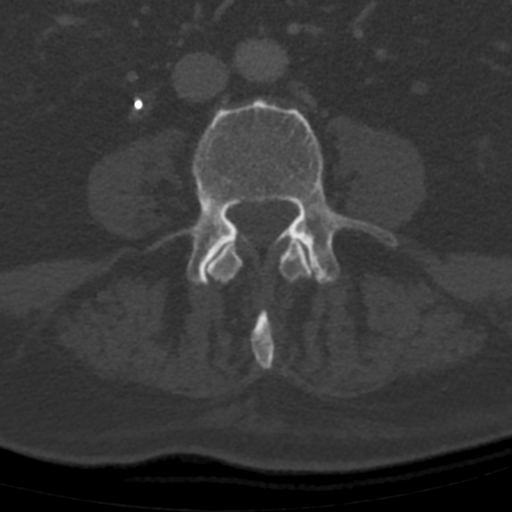
[im 90/146  bone]
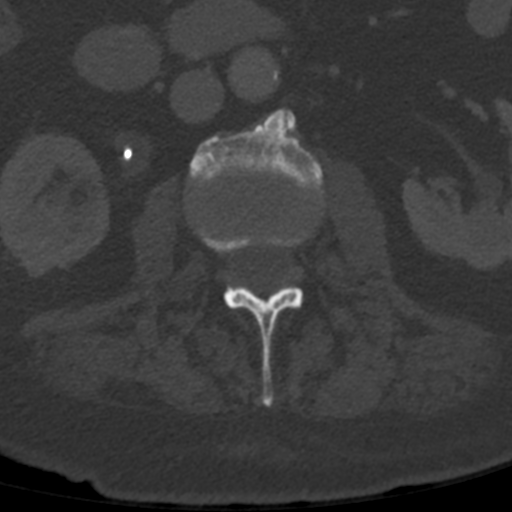
[im 112/146  soft-tissue]
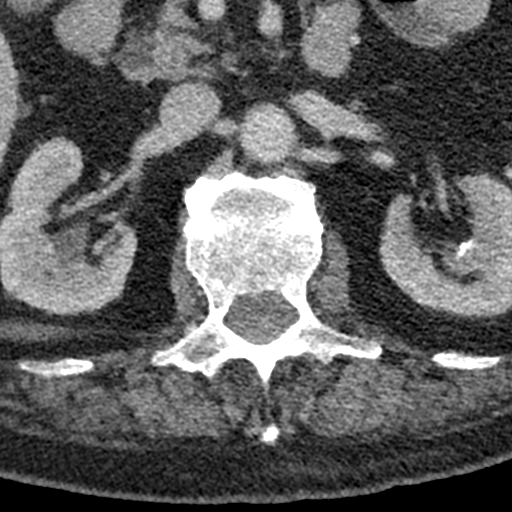
[im 112/146  bone]
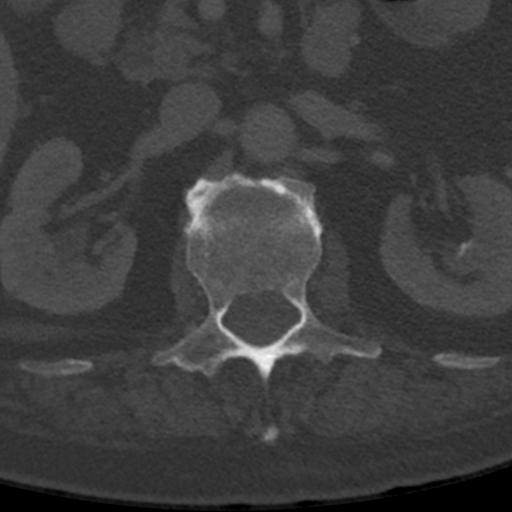
[im 134/146  bone]
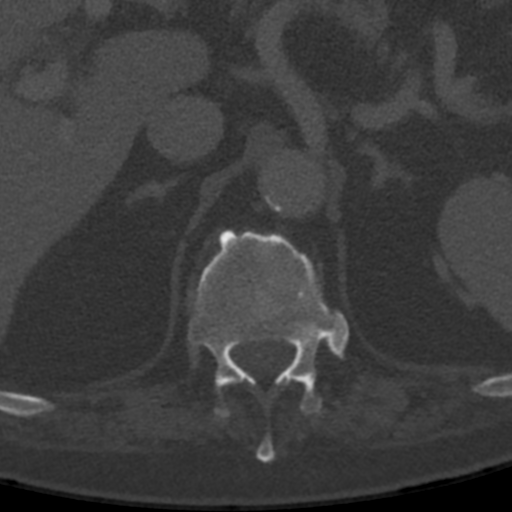

[Series 5: cor bone l-spine · coronal · 0.43mm/px · 3 of 88 slices shown]
[im 18/88  bone]
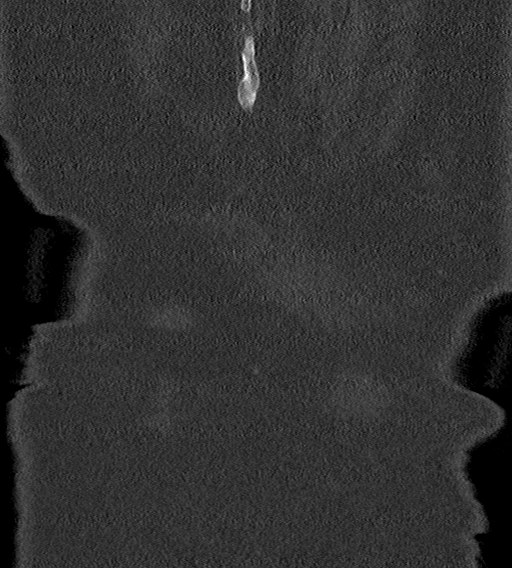
[im 35/88  bone]
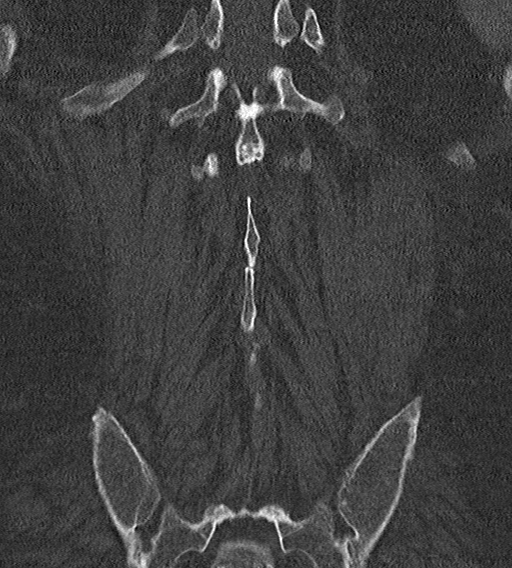
[im 53/88  bone]
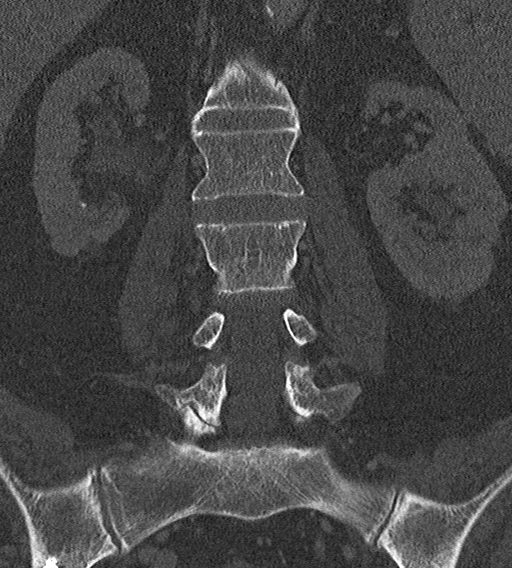

[Series 6: sag bone l-spine · sagittal · 0.37mm/px · 5 of 102 slices shown, 6 images]
[im 34/102  bone]
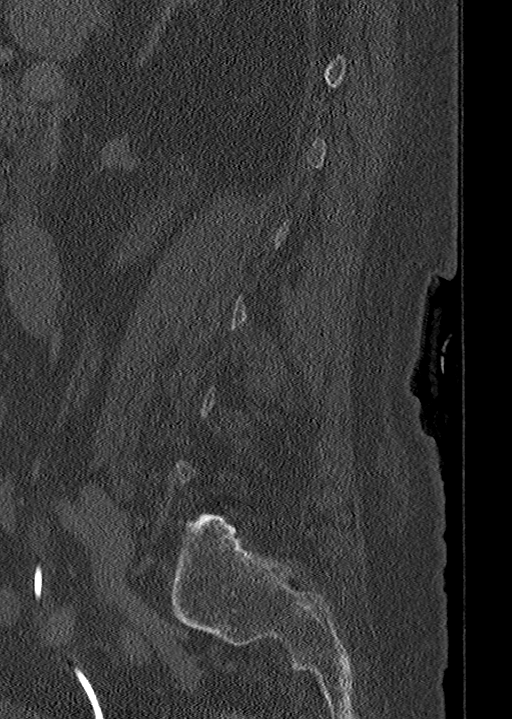
[im 43/102  bone]
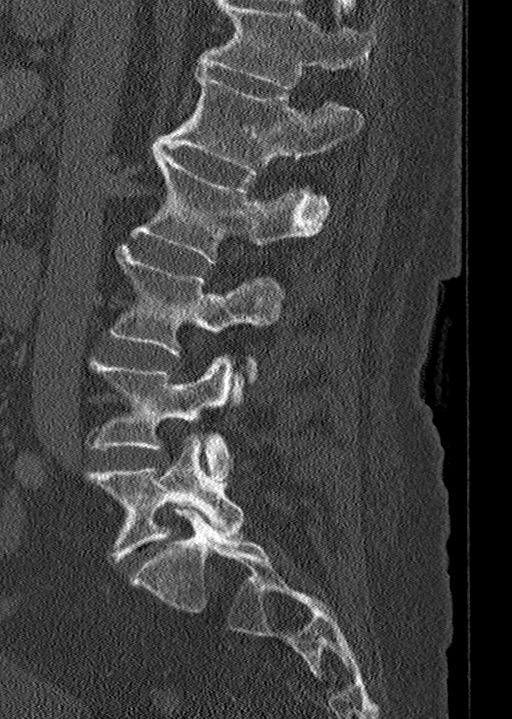
[im 51/102  soft-tissue]
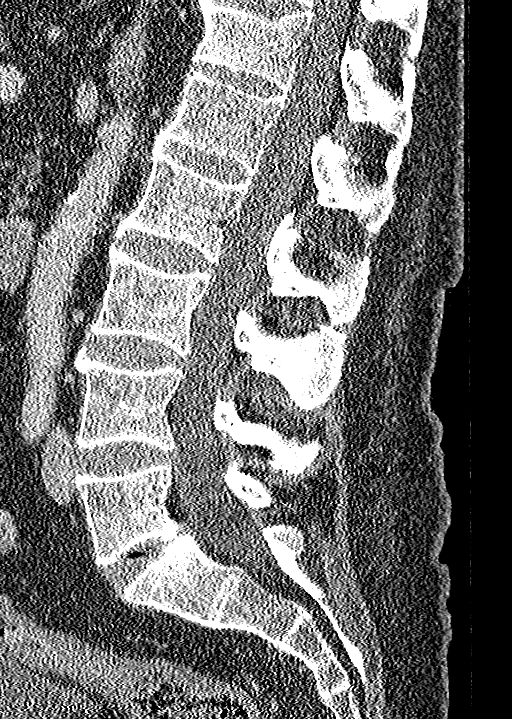
[im 51/102  bone]
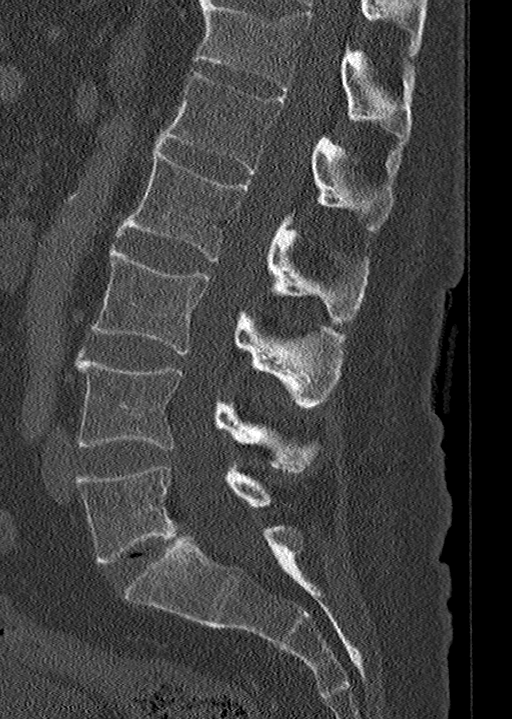
[im 59/102  bone]
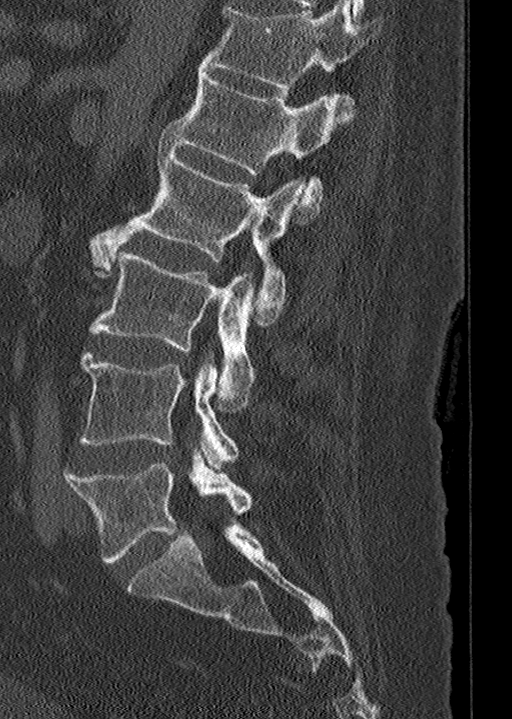
[im 68/102  bone]
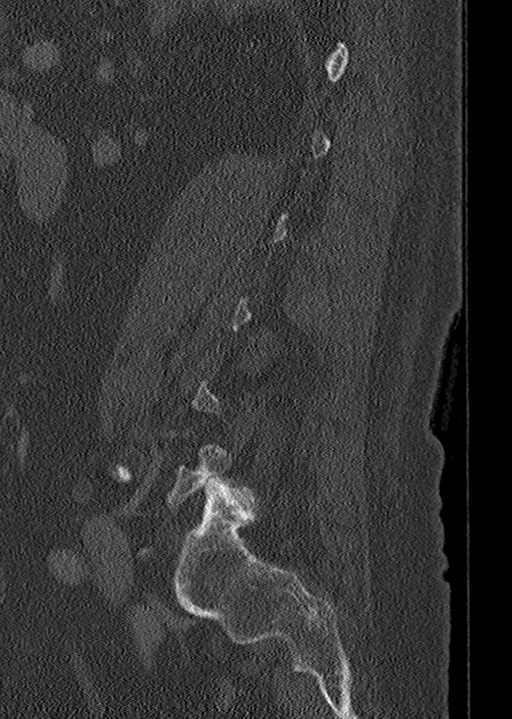

[14 of 33 positions shown; findings below may reference images not displayed]

FINDINGS: Segmentation: 5 lumbar type vertebrae.

Alignment: Preserved.

Vertebrae: Stable lumbar vertebral body heights. Chronic T12
compression fracture. There is no acute fracture.

Paraspinal and other soft tissues: Extra-spinal findings are better
evaluated on concurrent dedicated imaging. There is no paraspinal
hematoma. Right total hip arthroplasty with associated streak
artifact.

Disc levels: Mild multilevel degenerative changes are present,
greatest at L5-S1. No high-grade osseous encroachment on the spinal
canal.
IMPRESSION: No acute lumbar spine fracture.

## 2020-10-26 IMAGING — CT CT CERVICAL SPINE W/O CM
3 of 4 series · 9 of 33 positions shown, 11 images · non-contrast
Comparison: Brain MRI [DATE]

CLINICAL DATA: Minor head trauma

EXAM:
CT HEAD WITHOUT CONTRAST
CT CERVICAL SPINE WITHOUT CONTRAST
TECHNIQUE: Multidetector CT imaging of the head and cervical spine was
performed following the standard protocol without intravenous
contrast. Multiplanar CT image reconstructions of the cervical spine
were also generated.

[Series 6: sagittal bone · sagittal · 0.30mm/px · 5 of 61 slices shown, 6 images]
[im 21/61  bone]
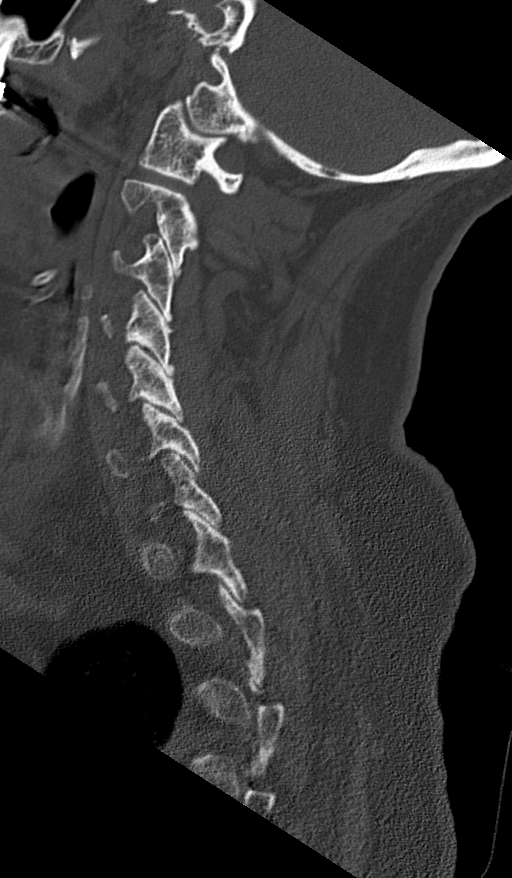
[im 26/61  bone]
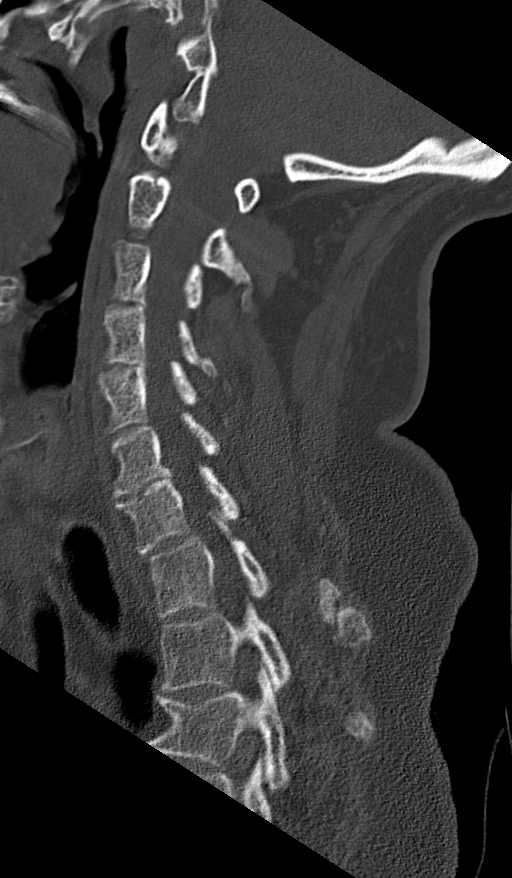
[im 31/61  soft-tissue]
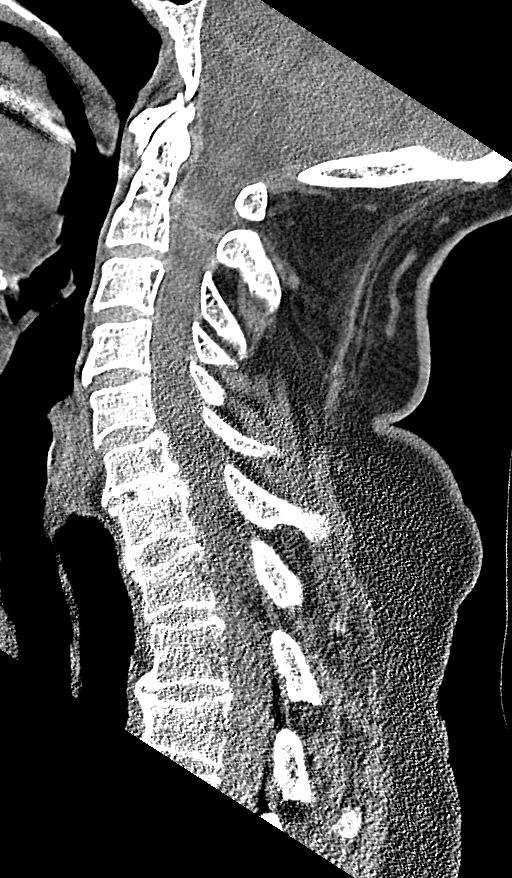
[im 31/61  bone]
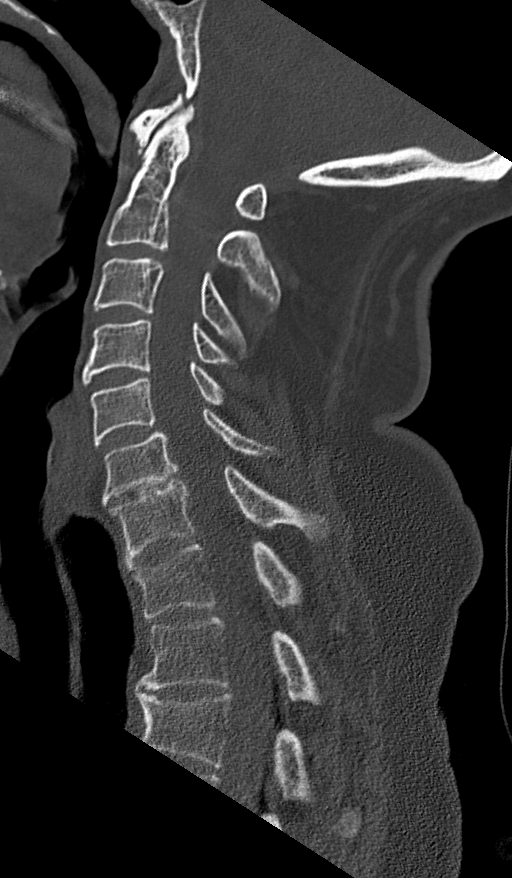
[im 36/61  bone]
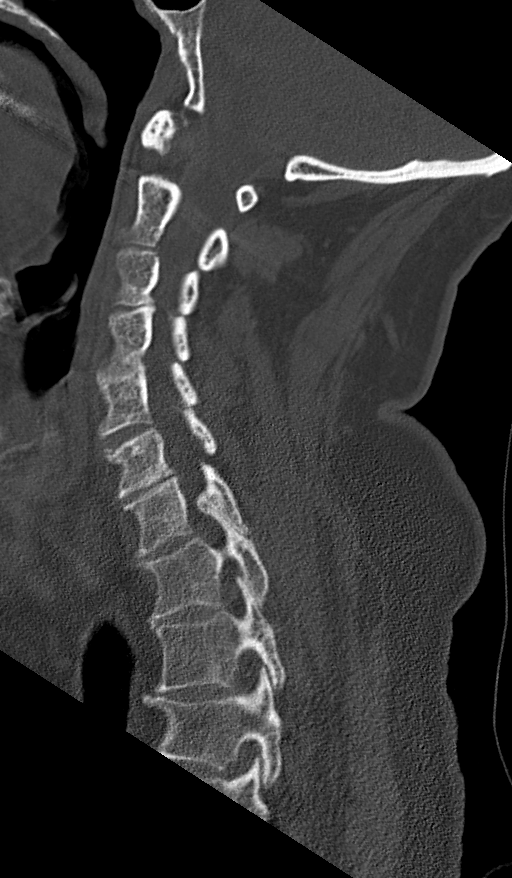
[im 41/61  bone]
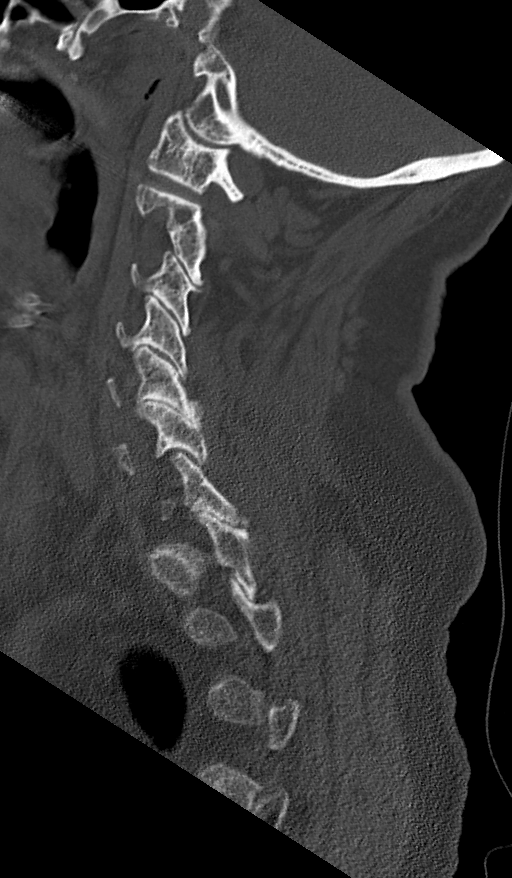

[Series 7: coronal bone · coronal · 0.23mm/px · 3 of 78 slices shown]
[im 26/78  bone]
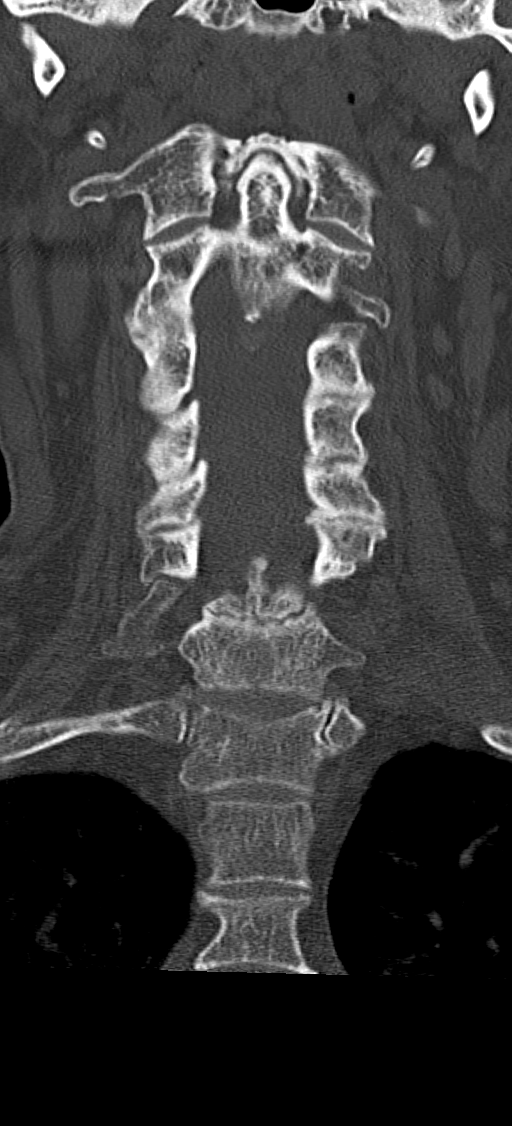
[im 35/78  bone]
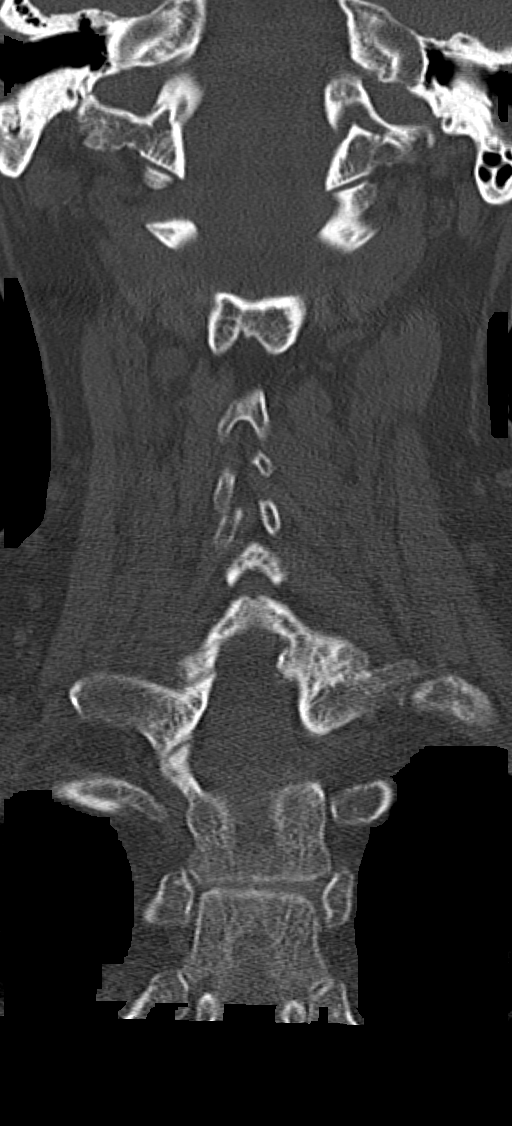
[im 44/78  bone]
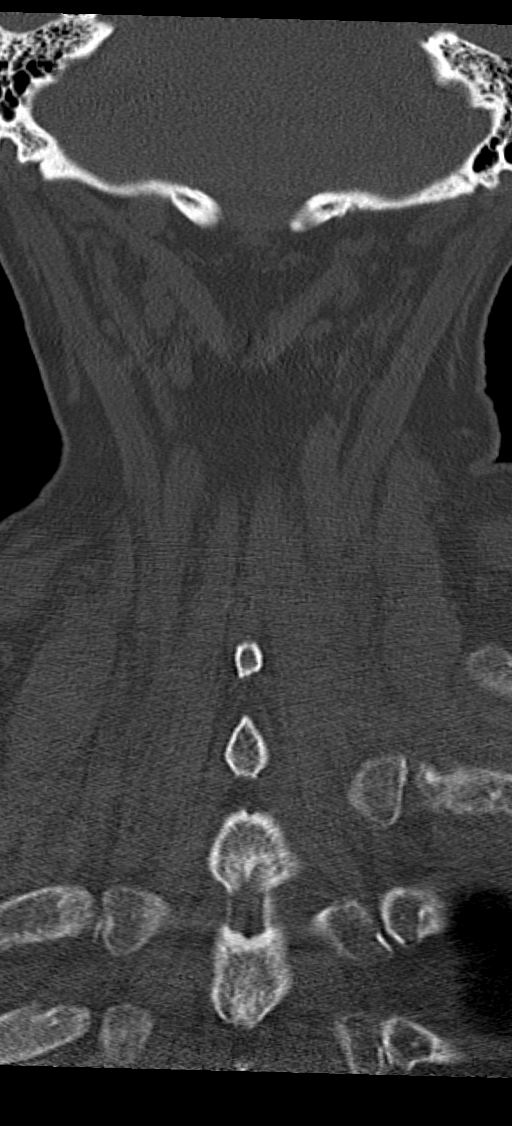

[Series 8: orthogonal bone · axial · 0.23mm/px · z∈[-195,-195]mm · 1 of 133 slices shown, 2 images]
[im 76/133  soft-tissue]
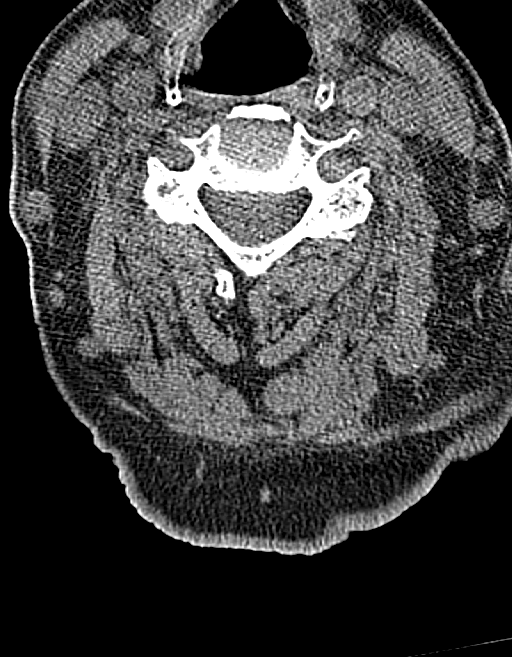
[im 76/133  bone]
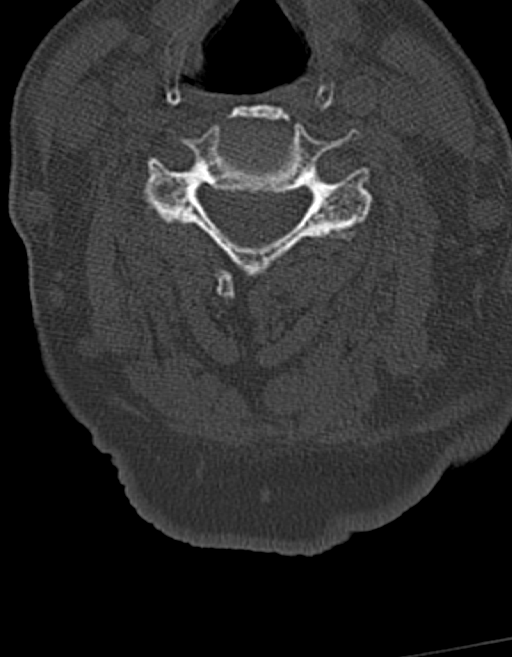

[9 of 33 positions shown; findings below may reference images not displayed]

FINDINGS: CT HEAD FINDINGS

Brain: No evidence of acute infarction, hemorrhage, hydrocephalus,
extra-axial collection or mass lesion/mass effect. Generalized
atrophy

Vascular: No hyperdense vessel or unexpected calcification.

Skull: Normal. Negative for fracture or focal lesion.

Sinuses/Orbits: No acute finding.

CT CERVICAL SPINE FINDINGS

Alignment: No traumatic malalignment

Skull base and vertebrae: No acute fracture. Remote T1 spinous
process fracture.

Soft tissues and spinal canal: No prevertebral fluid or swelling. No
visible canal hematoma.

Disc levels: Ordinary disc and facet degeneration which is
generalized.

Upper chest: Reported separately
IMPRESSION: No evidence of intracranial or cervical spine injury.

## 2020-10-26 IMAGING — DX DG CHEST 1V PORT
1 series · 2 of 2 positions shown · non-contrast
Comparison: [DATE]

CLINICAL DATA: Shortness of breath fell in bathroom

EXAM:
PORTABLE CHEST 1 VIEW

[Series 1: chest ap · 0.14mm/px · 2 of 2 slices shown]
[im 1/2]
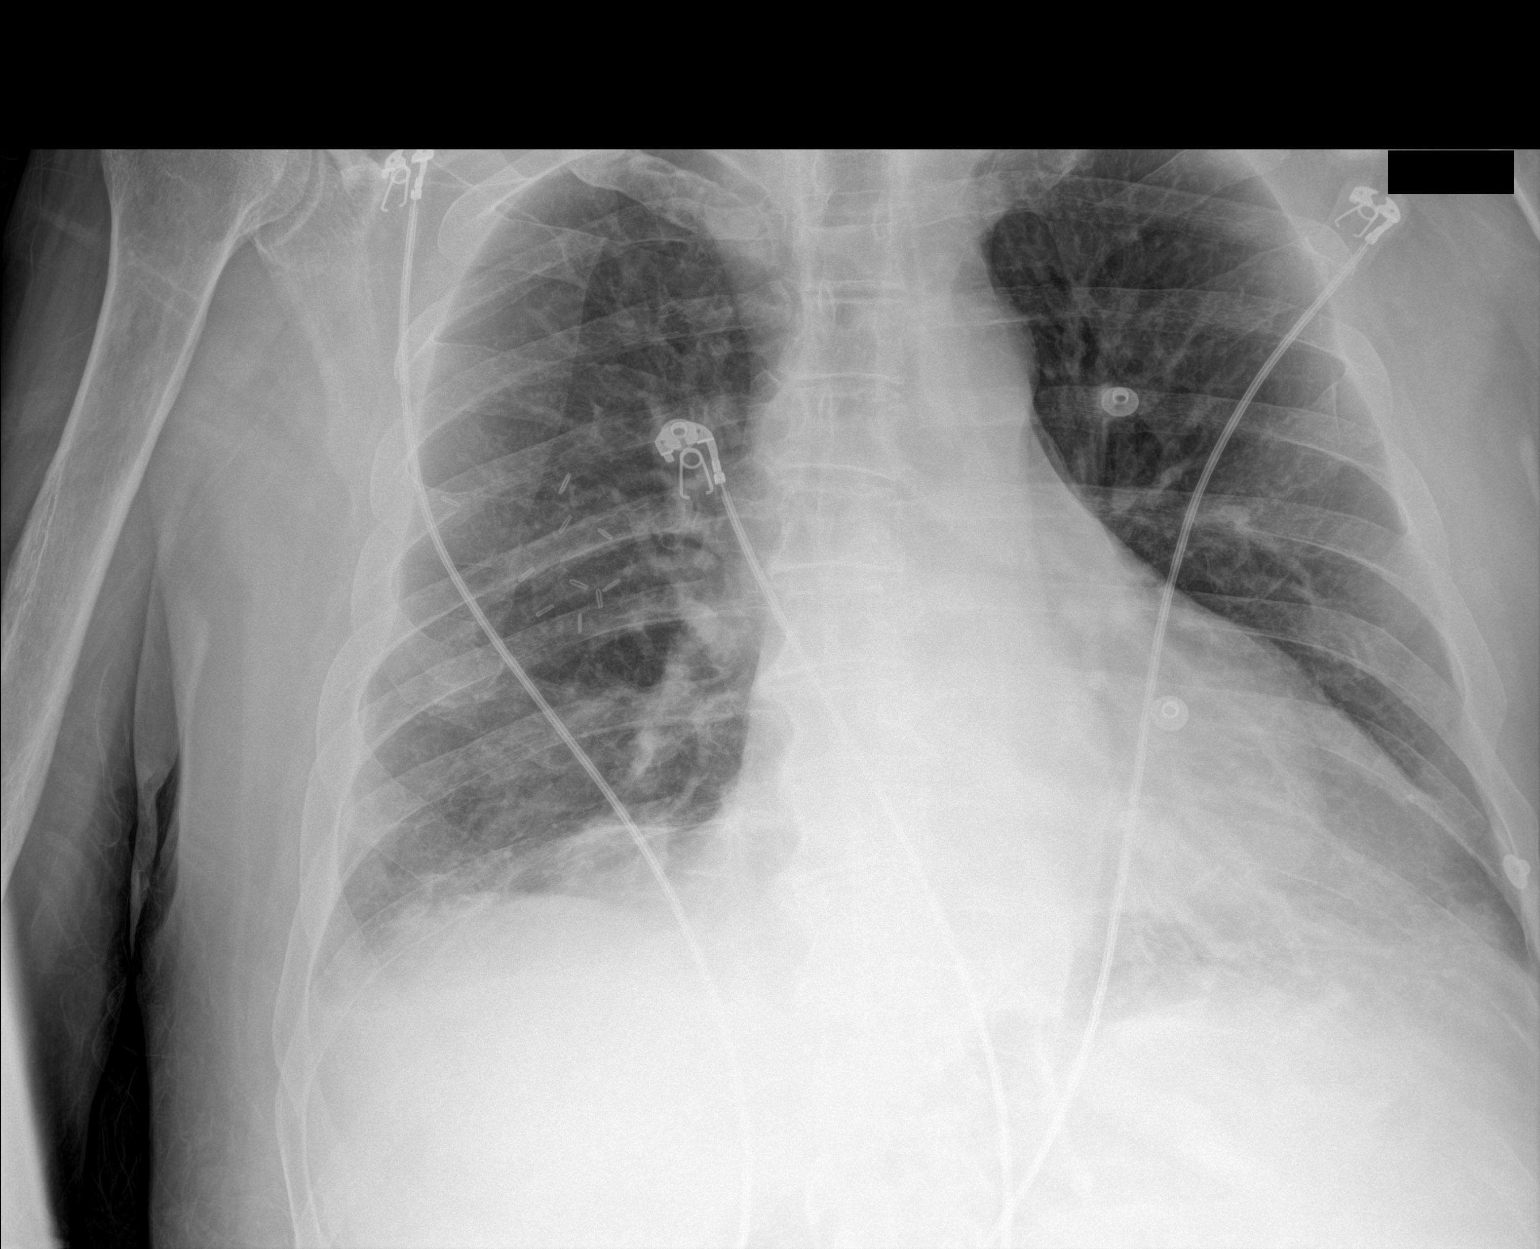
[im 2/2]
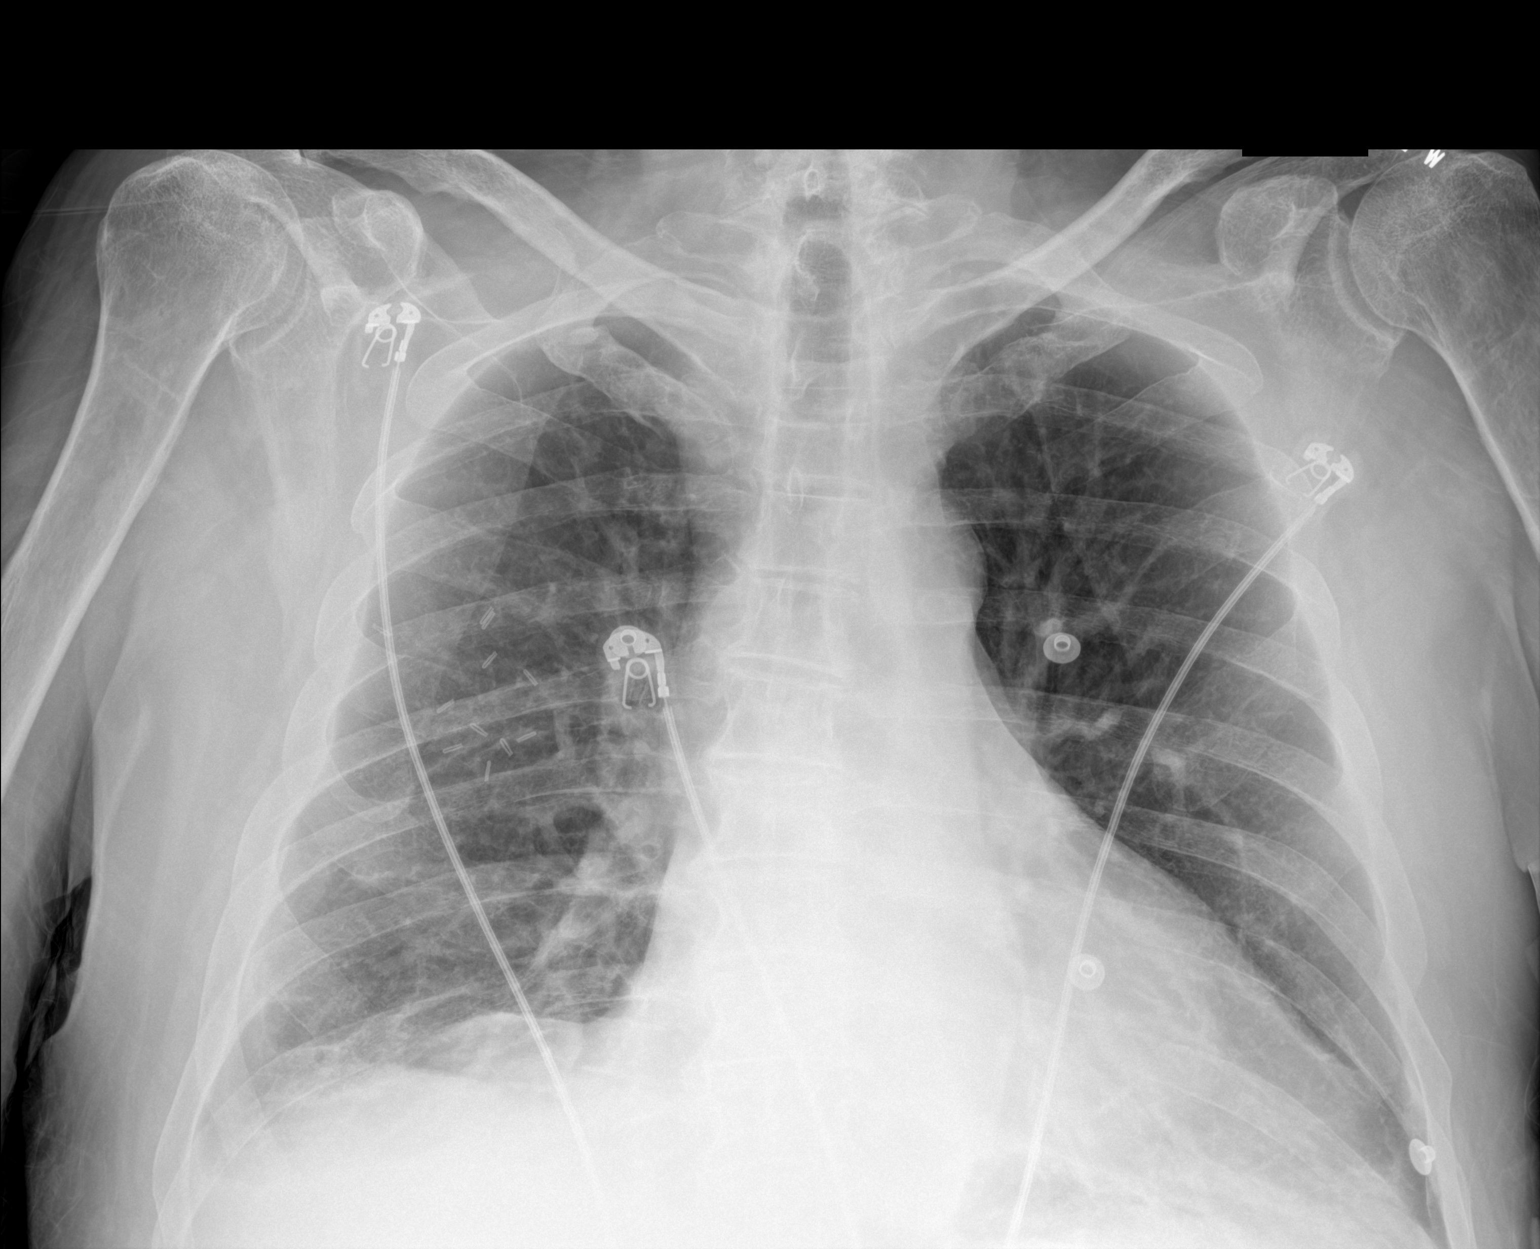

[2 of 2 positions shown; findings below may reference images not displayed]

FINDINGS: Clips over the right chest. Stable cardiomediastinal silhouette.
Decreased vascular congestion compared to prior. Streaky atelectasis
at the bases. No pneumothorax.
IMPRESSION: Streaky atelectasis at the bases. Decreased vascular congestion
compared to prior.

## 2020-10-26 IMAGING — US US EXTREM LOW DUPLEX ARTERIAL*R* LIMITED
1 series · 14 of 20 positions shown · non-contrast
Comparison: None.

CLINICAL DATA: Right groin pain and swelling. Recent heart
catheterization.

EXAM:
RIGHT LOWER EXTREMITY ARTERIAL DUPLEX - LIMITED
TECHNIQUE: Imaging was performed at the area of concern in the right groin.

[Series 1: us lower ext art right ltd · 14 of 20 slices shown]
[im 1/20]
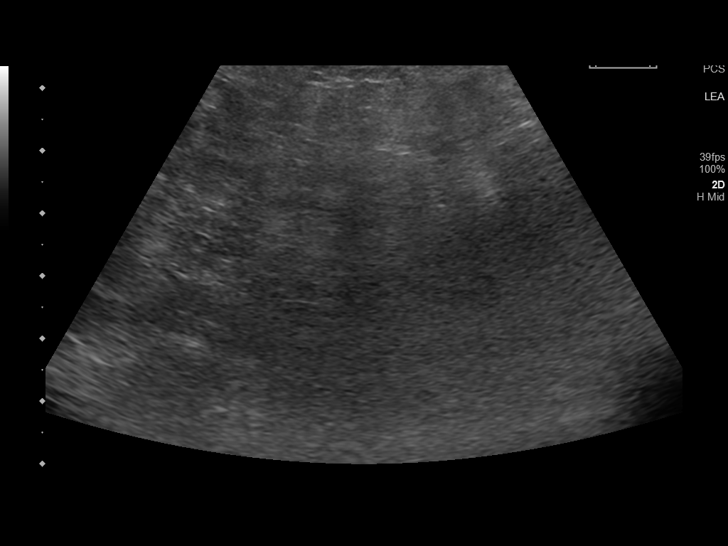
[im 3/20]
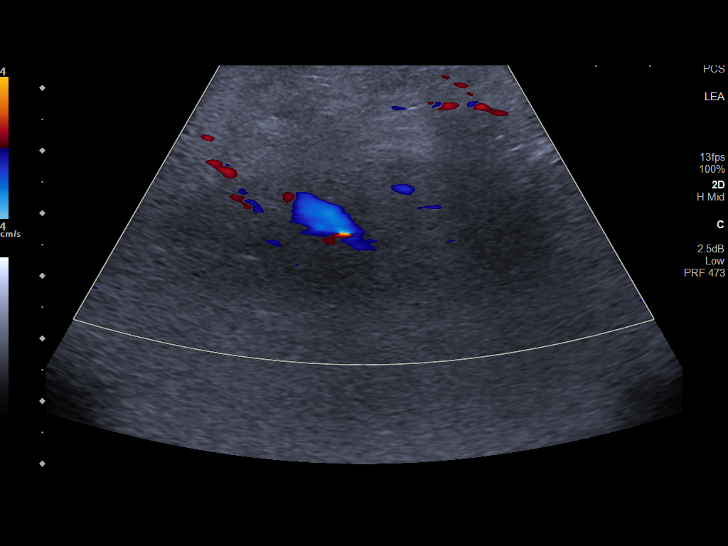
[im 4/20]
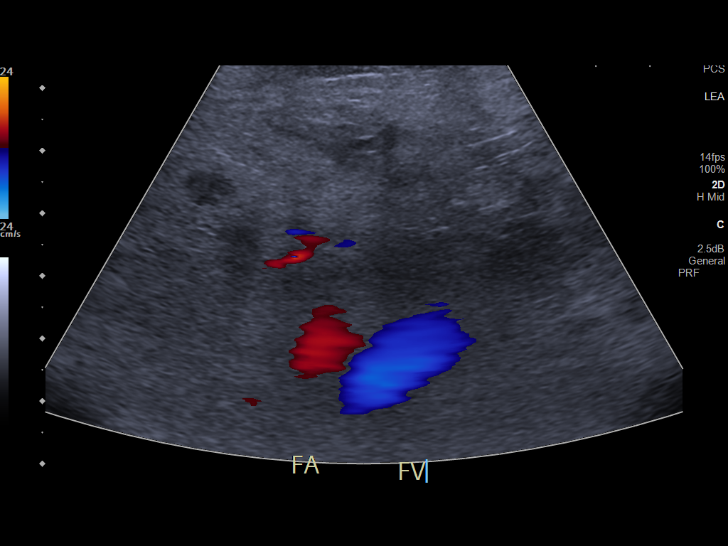
[im 6/20]
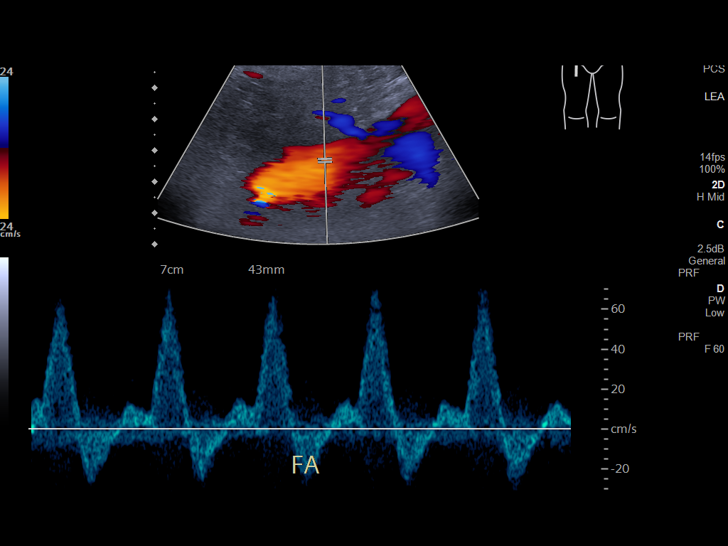
[im 7/20]
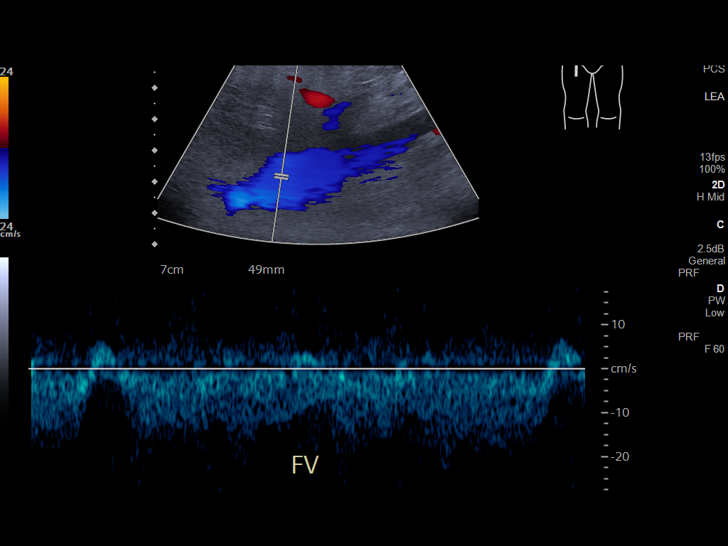
[im 8/20]
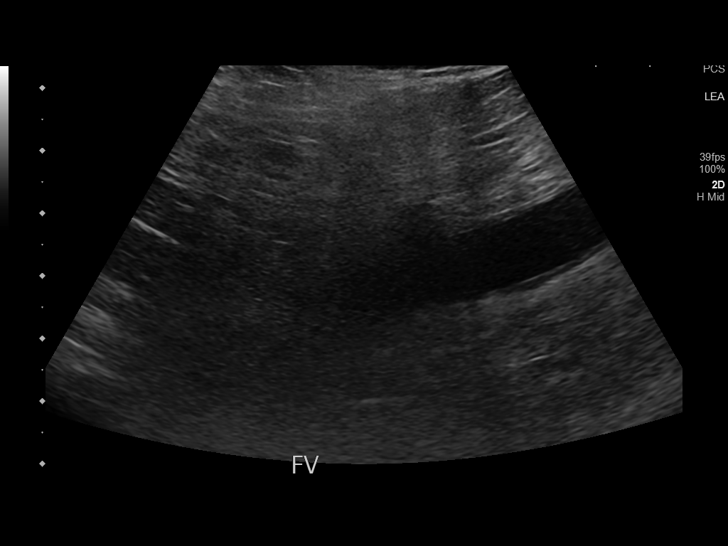
[im 10/20]
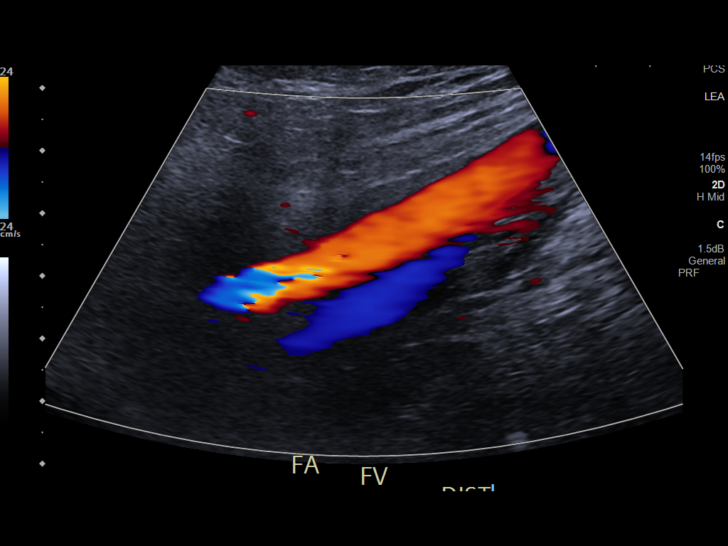
[im 11/20]
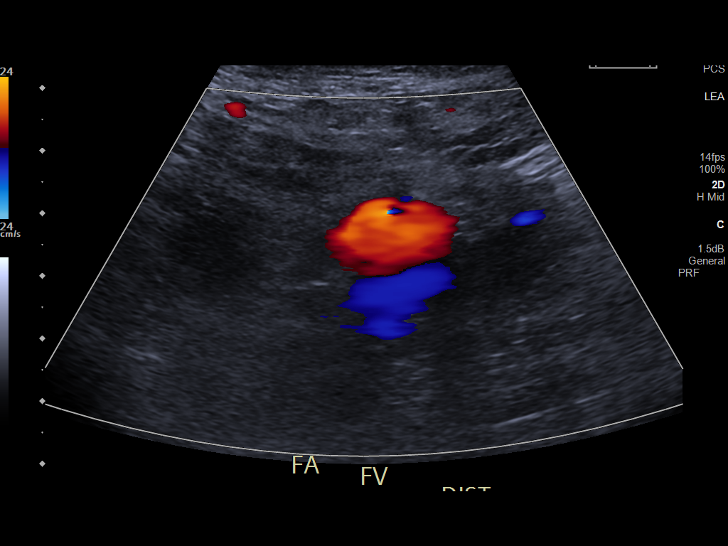
[im 13/20]
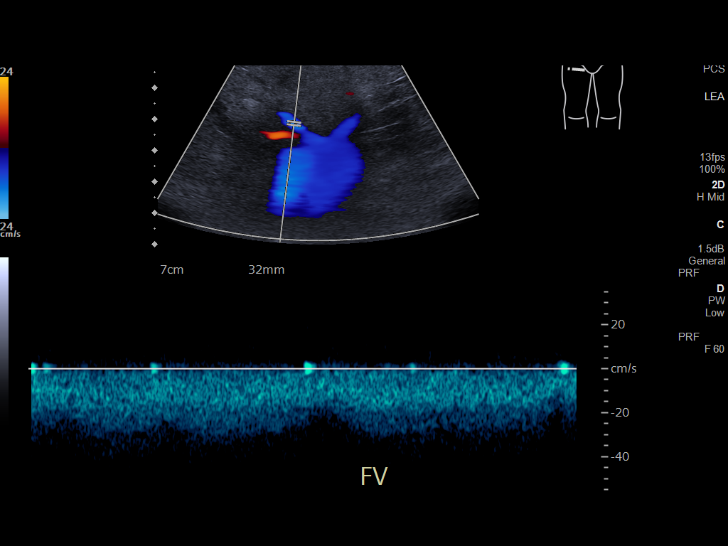
[im 14/20]
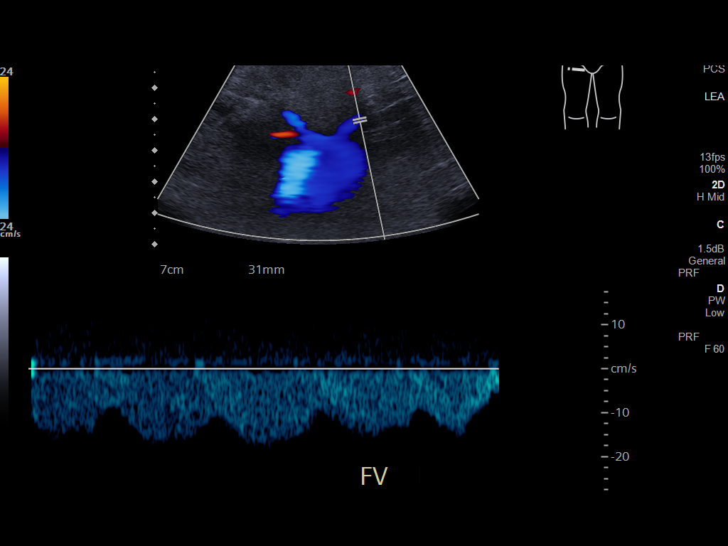
[im 16/20]
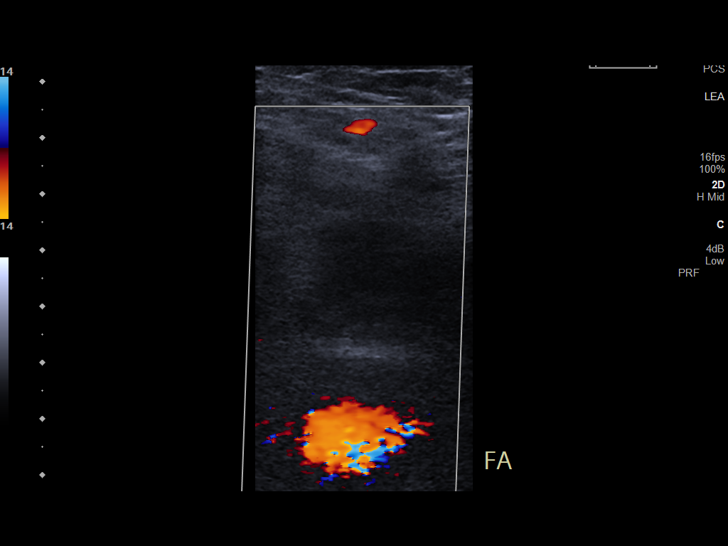
[im 17/20]
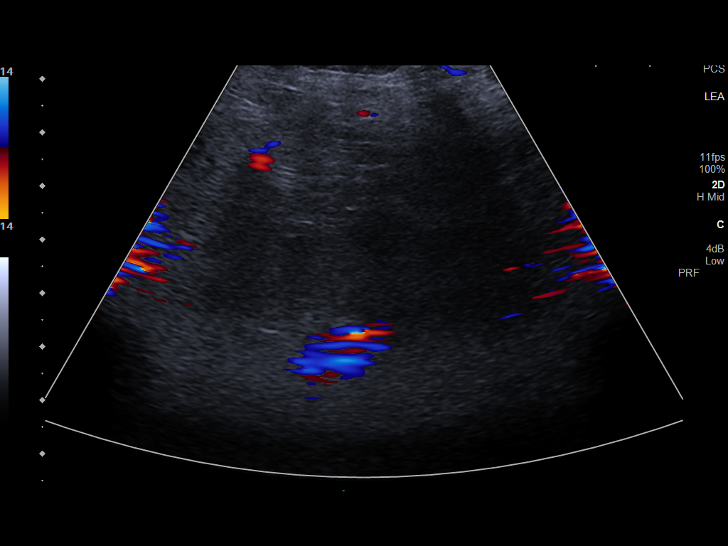
[im 18/20]
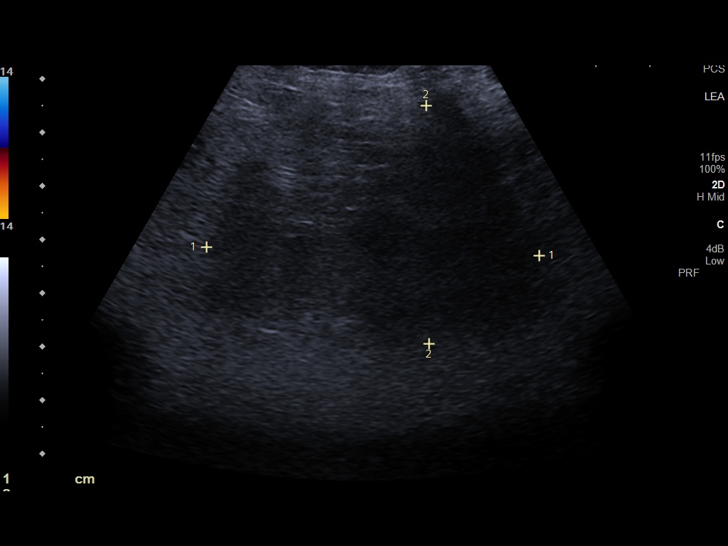
[im 20/20]
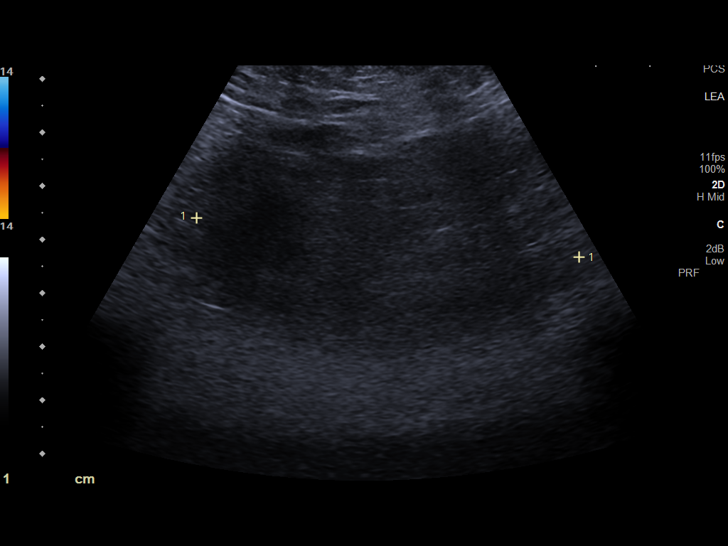

[14 of 20 positions shown; findings below may reference images not displayed]

FINDINGS: Images were obtained over the right groin. Normal triphasic Doppler
flow in the right common femoral artery. There is color Doppler flow
in the adjacent common femoral vein. There is no evidence to suggest
a pseudoaneurysm or AV fistula. There is a heterogeneous hypoechoic
structure in the right groin that is compatible with hematoma that
measures 6.2 x 4.5 x 7.2 cm. There is no definite arterial flow
within this heterogeneous collection.
IMPRESSION: 1. Right groin hematoma measuring up to 7.2 cm.
2. No evidence for a pseudoaneurysm or AV fistula in the right
groin.

## 2020-10-26 MED ORDER — GADOBUTROL 1 MMOL/ML IV SOLN
8.0000 mL | Freq: Once | INTRAVENOUS | Status: AC | PRN
  Administered 2020-10-26: 8 mL via INTRAVENOUS

## 2020-10-26 MED ORDER — SODIUM CHLORIDE 0.9 % IV SOLN
10.0000 mL/h | Freq: Once | INTRAVENOUS | Status: AC
  Administered 2020-10-26: 10 mL/h via INTRAVENOUS

## 2020-10-26 MED ORDER — LEVOTHYROXINE SODIUM 100 MCG PO TABS
100.0000 ug | ORAL_TABLET | Freq: Every day | ORAL | Status: DC
  Administered 2020-10-27 – 2020-11-04 (×9): 100 ug via ORAL
  Filled 2020-10-26 (×9): qty 1

## 2020-10-26 MED ORDER — ACETAMINOPHEN 650 MG RE SUPP: 650.0000 mg | Freq: Four times a day (QID) | RECTAL | Status: AC | PRN

## 2020-10-26 MED ORDER — MORPHINE SULFATE (PF) 2 MG/ML IV SOLN
2.0000 mg | INTRAVENOUS | Status: AC | PRN
  Administered 2020-10-26 – 2020-10-28 (×3): 2 mg via INTRAVENOUS
  Filled 2020-10-26 (×3): qty 1

## 2020-10-26 MED ORDER — SODIUM CHLORIDE 0.9% IV SOLUTION
Freq: Once | INTRAVENOUS | Status: AC
  Filled 2020-10-26: qty 250

## 2020-10-26 MED ORDER — IOHEXOL 300 MG/ML  SOLN
100.0000 mL | Freq: Once | INTRAMUSCULAR | Status: AC | PRN
  Administered 2020-10-26: 100 mL via INTRAVENOUS

## 2020-10-26 MED ORDER — ONDANSETRON HCL 4 MG/2ML IJ SOLN
4.0000 mg | Freq: Four times a day (QID) | INTRAMUSCULAR | Status: DC | PRN
  Administered 2020-10-26: 4 mg via INTRAVENOUS
  Filled 2020-10-26: qty 2

## 2020-10-26 MED ORDER — TIMOLOL MALEATE 0.5 % OP SOLN
1.0000 [drp] | Freq: Two times a day (BID) | OPHTHALMIC | Status: DC
  Administered 2020-10-27 – 2020-11-04 (×16): 1 [drp] via OPHTHALMIC
  Filled 2020-10-26: qty 5

## 2020-10-26 MED ORDER — KETOROLAC TROMETHAMINE 30 MG/ML IJ SOLN
30.0000 mg | Freq: Three times a day (TID) | INTRAMUSCULAR | Status: AC | PRN
  Administered 2020-10-26 – 2020-10-27 (×2): 30 mg via INTRAVENOUS
  Filled 2020-10-26 (×2): qty 1

## 2020-10-26 MED ORDER — POTASSIUM CHLORIDE CRYS ER 20 MEQ PO TBCR
40.0000 meq | EXTENDED_RELEASE_TABLET | Freq: Once | ORAL | Status: AC
  Administered 2020-10-26: 40 meq via ORAL
  Filled 2020-10-26: qty 2

## 2020-10-26 MED ORDER — METOPROLOL TARTRATE 25 MG PO TABS: 25.0000 mg | ORAL_TABLET | Freq: Two times a day (BID) | ORAL | Status: DC

## 2020-10-26 MED ORDER — ATORVASTATIN CALCIUM 20 MG PO TABS
40.0000 mg | ORAL_TABLET | Freq: Every day | ORAL | Status: DC
  Administered 2020-10-27 – 2020-11-03 (×9): 40 mg via ORAL
  Filled 2020-10-26 (×9): qty 2

## 2020-10-26 MED ORDER — ONDANSETRON HCL 4 MG PO TABS: 4.0000 mg | ORAL_TABLET | Freq: Four times a day (QID) | ORAL | Status: DC | PRN

## 2020-10-26 MED ORDER — FINASTERIDE 5 MG PO TABS
5.0000 mg | ORAL_TABLET | Freq: Every day | ORAL | Status: DC
  Administered 2020-10-26 – 2020-11-04 (×10): 5 mg via ORAL
  Filled 2020-10-26 (×10): qty 1

## 2020-10-26 MED ORDER — ACETAMINOPHEN 325 MG PO TABS
650.0000 mg | ORAL_TABLET | Freq: Four times a day (QID) | ORAL | Status: AC | PRN
  Administered 2020-10-26 – 2020-10-29 (×4): 650 mg via ORAL
  Filled 2020-10-26 (×4): qty 2

## 2020-10-26 MED ORDER — TRAZODONE HCL 100 MG PO TABS
100.0000 mg | ORAL_TABLET | Freq: Every day | ORAL | Status: DC
  Administered 2020-10-27 – 2020-11-03 (×9): 100 mg via ORAL
  Filled 2020-10-26 (×9): qty 1

## 2020-10-26 MED ORDER — PILOCARPINE HCL 5 MG PO TABS
5.0000 mg | ORAL_TABLET | Freq: Three times a day (TID) | ORAL | Status: DC
  Administered 2020-10-27 – 2020-11-04 (×27): 5 mg via ORAL
  Filled 2020-10-26 (×30): qty 1

## 2020-10-26 MED ORDER — GABAPENTIN 100 MG PO CAPS
100.0000 mg | ORAL_CAPSULE | Freq: Three times a day (TID) | ORAL | Status: DC
  Administered 2020-10-26 – 2020-11-04 (×27): 100 mg via ORAL
  Filled 2020-10-26 (×27): qty 1

## 2020-10-26 MED ORDER — SODIUM CHLORIDE 0.9 % IV BOLUS
500.0000 mL | Freq: Once | INTRAVENOUS | Status: AC
  Administered 2020-10-26: 500 mL via INTRAVENOUS

## 2020-10-26 MED ORDER — FUROSEMIDE 10 MG/ML IJ SOLN: 20.0000 mg | Freq: Once | INTRAMUSCULAR | Status: DC

## 2020-10-26 MED ORDER — FUROSEMIDE 10 MG/ML IJ SOLN: 20.0000 mg | Freq: Every day | INTRAMUSCULAR | Status: DC | PRN

## 2020-10-26 MED ORDER — PANTOPRAZOLE SODIUM 40 MG PO TBEC
40.0000 mg | DELAYED_RELEASE_TABLET | Freq: Every day | ORAL | Status: DC
  Administered 2020-10-27 – 2020-11-04 (×9): 40 mg via ORAL
  Filled 2020-10-26 (×9): qty 1

## 2020-10-26 MED ORDER — VITAMIN B-6 50 MG PO TABS
100.0000 mg | ORAL_TABLET | Freq: Every day | ORAL | Status: DC
  Administered 2020-10-27 – 2020-11-04 (×9): 100 mg via ORAL
  Filled 2020-10-26 (×9): qty 2

## 2020-10-26 MED ORDER — SERTRALINE HCL 50 MG PO TABS
100.0000 mg | ORAL_TABLET | Freq: Every day | ORAL | Status: DC
  Administered 2020-10-27 – 2020-11-04 (×9): 100 mg via ORAL
  Filled 2020-10-26 (×9): qty 2

## 2020-10-26 MED ORDER — HYDROMORPHONE HCL 1 MG/ML IJ SOLN
0.5000 mg | Freq: Once | INTRAMUSCULAR | Status: AC
  Administered 2020-10-26: 0.5 mg via INTRAVENOUS
  Filled 2020-10-26: qty 1

## 2020-10-26 NOTE — ED Triage Notes (Signed)
Pt from home with c/o falling in bathroom. Per pt, he was walking to bathroom and lost his balance and fell. Pt denies hitting his head or LOC. Pt c/o of mid back pain.

## 2020-10-26 NOTE — ED Notes (Signed)
US at bedside

## 2020-10-26 NOTE — ED Notes (Signed)
Pt given phone to speak with MRI

## 2020-10-26 NOTE — ED Notes (Signed)
Pt transported to CT ?

## 2020-10-26 NOTE — ED Notes (Addendum)
Pt's wife Kaicen Desena would like to know when patient is placed in room: [phone]

## 2020-10-26 NOTE — ED Notes (Signed)
ED Provider at bedside. 

## 2020-10-26 NOTE — H&P (Addendum)
History and Physical   Gordon Farrell EHU:[id number] DOB: 1945-10-05 DOA: 10/26/2020  PCP: Danae Orleans, MD  Outpatient Specialists: Dr. Freddi Che, [facility] hematology and oncology Patient coming from: Home  I have personally briefly reviewed patient's old medical records in [place].  Chief Concern: [place]  HPI: Gordon Farrell is a 75 y.o. male with medical history significant for atrial fibrillation, history of DVT, on Eliquis, hyperlipidemia, hypothyroid, BPH, pancytopenia, MGUS, chronic right upper back pain, presents to the emergency department for chief concerns of fall.  He reports that last evening his legs became so weak and wobbly.  He endorses however that he did not fall last evening.  He fell in the a.m. on day of admission.  He denies light head trauma and loss of consciousness.  He denies seizure activity. He reports he fell in the hospital.   At bedside, he was able to tell me his name, age, location of hospital.  He knew the current calendar year.  He reports that his last bowel movement was prior to admission and it was loose and watery and brown in color.  He denies melena, bright red blood per rectum, nausea or vomiting of any kind.  Social history: Lives at home with his wife.  Patient denies history of tobacco use, EtOH, recreational drug use.  He is currently retired and formerly was in the [employer].  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  Upon leaving the [employer], he participated in many jobs including working for [employer], was a Engineer, structural.  Vaccination history: He endorses being vaccinated for COVID-19, 2 doses of Moderna  ROS: Constitutional: no weight change, no fever ENT/Mouth: no sore throat, no rhinorrhea Eyes: no eye pain, no vision changes Cardiovascular: no chest pain, + dyspnea,  no edema, no palpitations Respiratory: no cough, no sputum, no wheezing Gastrointestinal: no nausea, no vomiting, no diarrhea, no  constipation Genitourinary: no urinary incontinence, no dysuria, no hematuria Musculoskeletal: no arthralgias, + myalgias Skin: no skin lesions, no pruritus, Neuro: + weakness, no loss of consciousness, no syncope Psych: no anxiety, no depression, no decrease appetite Heme/Lymph: no bruising, no bleeding  ED Course: Discussed with ED provider, patient requiring hospitalization for symptomatic anemia.  Vitals in the emergency department was remarkable for temperature of 99.1, respiration rate of 13, heart rate of 89, blood pressure 113/56, SPO2 of 98% on room air.  Labs was remarkable for serum creatinine of 1.04, EGFR greater than 60, hemoglobin 7.7, hematocrit 22.7, platelets 198, WBC 1.6, potassium 3.1, nonfasting blood glucose 110.  COVID PCR/influenza A PCR/influenza B PCR were negative  Assessment/Plan  Principal Problem:   Symptomatic anemia Active Problems:   Acquired hypothyroidism   Benign prostatic hyperplasia with urinary obstruction   Compression fracture of lumbar vertebra (HCC)   Crohn's disease of large intestine (HCC)   Deep vein thrombosis (DVT) (HCC)   Unspecified atrial fibrillation (HCC)   # Symptomatic anemia-suspect secondary to left groin hematoma present on admission # Right groin hematoma-suspect secondary to recent left heart cath on 10/23/2020 - Query possible syncope as patient does not remember what happened - Serum hemoglobin 7.7 on admission - Baseline hemoglobin is 8.1-8.4 - Ultrasound ordered by EDP showed right groin hematoma measuring 7.2 cm and no evidence of pseudoaneurysm or AV fistula in the right groin - 1 unit of PRBC ordered slow infusion over 3 hours - Time to repeat CBC scheduled for 1530 on day of admission - He endorses improved weakness  and shortness of breath post blood transfusion - Repeat cbc increased to 7.8, one unit of prbc ordered, repeat US ordered to assess for expansion of hematoma - H/H ordered for 1900 and lasix ordered  for 1900, with instructions to hold if SBP < 100. - MRI of the brain ordered  # Atrial fibrillation-Eliquis is being held at this time due to acute anemia - A.m. team to resume when hemoglobin is stable  # Spoke with spouse, she endorses that when she found patient he had lost control of his bladder and urinated all over himself and endorsed bilateral lower extremity numbness - Patient states he has had numbness in his lower extremity for many years - However, he states he does not remember that he urinated on self - MRI of the lumbar to assess for stenosis  # History of chronic leukopenia and thrombocytopenia - At baseline, outpatient followup  # History of DVT - holding eliquis at this time  # Hypothyroid-resumed levothyroxine 100 mcg in the a.m.  # Depression/anxiety-sertraline 100 mg daily  # Insomnia-trazodone 100 mg nightly  # Hypokalemia-potassium chloride 40 mill equivalent once - BMP in the a.m.  # Chronic low back pain -Toradol 30 mg every 8 hours as needed for moderate pain, 1 day ordered; morphine 2 mg IV every 4 hours for severe pain, 1 day ordered  Chart reviewed.   DVT prophylaxis: TED hose due to acute hematoma and anemia Code Status: Full code Diet: Heart healthy Family Communication: No Disposition Plan: Pending clinical course Consults called: None at this time Admission status: Observation, MedSurg, telemetry for 24 hours  Past Medical History:  Diagnosis Date   Anemia    Hypothyroidism    Past Surgical History:  Procedure Laterality Date   BACK SURGERY     CHOLECYSTECTOMY     COLON RESECTION     multiple times   LEFT HEART CATH AND CORONARY ANGIOGRAPHY N/A 10/23/2020   Procedure: LEFT HEART CATH AND CORONARY ANGIOGRAPHY with coronary intervention;  Surgeon: Dionisio David, MD;  Location: [place] [facility];  Service: Cardiovascular;  Laterality: N/A;   TOTAL HIP ARTHROPLASTY  05/06/2010   Social History:  reports that he has never smoked. He  has never used smokeless tobacco. He reports that he does not drink alcohol. No history on file for drug use.  No Known Allergies Family History  Problem Relation Age of Onset   Osteoporosis Mother    Family history: Family history reviewed and not pertinent  Prior to Admission medications   Medication Sig Start Date End Date Taking? Authorizing Provider  atorvastatin (LIPITOR) 40 MG tablet Take 40 mg by mouth daily. 08/15/20   [provider]  ELIQUIS 5 MG TABS tablet Take 5 mg by mouth 2 (two) times daily. 08/24/20   [provider]  finasteride (PROSCAR) 5 MG tablet Take 5 mg by mouth daily. 10/04/20   [provider]  gabapentin (NEURONTIN) 100 MG capsule Take 100 mg by mouth 3 (three) times daily. 09/28/20   [provider]  latanoprost (XALATAN) 0.005 % ophthalmic solution Place 1 drop into both eyes at bedtime.    [provider]  levothyroxine (SYNTHROID) 100 MCG tablet Take 100 mcg by mouth daily. 01/11/20 01/10/21  [provider]  metoprolol tartrate (LOPRESSOR) 25 MG tablet Take 1 tablet (25 mg total) by mouth 2 (two) times daily. 10/23/20   Sharen Hones, MD  pantoprazole (PROTONIX) 40 MG tablet Take 1 tablet (40 mg total) by mouth  daily. 10/23/20 11/22/20  Sharen Hones, MD  pilocarpine (SALAGEN) 5 MG tablet Take 5 mg by mouth in the morning, at noon, and at bedtime. 08/11/20 08/11/21  [provider]  pyridoxine (B-6) 100 MG tablet Take 100 mg by mouth daily.    [provider]  sertraline (ZOLOFT) 100 MG tablet Take 100 mg by mouth daily. 05/23/20 05/23/21  [provider]  timolol (TIMOPTIC) 0.5 % ophthalmic solution Place 1 drop into the right eye 2 (two) times daily.    [provider]  traZODone (DESYREL) 100 MG tablet Take 100 mg by mouth at bedtime. 08/25/20   [provider]   Physical Exam: Vitals:   10/26/20 1030 10/26/20 1055 10/26/20 1128 10/26/20 1130  BP: (!) 111/50 (!) 104/56 (!)  98/45 (!) 108/43  Pulse: 84 (!) 101 (!) 108 96  Resp: _0 Temp:  98.4 F (36.9 C) 98.4 F (36.9 C)   TempSrc:  Oral Oral   SpO2: 100% 98%  98%  Weight:      Height:       Constitutional: appears age-appropriate, NAD, calm, comfortable Eyes: PERRL, lids and conjunctivae normal ENMT: Mucous membranes are moist. Posterior pharynx clear of any exudate or lesions. Age-appropriate dentition. Hearing appropriate Neck: normal, supple, no masses, no thyromegaly Respiratory: clear to auscultation bilaterally, no wheezing, no crackles. Normal respiratory effort. No accessory muscle use.  Cardiovascular: irregular rate and rhythm, no murmurs / rubs / gallops. No extremity edema. 2+ pedal pulses. No carotid bruits.  Abdomen: no tenderness, no masses palpated, no hepatosplenomegaly. Bowel sounds positive.  Musculoskeletal: no clubbing / cyanosis. No joint deformity upper and lower extremities. Good ROM, no contractures, no atrophy. Normal muscle tone.  Skin: no rashes, lesions, ulcers. No induration, pale Neurologic: Sensation intact. Strength 4/5 in all 4.  Psychiatric: Normal judgment and insight. Alert and oriented x 3. Normal mood.   EKG: independently reviewed, showing atrial fibrillation with rate of 94, QTc 474  Chest x-ray on Admission: I personally reviewed and I agree with radiologist reading as below.  CT Head Wo Contrast  Result Date: 10/26/2020 CLINICAL DATA:  Minor head trauma EXAM: CT HEAD WITHOUT CONTRAST CT CERVICAL SPINE WITHOUT CONTRAST TECHNIQUE: Multidetector CT imaging of the head and cervical spine was performed following the standard protocol without intravenous contrast. Multiplanar CT image reconstructions of the cervical spine were also generated. COMPARISON:  Brain MRI 06/15/2020 FINDINGS: CT HEAD FINDINGS Brain: No evidence of acute infarction, hemorrhage, hydrocephalus, extra-axial collection or mass lesion/mass effect. Generalized atrophy Vascular: No hyperdense  vessel or unexpected calcification. Skull: Normal. Negative for fracture or focal lesion. Sinuses/Orbits: No acute finding. CT CERVICAL SPINE FINDINGS Alignment: No traumatic malalignment Skull base and vertebrae: No acute fracture. Remote T1 spinous process fracture. Soft tissues and spinal canal: No prevertebral fluid or swelling. No visible canal hematoma. Disc levels: Ordinary disc and facet degeneration which is generalized. Upper chest: Reported separately IMPRESSION: No evidence of intracranial or cervical spine injury. Electronically Signed   By: Monte Fantasia M.D.   On: 10/26/2020 09:53   CT Cervical Spine Wo Contrast  Result Date: 10/26/2020 CLINICAL DATA:  Minor head trauma EXAM: CT HEAD WITHOUT CONTRAST CT CERVICAL SPINE WITHOUT CONTRAST TECHNIQUE: Multidetector CT imaging of the head and cervical spine was performed following the standard protocol without intravenous contrast. Multiplanar CT image reconstructions of the cervical spine were also generated. COMPARISON:  Brain MRI 06/15/2020 FINDINGS: CT HEAD FINDINGS Brain: No evidence of acute infarction, hemorrhage,  hydrocephalus, extra-axial collection or mass lesion/mass effect. Generalized atrophy Vascular: No hyperdense vessel or unexpected calcification. Skull: Normal. Negative for fracture or focal lesion. Sinuses/Orbits: No acute finding. CT CERVICAL SPINE FINDINGS Alignment: No traumatic malalignment Skull base and vertebrae: No acute fracture. Remote T1 spinous process fracture. Soft tissues and spinal canal: No prevertebral fluid or swelling. No visible canal hematoma. Disc levels: Ordinary disc and facet degeneration which is generalized. Upper chest: Reported separately IMPRESSION: No evidence of intracranial or cervical spine injury. Electronically Signed   By: Monte Fantasia M.D.   On: 10/26/2020 09:53   CT CHEST ABDOMEN PELVIS W CONTRAST  Result Date: 10/26/2020 CLINICAL DATA:  Fall in the setting of generalized weakness. Low  back pain. EXAM: CT CHEST, ABDOMEN, AND PELVIS WITH CONTRAST TECHNIQUE: Multidetector CT imaging of the chest, abdomen and pelvis was performed following the standard protocol during bolus administration of intravenous contrast. CONTRAST:  1102m OMNIPAQUE IOHEXOL 300 MG/ML  SOLN COMPARISON:  None. FINDINGS: CT CHEST FINDINGS Cardiovascular: Normal heart size. No pericardial effusion. Coronary atherosclerosis. Mediastinum/Nodes: Negative for adenopathy or mass. Lungs/Pleura: Dependent atelectasis with small pleural effusions. No pulmonary edema or air leak. No hemothorax. Musculoskeletal: Negative for acute fracture or subluxation. Remote right anterior rib fractures. Remote T1 spinous process fracture. CT ABDOMEN PELVIS FINDINGS Hepatobiliary: No focal liver abnormality.Cholecystectomy. No bile duct dilatation Pancreas: Generalized atrophy. Spleen: No acute finding. Adrenals/Urinary Tract: Negative adrenals. Multiple renal calculi, more numerous on the left where there is generalized involvement and a dominant 1 cm lower pole stone. On the right the largest stone is at the upper pole and measures 8 mm. Right internal ureteral stent and located position. Mild urothelial thickening at the right ureter which is likely reactive. The bladder is partially obscured by hip prosthesis but unremarkable where seen. Stomach/Bowel:  No obstruction. No visible inflammation Vascular/Lymphatic: Atheromatous calcification of the aorta. Fat stranding around the right common femoral and superficial femoral arteries which is attributed to recent coronary angiography. No discrete hematoma or pseudoaneurysm. No mass or adenopathy. Reproductive:No acute finding. Other: No ascites or pneumoperitoneum. Fatty enlargement of the inguinal canals. Musculoskeletal: Right hip arthroplasty. Multilevel bridging osteophytes in the lower thoracic and lumbar spine. IMPRESSION: 1. No posttraumatic finding. 2. Changes of right femoral access for cardiac  catheterization. No hematoma. 3. Dependent atelectasis and trace pleural effusions with mild progression from CT 6 days ago. 4. Bilateral nephrolithiasis. Normally located right-sided ureteral stent. Electronically Signed   By: JMonte FantasiaM.D.   On: 10/26/2020 09:49   [place] Ext Art Right Ltd  Result Date: 10/26/2020 CLINICAL DATA:  Right groin pain and swelling. Recent heart catheterization. EXAM: RIGHT LOWER EXTREMITY ARTERIAL DUPLEX - LIMITED TECHNIQUE: Imaging was performed at the area of concern in the right groin. COMPARISON:  None. FINDINGS: Images were obtained over the right groin. Normal triphasic Doppler flow in the right common femoral artery. There is color Doppler flow in the adjacent common femoral vein. There is no evidence to suggest a pseudoaneurysm or AV fistula. There is a heterogeneous hypoechoic structure in the right groin that is compatible with hematoma that measures 6.2 x 4.5 x 7.2 cm. There is no definite arterial flow within this heterogeneous collection. IMPRESSION: 1. Right groin hematoma measuring up to 7.2 cm. 2. No evidence for a pseudoaneurysm or AV fistula in the right groin. Electronically Signed   By: AMarkus DaftM.D.   On: 10/26/2020 11:45   CT L-SPINE NO CHARGE  Result Date: 10/26/2020 CLINICAL DATA:  Fall EXAM: CT Lumbar spine with contrast TECHNIQUE: Multiplanar CT images of the lumbar spine were reconstructed from contemporary CT of the abdomen and pelvis CONTRAST:  No additional COMPARISON:  Correlation made with CT abdomen October 20, 2020 FINDINGS: Segmentation: 5 lumbar type vertebrae. Alignment: Preserved. Vertebrae: Stable lumbar vertebral body heights. Chronic T12 compression fracture. There is no acute fracture. Paraspinal and other soft tissues: Extra-spinal findings are better evaluated on concurrent dedicated imaging. There is no paraspinal hematoma. Right total hip arthroplasty with associated streak artifact. Disc levels: Mild multilevel degenerative  changes are present, greatest at L5-S1. No high-grade osseous encroachment on the spinal canal. IMPRESSION: No acute lumbar spine fracture. Electronically Signed   By: Macy Mis M.D.   On: 10/26/2020 09:19   DG Chest Port 1 View  Result Date: 10/26/2020 CLINICAL DATA:  Questionable sepsis. EXAM: PORTABLE CHEST 1 VIEW COMPARISON:  CT 10/20/2020.  Chest x-ray 10/20/2020. FINDINGS: Cardiomegaly with bilateral interstitial prominence and small bilateral effusions. Findings consistent with CHF. Surgical clips right upper quadrant. Degenerative change and scoliosis thoracic spine. IMPRESSION: Cardiomegaly with bilateral interstitial prominence and small bilateral pleural effusions. Findings consistent with CHF. Electronically Signed   By: Marcello Moores  Register   On: 10/26/2020 08:12    Labs on Admission: I have personally reviewed following labs  CBC: Recent Labs  Lab 10/20/20 1949 10/22/20 0140 10/23/20 0218 10/26/20 0601  WBC 2.3* 1.3* 1.1* 1.6*  NEUTROABS 1.4*  --   --  0.8*  HGB 8.4* 8.4* 8.1* 7.7*  HCT 25.4* 25.7* 24.4* 22.7*  MCV 111.9* 113.2* 112.4* 111.3*  PLT 170 184 203 384   Basic Metabolic Panel: Recent Labs  Lab 10/20/20 1949 10/22/20 0140 10/23/20 0218 10/26/20 0601  NA 133* 142 140 137  K 3.7 3.4* 3.5 3.1*  CL 106 113* 111 109  CO2 _0 GLUCOSE 116* 107* 97 110*  BUN _1 CREATININE 1.08 1.05 0.94 1.06  CALCIUM 8.1* 8.0* 7.7* 7.9*  MG  --  2.0 2.0 1.7   GFR: Estimated Creatinine Clearance: 63.4 mL/min (by C-G formula based on SCr of 1.06 mg/dL).  Liver Function Tests: Recent Labs  Lab 10/20/20 1949 10/26/20 0601  AST 24 28  ALT 13 19  ALKPHOS 81 73  BILITOT 1.6* 1.3*  PROT 5.8* 5.9*  ALBUMIN 2.7* 2.6*   Recent Labs  Lab 10/20/20 1949  LIPASE 28   Coagulation Profile: Recent Labs  Lab 10/21/20 0219 10/23/20 0218 10/26/20 0601  INR 1.8* 1.2 1.5*   Urine analysis:    Component Value Date/Time   COLORURINE AMBER (A) 10/26/2020  0811   APPEARANCEUR CLOUDY (A) 10/26/2020 0811   LABSPEC 1.017 10/26/2020 0811   PHURINE 5.0 10/26/2020 0811   GLUCOSEU NEGATIVE 10/26/2020 0811   HGBUR LARGE (A) 10/26/2020 0811   BILIRUBINUR NEGATIVE 10/26/2020 0811   KETONESUR NEGATIVE 10/26/2020 0811   PROTEINUR 100 (A) 10/26/2020 0811   NITRITE NEGATIVE 10/26/2020 0811   LEUKOCYTESUR NEGATIVE 10/26/2020 0811   CRITICAL CARE Performed by: Briant Cedar Shannelle Alguire  Total critical care time: 35 minutes  Critical care time was exclusive of separately billable procedures and treating other patients.  Critical care was necessary to treat or prevent imminent or life-threatening deterioration. Symptomatic anemia  Critical care was time spent personally by me on the following activities: development of treatment plan with patient and/or surrogate as well as nursing, discussions with consultants, evaluation of patient's response to treatment, examination of patient, obtaining history from patient  or surrogate, ordering and performing treatments and interventions, ordering and review of laboratory studies, ordering and review of radiographic studies, pulse oximetry and re-evaluation of patient's condition.  Dr. Tobie Poet [facility]  If 7PM-7AM, please contact overnight-coverage provider If 7AM-7PM, please contact day coverage provider [email or web address]  10/26/2020, 12:14 PM

## 2020-10-26 NOTE — ED Notes (Signed)
Md Cox aware of pt's bp. Pt remains asymptomatic at this time. See orders to transfuse 1 unit of RBC.

## 2020-10-26 NOTE — ED Notes (Signed)
Md at bedside

## 2020-10-26 NOTE — ED Provider Notes (Signed)
8:06 AM Assumed care for off going team.   Blood pressure 104/60, pulse 94, temperature 99.1 F (37.3 C), resp. rate (!) 27, height 5\' 8"  (1.727 m), weight 83.5 kg, SpO2 96 %.  See their HPI for full report but in brief Chest xray, lactate, BC pending- 751 systolic, 700FV fluid given for 120 HR-- tachypnea as well is on eliquis. Very weak-does not feel like coming. On eliquis for aifb  8:09 AM patient had x-ray done but was complaining of significant back pain.  He states he is not sure how he fell today.  He does now admit to hitting his head and given patient's on Eliquis will get CT head evaluate for intercranial hemorrhage.  Given his hemoglobin is lower than normal as well as the new back pain and tachypnea on exam we will get pan CT scan to further evaluate for retropharyngeal hemorrhage.  Patient does have significant swelling on his right groin status post catheterization 3 days ago.  He states that he started noticing it yesterday.  He is got a good distal pulse in the foot but will most likely require ultrasound to make sure no evidence of pseudoaneurysm.  Given patient's fatigue, tachycardia will transfuse 1 unit of blood for symptomatic anemia given his hemoglobin is lower than baseline.  68-month ago patient was 9.9 so I suspect that some of this weakness could be secondary to his continued falling hemoglobin patient has been consented and agrees to transfusion.  Patient's chest x-ray does show some pleural effusions but he had these previously.  We will add on BNP and continue to closely monitor.  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  Does have some mild worsening of his pleural effusions from 6 days ago.  We will make sure to give his blood slowly and may need to get some Lasix afterwards.  We will get ultrasound of the right area to make sure no pseudoaneurysm.  Patient will require admission to the hospital due to his  increasing weakness.   .Critical Care  Date/Time: 10/26/2020 11:57 AM Performed by: Vanessa Lima, MD Authorized by: Vanessa Lu Verne, MD   Critical care provider statement:    Critical care time (minutes):  45   Critical care was necessary to treat or prevent imminent or life-threatening deterioration of the following conditions: symptomatic anemia.   Critical care was time spent personally by me on the following activities:  Discussions with consultants, evaluation of patient's response to treatment, examination of patient, ordering and performing treatments and interventions, ordering and review of laboratory studies, ordering and review of radiographic studies, pulse oximetry, re-evaluation of patient's condition, obtaining history from patient or surrogate and review of old charts              Vanessa Morgandale, MD 10/26/20 1158

## 2020-10-26 NOTE — ED Provider Notes (Signed)
[facility] Emergency Department Provider Note  ____________________________________________   Event Date/Time   First MD Initiated Contact with Patient 10/26/20 [phone]     (approximate)  I have reviewed the triage vital signs and the nursing notes.   HISTORY  Chief Complaint Fall  Level 5 caveat:  history/ROS limited by acute/critical illness  HPI Gordon Farrell is a 75 y.o. male with extensive medical history as listed below which notably includes recent hospitalization with discharged just about 3 days ago after admission for probable NSTEMI status post cardiac catheterization 3 days ago.  He also has a history of A. fib, DVT, and is on Eliquis.  He has a ureteral stent in place with known renal/ureteral stones.  The patient presents tonight after a fall at home.  He said that he feels so tired and weak that he could not get up and move around and when he finally did so he collapsed again to the floor.  He did not strike his head nor completely lose consciousness and he has no neck pain at this time.  When EMS arrived they found that his blood pressure was about 90 systolic and they gave him some IV fluids before his arrival to the ED at which point his systolic blood pressure was up just over 631 systolic.  The patient says that since he went home from the hospital about 3 days ago he has felt increasingly worse, gradually over time.  Specifically he feels more weak, fatigued, and short of breath.  He has not having a cough.  He denies fever and sore throat.  He denies nausea, vomiting, and abdominal pain, but he has had less oral intake recently.  He has no issues or concerns with his arms and legs other than that he feels generally weak but without any focal weakness.  overall he describes his symptoms as severe.     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 Authorizing Provider  atorvastatin (LIPITOR) 40 MG tablet Take 40 mg by mouth daily. 08/15/20   [provider]  ELIQUIS 5 MG TABS tablet Take 5 mg by mouth 2 (two) times daily. 08/24/20   [provider]  finasteride (PROSCAR) 5 MG tablet Take 5 mg by mouth daily. 10/04/20   [provider]  gabapentin (NEURONTIN) 100 MG capsule Take 100 mg by mouth 3 (three) times daily. 09/28/20   [provider]  latanoprost (XALATAN) 0.005 % ophthalmic solution Place 1 drop into both eyes at bedtime.    [provider]  levothyroxine (SYNTHROID) 100 MCG tablet Take 100 mcg by mouth daily. 01/11/20 01/10/21  [provider]  metoprolol tartrate (LOPRESSOR) 25 MG tablet Take 1 tablet (25 mg total) by mouth 2 (two) times daily. 10/23/20   Sharen Hones, MD  pantoprazole (PROTONIX) 40 MG tablet Take 1 tablet (40 mg total) by mouth daily. 10/23/20 11/22/20  Sharen Hones, MD  pilocarpine (SALAGEN) 5 MG tablet Take 5 mg by mouth in the morning,  at noon, and at bedtime. 08/11/20 08/11/21  [provider]  pyridoxine (B-6) 100 MG tablet Take 100 mg by mouth daily.    [provider]  sertraline (ZOLOFT) 100 MG tablet Take 100 mg by mouth daily. 05/23/20 05/23/21  [provider]  timolol (TIMOPTIC) 0.5 % ophthalmic solution Place 1 drop into the right eye 2 (two) times daily.    [provider]  traZODone (DESYREL) 100 MG tablet Take 100 mg by mouth at bedtime. 08/25/20   [provider]    Allergies Patient has no known allergies.  Family History  Problem Relation Age of Onset   Osteoporosis Mother     Social History Social History   Tobacco Use   Smoking status: Never   Smokeless tobacco: Never  Vaping Use   Vaping Use: Never used  Substance Use Topics   Alcohol use: No    Alcohol/week: 0.0 standard drinks    Review of Systems Constitutional: Malaise and weakness.  No fever/chills Eyes: No visual changes. ENT: No sore throat. Cardiovascular: Denies chest pain. Respiratory: Positive for shortness of breath. Gastrointestinal: No abdominal pain.  No nausea, no vomiting.  No diarrhea.  No constipation. Genitourinary: Negative for dysuria. Musculoskeletal: Negative for neck pain.  Negative for back pain. Integumentary: Negative for rash. Neurological: Negative for headaches, focal weakness or numbness.   ____________________________________________   PHYSICAL EXAM:  ED Triage Vitals  Enc Vitals Group     BP 10/26/20 0559 104/68     Pulse Rate 10/26/20 0559 92     Resp 10/26/20 0559 14     Temp 10/26/20 0559 99.1 F (37.3 C)     Temp src --      SpO2 10/26/20 0559 98 %     Weight 10/26/20 0600 83.5 kg (184 lb)     Height 10/26/20 0600 1.727 m (5\' 8" )     Head Circumference --      Peak Flow --      Pain Score 10/26/20 0600 7     Pain Loc --      Pain Edu? --      Excl. in [place]? --     Constitutional: Alert and oriented.  Hard of hearing.  Appears chronically ill  but nontoxic. Eyes: Conjunctivae are normal.  Head: Atraumatic. Nose: No congestion/rhinnorhea. Mouth/Throat: Patient is wearing a mask. Neck: No stridor.  No meningeal signs.   Cardiovascular: Normal rate, irregular rhythm. Good peripheral circulation. Respiratory: Patient is tachypneic but has clear lung sounds bilaterally upon stethoscope auscultation.  He said that he feels short of breath. Gastrointestinal: Soft and nontender. No distention.  Musculoskeletal: No lower extremity tenderness nor edema. No gross deformities of extremities. Neurologic:  Normal speech and language. No gross focal neurologic deficits are appreciated.  Skin:  Skin is warm, dry and intact.  ____________________________________________   LABS (all labs ordered are listed, but only abnormal results are displayed)  Labs Reviewed  CBC WITH DIFFERENTIAL/PLATELET - Abnormal; Notable for the following components:      Result Value   WBC 1.6 (*)    RBC 2.04 (*)    Hemoglobin 7.7 (*)    HCT 22.7 (*)    MCV 111.3 (*)    MCH 37.7 (*)    RDW 17.7 (*)    nRBC 1.2 (*)    Neutro Abs 0.8 (*)    Lymphs Abs 0.2 (*)    All other components within normal limits  PROTIME-INR - Abnormal; Notable for the following components:   Prothrombin Time 18.2 (*)    INR 1.5 (*)    All other components within normal limits  COMPREHENSIVE METABOLIC PANEL - Abnormal; Notable for the following components:   Potassium 3.1 (*)    Glucose, Bld 110 (*)    Calcium 7.9 (*)    Total Protein 5.9 (*)    Albumin 2.6 (*)    Total Bilirubin 1.3 (*)    All other components within normal limits  RESP PANEL BY RT-PCR (FLU A&B, COVID) ARPGX2  CULTURE, BLOOD (SINGLE)  URINE CULTURE  MAGNESIUM  LACTIC ACID, PLASMA  LACTIC ACID, PLASMA  URINALYSIS, COMPLETE (UACMP) WITH MICROSCOPIC  TYPE AND SCREEN  TROPONIN I (HIGH SENSITIVITY)  TROPONIN I (HIGH SENSITIVITY)   ____________________________________________  EKG  ED ECG REPORT I, Hinda Kehr, the attending physician, personally viewed and interpreted this ECG.  Date: 10/26/2020 EKG Time: 5:56 AM Rate: 94 Rhythm: Atrial fibrillation QRS Axis: normal Intervals: normal ST/T Wave abnormalities: Non-specific ST segment / T-wave changes, but no clear evidence of acute ischemia. Narrative Interpretation: no definitive evidence of acute ischemia; does not meet STEMI criteria.  ____________________________________________  RADIOLOGY  ED MD interpretation:  CXR pending at time of signout   ____________________________________________  .1-3 Lead EKG Interpretation  Date/Time: 10/26/2020 7:36 AM Performed by: Hinda Kehr, MD Authorized by: Hinda Kehr, MD     Interpretation: abnormal     ECG rate:  120   ECG rate assessment: tachycardic     Rhythm: atrial fibrillation     Ectopy: none     Conduction: normal    ____________________________________________    INITIAL IMPRESSION / MDM / ASSESSMENT AND PLAN / ED COURSE  As part of my medical decision making, I reviewed the following data within the [place] notes reviewed and incorporated, Labs reviewed , EKG interpreted , Old chart reviewed, Patient signed out to Dr. Jari Pigg, Radiograph reviewed , and Notes from prior ED visits   Differential diagnosis includes, but is not limited to, sepsis, dehydration, anemia, acute traumatic injury, aortic dissection, urinary tract infection, bacteremia.  The patient is on the cardiac monitor to evaluate for evidence of arrhythmia and/or significant heart rate changes.  Initially I was concerned about the possibility of aortic dissection but I reviewed the medical record and see that he received a CTA chest abdomen pelvis about 6 days ago given his initial presentation which was similar to tonight's.  The patient is afebrile tonight but does have tachypnea and he is borderline tachycardic.  He is having no infectious signs or symptoms but is feeling  generally worse over time.  His lab work is notable for persistent but slightly worsening pancytopenia.  His troponin has normalized from what it was when he was admitted for NSTEMI.  Originally I was going to obtain a CT scan of the chest but since he had his recent scan I do not think it  is indicated particularly since he is on Eliquis; PE is unlikely.  However I ordered a chest x-ray to evaluate for the possibility of developing healthcare associated pneumonia in light of his recent hospitalization and procedure.   I made him a "possible sepsis" based on the use of the order set and based on his vital signs.  The patient says he does not feel well at all.  I am transferring ED care to Dr. Jari Pigg to check on the remainder of his lab results and disposition appropriately after reassessment.         ____________________________________________  FINAL CLINICAL IMPRESSION(S) / ED DIAGNOSES  Final diagnoses:  Symptomatic anemia  Generalized weakness  Pancytopenia (HCC)  Atrial fibrillation with RVR ([place])     MEDICATIONS GIVEN DURING THIS VISIT:  Medications  sodium chloride 0.9 % bolus 500 mL (has no administration in time range)     ED Discharge Orders     None        Note:  This document was prepared using Dragon voice recognition software and may include unintentional dictation errors.   Hinda Kehr, MD 10/26/20 [phone]

## 2020-10-27 ENCOUNTER — Observation Stay: Payer: Medicare Other

## 2020-10-27 ENCOUNTER — Encounter: Payer: Self-pay | Admitting: Internal Medicine

## 2020-10-27 DIAGNOSIS — I480 Paroxysmal atrial fibrillation: Secondary | ICD-10-CM | POA: Diagnosis not present

## 2020-10-27 DIAGNOSIS — S301XXA Contusion of abdominal wall, initial encounter: Secondary | ICD-10-CM

## 2020-10-27 DIAGNOSIS — I6389 Other cerebral infarction: Secondary | ICD-10-CM

## 2020-10-27 DIAGNOSIS — D649 Anemia, unspecified: Secondary | ICD-10-CM | POA: Diagnosis not present

## 2020-10-27 LAB — BPAM RBC
Blood Product Expiration Date: 202206282359
Blood Product Expiration Date: 202206292359
ISSUE DATE / TIME: 202206231101
ISSUE DATE / TIME: 202206231752
Unit Type and Rh: 9500
Unit Type and Rh: 9500

## 2020-10-27 LAB — TYPE AND SCREEN
ABO/RH(D): AB POS
Antibody Screen: NEGATIVE
Unit division: 0
Unit division: 0

## 2020-10-27 LAB — CBC
HCT: 22.7 % — ABNORMAL LOW (ref 39.0–52.0)
Hemoglobin: 7.9 g/dL — ABNORMAL LOW (ref 13.0–17.0)
MCH: 36.1 pg — ABNORMAL HIGH (ref 26.0–34.0)
MCHC: 34.8 g/dL (ref 30.0–36.0)
MCV: 103.7 fL — ABNORMAL HIGH (ref 80.0–100.0)
Platelets: 153 10*3/uL (ref 150–400)
RBC: 2.19 MIL/uL — ABNORMAL LOW (ref 4.22–5.81)
WBC: 2.3 10*3/uL — ABNORMAL LOW (ref 4.0–10.5)
nRBC: 0 % (ref 0.0–0.2)

## 2020-10-27 LAB — HEMOGLOBIN AND HEMATOCRIT, BLOOD
HCT: 22.4 % — ABNORMAL LOW (ref 39.0–52.0)
Hemoglobin: 7.8 g/dL — ABNORMAL LOW (ref 13.0–17.0)

## 2020-10-27 LAB — BASIC METABOLIC PANEL
Anion gap: 7 (ref 5–15)
BUN: 20 mg/dL (ref 8–23)
CO2: 21 mmol/L — ABNORMAL LOW (ref 22–32)
Calcium: 7.7 mg/dL — ABNORMAL LOW (ref 8.9–10.3)
Chloride: 107 mmol/L (ref 98–111)
Creatinine, Ser: 1.21 mg/dL (ref 0.61–1.24)
GFR, Estimated: >60 mL/min (ref >60)
Glucose, Bld: 102 mg/dL — ABNORMAL HIGH (ref 70–99)
Potassium: 3.6 mmol/L (ref 3.5–5.1)
Sodium: 135 mmol/L (ref 135–145)

## 2020-10-27 IMAGING — MR MR MRA HEAD W/O CM
1 series · 19 of 48 positions shown · IV contrast (gadavist)
Comparison: None.

CLINICAL DATA: Stroke workup.

EXAM:
MRA NECK WITHOUT AND WITH CONTRAST
MRA HEAD WITHOUT CONTRAST
TECHNIQUE: Multiplanar and multiecho pulse sequences of the neck were obtained
without and with intravenous contrast. Angiographic images of the
neck were obtained using MRA technique without and with intravenous
contrast; Angiographic images of the Circle of [place] were obtained
using MRA technique without intravenous contrast.
CONTRAST:  7.5mL GADAVIST GADOBUTROL 1 MMOL/ML IV SOLN

[Series 6: TOF · axial · B · 0.5mm · 0.41mm/px · z∈[-70,+27]mm · 19 of 205 slices shown]
[im 1/205]
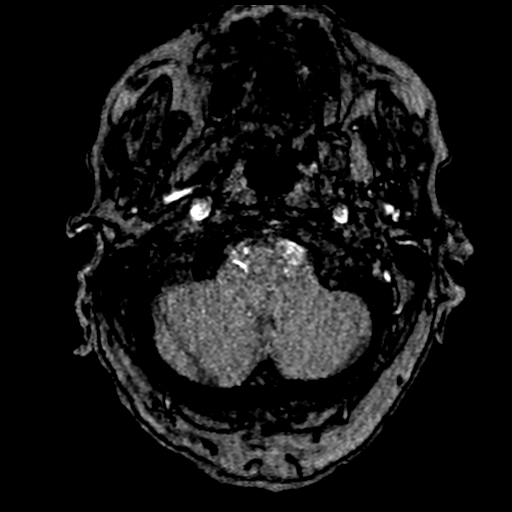
[im 5/205]
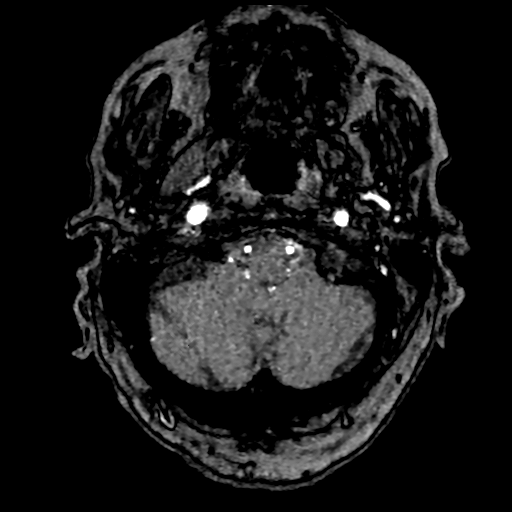
[im 9/205]
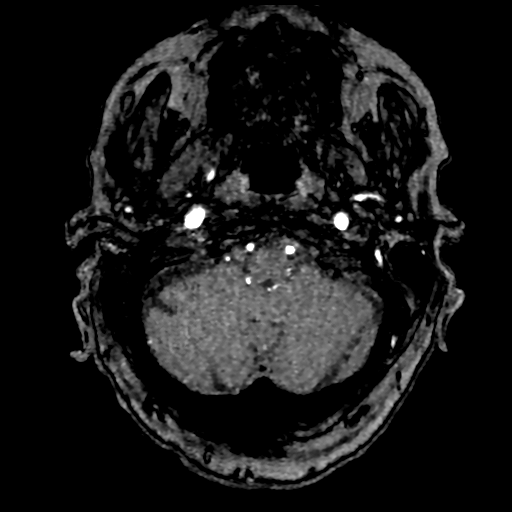
[im 14/205]
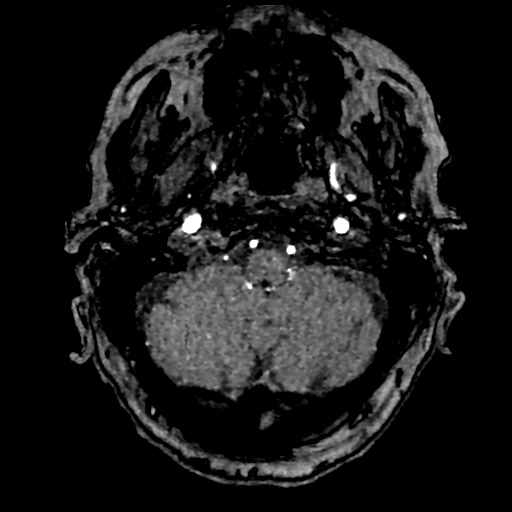
[im 18/205]
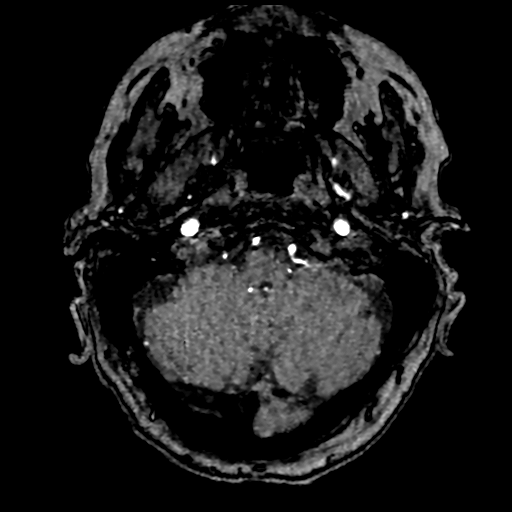
[im 22/205]
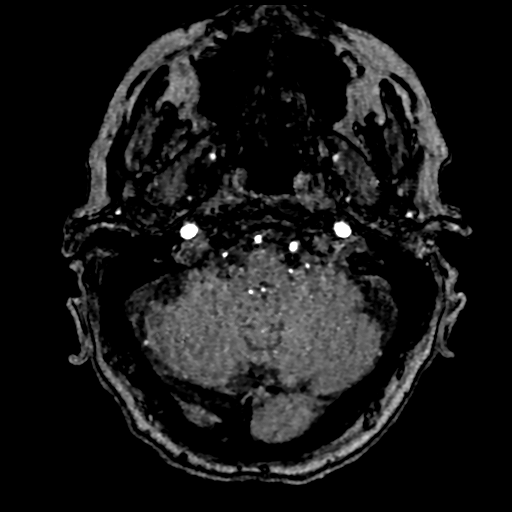
[im 27/205]
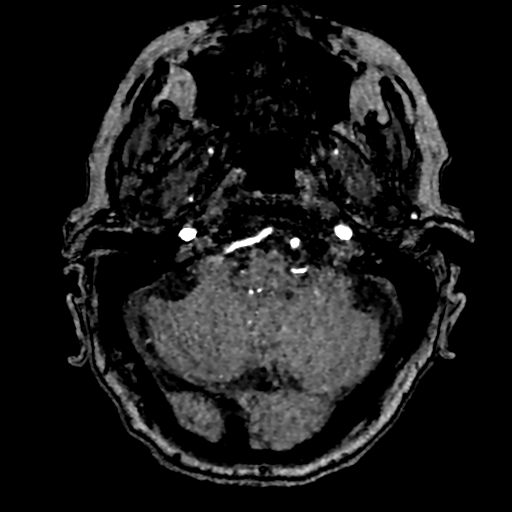
[im 31/205]
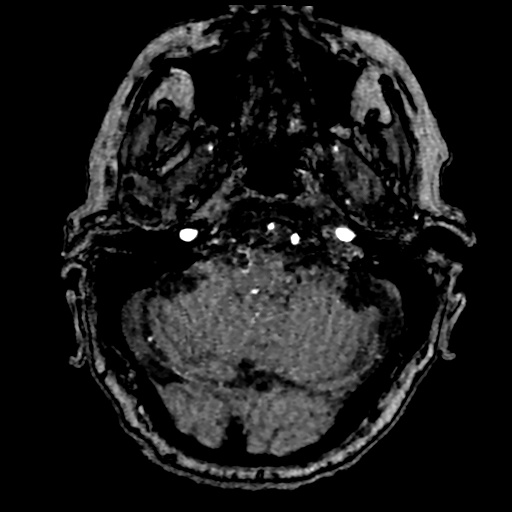
[im 35/205]
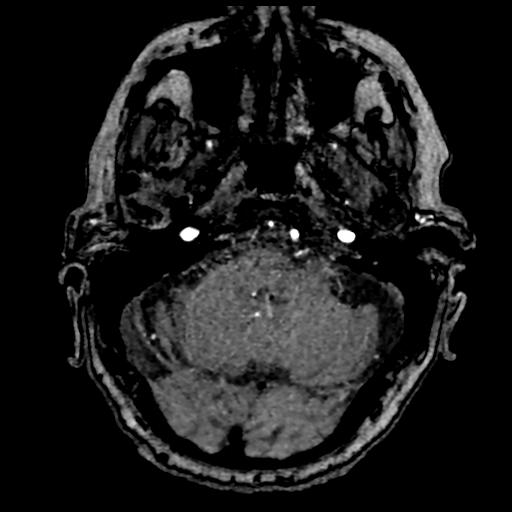
[im 40/205]
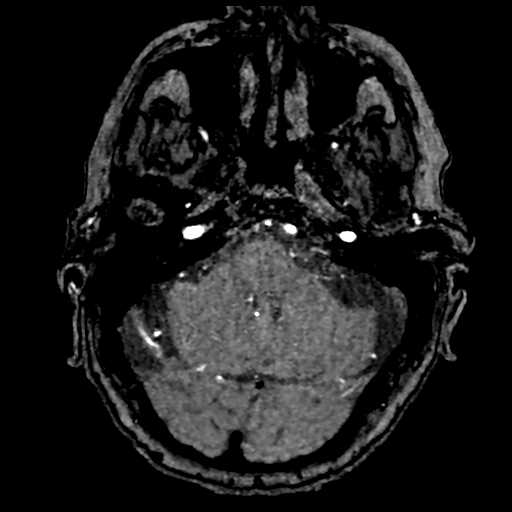
[im 44/205]
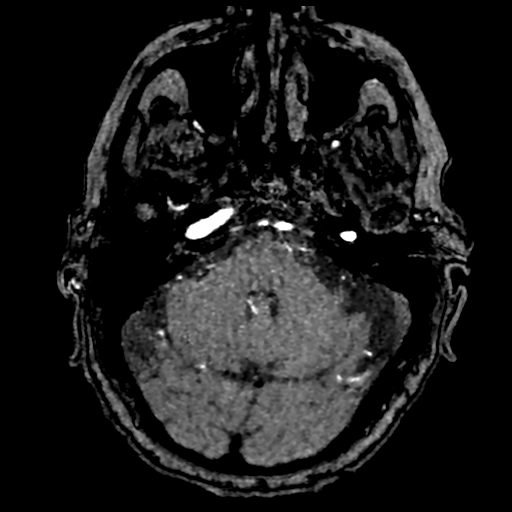
[im 66/205]
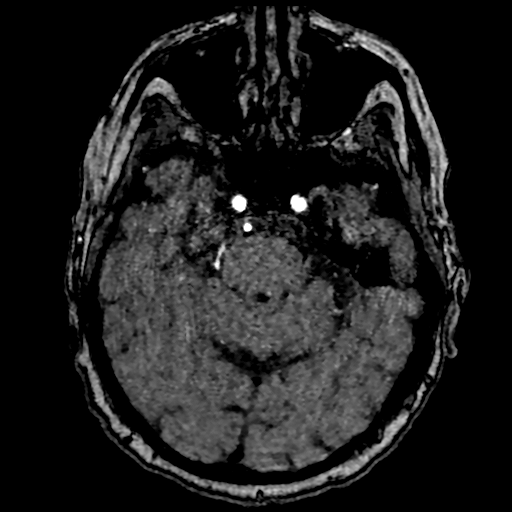
[im 92/205]
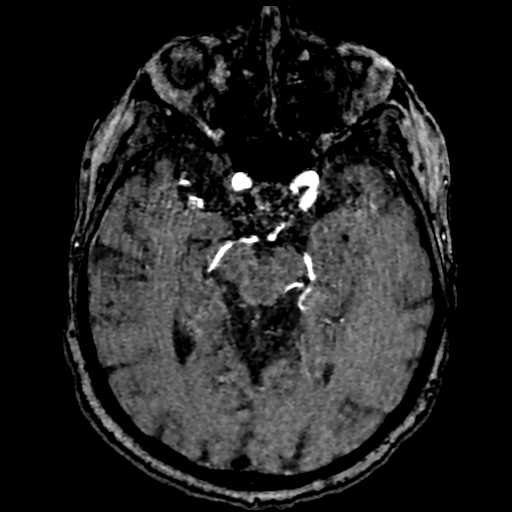
[im 105/205]
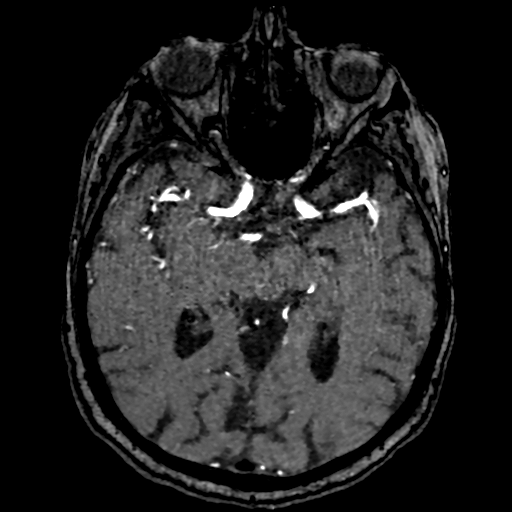
[im 118/205]
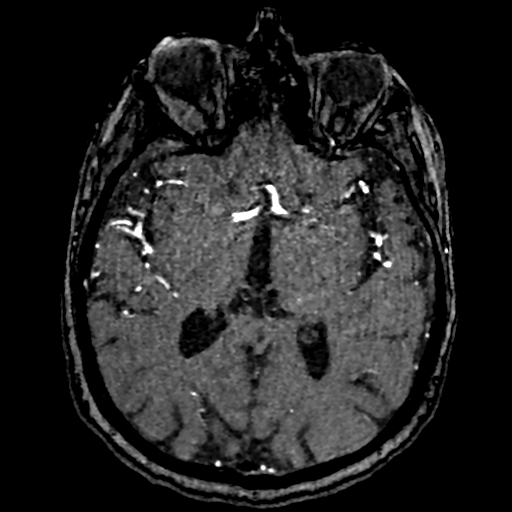
[im 144/205]
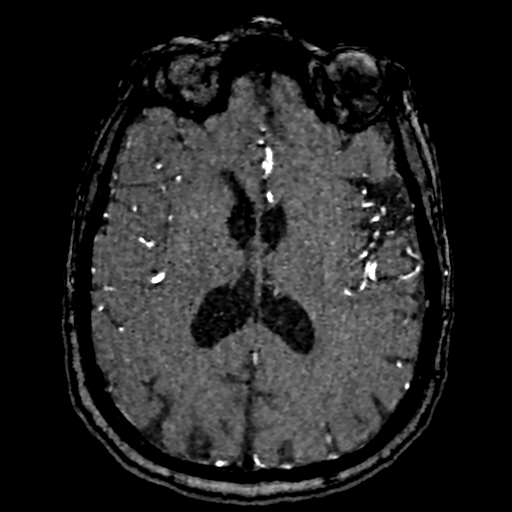
[im 170/205]
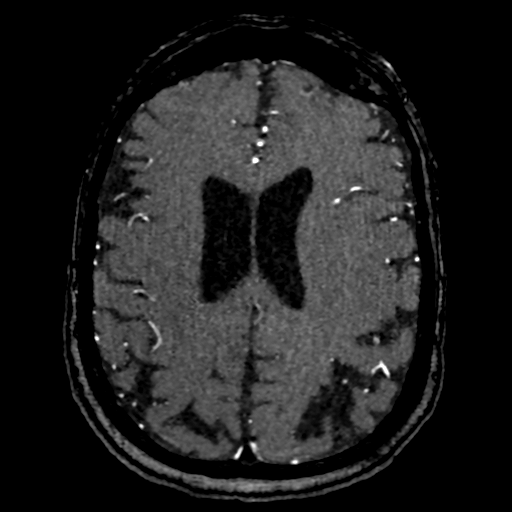
[im 174/205]
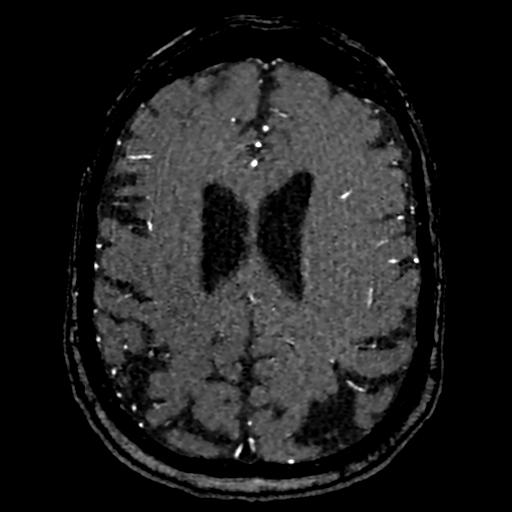
[im 196/205]
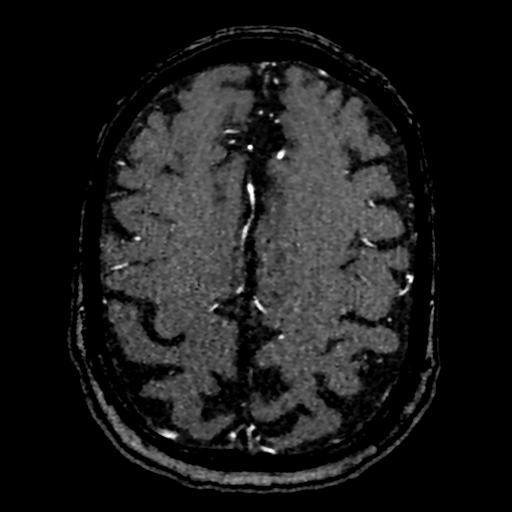

[19 of 48 positions shown; findings below may reference images not displayed]

FINDINGS: MRA NECK FINDINGS

Motion limited evaluation.

Aorta: Great vessel origins are patent.

Right carotid system: Patent without evidence of flow limiting
stenosis.

Left carotid system: Patent without evidence of flow limiting
stenosis.

Vertebral arteries: Left dominant. Patent without evidence of flow
limiting stenosis.

MRA HEAD FINDINGS

Anterior circulation: Bilateral ICAs, MCAs, and ACAs are patent
without proximal flow limiting stenosis. No aneurysm identified.

Posterior circulation: Bilateral intradural vertebral arteries,
basilar artery and posterior cerebral arteries are patent without
proximal flow limiting stenosis. Prominent bilateral posterior
communicating arteries. No aneurysm identified.
IMPRESSION: No evidence of a large vessel occlusion or proximal flow limiting
stenosis in the head or neck.

## 2020-10-27 IMAGING — CT CT ABD-PELV W/O CM
2 of 4 series · 15 of 46 positions shown, 17 images · non-contrast
Comparison: Yesterday.

CLINICAL DATA: Decreased hemoglobin. Taking Eliquis for atrial
fibrillation. Clinical concern for retroperitoneal hematoma. Fell
today.

EXAM:
CT ABDOMEN AND PELVIS WITHOUT CONTRAST
TECHNIQUE: Multidetector CT imaging of the abdomen and pelvis was performed
following the standard protocol without IV contrast.

[Series 2: routine abd/pel wo · axial · 0.87mm/px · z∈[-1258,-798]mm · 12 of 106 slices shown, 14 images]
[im 9/106  soft-tissue]
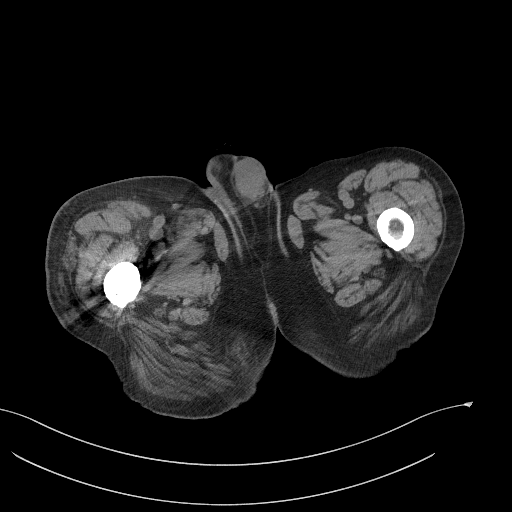
[im 9/106  bone]
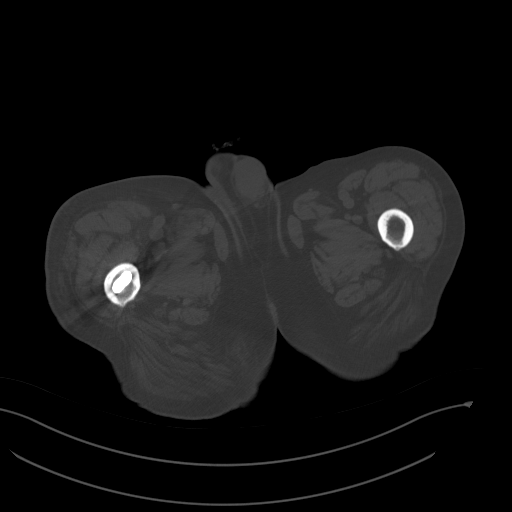
[im 17/106  soft-tissue]
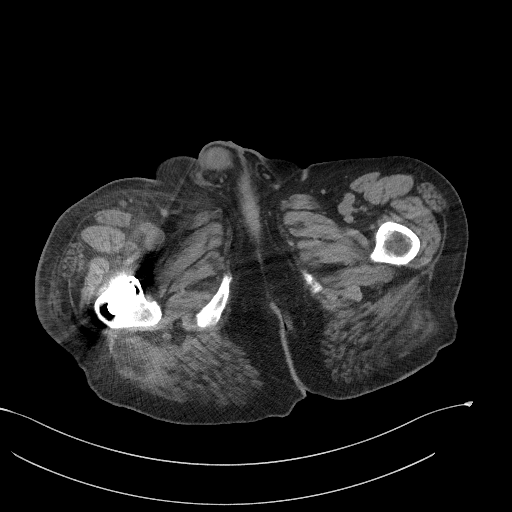
[im 26/106  soft-tissue]
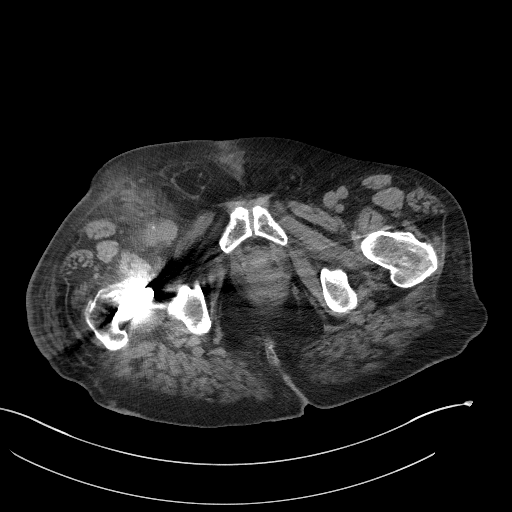
[im 34/106  soft-tissue]
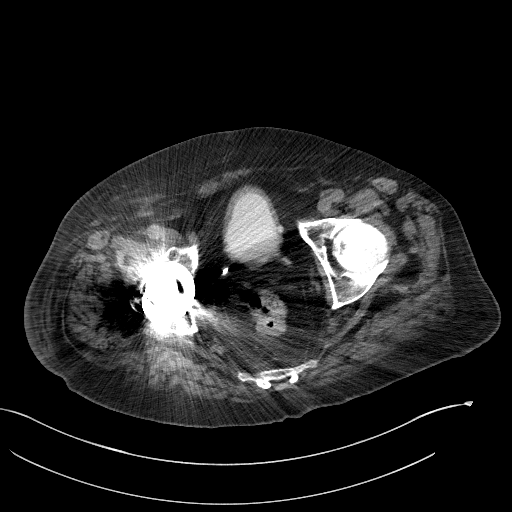
[im 43/106  soft-tissue]
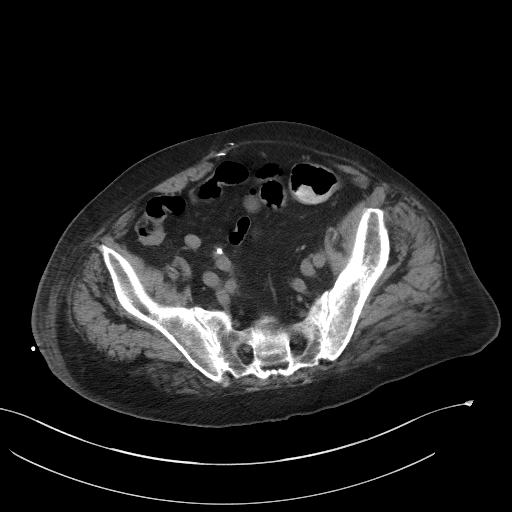
[im 51/106  soft-tissue]
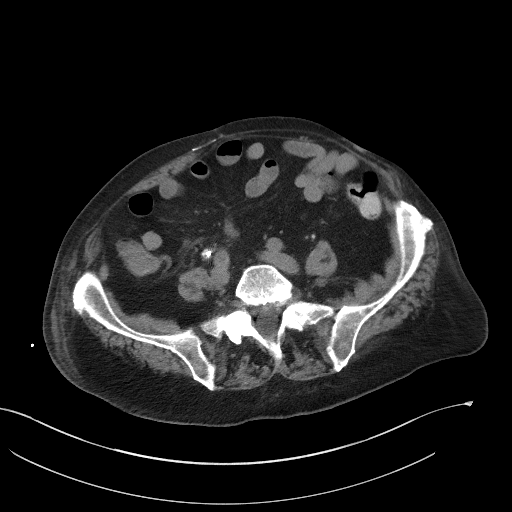
[im 59/106  soft-tissue]
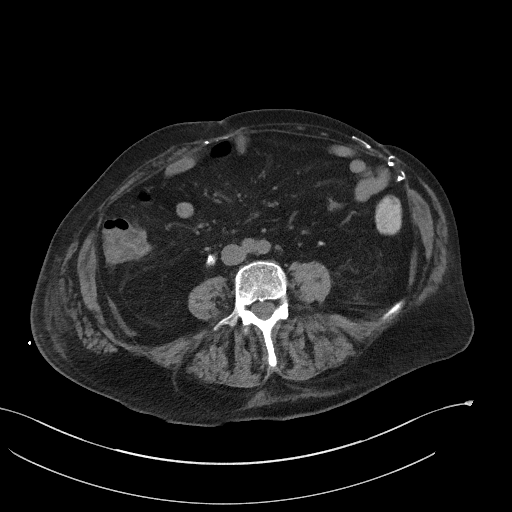
[im 68/106  soft-tissue]
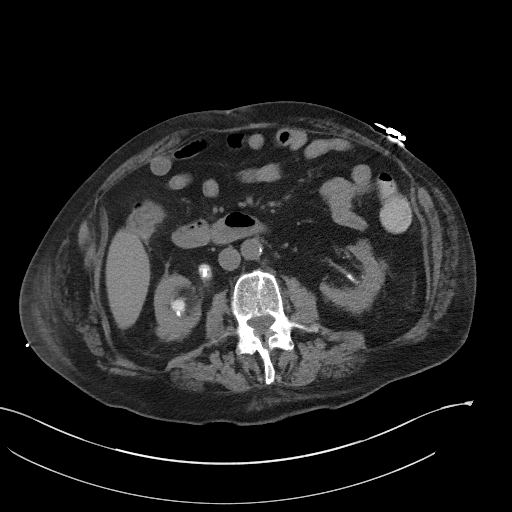
[im 76/106  soft-tissue]
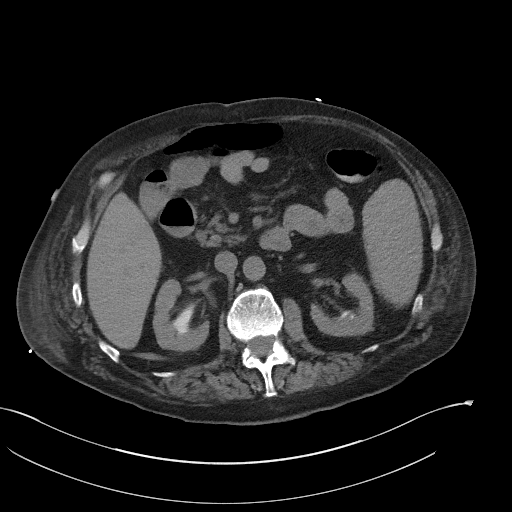
[im 76/106  bone]
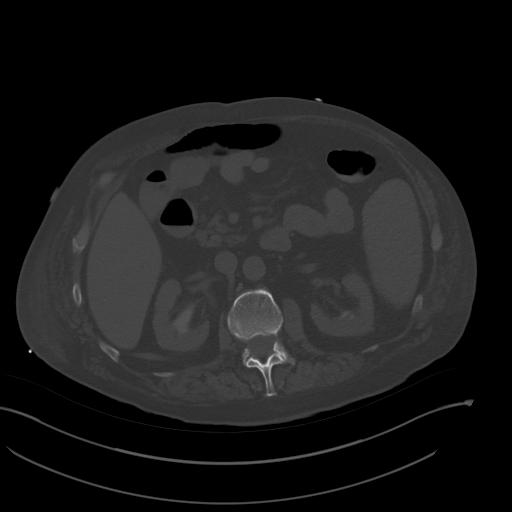
[im 85/106  soft-tissue]
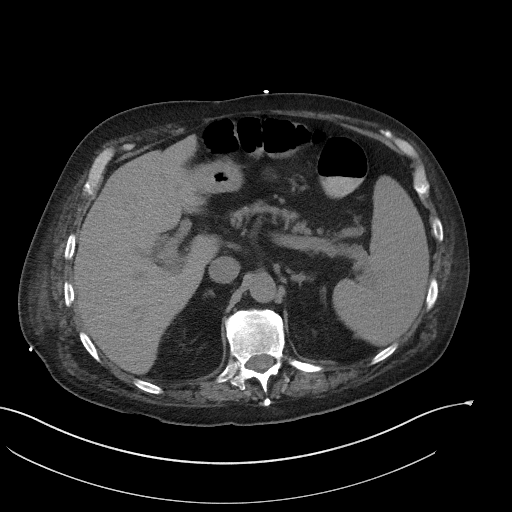
[im 93/106  soft-tissue]
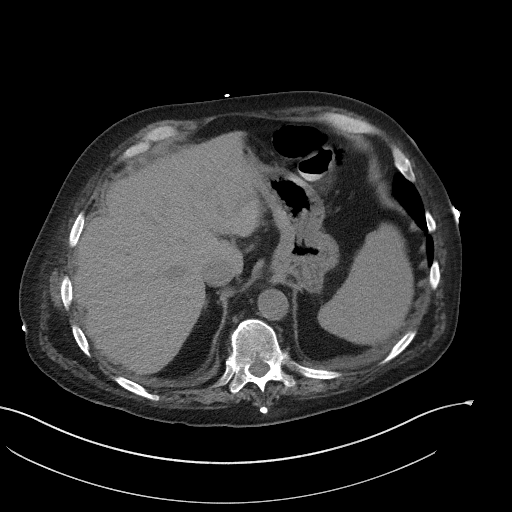
[im 101/106  soft-tissue]
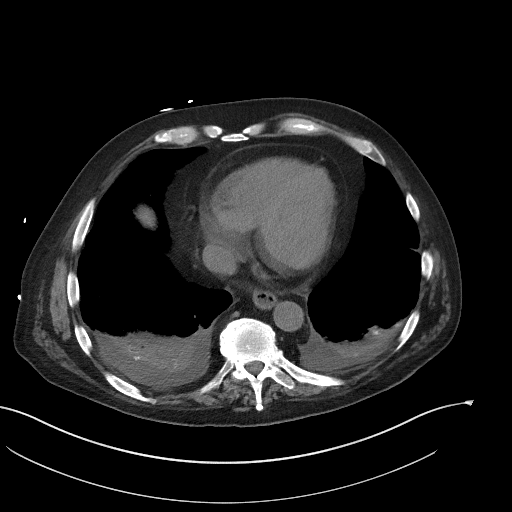

[Series 5: coronal st · coronal · 0.80mm/px · 3 of 98 slices shown]
[im 33/98  soft-tissue]
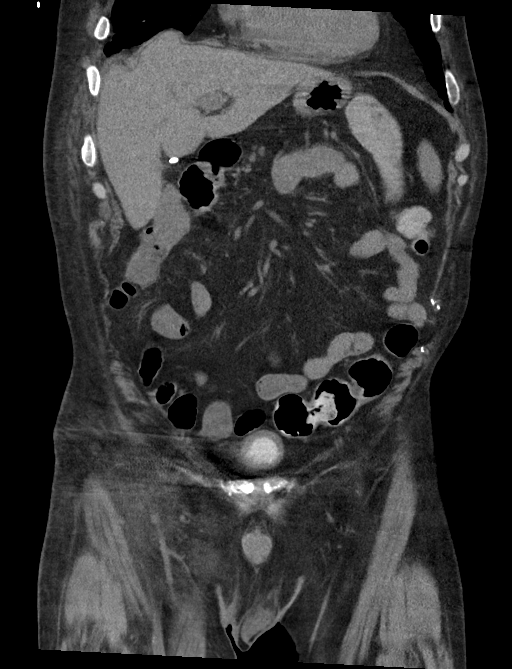
[im 44/98  soft-tissue]
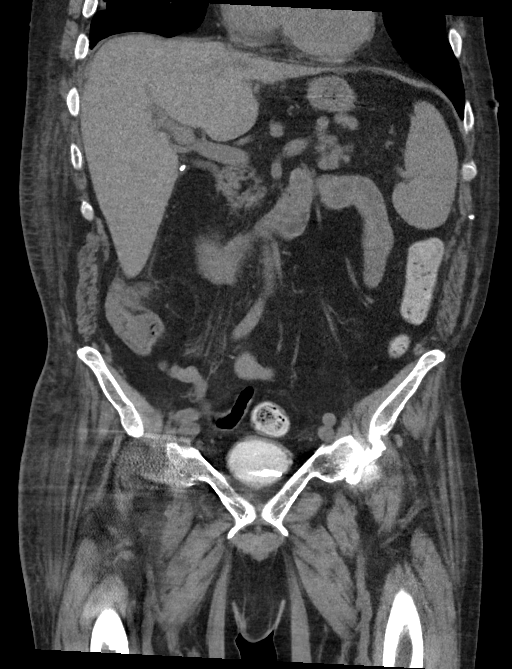
[im 54/98  soft-tissue]
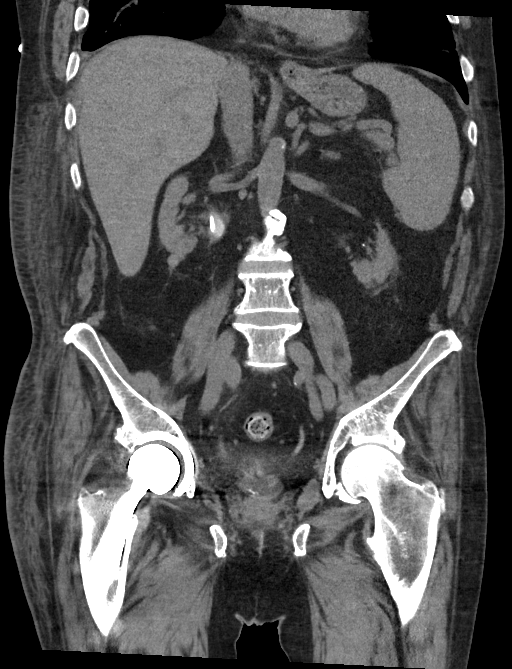

[15 of 46 positions shown; findings below may reference images not displayed]

FINDINGS: Lower chest: The heart remains borderline enlarged. No significant
change in small bilateral pleural effusions and bilateral lower lobe
atelectasis. Stable old, healed right lower anterior rib fractures.

Hepatobiliary: No focal liver abnormality is seen. Status post
cholecystectomy. No biliary dilatation.

Pancreas: Stable moderate diffuse pancreatic atrophy.

Spleen: Stable borderline enlarged spleen.

Adrenals/Urinary Tract: Stable mild-to-moderate dilatation of the
right renal collecting system with a right ureteral stent in
satisfactory position. Retained excreted contrast in the right renal
collecting system with mild mucosal edema.

Stable multiple bilateral renal calculi.

Normal appearing adrenal glands, left ureter and urinary bladder.

Stomach/Bowel: Stomach is within normal limits. Appendix appears
normal. No evidence of bowel wall thickening, distention, or
inflammatory changes.

Vascular/Lymphatic: No significant vascular findings are present. No
enlarged abdominal or pelvic lymph nodes.

Reproductive: Prostate gland grossly normal in size, partially
obscured by artifacts from a right hip prosthesis.

Other: Anterior abdominal wall surgical wires. Small bilateral
inguinal hernias containing fat.

Musculoskeletal: Right hip prosthesis with associated streak
artifacts. Stable approximately 10% T12 superior endplate
compression deformity and mild Schmorl's node formation. No acute
fracture lines. Minimal bony retropulsion.
IMPRESSION: 1. No retroperitoneal hemorrhage or other interval acute abnormality
seen.
2. Stable small bilateral pleural effusions and bilateral lower lobe
atelectasis.
3. Stable mild-to-moderate right hydronephrosis with a right
ureteral stent in satisfactory position.
4. Stable mucosal edema involving the right renal collecting system,
compatible with infection.
5. Stable bilateral renal calculi.

## 2020-10-27 IMAGING — MR MR MRA NECK WO/W CM
2 of 3 series · 28 of 48 positions shown · IV contrast (7ml Gadavist)
Comparison: None.

CLINICAL DATA: Stroke workup.

EXAM:
MRA NECK WITHOUT AND WITH CONTRAST
MRA HEAD WITHOUT CONTRAST
TECHNIQUE: Multiplanar and multiecho pulse sequences of the neck were obtained
without and with intravenous contrast. Angiographic images of the
neck were obtained using MRA technique without and with intravenous
contrast; Angiographic images of the Circle of Willis were obtained
using MRA technique without intravenous contrast.
CONTRAST:  7.5mL GADAVIST GADOBUTROL 1 MMOL/ML IV SOLN

[Series 10: angio_fl3d_cor_post_ttc=2.0s_moco-adv · coronal · B · 0.9mm · 0.85mm/px · 14 of 96 slices shown]
[im 1/96]
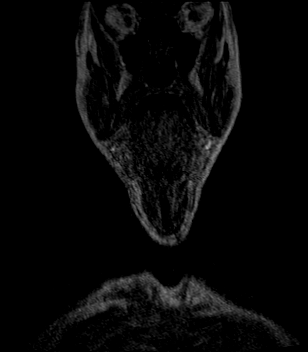
[im 8/96]
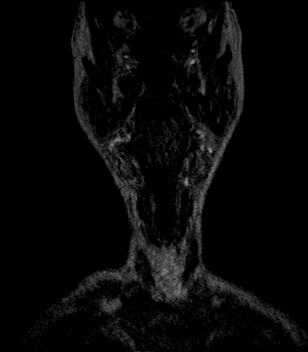
[im 15/96]
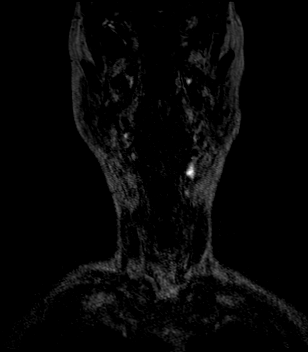
[im 22/96]
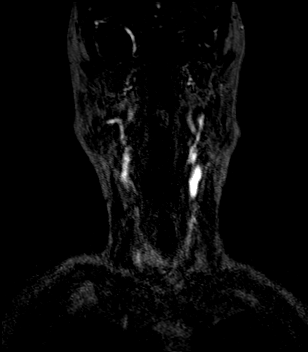
[im 30/96]
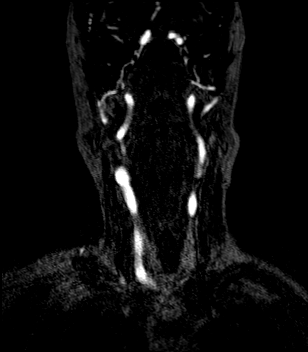
[im 37/96]
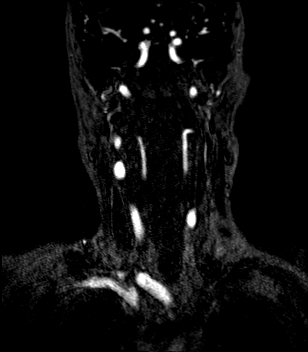
[im 44/96]
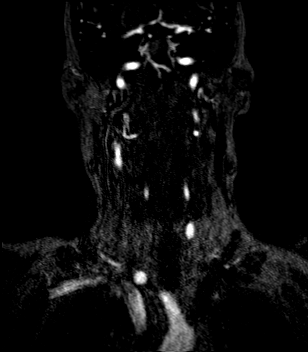
[im 52/96]
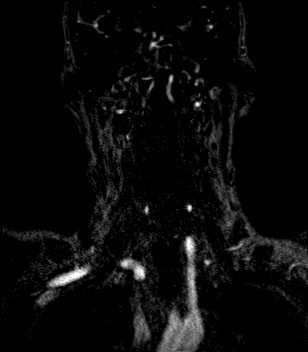
[im 59/96]
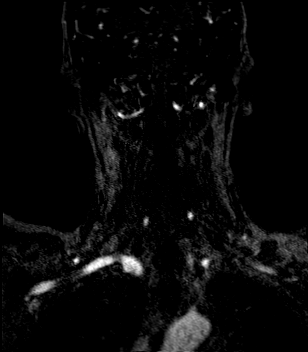
[im 66/96]
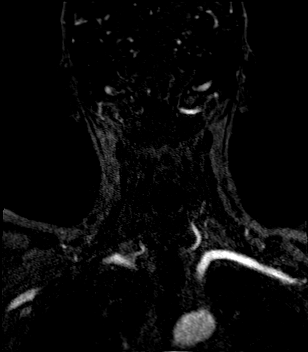
[im 74/96]
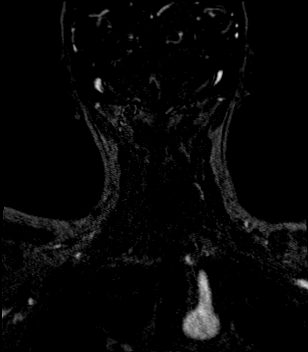
[im 81/96]
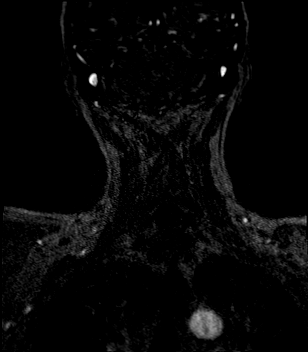
[im 88/96]
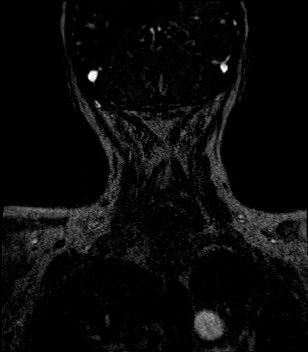
[im 96/96]
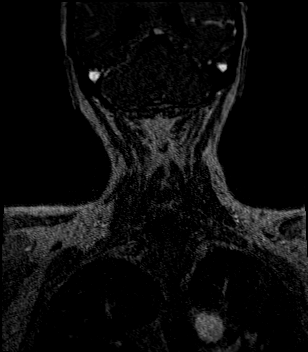

[Series 11: angio_fl3d_cor_post_ttc=2.0s_moco-adv_sub · coronal · B · 0.9mm · 0.85mm/px · 14 of 96 slices shown]
[im 1/96]
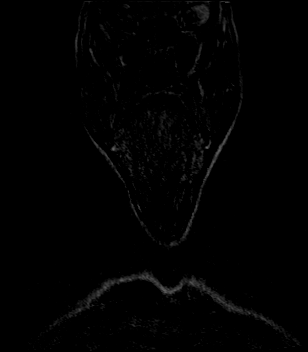
[im 8/96]
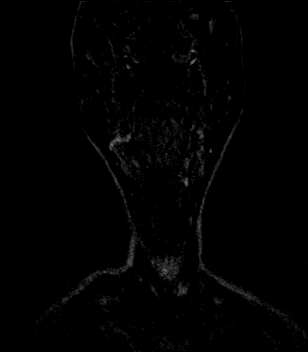
[im 15/96]
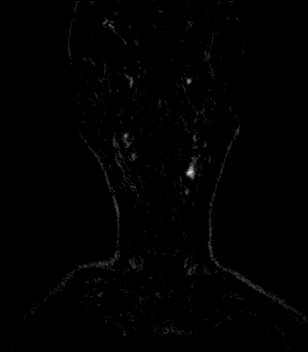
[im 22/96]
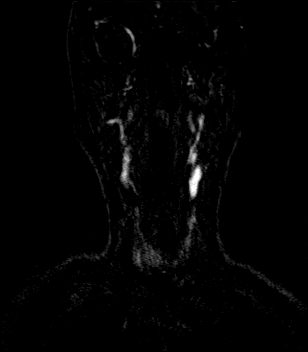
[im 30/96]
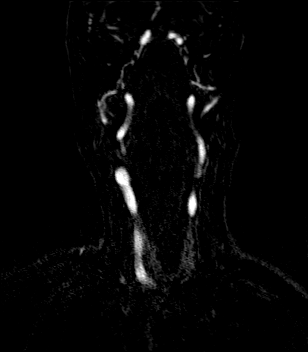
[im 37/96]
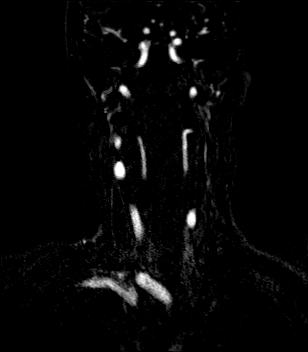
[im 44/96]
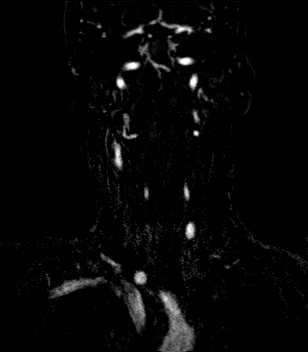
[im 52/96]
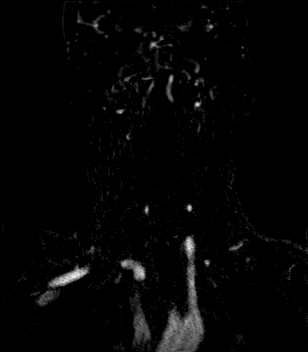
[im 59/96]
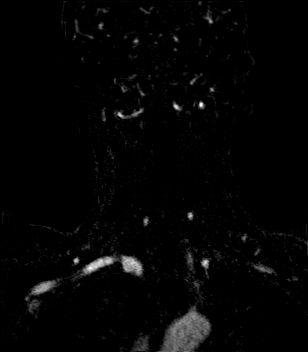
[im 66/96]
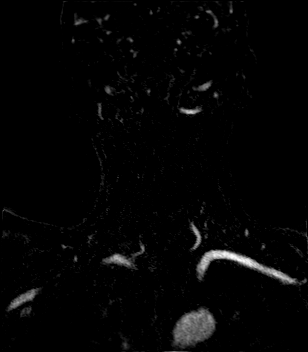
[im 74/96]
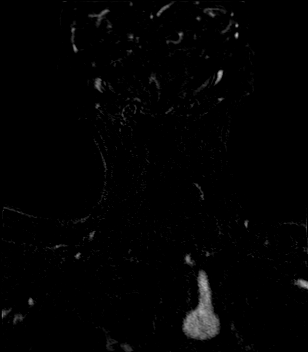
[im 81/96]
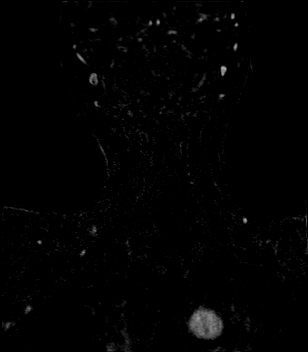
[im 88/96]
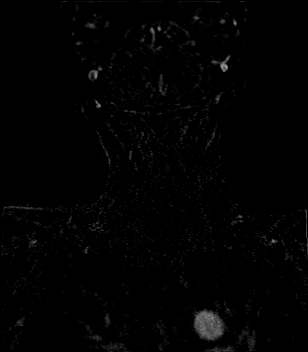
[im 96/96]
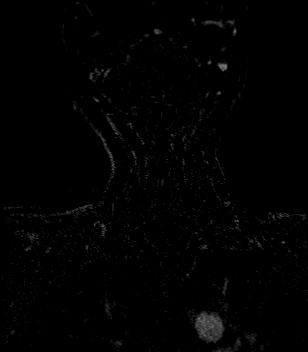

[28 of 48 positions shown; findings below may reference images not displayed]

FINDINGS: MRA NECK FINDINGS

Motion limited evaluation.

Aorta: Great vessel origins are patent.

Right carotid system: Patent without evidence of flow limiting
stenosis.

Left carotid system: Patent without evidence of flow limiting
stenosis.

Vertebral arteries: Left dominant. Patent without evidence of flow
limiting stenosis.

MRA HEAD FINDINGS

Anterior circulation: Bilateral ICAs, MCAs, and ACAs are patent
without proximal flow limiting stenosis. No aneurysm identified.

Posterior circulation: Bilateral intradural vertebral arteries,
basilar artery and posterior cerebral arteries are patent without
proximal flow limiting stenosis. Prominent bilateral posterior
communicating arteries. No aneurysm identified.
IMPRESSION: No evidence of a large vessel occlusion or proximal flow limiting
stenosis in the head or neck.

## 2020-10-27 MED ORDER — SODIUM CHLORIDE 0.9 % IV BOLUS
500.0000 mL | Freq: Once | INTRAVENOUS | Status: AC
  Administered 2020-10-27: 500 mL via INTRAVENOUS

## 2020-10-27 MED ORDER — GADOBUTROL 1 MMOL/ML IV SOLN
7.0000 mL | Freq: Once | INTRAVENOUS | Status: AC | PRN
  Administered 2020-10-27: 7.5 mL via INTRAVENOUS

## 2020-10-27 NOTE — Evaluation (Signed)
Occupational Therapy Evaluation Patient Details Name: Gordon Farrell MRN: [id number] DOB: February 12, 1946 Today's Date: 10/27/2020    History of Present Illness Gordon Farrell is a 75 y.o. male with medical history significant for atrial fibrillation, history of DVT, on Eliquis, hyperlipidemia, hypothyroid, BPH, pancytopenia, MGUS, chronic right upper back pain, presents to the emergency department for chief concerns of fall.   Clinical Impression   Gordon Farrell is very limited by pain today. He reports 3-4/10 in his mid-back while unmoving in bed, with pain shooting up to 8/10 with the slightest movement. He required Max A +3 for bed mobility, yelling out in pain as staff assisted him with toileting and linen change. He was able to provide limited assistance by using his UE to grip bed rails. RN requested therapist not attempt OOB mobility, given pt's level of pain and weakness. Pt in agreement, saying "I know my legs can't hold me up." Given pt's very limited fxl mobility, recommend DC to STR, where pt can work on improving his strength, balance, endurance, as well as receiving pain mgmt, to enable him to return to PLOF.    Follow Up Recommendations  SNF    Equipment Recommendations  Tub/shower seat;Other (comment) (bidet)    Recommendations for Other Services       Precautions / Restrictions Precautions Precautions: Fall Restrictions Weight Bearing Restrictions: No      Mobility Bed Mobility Overal bed mobility: Needs Assistance Bed Mobility: Rolling Rolling: Max assist         General bed mobility comments: +3 assist, 2/2 pain and very limited pt mobility    Transfers                 General transfer comment: unable    Balance Overall balance assessment: History of Falls                                         ADL either performed or assessed with clinical judgement   ADL Overall ADL's : Needs assistance/impaired                 Upper  Body Dressing : Modified independent;Set up Upper Body Dressing Details (indicate cue type and reason): donning gown         Toileting- Clothing Manipulation and Hygiene: Bed level;Maximal assistance;+2 for physical assistance Toileting - Clothing Manipulation Details (indicate cue type and reason): +3 assist for changing bedding             Vision Baseline Vision/History: Wears glasses Wears Glasses: At all times Patient Visual Report: No change from baseline       Perception     Praxis      Pertinent Vitals/Pain Pain Assessment: 0-10 Pain Score: 8  Pain Descriptors / Indicators: Stabbing;Shooting;Grimacing Pain Intervention(s): Limited activity within patient's tolerance;Patient requesting pain meds-RN notified;Monitored during session;Repositioned     Hand Dominance     Extremity/Trunk Assessment Upper Extremity Assessment Upper Extremity Assessment: Generalized weakness   Lower Extremity Assessment Lower Extremity Assessment: Generalized weakness;LLE deficits/detail;RLE deficits/detail RLE Sensation: decreased light touch;history of peripheral neuropathy;decreased proprioception LLE Sensation: decreased light touch;history of peripheral neuropathy;decreased proprioception       Communication Communication Communication: HOH (has hearing aids, although did not bring them to hospital)   Cognition Arousal/Alertness: Awake/alert Behavior During Therapy: WFL for tasks assessed/performed Overall Cognitive Status: Within Functional Limits for tasks assessed  General Comments: A&O x 4   General Comments  neuropathy, b/l LE; edema, R LE, extensive bruising, R UE    Exercises Other Exercises Other Exercises: Bed mobility, bed level toileting, UB dressing. Educ re: AE for ADL performance   Shoulder Instructions      Home Living Family/patient expects to be discharged to:: Private residence Living Arrangements:  Spouse/significant other Available Help at Discharge: Family Type of Home: Apartment Home Access: Level entry     Home Layout: One level     Bathroom Shower/Tub: Occupational psychologist: Standard     Home Equipment: None          Prior Functioning/Environment Level of Independence: Needs assistance  Gait / Transfers Assistance Needed: ambulating with AD, although reporting he often feels his "equilibrium is way off" ADL's / Homemaking Assistance Needed: Pt drives, assists with shopping. Wife does housework, cooking. Pt able to perform his own ADL            OT Problem List: Decreased strength;Decreased knowledge of use of DME or AE;Increased edema;Decreased range of motion;Decreased coordination;Decreased activity tolerance;Pain;Impaired balance (sitting and/or standing)      OT Treatment/Interventions: Self-care/ADL training;Therapeutic exercise;Patient/family education;Balance training;Energy conservation;DME and/or AE instruction;Therapeutic activities    OT Goals(Current goals can be found in the care plan section) Acute Rehab OT Goals Patient Stated Goal: to feel better OT Goal Formulation: With patient Time For Goal Achievement: 11/10/20 Potential to Achieve Goals: Good ADL Goals Pt Will Transfer to Toilet: with min assist;stand pivot transfer;regular height toilet Pt Will Perform Toileting - Clothing Manipulation and hygiene: with supervision;sitting/lateral leans (with AE as needed) Pt Will Perform Tub/Shower Transfer: shower seat;Stand pivot transfer;with supervision  OT Frequency: Min 1X/week   Barriers to D/C:            Co-evaluation              AM-PAC OT "6 Clicks" Daily Activity     Outcome Measure Help from another person eating meals?: A Little Help from another person taking care of personal grooming?: A Little Help from another person toileting, which includes using toliet, bedpan, or urinal?: A Lot Help from another person  bathing (including washing, rinsing, drying)?: A Lot Help from another person to put on and taking off regular upper body clothing?: A Little Help from another person to put on and taking off regular lower body clothing?: A Lot 6 Click Score: 15   End of Session Nurse Communication: Patient requests pain meds  Activity Tolerance: Patient limited by pain;Patient tolerated treatment well Patient left: in bed;with call bell/phone within reach;with bed alarm set  OT Visit Diagnosis: Unsteadiness on feet (R26.81);History of falling (Z91.81);Other abnormalities of gait and mobility (R26.89);Muscle weakness (generalized) (M62.81);Pain                Time: 8088-1103 OT Time Calculation (min): 26 min Charges:  OT General Charges $OT Visit: 1 Visit OT Evaluation $OT Eval Moderate Complexity: 1 Mod OT Treatments $Self Care/Home Management : 23-37 mins Josiah Lobo, PhD, MS, OTR/L 10/27/20, 2:21 PM

## 2020-10-27 NOTE — Care Management Obs Status (Signed)
[facility] NOTIFICATION   Patient Details  Name: Gordon Farrell MRN: [id number] Date of Birth: 30-Dec-1945   Medicare Observation Status Notification Given:  Yes    Candie Chroman, LCSW 10/27/2020, 3:36 PM

## 2020-10-27 NOTE — Progress Notes (Signed)
Patient request prayer. Upon arrival Gordon Farrell was watching television. Reported wife and local pastor visited earlier in the day. Chaplain aided him in understanding his next steps of needing rehab which will be his first stay in rehab. Chaplain explored concerns with him. He shared he has prayed more in the past year (raised Catholic, finds [place], [employer] helpful) to aid in comfort. He shared of his complex health issues (most recently back pain) that seems to be building. He longs for improved connection with his children. He does not understand what happened with the disconnect with his son three years ago. Chaplain explored was of engaging children through curiosity and openness. Gordon Farrell was waiting his supper and took initiative during meal to ensure his meal arrived. Chaplain aided him in getting packages open. Offered prayer. Gordon Farrell was thankful for prayer. Chaplain reminded him spiritual care supportive is available 24/7 upon request.     10/27/20 1900  Clinical Encounter Type  Visited With Patient  Visit Type Initial  Referral From Nurse  Spiritual Encounters  Spiritual Needs Prayer  Stress Factors  Patient Stress Factors Health changes;Loss of control

## 2020-10-27 NOTE — Progress Notes (Signed)
Agree with holding eliquis as has right groin hematoma. May consider ct abdomen/pelvis if hemoglobin drops further.

## 2020-10-27 NOTE — NC FL2 (Signed)
[place] LEVEL OF CARE SCREENING TOOL     IDENTIFICATION  Patient Name: Gordon Farrell Birthdate: [birth year] Sex: male Admission Date (Current Location): 10/26/2020  [facility] and [place] Number:  Engineering geologist and Address:  [facility], [address] [place], [place] [address]      Provider Number: [id number]  Attending Physician Name and Address:  Alma Friendly, MD  Relative Name and Phone Number:       Current Level of Care: Hospital Recommended Level of Care: [place] Prior Approval Number:    Date Approved/Denied:   PASRR Number: [id number]  Discharge Plan: SNF    Current Diagnoses: Patient Active Problem List   Diagnosis Date Noted   Symptomatic anemia 10/26/2020   Unspecified atrial fibrillation ([place]) 10/21/2020   Acute CHF (congestive heart failure) ([place]) 10/21/2020   Dysphagia 10/21/2020   Chest pain 10/20/2020   Acquired hypothyroidism 03/08/2015   Benign prostatic hyperplasia with urinary obstruction 03/08/2015   Compression fracture of lumbar vertebra ([place]) 03/08/2015   Crohn's disease of large intestine ([place]) 03/08/2015   Deep vein thrombosis (DVT) ([place]) 03/08/2015   Psychogenic cyclical vomiting 93/26/7124    Orientation RESPIRATION BLADDER Height & Weight     Self, Time, Situation, Place  Normal Continent Weight: 184 lb (83.5 kg) Height:  5\' 8"  (172.7 cm)  BEHAVIORAL SYMPTOMS/MOOD NEUROLOGICAL BOWEL NUTRITION STATUS   (None)  (None) Continent Diet (Heart healthy)  AMBULATORY STATUS COMMUNICATION OF NEEDS Skin   Extensive Assist Verbally Skin abrasions, Bruising                       Personal Care Assistance Level of Assistance  Bathing, Feeding, Dressing Bathing Assistance: Maximum assistance Feeding assistance: Limited assistance Dressing Assistance: Maximum assistance     Functional Limitations Info  Sight, Hearing, Speech Sight Info: Adequate Hearing Info:  Adequate Speech Info: Adequate    SPECIAL CARE FACTORS FREQUENCY  PT (By licensed PT), OT (By licensed OT)     PT Frequency: 5 x week OT Frequency: 5 x week            Contractures Contractures Info: Not present    Additional Factors Info  Code Status, Allergies Code Status Info: Full code Allergies Info: NKDA           Current Medications (10/27/2020):  This is the current hospital active medication list Current Facility-Administered Medications  Medication Dose Route Frequency Provider Last Rate Last Admin   acetaminophen (TYLENOL) tablet 650 mg  650 mg Oral Q6H PRN Cox, Amy N, DO   650 mg at 10/26/20 1444   Or   acetaminophen (TYLENOL) suppository 650 mg  650 mg Rectal Q6H PRN Cox, Amy N, DO       atorvastatin (LIPITOR) tablet 40 mg  40 mg Oral QHS Cox, Amy N, DO   40 mg at 10/27/20 0026   finasteride (PROSCAR) tablet 5 mg  5 mg Oral Daily Cox, Amy N, DO   5 mg at 10/27/20 0914   gabapentin (NEURONTIN) capsule 100 mg  100 mg Oral TID Cox, Amy N, DO   100 mg at 10/27/20 0914   levothyroxine (SYNTHROID) tablet 100 mcg  100 mcg Oral Q0600 Cox, Amy N, DO   100 mcg at 10/27/20 0537   morphine 2 MG/ML injection 2 mg  2 mg Intravenous Q4H PRN Cox, Amy N, DO   2 mg at 10/27/20 1448   ondansetron (ZOFRAN) tablet 4 mg  4 mg Oral Q6H PRN Cox, Amy N, DO       Or   ondansetron (ZOFRAN) injection 4 mg  4 mg Intravenous Q6H PRN Cox, Amy N, DO   4 mg at 10/26/20 2014   pantoprazole (PROTONIX) EC tablet 40 mg  40 mg Oral Daily Cox, Amy N, DO   40 mg at 10/27/20 0914   pilocarpine (SALAGEN) tablet 5 mg  5 mg Oral TID Cox, Amy N, DO   5 mg at 10/27/20 1314   pyridOXINE (VITAMIN B-6) tablet 100 mg  100 mg Oral Daily Cox, Amy N, DO   100 mg at 10/27/20 4327   sertraline (ZOLOFT) tablet 100 mg  100 mg Oral Daily Cox, Amy N, DO   100 mg at 10/27/20 0914   timolol (TIMOPTIC) 0.5 % ophthalmic solution 1 drop  1 drop Right Eye BID Cox, Amy N, DO   1 drop at 10/27/20 6147   traZODone (DESYREL)  tablet 100 mg  100 mg Oral QHS Cox, Amy N, DO   100 mg at 10/27/20 0027     Discharge Medications: Please see discharge summary for a list of discharge medications.  Relevant Imaging Results:  Relevant Lab Results:   Additional Information SS#: [id number]. Patient says he's had 4 vaccines (Moderna) and that his wife can bring his card.  Candie Chroman, LCSW

## 2020-10-27 NOTE — Progress Notes (Signed)
PROGRESS NOTE  Gordon Farrell WCB:[id number] DOB: 30-Oct-1945 DOA: 10/26/2020 PCP: Danae Orleans, MD  HPI/Recap of past 24 hours: Gordon Farrell is a 75 y.o. male with medical history significant for atrial fibrillation, history of DVT, on Eliquis, hyperlipidemia, hypothyroid, BPH, pancytopenia, MGUS, chronic right upper back pain, presents to the ED for chief concerns of fall. Pt reports that PTA, his legs became so weak and wobbly.  He endorses that he fell in the a.m. on day of admission.  He denies light head trauma and loss of consciousness.  He denies seizure activity. He reports he fell in the hospital. In the ED, vita signs stable, labs showed hemoglobin of 7.7 lower than patient's baseline of around 8, labs fairly stable.  COVID/influenza were negative.  Patient admitted for further management.    Today, patient reports generalized weakness especially on bilateral lower extremities with some numbness.  Patient denies any chest pain, shortness of breath, abdominal pain, nausea/vomiting, fever/chills.    Assessment/Plan: Principal Problem:   Symptomatic anemia Active Problems:   Acquired hypothyroidism   Benign prostatic hyperplasia with urinary obstruction   Compression fracture of lumbar vertebra (HCC)   Crohn's disease of large intestine (HCC)   Deep vein thrombosis (DVT) (HCC)   Unspecified atrial fibrillation (HCC)   Acute small ischemic cortical infarct Noted to have bilateral lower extremity weakness, falls, syncope MRI brain showed above Echo 10/21/20 with EF of 55-60% MRA H&N pending A1c, lipid panel pending Neurology consulted, appreciate recs Continue to hold Eliquis for now due to groin hematoma PT/OT Telemetry  Symptomatic anemia likely 2/2 R groin hematoma present on admission Likely 2/2 recent left heart cath on 10/23/2020 Hgb 7.7 on admission, baseline around 8.1-8.4 Ultrasound ordered by EDP showed right groin hematoma measuring 7.2 cm and no evidence of  pseudoaneurysm or AV fistula in the right groin S/p 2 unit of PRBC given on 10/26/20 Monitor CBC Discussed with Dr Humphrey Rolls cardiologist about hematoma, rec CT pelvis to r/o retroperitoneal hematoma as well CT pelvis ordered   P. Atrial fibrillation Eliquis is being held at this time due to acute anemia and hematoma Restart once able   BLE numbness, chronic Chronic low back pain Noted urinary incontinence  MRI of the lumbar unremarkable Pain management   History of chronic leukopenia and thrombocytopenia At baseline, outpatient followup  History of DVT Holding eliquis at this time  Hypothyroidism Resumed levothyroxine  Depression/anxiety/insomnia Sertraline, trazodone         Estimated body mass index is 27.98 kg/m as calculated from the following:   Height as of this encounter: 5\' 8"  (1.727 m).   Weight as of this encounter: 83.5 kg.     Code Status: full  Family Communication: None at bedside  Disposition Plan: Status is: Observation  The patient will require care spanning > 2 midnights and should be moved to inpatient because: Inpatient level of care appropriate due to severity of illness  Dispo: The patient is from: Home              Anticipated d/c is to: SNF              Patient currently is not medically stable to d/c.   Difficult to place patient No     Consultants: Neurology   Procedures: None  Antimicrobials: None  DVT prophylaxis: SCD due to hematoma   Objective: Vitals:   10/26/20 2145 10/27/20 0019 10/27/20 0444 10/27/20 0754  BP: (!) 105/48 (!) 103/47 (!) 94/46 (!) 100/48  Pulse: 70 79 72 75  Resp: 18 18 20 18   Temp: (!) 97.5 F (36.4 C) 98.9 F (37.2 C) 98 F (36.7 C) 98.4 F (36.9 C)  TempSrc: Oral Oral Oral   SpO2: 100% 100% 96% 97%  Weight:      Height:        Intake/Output Summary (Last 24 hours) at 10/27/2020 1441 Last data filed at 10/27/2020 1300 Gross per 24 hour  Intake 619.1 ml  Output 350 ml  Net 269.1 ml    Filed Weights   10/26/20 0600  Weight: 83.5 kg    Exam: General: NAD, deconditioned Cardiovascular: S1, S2 present Respiratory: CTAB Abdomen: Soft, nontender, nondistended, bowel sounds present, right groin hematoma noted, tender to palpate Musculoskeletal: No bilateral pedal edema noted, right foot swelling Skin: Normal Psychiatry: Normal mood  Neurology: BUE 5/5 strength, BLE 4/5, sensation intact   Data Reviewed: CBC: Recent Labs  Lab 10/20/20 1949 10/22/20 0140 10/23/20 0218 10/26/20 0601 10/26/20 1558 10/26/20 2158 10/27/20 0635  WBC 2.3* 1.3* 1.1* 1.6* 1.8*  --  2.3*  NEUTROABS 1.4*  --   --  0.8*  --   --   --   HGB 8.4* 8.4* 8.1* 7.7* 7.8* 8.7* 7.9*  HCT 25.4* 25.7* 24.4* 22.7* 23.0* 25.9* 22.7*  MCV 111.9* 113.2* 112.4* 111.3* 106.5*  --  103.7*  PLT 170 184 203 198 165  --  025   Basic Metabolic Panel: Recent Labs  Lab 10/20/20 1949 10/22/20 0140 10/23/20 0218 10/26/20 0601 10/27/20 0635  NA 133* 142 140 137 135  K 3.7 3.4* 3.5 3.1* 3.6  CL 106 113* 111 109 107  CO2 22 24 23 22  21*  GLUCOSE 116* 107* 97 110* 102*  BUN 16 18 12 13 20   CREATININE 1.08 1.05 0.94 1.06 1.21  CALCIUM 8.1* 8.0* 7.7* 7.9* 7.7*  MG  --  2.0 2.0 1.7  --    GFR: Estimated Creatinine Clearance: 55.5 mL/min (by C-G formula based on SCr of 1.21 mg/dL). Liver Function Tests: Recent Labs  Lab 10/20/20 1949 10/26/20 0601  AST 24 28  ALT 13 19  ALKPHOS 81 73  BILITOT 1.6* 1.3*  PROT 5.8* 5.9*  ALBUMIN 2.7* 2.6*   Recent Labs  Lab 10/20/20 1949  LIPASE 28   No results for input(s): AMMONIA in the last 168 hours. Coagulation Profile: Recent Labs  Lab 10/21/20 0219 10/23/20 0218 10/26/20 0601  INR 1.8* 1.2 1.5*   Cardiac Enzymes: No results for input(s): CKTOTAL, CKMB, CKMBINDEX, TROPONINI in the last 168 hours. BNP (last 3 results) No results for input(s): PROBNP in the last 8760 hours. HbA1C: No results for input(s): HGBA1C in the last 72  hours. CBG: No results for input(s): GLUCAP in the last 168 hours. Lipid Profile: No results for input(s): CHOL, HDL, LDLCALC, TRIG, CHOLHDL, LDLDIRECT in the last 72 hours. Thyroid Function Tests: No results for input(s): TSH, T4TOTAL, FREET4, T3FREE, THYROIDAB in the last 72 hours. Anemia Panel: No results for input(s): VITAMINB12, FOLATE, FERRITIN, TIBC, IRON, RETICCTPCT in the last 72 hours. Urine analysis:    Component Value Date/Time   COLORURINE AMBER (A) 10/26/2020 0811   APPEARANCEUR CLOUDY (A) 10/26/2020 0811   LABSPEC 1.017 10/26/2020 0811   PHURINE 5.0 10/26/2020 0811   GLUCOSEU NEGATIVE 10/26/2020 0811   HGBUR LARGE (A) 10/26/2020 0811   BILIRUBINUR NEGATIVE 10/26/2020 0811   KETONESUR NEGATIVE 10/26/2020 0811   PROTEINUR 100 (A) 10/26/2020 0811   NITRITE NEGATIVE 10/26/2020 8527  LEUKOCYTESUR NEGATIVE 10/26/2020 0811   Sepsis Labs: @LABRCNTIP (procalcitonin:4,lacticidven:4)  ) Recent Results (from the past 240 hour(s))  Resp Panel by RT-PCR (Flu A&B, Covid) Nasopharyngeal Swab     Status: None   Collection Time: 10/20/20  8:11 PM   Specimen: Nasopharyngeal Swab; Nasopharyngeal(NP) swabs in vial transport medium  Result Value Ref Range Status   SARS Coronavirus 2 by RT PCR NEGATIVE NEGATIVE Final    Comment: (NOTE) SARS-CoV-2 target nucleic acids are NOT DETECTED.  The SARS-CoV-2 RNA is generally detectable in upper respiratory specimens during the acute phase of infection. The lowest concentration of SARS-CoV-2 viral copies this assay can detect is 138 copies/mL. A negative result does not preclude SARS-Cov-2 infection and should not be used as the sole basis for treatment or other patient management decisions. A negative result may occur with  improper specimen collection/handling, submission of specimen other than nasopharyngeal swab, presence of viral mutation(s) within the areas targeted by this assay, and inadequate number of viral copies(<138  copies/mL). A negative result must be combined with clinical observations, patient history, and epidemiological information. The expected result is Negative.  Fact Sheet for Patients:  EntrepreneurPulse.com.au  Fact Sheet for Healthcare Providers:  IncredibleEmployment.be  This test is no t yet approved or cleared by the [place] FDA and  has been authorized for detection and/or diagnosis of SARS-CoV-2 by FDA under an Emergency Use Authorization (EUA). This EUA will remain  in effect (meaning this test can be used) for the duration of the COVID-19 declaration under Section 564(b)(1) of the Act, 21 U.S.C.section 360bbb-3(b)(1), unless the authorization is terminated  or revoked sooner.       Influenza A by PCR NEGATIVE NEGATIVE Final   Influenza B by PCR NEGATIVE NEGATIVE Final    Comment: (NOTE) The Xpert Xpress SARS-CoV-2/FLU/RSV plus assay is intended as an aid in the diagnosis of influenza from Nasopharyngeal swab specimens and should not be used as a sole basis for treatment. Nasal washings and aspirates are unacceptable for Xpert Xpress SARS-CoV-2/FLU/RSV testing.  Fact Sheet for Patients: EntrepreneurPulse.com.au  Fact Sheet for Healthcare Providers: IncredibleEmployment.be  This test is not yet approved or cleared by the [place] FDA and has been authorized for detection and/or diagnosis of SARS-CoV-2 by FDA under an Emergency Use Authorization (EUA). This EUA will remain in effect (meaning this test can be used) for the duration of the COVID-19 declaration under Section 564(b)(1) of the Act, 21 U.S.C. section 360bbb-3(b)(1), unless the authorization is terminated or revoked.  Performed at [facility], [address] [place], [place] [address]   Resp Panel by RT-PCR (Flu A&B, Covid) Nasopharyngeal Swab     Status: None   Collection Time: 10/26/20  6:01 AM   Specimen:  Nasopharyngeal Swab; Nasopharyngeal(NP) swabs in vial transport medium  Result Value Ref Range Status   SARS Coronavirus 2 by RT PCR NEGATIVE NEGATIVE Final    Comment: (NOTE) SARS-CoV-2 target nucleic acids are NOT DETECTED.  The SARS-CoV-2 RNA is generally detectable in upper respiratory specimens during the acute phase of infection. The lowest concentration of SARS-CoV-2 viral copies this assay can detect is 138 copies/mL. A negative result does not preclude SARS-Cov-2 infection and should not be used as the sole basis for treatment or other patient management decisions. A negative result may occur with  improper specimen collection/handling, submission of specimen other than nasopharyngeal swab, presence of viral mutation(s) within the areas targeted by this assay, and inadequate number of viral copies(<138 copies/mL). A negative  result must be combined with clinical observations, patient history, and epidemiological information. The expected result is Negative.  Fact Sheet for Patients:  EntrepreneurPulse.com.au  Fact Sheet for Healthcare Providers:  IncredibleEmployment.be  This test is no t yet approved or cleared by the [place] FDA and  has been authorized for detection and/or diagnosis of SARS-CoV-2 by FDA under an Emergency Use Authorization (EUA). This EUA will remain  in effect (meaning this test can be used) for the duration of the COVID-19 declaration under Section 564(b)(1) of the Act, 21 U.S.C.section 360bbb-3(b)(1), unless the authorization is terminated  or revoked sooner.       Influenza A by PCR NEGATIVE NEGATIVE Final   Influenza B by PCR NEGATIVE NEGATIVE Final    Comment: (NOTE) The Xpert Xpress SARS-CoV-2/FLU/RSV plus assay is intended as an aid in the diagnosis of influenza from Nasopharyngeal swab specimens and should not be used as a sole basis for treatment. Nasal washings and aspirates are unacceptable for  Xpert Xpress SARS-CoV-2/FLU/RSV testing.  Fact Sheet for Patients: EntrepreneurPulse.com.au  Fact Sheet for Healthcare Providers: IncredibleEmployment.be  This test is not yet approved or cleared by the [place] FDA and has been authorized for detection and/or diagnosis of SARS-CoV-2 by FDA under an Emergency Use Authorization (EUA). This EUA will remain in effect (meaning this test can be used) for the duration of the COVID-19 declaration under Section 564(b)(1) of the Act, 21 U.S.C. section 360bbb-3(b)(1), unless the authorization is terminated or revoked.  Performed at [facility], [address] [place], [place] [address]   Blood culture (routine single)     Status: None (Preliminary result)   Collection Time: 10/26/20  8:11 AM   Specimen: BLOOD  Result Value Ref Range Status   Specimen Description BLOOD RIGHT [facility]  Final   Special Requests   Final    BOTTLES DRAWN AEROBIC AND ANAEROBIC Blood Culture adequate volume   Culture   Final    NO GROWTH < 24 HOURS Performed at [facility], [address] [place], [place] [address]    Report Status PENDING  Incomplete  Urine culture     Status: Abnormal (Preliminary result)   Collection Time: 10/26/20  8:11 AM   Specimen: In/Out Cath Urine  Result Value Ref Range Status   Specimen Description   Final    IN/OUT CATH URINE Performed at [facility], [address] [place], [place] [address]    Special Requests   Final    NONE Performed at [facility], [address] [place], [place] [address]    Culture (A)  Final    6,000 COLONIES/mL GRAM NEGATIVE RODS 7,000 COLONIES/mL ENTEROCOCCUS FAECALIS SUSCEPTIBILITIES TO FOLLOW Performed at [facility], [place] [address], [place], [place] [address]    Report Status PENDING  Incomplete      Studies: MR BRAIN WO CONTRAST  Result Date: 10/27/2020 CLINICAL DATA:  Initial evaluation for  syncope, fall. EXAM: MRI HEAD WITHOUT CONTRAST TECHNIQUE: Multiplanar, multiecho pulse sequences of the brain and surrounding structures were obtained without intravenous contrast. COMPARISON:  Prior CT from earlier the same day. FINDINGS: Brain: Diffuse prominence of the CSF containing spaces compatible generalized cerebral atrophy. Mild chronic microvascular ischemic disease noted involving the periventricular white matter. Punctate 3 mm focus of restricted diffusion seen involving the cortex of the posterior left temporal occipital region (series 5, image 22). Finding consistent with a small acute ischemic infarct. No blood products seen within this region on  corresponding SWI sequence or prior CT. No mass effect. No other diffusion abnormality to suggest acute or subacute ischemia. Apparent vague diffusion signal overlying the frontal convexities on axial DWI sequence favored to be artifactual in nature. Gray-white matter differentiation maintained. No encephalomalacia to suggest chronic cortical infarction. No other evidence for acute or chronic intracranial hemorrhage. No mass lesion, midline shift or mass effect. No hydrocephalus or extra-axial fluid collection. Partially empty sella noted. Suprasellar region normal. Midline structures intact. Vascular: Major intracranial vascular flow voids are maintained. Skull and upper cervical spine: Craniocervical junction within normal limits. Bone marrow signal intensity normal. Probable small contusion noted at the left parietal scalp. Sinuses/Orbits: Sequelae of prior bilateral ocular lens replacement. Globes and orbital soft tissues demonstrate no acute finding. Paranasal sinuses are clear. Small right mastoid effusion noted, of doubtful significance. Inner ear structures grossly normal. Visualized nasopharynx unremarkable. Other: None. IMPRESSION: 1. 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  2. No other acute intracranial abnormality. 3. Mild age-related cerebral atrophy with chronic small vessel ischemic disease. 4. Small left parietal scalp contusion. Electronically Signed   By: Jeannine Boga M.D.   On: 10/27/2020 00:39   MR Lumbar Spine W Wo Contrast  Result Date: 10/27/2020 CLINICAL DATA:  Initial evaluation for numbness and tingling, recent fall. Evaluate for stenosis. EXAM: MRI LUMBAR SPINE WITHOUT AND WITH CONTRAST TECHNIQUE: Multiplanar and multiecho pulse sequences of the lumbar spine were obtained without and with intravenous contrast. CONTRAST:  71mL GADAVIST GADOBUTROL 1 MMOL/ML IV SOLN COMPARISON:  Prior CT from 10/26/2020. FINDINGS: Segmentation: Standard. Lowest well-formed disc space labeled the L5-S1 level. Alignment: Trace retrolisthesis of L4 on L5, with 2 mm anterolisthesis of L5 on S1. Findings chronic and facet mediated. Alignment otherwise normal preservation of the normal lumbar lordosis. Vertebrae: Chronic T12 compression fracture, partially visualized. Vertebral body height otherwise maintained with no other acute or chronic fracture. Bone marrow signal intensity within normal limits. No discrete or worrisome osseous lesions. No abnormal marrow edema or enhancement. Conus medullaris and cauda equina: Conus extends to the L1-2 level. Conus and cauda equina appear normal. Paraspinal and other soft tissues: Paraspinous soft tissues demonstrate no acute finding. Few scattered benign appearing cyst noted about the kidneys, better evaluated on prior CT. Visualized visceral structures otherwise unremarkable. Disc levels: L1-2: Negative interspace. Mild facet hypertrophy. No stenosis or impingement. L2-3: Mild anterior endplate spurring without significant disc bulge. Mild facet hypertrophy. No canal or foraminal stenosis. L3-4: Disc desiccation with mild annular disc bulge. Mild facet hypertrophy. No canal or foraminal stenosis. No impingement. L4-5: Mild disc bulge with  disc desiccation. Moderate left worse than right facet hypertrophy. Superimposed tiny 4 mm cystic lesion noted at the left ligamentum flavum, which could reflect a small synovial cyst versus cystic ligamentous degeneration (series 8, image 29). Resultant mild narrowing of the lateral recesses bilaterally. Central canal remains patent. Mild left L4 foraminal narrowing. Right neural foramina remains patent. L5-S1: Trace anterolisthesis. Degenerative intervertebral disc space narrowing with disc desiccation and mild disc bulge. Moderate bilateral facet hypertrophy. No canal or lateral recess stenosis. Mild right L5 foraminal narrowing. Left neural foramen remains patent. No frank impingement. IMPRESSION: 1. No evidence for acute injury related to recent fall. 2. Mild disc bulging with moderate facet hypertrophy at L4-5, resulting in mild bilateral lateral recess stenosis with mild left L4 foraminal narrowing. 3. Trace anterolisthesis of L5 on S1 with associated mild disc bulge and moderate facet hypertrophy, resulting in mild  right L5 foraminal stenosis. No other significant stenosis or neural impingement within the lumbar spine. 4. Chronic T12 compression fracture, partially visualized. Electronically Signed   By: Jeannine Boga M.D.   On: 10/27/2020 01:02   DG Chest Port 1 View  Result Date: 10/26/2020 CLINICAL DATA:  Shortness of breath fell in bathroom EXAM: PORTABLE CHEST 1 VIEW COMPARISON:  10/26/2020 FINDINGS: Clips over the right chest. Stable cardiomediastinal silhouette. Decreased vascular congestion compared to prior. Streaky atelectasis at the bases. No pneumothorax. IMPRESSION: Streaky atelectasis at the bases. Decreased vascular congestion compared to prior. Electronically Signed   By: Donavan Foil M.D.   On: 10/26/2020 15:41   [place] RT LOWER EXTREM LTD SOFT TISSUE NON VASCULAR  Result Date: 10/26/2020 CLINICAL DATA:  Right groin hematoma EXAM: ULTRASOUND right LOWER EXTREMITY LIMITED  TECHNIQUE: Ultrasound examination of the lower extremity soft tissues was performed in the area of clinical concern. COMPARISON:  10/27/2018 FINDINGS: Targeted ultrasound of the right groin performed in the region of previously demonstrated groin hematoma. Heterogeneous hypo and hyperechoic mass in the right groin, this measures 5.9 x 5 x 7.2 cm, previous measurements of 6.2 x 4.5 x 7.2 cm. This is grossly stable in size IMPRESSION: Grossly stable 7.2 cm right groin hematoma. Electronically Signed   By: Donavan Foil M.D.   On: 10/26/2020 18:25    Scheduled Meds:  atorvastatin  40 mg Oral QHS   finasteride  5 mg Oral Daily   gabapentin  100 mg Oral TID   levothyroxine  100 mcg Oral Q0600   pantoprazole  40 mg Oral Daily   pilocarpine  5 mg Oral TID   pyridoxine  100 mg Oral Daily   sertraline  100 mg Oral Daily   timolol  1 drop Right Eye BID   traZODone  100 mg Oral QHS    Continuous Infusions:   LOS: 0 days     Alma Friendly, MD [facility]  If 7PM-7AM, please contact night-coverage [email or web address] 10/27/2020, 2:41 PM

## 2020-10-27 NOTE — Plan of Care (Signed)
  Problem: Education: Goal: Knowledge of General Education information will improve Description: Including pain rating scale, medication(s)/side effects and non-pharmacologic comfort measures 10/27/2020 1209 by Milinda Hirschfeld, RN Outcome: Progressing 10/27/2020 1209 by Milinda Hirschfeld, RN Outcome: Progressing   Problem: Health Behavior/Discharge Planning: Goal: Ability to manage health-related needs will improve 10/27/2020 1209 by Milinda Hirschfeld, RN Outcome: Progressing 10/27/2020 1209 by Milinda Hirschfeld, RN Outcome: Progressing   Problem: Clinical Measurements: Goal: Ability to maintain clinical measurements within normal limits will improve 10/27/2020 1209 by Milinda Hirschfeld, RN Outcome: Progressing 10/27/2020 1209 by Milinda Hirschfeld, RN Outcome: Progressing Goal: Will remain free from infection 10/27/2020 1209 by Milinda Hirschfeld, RN Outcome: Progressing 10/27/2020 1209 by Milinda Hirschfeld, RN Outcome: Progressing Goal: Diagnostic test results will improve 10/27/2020 1209 by Milinda Hirschfeld, RN Outcome: Progressing 10/27/2020 1209 by Milinda Hirschfeld, RN Outcome: Progressing Goal: Respiratory complications will improve 10/27/2020 1209 by Milinda Hirschfeld, RN Outcome: Progressing 10/27/2020 1209 by Milinda Hirschfeld, RN Outcome: Progressing Goal: Cardiovascular complication will be avoided 10/27/2020 1209 by Milinda Hirschfeld, RN Outcome: Progressing 10/27/2020 1209 by Milinda Hirschfeld, RN Outcome: Progressing   Problem: Activity: Goal: Risk for activity intolerance will decrease 10/27/2020 1209 by Milinda Hirschfeld, RN Outcome: Progressing 10/27/2020 1209 by Milinda Hirschfeld, RN Outcome: Progressing   Problem: Nutrition: Goal: Adequate nutrition will be maintained 10/27/2020 1209 by Milinda Hirschfeld, RN Outcome: Progressing 10/27/2020 1209 by Milinda Hirschfeld, RN Outcome: Progressing   Problem: Coping: Goal: Level of anxiety will decrease 10/27/2020 1209 by Milinda Hirschfeld, RN Outcome:  Progressing 10/27/2020 1209 by Milinda Hirschfeld, RN Outcome: Progressing   Problem: Elimination: Goal: Will not experience complications related to bowel motility 10/27/2020 1209 by Milinda Hirschfeld, RN Outcome: Progressing 10/27/2020 1209 by Milinda Hirschfeld, RN Outcome: Progressing Goal: Will not experience complications related to urinary retention 10/27/2020 1209 by Milinda Hirschfeld, RN Outcome: Progressing 10/27/2020 1209 by Milinda Hirschfeld, RN Outcome: Progressing   Problem: Pain Managment: Goal: General experience of comfort will improve 10/27/2020 1209 by Milinda Hirschfeld, RN Outcome: Progressing 10/27/2020 1209 by Milinda Hirschfeld, RN Outcome: Progressing   Problem: Safety: Goal: Ability to remain free from injury will improve 10/27/2020 1209 by Milinda Hirschfeld, RN Outcome: Progressing 10/27/2020 1209 by Milinda Hirschfeld, RN Outcome: Progressing   Problem: Skin Integrity: Goal: Risk for impaired skin integrity will decrease 10/27/2020 1209 by Milinda Hirschfeld, RN Outcome: Progressing 10/27/2020 1209 by Milinda Hirschfeld, RN Outcome: Progressing

## 2020-10-27 NOTE — Consult Note (Signed)
NEUROLOGY CONSULTATION NOTE   Date of service: October 27, 2020 Patient Name: Gordon Farrell MRN:  [id number] DOB:  1945/10/01 Reason for consult: acute infarct MRI brain Consult requested by: Dr. Lesia Sago _ _ _   _ __   _ __ _ _  __ __   _ __   __ _  History of Present Illness   This is a 75 year old man with a history of DVT/PE on Eliquis, new diagnosis of A. fib, hyperlipidemia, hypothyroidism, pancytopenia, MGUS, chronic right upper back pain who presented to the emergency department at [facility] regional yesterday after a fall.  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  Patient reports that yesterday he became progressively and diffusely weak and wobbly until he lost his footing.  It was not a mechanical fall he just did not have the strength to stand up.  He did not lose consciousness or feel presyncopal symptoms.  He did not have head trauma with the fall.  He underwent a cardiac catheterization on June 20 and was found on arrival to ED June 23 to have a groin hematoma and a hemoglobin of 7.7 and was hospitalized for symptomatic anemia.  Due to the fall an MRI brain was performed which showed an incidental punctate 3 mm acute ischemic nonhemorrhagic cortical infarct in the posterior left temporal occipital region.    Eliquis is currently being held in the setting of symptomatic anemia.  MRI L spine - mild L L4 neuroforaminal stenosis, otherwise no significant findings.  No findings to explain LE weakness  CNS imaging personally reviewed   TTE performed 10/21/20 showed no intracardiac clot:   1. Left ventricular ejection fraction, by estimation, is 55 to 60%. The  left ventricle has normal function. The left ventricle has no regional  wall motion abnormalities. Left ventricular diastolic parameters were  normal.   2. Right ventricular systolic function is normal. The right ventricular  size is normal.   3. The mitral valve is normal in structure.  Trivial mitral valve  regurgitation. No evidence of mitral stenosis.   4. The aortic valve is normal in structure. Aortic valve regurgitation is  not visualized. Mild aortic valve sclerosis is present, with no evidence  of aortic valve stenosis.   5. The inferior vena cava is normal in size with greater than 50%  respiratory variability, suggesting right atrial pressure of 3 mmHg.    ROS   Per HPI; all other systems reviewed and are negative  Past History   Past Medical History:  Diagnosis Date   Anemia    Dysrhythmia    Hypothyroidism    Past Surgical History:  Procedure Laterality Date   BACK SURGERY     CHOLECYSTECTOMY     COLON RESECTION     multiple times   LEFT HEART CATH AND CORONARY ANGIOGRAPHY N/A 10/23/2020   Procedure: LEFT HEART CATH AND CORONARY ANGIOGRAPHY with coronary intervention;  Surgeon: Dionisio David, MD;  Location: [place] [facility];  Service: Cardiovascular;  Laterality: N/A;   TOTAL HIP ARTHROPLASTY  05/06/2010   Family History  Problem Relation Age of Onset   Osteoporosis Mother    Social History   Socioeconomic History   Marital status: Married    Spouse name: Not on file   Number of children: Not on file   Years of education: Not on file   Highest education level: Not on file  Occupational History   Not on file  Tobacco Use  Smoking status: Never   Smokeless tobacco: Never  Vaping Use   Vaping Use: Never used  Substance and Sexual Activity   Alcohol use: No    Alcohol/week: 0.0 standard drinks   Drug use: Never   Sexual activity: Not on file  Other Topics Concern   Not on file  Social History Narrative   Not on file   Social Determinants of Health   Financial Resource Strain: Not on file  Food Insecurity: Not on file  Transportation Needs: Not on file  Physical Activity: Not on file  Stress: Not on file  Social Connections: Not on file   No Known Allergies  Medications   Medications Prior to Admission  Medication  Sig Dispense Refill Last Dose   apixaban (ELIQUIS) 5 MG TABS tablet Take 5 mg by mouth 2 (two) times daily.   10/26/2020 at 0530   atorvastatin (LIPITOR) 40 MG tablet Take 40 mg by mouth daily.   10/25/2020 at Unknown   finasteride (PROSCAR) 5 MG tablet Take 5 mg by mouth daily.   10/25/2020 at Unknown   gabapentin (NEURONTIN) 100 MG capsule Take 100 mg by mouth 3 (three) times daily.   10/26/2020 at 0530   latanoprost (XALATAN) 0.005 % ophthalmic solution Place 1 drop into both eyes at bedtime.   10/25/2020 at 2100   levothyroxine (SYNTHROID) 100 MCG tablet Take 100 mcg by mouth daily.   10/26/2020 at 0530   metoprolol tartrate (LOPRESSOR) 25 MG tablet Take 1 tablet (25 mg total) by mouth 2 (two) times daily. 60 tablet 0 10/26/2020 at 0530   pantoprazole (PROTONIX) 40 MG tablet Take 1 tablet (40 mg total) by mouth daily. 30 tablet 0 10/26/2020 at 0530   pilocarpine (SALAGEN) 5 MG tablet Take 5 mg by mouth in the morning, at noon, and at bedtime.   10/26/2020 at 0530   pyridoxine (B-6) 100 MG tablet Take 100 mg by mouth daily.      sertraline (ZOLOFT) 100 MG tablet Take 100 mg by mouth daily.   10/26/2020 at 0530   timolol (TIMOPTIC) 0.5 % ophthalmic solution Place 1 drop into the right eye 2 (two) times daily.   10/26/2020 at 0530   traZODone (DESYREL) 100 MG tablet Take 100 mg by mouth at bedtime.   10/25/2020 at 2100     Vitals   Vitals:   10/26/20 2145 10/27/20 0019 10/27/20 0444 10/27/20 0754  BP: (!) 105/48 (!) 103/47 (!) 94/46 (!) 100/48  Pulse: 70 79 72 75  Resp: 18 18 20 18   Temp: (!) 97.5 F (36.4 C) 98.9 F (37.2 C) 98 F (36.7 C) 98.4 F (36.9 C)  TempSrc: Oral Oral Oral   SpO2: 100% 100% 96% 97%  Weight:      Height:         Body mass index is 27.98 kg/m.  Physical Exam   Physical Exam Gen: A&O x4, NAD Resp: normal WOB CV: extremities appear well-perfused  Neuro: *MS: A&O x4. Follows multi-step commands.  *Speech: fluid, nondysarthric, able to name and repeat *CN:     I: Deferred   II,III: PERRLA, VFF by confrontation, optic discs not visualized 2/2 pupillary constriction   III,IV,VI: EOMI w/o nystagmus, no ptosis   V: Sensation intact from V1 to V3 to LT   VII: Eyelid closure was full.  R NLF flattening.   VIII: Hearing intact to voice   IX,X: Voice normal, palate elevates symmetrically    XI: SCM/trap 5/5 bilat   XII:  Tongue protrudes midline, no atrophy or fasciculations   *Motor:   Normal bulk.  No tremor, rigidity or bradykinesia. BUE 5/5 strength throughout, BLE 4/5 diffusely, symmetric *Sensory: Intact to light touch, pinprick, temperature vibration throughout. Symmetric. Propioception intact bilat.  No double-simultaneous extinction.  *Coordination:  FNF intact bilat *Reflexes:  2+ and symmetric throughout without clonus; toes down-going bilat *Gait: deferred  NIHSS = 3 (1 facial palsy, 1 each motor RLE and LLE)  Premorbid mRS = 2   Labs   CBC:  Recent Labs  Lab 10/20/20 1949 10/22/20 0140 10/26/20 0601 10/26/20 1558 10/26/20 2158 10/27/20 0635  WBC 2.3*   < > 1.6* 1.8*  --  2.3*  NEUTROABS 1.4*  --  0.8*  --   --   --   HGB 8.4*   < > 7.7* 7.8* 8.7* 7.9*  HCT 25.4*   < > 22.7* 23.0* 25.9* 22.7*  MCV 111.9*   < > 111.3* 106.5*  --  103.7*  PLT 170   < > 198 165  --  153   < > = values in this interval not displayed.    Basic Metabolic Panel:  Lab Results  Component Value Date   NA 135 10/27/2020   K 3.6 10/27/2020   CO2 21 (L) 10/27/2020   GLUCOSE 102 (H) 10/27/2020   BUN 20 10/27/2020   CREATININE 1.21 10/27/2020   CALCIUM 7.7 (L) 10/27/2020   GFRNONAA >60 10/27/2020   Lipid Panel:  Lab Results  Component Value Date   LDLCALC 107 09/05/2014   HgbA1c: No results found for: HGBA1C Urine Drug Screen: No results found for: LABOPIA, COCAINSCRNUR, LABBENZ, AMPHETMU, THCU, LABBARB  Alcohol Level No results found for: ETH   Impression   This is a 75 year old man with a history of DVT/PE on Eliquis, new diagnosis of  A. fib, hyperlipidemia, hypothyroidism, pancytopenia, MGUS, chronic right upper back pain who presented to the emergency department at [facility] regional yesterday after a fall.  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  The MRI finding in question is a 2-3 mm punctate focus restricted diffusion in the L occipitotemporal region. This is an incidental finding and the patient is asymptomatic with respect to this v small stroke. 8% of patients undergoing MRI brain after cardiac cath will have a stroke on brain MRI the vast majority of which are clinically asymptomatic. Alternatively it may have been central embolic related to new dx a fib. Recommend completion of stroke workup for risk factor modification and optimal secondary stroke prevention below.  He is mildly and diffusely weak in his BLE which is favored to be 2/2 his symptomatic anemia. No etiology found on MRI L spine. If BLE weakness does not improve to recent baseline with treatment of his acute medical issues consider MRI c and t spine (defer for now).  Recommendations   - No indication for permissive hypertension given the size of the infarct however hypotension should be avoided - No indication for holding therapeutic anticoagulation given the size of the infarct. Whenever eliquis is able to be safely restarted from a medical standpoint (symptomatic anemia, groin hematoma) it should be restarted for dual indications a fib and DVT/PE - MRI brain completed - TTE completed - MRA H&N ordered and pending - No indication for antiplatelet in the setting of therapeutic anticoagulation for secondary stroke prevention - Check A1c and LDL + add statin per guidelines - Tele - Will continue to follow   ______________________________________________________________________  Thank you for the opportunity to take part in the care of this patient. If you have any further questions, please contact the neurology  consultation attending.  Signed,  Su Monks, MD [facility] [phone]  If 7pm- 7am, please page neurology on call as listed in [place].

## 2020-10-27 NOTE — Plan of Care (Signed)

## 2020-10-27 NOTE — TOC Initial Note (Signed)
Transition of Care [facility]) - Initial/Assessment Note    Patient Details  Name: Gordon Farrell MRN: [id number] Date of Birth: [birth year]  [facility]) CM/SW Contact:    Candie Chroman, LCSW Phone Number: 10/27/2020, 4:07 PM  Clinical Narrative:   CSW met with patient. No supports at bedside. CSW introduced role and explained that PT recommendations would be discussed. Patient is willing to consider SNF placement but wants to see how he improves over his hospitalization. He gave CSW permission to go ahead and send referral out. Gave CMS scores for facilities within 25 miles of his zip code. His preacher later came by when patient was leaving the unit. He asked CSW to call his wife with an update as she is unable to drive. CSW called wife and made her aware of SNF recommendation as well. She is agreeable to SNF placement and said [facility] is first preference, which is what patient said as well. [facility] in [place] is second preference. CSW later found out [facility] is not in network with his insurance. Left voicemail for [facility] admissions coordinator asking her to review referral. No further concerns. CSW encouraged patient and his wife to contact CSW as needed. CSW will continue to follow patient and his wife for support and facilitate discharge to SNF once medically stable.               Expected Discharge Plan: Skilled Nursing Facility Barriers to Discharge: Continued Medical Work up   Patient Goals and CMS Choice   CMS Medicare.gov Compare Post Acute Care list provided to:: Patient    Expected Discharge Plan and Services Expected Discharge Plan: [place] Choice: [place] arrangements for the past 2 months: Single Family Home                                      Prior Living Arrangements/Services Living arrangements for the past 2 months: Single Family Home Lives with::  Spouse Patient language and need for interpreter reviewed:: Yes Do you feel safe going back to the place where you live?: Yes      Need for Family Participation in Patient Care: Yes (Comment) Care giver support system in place?: Yes (comment)   Criminal Activity/Legal Involvement Pertinent to Current Situation/Hospitalization: No - Comment as needed  Activities of Daily Living Home Assistive Devices/Equipment: None ADL Screening (condition at time of admission) Patient's cognitive ability adequate to safely complete daily activities?: Yes Is the patient deaf or have difficulty hearing?: Yes Does the patient have difficulty seeing, even when wearing glasses/contacts?: No Does the patient have difficulty concentrating, remembering, or making decisions?: No Patient able to express need for assistance with ADLs?: Yes Does the patient have difficulty dressing or bathing?: No Independently performs ADLs?: No Communication: Independent Dressing (OT): Independent Grooming: Independent Feeding: Independent Bathing: Independent with device (comment) (bars needs a chair) Toileting: Independent In/Out Bed: Independent Walks in Home: Independent Does the patient have difficulty walking or climbing stairs?: Yes Weakness of Legs: Both Weakness of Arms/Hands: None  Permission Sought/Granted Permission sought to share information with : Facility Sport and exercise psychologist, Family Supports    Share Information with NAME: Seng Larch  Permission granted to share info w AGENCY: SNF's  Permission granted to share info w Relationship: Wife  Permission granted to share info w Contact Information: [phone]  Emotional  Assessment Appearance:: Appears stated age Attitude/Demeanor/Rapport: Engaged, Gracious Affect (typically observed): Accepting, Appropriate, Calm, Pleasant Orientation: : Oriented to Self, Oriented to Place, Oriented to  Time, Oriented to Situation Alcohol / Substance Use: Not  Applicable Psych Involvement: No (comment)  Admission diagnosis:  Shortness of breath [R06.02] Hematoma [T14.8XXA] Fall [W19.XXXA] Status post cardiac catheterization [Z98.890] Right groin pain [R10.31] Generalized weakness [R53.1] Atrial fibrillation with RVR ([place]) [I48.91] Pancytopenia ([place]) [D61.818] Right leg swelling [M79.89] Symptomatic anemia [D64.9] Patient Active Problem List   Diagnosis Date Noted   Symptomatic anemia 10/26/2020   Unspecified atrial fibrillation ([place]) 10/21/2020   Acute CHF (congestive heart failure) ([place]) 10/21/2020   Dysphagia 10/21/2020   Chest pain 10/20/2020   Acquired hypothyroidism 03/08/2015   Benign prostatic hyperplasia with urinary obstruction 03/08/2015   Compression fracture of lumbar vertebra ([place]) 03/08/2015   Crohn's disease of large intestine ([place]) 03/08/2015   Deep vein thrombosis (DVT) ([place]) 03/08/2015   Psychogenic cyclical vomiting 86/82/5749   PCP:  Danae Orleans, MD Pharmacy:   [facility] - [address] [place] [address] Phone: [phone] Fax: [place] [address], [place] [address] Phone: [phone] Fax: [phone]     Social Determinants of Health (SDOH) Interventions    Readmission Risk Interventions No flowsheet data found.

## 2020-10-27 NOTE — Evaluation (Signed)
Physical Therapy Evaluation Patient Details Name: Gordon Farrell MRN: [id number] DOB: 1945-08-14 Today's Date: 10/27/2020   History of Present Illness  Pt is a 75 y/o M admitted on 10/26/20 after a fall. Pt being treated for symptomatic anemia, suspect 2/2 groin hematoma present on admission, suspect 2/2 to recent heart cath on 10/23/20. 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 PMH: a-fib, DVT, on eliquis, HLD, hypothyroid, BPH, pancytopenia, MGUS, chronic R upper back pain  Clinical Impression  Pt seen for PT evaluation. Pt reporting significant back pain & nurse notified of pt's request for pain meds; pt agreeable to participation in spite of c/o pain. PT instructs pt on log rolling technique & requires max assist to attempt to upright trunk but pt unable to tolerate to come to full upright sitting 2/2 significant back pain & requests to return supine. Pt is able to scoot to [facility] with cuing for technique. Pt performs BLE strengthening exercises with instructional cuing for technique. Educated pt on recommendation of STR upon d/c to maximize independence with functional mobility & reduce fall risk prior to return home. Notified MD of pt's back pain limiting mobility.      Follow Up Recommendations SNF    Equipment Recommendations   (TBD in next venue)    Recommendations for Other Services       Precautions / Restrictions Precautions Precautions: Fall Restrictions Weight Bearing Restrictions: No      Mobility  Bed Mobility Overal bed mobility: Needs Assistance Bed Mobility: Rolling;Sidelying to Sit;Sit to Sidelying Rolling: Min assist Sidelying to sit: Max assist     Sit to sidelying: Mod assist General bed mobility comments: HOB slightly elevated, bed rails, max cuing for log rolling technique, unable to fully achieve upright sitting EOB 2/2 back pain    Transfers                  General transfer comment: unable  Ambulation/Gait                Stairs            Wheelchair Mobility    Modified Rankin (Stroke Patients Only)       Balance Overall balance assessment: Needs assistance   Sitting balance-Leahy Scale: Zero                                       Pertinent Vitals/Pain Pain Assessment: Faces Pain Score: 9  Pain Location: back Pain Descriptors / Indicators: Stabbing;Shooting;Grimacing Pain Intervention(s): Limited activity within patient's tolerance;Monitored during session;Repositioned;RN gave pain meds during session    [place] expects to be discharged to:: Private residence Living Arrangements: Spouse/significant other Available Help at Discharge: Family;Available 24 hours/day Type of Home: Apartment Home Access: Level entry     Home Layout: One level Home Equipment: None      Prior Function Level of Independence: Independent   Gait / Transfers Assistance Needed: Independent without AD  ADL's / Homemaking Assistance Needed: Pt drives, assists with shopping. Wife does housework, cooking. Pt able to perform his own ADL  Comments: Endorses this 1 fall prior to admission.     Hand Dominance        Extremity/Trunk Assessment   Upper Extremity Assessment Upper Extremity Assessment: Generalized weakness    Lower Extremity Assessment Lower Extremity Assessment: Generalized weakness RLE Sensation:  decreased light touch;history of peripheral neuropathy;decreased proprioception LLE Sensation: decreased light touch;history of peripheral neuropathy;decreased proprioception       Communication   Communication: HOH  Cognition Arousal/Alertness: Awake/alert Behavior During Therapy: WFL for tasks assessed/performed Overall Cognitive Status: Within Functional Limits for tasks assessed                                 General Comments: A&O x 4      General Comments  General comments (skin integrity, edema, etc.): neuropathy, b/l LE; edema, R LE, extensive bruising, R UE    Exercises General Exercises - Lower Extremity Heel Slides: AROM;Strengthening;Both;10 reps;Supine Hip ABduction/ADduction: AROM;Strengthening;Both;10 reps;Supine Other Exercises Other Exercises: Bed mobility, bed level toileting, UB dressing. Educ re: AE for ADL performance   Assessment/Plan    PT Assessment Patient needs continued PT services  PT Problem List Decreased strength;Decreased mobility;Decreased safety awareness;Decreased activity tolerance;Pain;Decreased balance;Decreased knowledge of use of DME       PT Treatment Interventions DME instruction;Therapeutic exercise;Gait training;Balance training;Wheelchair mobility training;Manual Primary school teacher;Functional mobility training;Modalities;Therapeutic activities;Patient/family education    PT Goals (Current goals can be found in the Care Plan section)  Acute Rehab PT Goals Patient Stated Goal: decreased pain PT Goal Formulation: With patient Time For Goal Achievement: 11/10/20 Potential to Achieve Goals: Fair    Frequency Min 2X/week   Barriers to discharge Decreased caregiver support unsure if wife can provide physical assistance at d/c    Co-evaluation               AM-PAC PT "6 Clicks" Mobility  Outcome Measure Help needed turning from your back to your side while in a flat bed without using bedrails?: A Lot Help needed moving from lying on your back to sitting on the side of a flat bed without using bedrails?: Total Help needed moving to and from a bed to a chair (including a wheelchair)?: Total Help needed standing up from a chair using your arms (e.g., wheelchair or bedside chair)?: Total Help needed to walk in hospital room?: Total Help needed climbing 3-5 steps with a railing? : Total 6 Click Score: 7    End of Session   Activity Tolerance: Patient limited  by pain Patient left: in bed;with call bell/phone within reach;with bed alarm set;with nursing/sitter in room Nurse Communication: Mobility status;Patient requests pain meds PT Visit Diagnosis: Difficulty in walking, not elsewhere classified (R26.2);Muscle weakness (generalized) (M62.81);Pain Pain - Right/Left: Right Pain - part of body:  (back)    Time: 0277-4128 PT Time Calculation (min) (ACUTE ONLY): 16 min   Charges:   PT Evaluation $PT Eval Moderate Complexity: 1 Mod PT Treatments $Therapeutic Exercise: 8-22 mins        Lavone Nian, PT, DPT 10/27/20, 3:04 PM   Waunita Schooner 10/27/2020, 3:03 PM

## 2020-10-28 DIAGNOSIS — D472 Monoclonal gammopathy: Secondary | ICD-10-CM | POA: Diagnosis present

## 2020-10-28 DIAGNOSIS — Z9049 Acquired absence of other specified parts of digestive tract: Secondary | ICD-10-CM | POA: Diagnosis not present

## 2020-10-28 DIAGNOSIS — I480 Paroxysmal atrial fibrillation: Secondary | ICD-10-CM | POA: Diagnosis present

## 2020-10-28 DIAGNOSIS — I6389 Other cerebral infarction: Secondary | ICD-10-CM | POA: Diagnosis present

## 2020-10-28 DIAGNOSIS — E039 Hypothyroidism, unspecified: Secondary | ICD-10-CM | POA: Diagnosis present

## 2020-10-28 DIAGNOSIS — M4856XA Collapsed vertebra, not elsewhere classified, lumbar region, initial encounter for fracture: Secondary | ICD-10-CM | POA: Diagnosis present

## 2020-10-28 DIAGNOSIS — R29703 NIHSS score 3: Secondary | ICD-10-CM | POA: Diagnosis present

## 2020-10-28 DIAGNOSIS — I4891 Unspecified atrial fibrillation: Secondary | ICD-10-CM

## 2020-10-28 DIAGNOSIS — W19XXXA Unspecified fall, initial encounter: Secondary | ICD-10-CM | POA: Diagnosis present

## 2020-10-28 DIAGNOSIS — D62 Acute posthemorrhagic anemia: Secondary | ICD-10-CM | POA: Diagnosis present

## 2020-10-28 DIAGNOSIS — E876 Hypokalemia: Secondary | ICD-10-CM | POA: Diagnosis present

## 2020-10-28 DIAGNOSIS — Z20822 Contact with and (suspected) exposure to covid-19: Secondary | ICD-10-CM | POA: Diagnosis present

## 2020-10-28 DIAGNOSIS — N136 Pyonephrosis: Secondary | ICD-10-CM | POA: Diagnosis present

## 2020-10-28 DIAGNOSIS — D72819 Decreased white blood cell count, unspecified: Secondary | ICD-10-CM | POA: Diagnosis present

## 2020-10-28 DIAGNOSIS — F419 Anxiety disorder, unspecified: Secondary | ICD-10-CM | POA: Diagnosis present

## 2020-10-28 DIAGNOSIS — Z79890 Hormone replacement therapy (postmenopausal): Secondary | ICD-10-CM | POA: Diagnosis not present

## 2020-10-28 DIAGNOSIS — G4700 Insomnia, unspecified: Secondary | ICD-10-CM | POA: Diagnosis present

## 2020-10-28 DIAGNOSIS — Y838 Other surgical procedures as the cause of abnormal reaction of the patient, or of later complication, without mention of misadventure at the time of the procedure: Secondary | ICD-10-CM | POA: Diagnosis present

## 2020-10-28 DIAGNOSIS — K5010 Crohn's disease of large intestine without complications: Secondary | ICD-10-CM | POA: Diagnosis present

## 2020-10-28 DIAGNOSIS — Y92009 Unspecified place in unspecified non-institutional (private) residence as the place of occurrence of the external cause: Secondary | ICD-10-CM | POA: Diagnosis not present

## 2020-10-28 DIAGNOSIS — D649 Anemia, unspecified: Secondary | ICD-10-CM | POA: Diagnosis not present

## 2020-10-28 DIAGNOSIS — G8929 Other chronic pain: Secondary | ICD-10-CM | POA: Diagnosis present

## 2020-10-28 DIAGNOSIS — Z79899 Other long term (current) drug therapy: Secondary | ICD-10-CM | POA: Diagnosis not present

## 2020-10-28 DIAGNOSIS — Z86718 Personal history of other venous thrombosis and embolism: Secondary | ICD-10-CM | POA: Diagnosis not present

## 2020-10-28 DIAGNOSIS — F32A Depression, unspecified: Secondary | ICD-10-CM | POA: Diagnosis present

## 2020-10-28 DIAGNOSIS — N401 Enlarged prostate with lower urinary tract symptoms: Secondary | ICD-10-CM | POA: Diagnosis present

## 2020-10-28 DIAGNOSIS — I639 Cerebral infarction, unspecified: Secondary | ICD-10-CM | POA: Diagnosis present

## 2020-10-28 DIAGNOSIS — N138 Other obstructive and reflux uropathy: Secondary | ICD-10-CM | POA: Diagnosis present

## 2020-10-28 DIAGNOSIS — L7632 Postprocedural hematoma of skin and subcutaneous tissue following other procedure: Secondary | ICD-10-CM | POA: Diagnosis present

## 2020-10-28 DIAGNOSIS — R0602 Shortness of breath: Secondary | ICD-10-CM | POA: Diagnosis present

## 2020-10-28 DIAGNOSIS — D61818 Other pancytopenia: Secondary | ICD-10-CM | POA: Diagnosis present

## 2020-10-28 LAB — CBC WITH DIFFERENTIAL/PLATELET
Abs Immature Granulocytes: 0.19 10*3/uL — ABNORMAL HIGH (ref 0.00–0.07)
Basophils Absolute: 0 10*3/uL (ref 0.0–0.1)
Basophils Relative: 3 %
Eosinophils Absolute: 0.2 10*3/uL (ref 0.0–0.5)
Eosinophils Relative: 17 %
HCT: 22.5 % — ABNORMAL LOW (ref 39.0–52.0)
Hemoglobin: 7.6 g/dL — ABNORMAL LOW (ref 13.0–17.0)
Immature Granulocytes: 16 %
Lymphocytes Relative: 22 %
Lymphs Abs: 0.3 10*3/uL — ABNORMAL LOW (ref 0.7–4.0)
MCH: 36.2 pg — ABNORMAL HIGH (ref 26.0–34.0)
MCHC: 33.8 g/dL (ref 30.0–36.0)
MCV: 107.1 fL — ABNORMAL HIGH (ref 80.0–100.0)
Monocytes Absolute: 0.1 10*3/uL (ref 0.1–1.0)
Monocytes Relative: 7 %
Neutro Abs: 0.4 10*3/uL — CL (ref 1.7–7.7)
Neutrophils Relative %: 35 %
Platelets: 158 10*3/uL (ref 150–400)
RBC: 2.1 MIL/uL — ABNORMAL LOW (ref 4.22–5.81)
RDW: 23.3 % — ABNORMAL HIGH (ref 11.5–15.5)
Smear Review: NORMAL
WBC: 1.2 10*3/uL — CL (ref 4.0–10.5)
nRBC: 0 % (ref 0.0–0.2)

## 2020-10-28 LAB — BASIC METABOLIC PANEL
Anion gap: 5 (ref 5–15)
BUN: 21 mg/dL (ref 8–23)
CO2: 22 mmol/L (ref 22–32)
Calcium: 7.8 mg/dL — ABNORMAL LOW (ref 8.9–10.3)
Chloride: 109 mmol/L (ref 98–111)
Creatinine, Ser: 1.02 mg/dL (ref 0.61–1.24)
GFR, Estimated: >60 mL/min (ref >60)
Glucose, Bld: 101 mg/dL — ABNORMAL HIGH (ref 70–99)
Potassium: 3.6 mmol/L (ref 3.5–5.1)
Sodium: 136 mmol/L (ref 135–145)

## 2020-10-28 LAB — URINE CULTURE: Culture: 6000 — AB

## 2020-10-28 LAB — HEMOGLOBIN AND HEMATOCRIT, BLOOD
HCT: 23.2 % — ABNORMAL LOW (ref 39.0–52.0)
Hemoglobin: 8 g/dL — ABNORMAL LOW (ref 13.0–17.0)

## 2020-10-28 LAB — LIPID PANEL
Cholesterol: 70 mg/dL (ref 0–200)
HDL: 17 mg/dL — ABNORMAL LOW (ref >40)
LDL Cholesterol: 39 mg/dL (ref 0–99)
Total CHOL/HDL Ratio: 4.1 RATIO
Triglycerides: 71 mg/dL (ref <150)
VLDL: 14 mg/dL (ref 0–40)

## 2020-10-28 LAB — GLUCOSE, CAPILLARY: Glucose-Capillary: 142 mg/dL — ABNORMAL HIGH (ref 70–99)

## 2020-10-28 MED ORDER — SODIUM CHLORIDE 0.9 % IV SOLN
1.0000 g | INTRAVENOUS | Status: AC
  Administered 2020-10-28 – 2020-11-01 (×5): 1 g via INTRAVENOUS
  Filled 2020-10-28: qty 10
  Filled 2020-10-28 (×4): qty 1

## 2020-10-28 MED ORDER — SODIUM CHLORIDE 0.9 % IV SOLN: INTRAVENOUS | Status: AC

## 2020-10-28 MED ORDER — BLISTEX MEDICATED EX OINT
TOPICAL_OINTMENT | CUTANEOUS | Status: DC | PRN
  Filled 2020-10-28 (×2): qty 6.3

## 2020-10-28 MED ORDER — BIOTENE DRY MOUTH MT LIQD: 15.0000 mL | OROMUCOSAL | Status: DC | PRN

## 2020-10-28 NOTE — Progress Notes (Signed)
Protective precaution isolation order received.

## 2020-10-28 NOTE — Progress Notes (Signed)
Dr. Horris Latino is informed about Patient critical lab values of WBC 1.2, neutrophil 0.4 via secure chat.

## 2020-10-28 NOTE — Progress Notes (Signed)
Neurology F/u Note  Subjective: - Patient feeling better today after receiving blood, BLE are now full strength  Interval data: - MRA H&N showed no hemodynamically significant stenoses  Exam: Vitals:   10/28/20 0725 10/28/20 1207  BP: 107/60 114/64  Pulse: 83 95  Resp: 18 18  Temp: 98.4 F (36.9 C) 97.6 F (36.4 C)  SpO2: 99% 98%   Physical Exam Gen: A&O x4, NAD Resp: normal WOB CV: extremities appear well-perfused   Neuro: *MS: A&O x4. Follows multi-step commands. *Speech: fluid, nondysarthric, able to name and repeat *CN:   I: Deferred   II,III: PERRLA, VFF by confrontation, optic discs not visualized 2/2 pupillary constriction   III,IV,VI: EOMI w/o nystagmus, no ptosis   V: Sensation intact from V1 to V3 to LT   VII: Eyelid closure was full.  R NLF flattening.   VIII: Hearing intact to voice   IX,X: Voice normal, palate elevates symmetrically   XI: SCM/trap 5/5 bilat   XII: Tongue protrudes midline, no atrophy or fasciculations   *Motor:   Normal bulk.  No tremor, rigidity or bradykinesia. 5/5 strength throughout in all extremities *Sensory: Intact to light touch, pinprick, temperature vibration throughout. Symmetric. Propioception intact bilat.  No double-simultaneous extinction. *Coordination:  FNF intact bilat *Reflexes:  2+ and symmetric throughout without clonus; toes down-going bilat *Gait: deferred   NIHSS = 1 (facial palsy)   Premorbid mRS = 2  Impression: This is a 75 year old man with a history of DVT/PE on Eliquis, new diagnosis of A. fib, hyperlipidemia, hypothyroidism, pancytopenia, MGUS, chronic right upper back pain who presented to the emergency department at [facility] yesterday after a fall.  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   The MRI finding in question is a 2-3 mm punctate focus restricted diffusion in the L occipitotemporal region. This is an incidental finding and the patient is asymptomatic  with respect to this v small stroke. 8% of patients undergoing MRI brain after cardiac cath will have a stroke on brain MRI the vast majority of which are clinically asymptomatic. Alternatively it may have been central embolic related to new dx a fib. We performed remaining stroke w/u to rule out other contributing factors, which is now completed.  His BLE weakness has resolved and is favored to have been 2/2 his symptomatic anemia and requires no further w/u at this time.  Recommendations:  - No indication for permissive hypertension given the size of the infarct however hypotension should be avoided - No indication for holding therapeutic anticoagulation given the size of the infarct. Whenever eliquis is able to be safely restarted from a medical standpoint (symptomatic anemia, groin hematoma) it should be restarted for dual indications a fib and DVT/PE - MRI brain, MRA H&N completed - No indication for antiplatelet in the setting of therapeutic anticoagulation for secondary stroke prevention - Continue atorvastatin 40mg  daily  Stroke w/u is complete. Neurology to sign off, please re-engage if any additional questions arise.  Su Monks, MD [facility] [phone]  If 7pm- 7am, please page neurology on call as listed in [place].

## 2020-10-28 NOTE — TOC Progression Note (Signed)
Transition of Care [facility]) - Progression Note    Patient Details  Name: Gordon Farrell MRN: [id number] Date of Birth: [birth year]  Transition of Care [facility]) CM/SW [place], LCSW Phone Number: 10/28/2020, 9:08 AM  Clinical Narrative:   Patient currently has no bed offers. CSW reached out to [facility], [facility], [facility], [facility], [facility], and [facility] -- asked them to review referrals and respond in the [place].    Expected Discharge Plan: [place] Barriers to Discharge: Continued Medical Work up  Expected Discharge Plan and Services Expected Discharge Plan: [place] Choice: [place] arrangements for the past 2 months: Single Family Home                                       Social Determinants of Health (SDOH) Interventions    Readmission Risk Interventions No flowsheet data found.

## 2020-10-28 NOTE — Progress Notes (Signed)
PROGRESS NOTE  Gordon Farrell EXH:[id number] DOB: 21-Jul-1945 DOA: 10/26/2020 PCP: Danae Orleans, MD  HPI/Recap of past 24 hours: Gordon Farrell is a 75 y.o. male with medical history significant for atrial fibrillation, history of DVT, on Eliquis, hyperlipidemia, hypothyroid, BPH, pancytopenia, MGUS, chronic right upper back pain, presents to the ED for chief concerns of fall. Pt reports that PTA, his legs became so weak and wobbly.  He endorses that he fell in the a.m. on day of admission.  He denies light head trauma and loss of consciousness.  He denies seizure activity. He reports he fell in the hospital. In the ED, vita signs stable, labs showed hemoglobin of 7.7 lower than patient's baseline of around 8, labs fairly stable.  COVID/influenza were negative.  Patient admitted for further management.    Today, patient still reports right-sided back pain, upon further investigation noted to be around the right flank area.  Noted to have a spiking temp yesterday evening.  Currently with worsening neutropenia.  Reports some dry mouth and blisters on lips.    Assessment/Plan: Principal Problem:   Symptomatic anemia Active Problems:   Acquired hypothyroidism   Benign prostatic hyperplasia with urinary obstruction   Compression fracture of lumbar vertebra (HCC)   Crohn's disease of large intestine (HCC)   Deep vein thrombosis (DVT) (HCC)   Unspecified atrial fibrillation (HCC)   CVA (cerebral vascular accident) ([place])   Symptomatic anemia likely 2/2 R groin hematoma present on admission Likely 2/2 recent left heart cath on 10/23/2020 Hgb 7.7 on admission, baseline around 8.1-8.4 Ultrasound ordered by EDP showed right groin hematoma measuring 7.2 cm and no evidence of pseudoaneurysm or AV fistula in the right groin S/p 2 unit of PRBC given on 10/26/20 Discussed with Dr Humphrey Rolls cardiologist about hematoma, rec CT pelvis to r/o retroperitoneal hematoma as well as hold Eliquis CT abd/pelvis  negative for retroperitoneal hemorrhage Continue to hold Eliquis Frequent CBC check  Possible pyelonephritis (R flank pain) UTI Last temp spike 100.9 on 10/27/2020, with neutropenia UC showed 6000 Klebsiella pneumonia, 7000 Enterococcus faecalis CT abdomen pelvis showed stable mucosal edema involving the right renal collecting system compatible with infection Given neutropenic status, start Rocephin Monitor fever curve Neutropenic precautions  Acute small ischemic cortical infarct Likely incidental, no focal neurologic deficits Noted to have bilateral lower extremity weakness, falls, syncope MRI brain showed above Echo 10/21/20 with EF of 55-60% MRA H&N unremarkable A1c pending, lipid panel with LDL 39, HDL 17 Neurology consulted, appreciate recs Continue to hold Eliquis for now due to groin hematoma PT/OT Telemetry  P. Atrial fibrillation Eliquis is being held at this time due to acute anemia and hematoma Restart once able   BLE numbness, chronic Noted urinary incontinence  MRI of the lumbar unremarkable Pain management   History of chronic leukopenia and thrombocytopenia Currently neutropenic, precautions ordered  History of DVT Holding eliquis at this time  Hypothyroidism Resumed levothyroxine  Depression/anxiety/insomnia Sertraline, trazodone         Estimated body mass index is 27.98 kg/m as calculated from the following:   Height as of this encounter: 5\' 8"  (1.727 m).   Weight as of this encounter: 83.5 kg.     Code Status: full  Family Communication: None at bedside  Disposition Plan: Status is: Inpatient  The patient will require care spanning > 2 midnights and should be moved to inpatient because: Inpatient level of care appropriate due to severity of illness  Dispo: The patient is from: Home  Anticipated d/c is to: SNF              Patient currently is not medically stable to d/c.   Difficult to place patient  No     Consultants: Neurology   Procedures: None  Antimicrobials: Rocephin  DVT prophylaxis: SCD due to hematoma   Objective: Vitals:   10/28/20 0013 10/28/20 0418 10/28/20 0725 10/28/20 1207  BP: 103/62 (!) 120/57 107/60 114/64  Pulse: 99 98 83 95  Resp: 15 18 18 18   Temp: 98.2 F (36.8 C) 98.1 F (36.7 C) 98.4 F (36.9 C) 97.6 F (36.4 C)  TempSrc: Oral Oral Oral Oral  SpO2: 95% 98% 99% 98%  Weight:      Height:        Intake/Output Summary (Last 24 hours) at 10/28/2020 1547 Last data filed at 10/28/2020 1522 Gross per 24 hour  Intake 191.3 ml  Output 750 ml  Net -558.7 ml   Filed Weights   10/26/20 0600  Weight: 83.5 kg    Exam: General: NAD, deconditioned Cardiovascular: S1, S2 present Respiratory: CTAB Abdomen: Soft, nontender, nondistended, bowel sounds present, right groin hematoma noted, tender to palpate.  Right flank pain tenderness Musculoskeletal: No bilateral pedal edema noted, right foot swelling Skin: Normal Psychiatry: Normal mood  Neurology: BUE 5/5 strength, BLE 4/5, sensation intact   Data Reviewed: CBC: Recent Labs  Lab 10/23/20 0218 10/26/20 0601 10/26/20 1558 10/26/20 2158 10/27/20 0635 10/27/20 1527 10/28/20 0606  WBC 1.1* 1.6* 1.8*  --  2.3*  --  1.2*  NEUTROABS  --  0.8*  --   --   --   --  0.4*  HGB 8.1* 7.7* 7.8* 8.7* 7.9* 7.8* 7.6*  HCT 24.4* 22.7* 23.0* 25.9* 22.7* 22.4* 22.5*  MCV 112.4* 111.3* 106.5*  --  103.7*  --  107.1*  PLT 203 198 165  --  153  --  427   Basic Metabolic Panel: Recent Labs  Lab 10/22/20 0140 10/23/20 0218 10/26/20 0601 10/27/20 0635 10/28/20 0606  NA 142 140 137 135 136  K 3.4* 3.5 3.1* 3.6 3.6  CL 113* 111 109 107 109  CO2 24 23 22  21* 22  GLUCOSE 107* 97 110* 102* 101*  BUN 18 12 13 20 21   CREATININE 1.05 0.94 1.06 1.21 1.02  CALCIUM 8.0* 7.7* 7.9* 7.7* 7.8*  MG 2.0 2.0 1.7  --   --    GFR: Estimated Creatinine Clearance: 65.8 mL/min (by C-G formula based on SCr of 1.02  mg/dL). Liver Function Tests: Recent Labs  Lab 10/26/20 0601  AST 28  ALT 19  ALKPHOS 73  BILITOT 1.3*  PROT 5.9*  ALBUMIN 2.6*   No results for input(s): LIPASE, AMYLASE in the last 168 hours.  No results for input(s): AMMONIA in the last 168 hours. Coagulation Profile: Recent Labs  Lab 10/23/20 0218 10/26/20 0601  INR 1.2 1.5*   Cardiac Enzymes: No results for input(s): CKTOTAL, CKMB, CKMBINDEX, TROPONINI in the last 168 hours. BNP (last 3 results) No results for input(s): PROBNP in the last 8760 hours. HbA1C: No results for input(s): HGBA1C in the last 72 hours. CBG: Recent Labs  Lab 10/27/20 2129  GLUCAP 142*   Lipid Profile: Recent Labs    10/28/20 0606  CHOL 70  HDL 17*  LDLCALC 39  TRIG 71  CHOLHDL 4.1   Thyroid Function Tests: No results for input(s): TSH, T4TOTAL, FREET4, T3FREE, THYROIDAB in the last 72 hours. Anemia Panel: No results for input(s):  VITAMINB12, FOLATE, FERRITIN, TIBC, IRON, RETICCTPCT in the last 72 hours. Urine analysis:    Component Value Date/Time   COLORURINE AMBER (A) 10/26/2020 0811   APPEARANCEUR CLOUDY (A) 10/26/2020 0811   LABSPEC 1.017 10/26/2020 0811   PHURINE 5.0 10/26/2020 0811   GLUCOSEU NEGATIVE 10/26/2020 0811   HGBUR LARGE (A) 10/26/2020 0811   BILIRUBINUR NEGATIVE 10/26/2020 0811   KETONESUR NEGATIVE 10/26/2020 0811   PROTEINUR 100 (A) 10/26/2020 0811   NITRITE NEGATIVE 10/26/2020 0811   LEUKOCYTESUR NEGATIVE 10/26/2020 0811   Sepsis Labs: @LABRCNTIP (procalcitonin:4,lacticidven:4)  ) Recent Results (from the past 240 hour(s))  Resp Panel by RT-PCR (Flu A&B, Covid) Nasopharyngeal Swab     Status: None   Collection Time: 10/20/20  8:11 PM   Specimen: Nasopharyngeal Swab; Nasopharyngeal(NP) swabs in vial transport medium  Result Value Ref Range Status   SARS Coronavirus 2 by RT PCR NEGATIVE NEGATIVE Final    Comment: (NOTE) SARS-CoV-2 target nucleic acids are NOT DETECTED.  The SARS-CoV-2 RNA is  generally detectable in upper respiratory specimens during the acute phase of infection. The lowest concentration of SARS-CoV-2 viral copies this assay can detect is 138 copies/mL. A negative result does not preclude SARS-Cov-2 infection and should not be used as the sole basis for treatment or other patient management decisions. A negative result may occur with  improper specimen collection/handling, submission of specimen other than nasopharyngeal swab, presence of viral mutation(s) within the areas targeted by this assay, and inadequate number of viral copies(<138 copies/mL). A negative result must be combined with clinical observations, patient history, and epidemiological information. The expected result is Negative.  Fact Sheet for Patients:  EntrepreneurPulse.com.au  Fact Sheet for Healthcare Providers:  IncredibleEmployment.be  This test is no t yet approved or cleared by the [place] FDA and  has been authorized for detection and/or diagnosis of SARS-CoV-2 by FDA under an Emergency Use Authorization (EUA). This EUA will remain  in effect (meaning this test can be used) for the duration of the COVID-19 declaration under Section 564(b)(1) of the Act, 21 U.S.C.section 360bbb-3(b)(1), unless the authorization is terminated  or revoked sooner.       Influenza A by PCR NEGATIVE NEGATIVE Final   Influenza B by PCR NEGATIVE NEGATIVE Final    Comment: (NOTE) The Xpert Xpress SARS-CoV-2/FLU/RSV plus assay is intended as an aid in the diagnosis of influenza from Nasopharyngeal swab specimens and should not be used as a sole basis for treatment. Nasal washings and aspirates are unacceptable for Xpert Xpress SARS-CoV-2/FLU/RSV testing.  Fact Sheet for Patients: EntrepreneurPulse.com.au  Fact Sheet for Healthcare Providers: IncredibleEmployment.be  This test is not yet approved or cleared by the [place] FDA and has been authorized for detection and/or diagnosis of SARS-CoV-2 by FDA under an Emergency Use Authorization (EUA). This EUA will remain in effect (meaning this test can be used) for the duration of the COVID-19 declaration under Section 564(b)(1) of the Act, 21 U.S.C. section 360bbb-3(b)(1), unless the authorization is terminated or revoked.  Performed at [facility], [address] [place], [place] [address]   Resp Panel by RT-PCR (Flu A&B, Covid) Nasopharyngeal Swab     Status: None   Collection Time: 10/26/20  6:01 AM   Specimen: Nasopharyngeal Swab; Nasopharyngeal(NP) swabs in vial transport medium  Result Value Ref Range Status   SARS Coronavirus 2 by RT PCR NEGATIVE NEGATIVE Final    Comment: (NOTE) SARS-CoV-2 target nucleic acids are NOT DETECTED.  The SARS-CoV-2 RNA is generally detectable  in upper respiratory specimens during the acute phase of infection. The lowest concentration of SARS-CoV-2 viral copies this assay can detect is 138 copies/mL. A negative result does not preclude SARS-Cov-2 infection and should not be used as the sole basis for treatment or other patient management decisions. A negative result may occur with  improper specimen collection/handling, submission of specimen other than nasopharyngeal swab, presence of viral mutation(s) within the areas targeted by this assay, and inadequate number of viral copies(<138 copies/mL). A negative result must be combined with clinical observations, patient history, and epidemiological information. The expected result is Negative.  Fact Sheet for Patients:  EntrepreneurPulse.com.au  Fact Sheet for Healthcare Providers:  IncredibleEmployment.be  This test is no t yet approved or cleared by the [place] FDA and  has been authorized for detection and/or diagnosis of SARS-CoV-2 by FDA under an Emergency Use Authorization (EUA). This EUA will remain   in effect (meaning this test can be used) for the duration of the COVID-19 declaration under Section 564(b)(1) of the Act, 21 U.S.C.section 360bbb-3(b)(1), unless the authorization is terminated  or revoked sooner.       Influenza A by PCR NEGATIVE NEGATIVE Final   Influenza B by PCR NEGATIVE NEGATIVE Final    Comment: (NOTE) The Xpert Xpress SARS-CoV-2/FLU/RSV plus assay is intended as an aid in the diagnosis of influenza from Nasopharyngeal swab specimens and should not be used as a sole basis for treatment. Nasal washings and aspirates are unacceptable for Xpert Xpress SARS-CoV-2/FLU/RSV testing.  Fact Sheet for Patients: EntrepreneurPulse.com.au  Fact Sheet for Healthcare Providers: IncredibleEmployment.be  This test is not yet approved or cleared by the [place] FDA and has been authorized for detection and/or diagnosis of SARS-CoV-2 by FDA under an Emergency Use Authorization (EUA). This EUA will remain in effect (meaning this test can be used) for the duration of the COVID-19 declaration under Section 564(b)(1) of the Act, 21 U.S.C. section 360bbb-3(b)(1), unless the authorization is terminated or revoked.  Performed at [facility], [address] [place], [place] [address]   Blood culture (routine single)     Status: None (Preliminary result)   Collection Time: 10/26/20  8:11 AM   Specimen: BLOOD  Result Value Ref Range Status   Specimen Description BLOOD RIGHT [facility]  Final   Special Requests   Final    BOTTLES DRAWN AEROBIC AND ANAEROBIC Blood Culture adequate volume   Culture   Final    NO GROWTH 2 DAYS Performed at [facility], [address], [place], [place] [address]    Report Status PENDING  Incomplete  Urine culture     Status: Abnormal   Collection Time: 10/26/20  8:11 AM   Specimen: In/Out Cath Urine  Result Value Ref Range Status   Specimen Description   Final    IN/OUT CATH  URINE Performed at [facility], [address] [place], [place] [address]    Special Requests   Final    NONE Performed at [facility], [address], [place], [place] [address]    Culture (A)  Final    6,000 COLONIES/mL KLEBSIELLA PNEUMONIAE 7,000 COLONIES/mL ENTEROCOCCUS FAECALIS    Report Status 10/28/2020 FINAL  Final   Organism ID, Bacteria KLEBSIELLA PNEUMONIAE (A)  Final   Organism ID, Bacteria ENTEROCOCCUS FAECALIS (A)  Final      Susceptibility   Enterococcus faecalis - MIC*    AMPICILLIN <=2 SENSITIVE Sensitive     NITROFURANTOIN <=16 SENSITIVE Sensitive  VANCOMYCIN 1 SENSITIVE Sensitive     * 7,000 COLONIES/mL ENTEROCOCCUS FAECALIS   Klebsiella pneumoniae - MIC*    AMPICILLIN >=32 RESISTANT Resistant     CEFAZOLIN <=4 SENSITIVE Sensitive     CEFEPIME <=0.12 SENSITIVE Sensitive     CEFTRIAXONE <=0.25 SENSITIVE Sensitive     CIPROFLOXACIN <=0.25 SENSITIVE Sensitive     GENTAMICIN <=1 SENSITIVE Sensitive     IMIPENEM <=0.25 SENSITIVE Sensitive     NITROFURANTOIN 64 INTERMEDIATE Intermediate     TRIMETH/SULFA >=320 RESISTANT Resistant     AMPICILLIN/SULBACTAM 4 SENSITIVE Sensitive     PIP/TAZO <=4 SENSITIVE Sensitive     * 6,000 COLONIES/mL KLEBSIELLA PNEUMONIAE      Studies: CT ABDOMEN PELVIS WO CONTRAST  Result Date: 10/27/2020 CLINICAL DATA:  Decreased hemoglobin. Taking Eliquis for atrial fibrillation. Clinical concern for retroperitoneal hematoma. Fell today. EXAM: CT ABDOMEN AND PELVIS WITHOUT CONTRAST TECHNIQUE: Multidetector CT imaging of the abdomen and pelvis was performed following the standard protocol without IV contrast. COMPARISON:  Yesterday. FINDINGS: Lower chest: The heart remains borderline enlarged. No significant change in small bilateral pleural effusions and bilateral lower lobe atelectasis. Stable old, healed right lower anterior rib fractures. Hepatobiliary: No focal liver abnormality is seen. Status post  cholecystectomy. No biliary dilatation. Pancreas: Stable moderate diffuse pancreatic atrophy. Spleen: Stable borderline enlarged spleen. Adrenals/Urinary Tract: Stable mild-to-moderate dilatation of the right renal collecting system with a right ureteral stent in satisfactory position. Retained excreted contrast in the right renal collecting system with mild mucosal edema. Stable multiple bilateral renal calculi. Normal appearing adrenal glands, left ureter and urinary bladder. Stomach/Bowel: Stomach is within normal limits. Appendix appears normal. No evidence of bowel wall thickening, distention, or inflammatory changes. Vascular/Lymphatic: No significant vascular findings are present. No enlarged abdominal or pelvic lymph nodes. Reproductive: Prostate gland grossly normal in size, partially obscured by artifacts from a right hip prosthesis. Other: Anterior abdominal wall surgical wires. Small bilateral inguinal hernias containing fat. Musculoskeletal: Right hip prosthesis with associated streak artifacts. Stable approximately 10% T12 superior endplate compression deformity and mild Schmorl's node formation. No acute fracture lines. Minimal bony retropulsion. IMPRESSION: 1. No retroperitoneal hemorrhage or other interval acute abnormality seen. 2. Stable small bilateral pleural effusions and bilateral lower lobe atelectasis. 3. Stable mild-to-moderate right hydronephrosis with a right ureteral stent in satisfactory position. 4. Stable mucosal edema involving the right renal collecting system, compatible with infection. 5. Stable bilateral renal calculi. Electronically Signed   By: Claudie Revering M.D.   On: 10/27/2020 16:29   MR ANGIO HEAD WO CONTRAST  Result Date: 10/27/2020 CLINICAL DATA:  Stroke workup. EXAM: MRA NECK WITHOUT AND WITH CONTRAST MRA HEAD WITHOUT CONTRAST TECHNIQUE: Multiplanar and multiecho pulse sequences of the neck were obtained without and with intravenous contrast. Angiographic images of  the neck were obtained using MRA technique without and with intravenous contrast; Angiographic images of the Circle of Willis were obtained using MRA technique without intravenous contrast. CONTRAST:  7.50mL GADAVIST GADOBUTROL 1 MMOL/ML IV SOLN COMPARISON:  None. FINDINGS: MRA NECK FINDINGS Motion limited evaluation. Aorta: Great vessel origins are patent. Right carotid system: Patent without evidence of flow limiting stenosis. Left carotid system: Patent without evidence of flow limiting stenosis. Vertebral arteries: Left dominant. Patent without evidence of flow limiting stenosis. MRA HEAD FINDINGS Anterior circulation: Bilateral ICAs, MCAs, and ACAs are patent without proximal flow limiting stenosis. No aneurysm identified. Posterior circulation: Bilateral intradural vertebral arteries, basilar artery and posterior cerebral arteries are patent without proximal flow limiting stenosis.  Prominent bilateral posterior communicating arteries. No aneurysm identified. IMPRESSION: No evidence of a large vessel occlusion or proximal flow limiting stenosis in the head or neck. Electronically Signed   By: Margaretha Sheffield MD   On: 10/27/2020 19:53   MR ANGIO NECK W WO CONTRAST  Result Date: 10/27/2020 CLINICAL DATA:  Stroke workup. EXAM: MRA NECK WITHOUT AND WITH CONTRAST MRA HEAD WITHOUT CONTRAST TECHNIQUE: Multiplanar and multiecho pulse sequences of the neck were obtained without and with intravenous contrast. Angiographic images of the neck were obtained using MRA technique without and with intravenous contrast; Angiographic images of the Circle of Willis were obtained using MRA technique without intravenous contrast. CONTRAST:  7.66mL GADAVIST GADOBUTROL 1 MMOL/ML IV SOLN COMPARISON:  None. FINDINGS: MRA NECK FINDINGS Motion limited evaluation. Aorta: Great vessel origins are patent. Right carotid system: Patent without evidence of flow limiting stenosis. Left carotid system: Patent without evidence of flow limiting  stenosis. Vertebral arteries: Left dominant. Patent without evidence of flow limiting stenosis. MRA HEAD FINDINGS Anterior circulation: Bilateral ICAs, MCAs, and ACAs are patent without proximal flow limiting stenosis. No aneurysm identified. Posterior circulation: Bilateral intradural vertebral arteries, basilar artery and posterior cerebral arteries are patent without proximal flow limiting stenosis. Prominent bilateral posterior communicating arteries. No aneurysm identified. IMPRESSION: No evidence of a large vessel occlusion or proximal flow limiting stenosis in the head or neck. Electronically Signed   By: Margaretha Sheffield MD   On: 10/27/2020 19:53    Scheduled Meds:  atorvastatin  40 mg Oral QHS   finasteride  5 mg Oral Daily   gabapentin  100 mg Oral TID   levothyroxine  100 mcg Oral Q0600   pantoprazole  40 mg Oral Daily   pilocarpine  5 mg Oral TID   pyridoxine  100 mg Oral Daily   sertraline  100 mg Oral Daily   timolol  1 drop Right Eye BID   traZODone  100 mg Oral QHS    Continuous Infusions:  sodium chloride Stopped (10/28/20 1056)   cefTRIAXone (ROCEPHIN)  IV Stopped (10/28/20 1131)     LOS: 0 days     Alma Friendly, MD [facility]  If 7PM-7AM, please contact night-coverage [email or web address] 10/28/2020, 3:47 PM

## 2020-10-28 NOTE — Plan of Care (Signed)

## 2020-10-29 DIAGNOSIS — D649 Anemia, unspecified: Secondary | ICD-10-CM

## 2020-10-29 LAB — CBC WITH DIFFERENTIAL/PLATELET
Abs Immature Granulocytes: 0 10*3/uL (ref 0.00–0.07)
Basophils Absolute: 0 10*3/uL (ref 0.0–0.1)
Basophils Relative: 2 %
Eosinophils Absolute: 0 10*3/uL (ref 0.0–0.5)
Eosinophils Relative: 2 %
HCT: 21.9 % — ABNORMAL LOW (ref 39.0–52.0)
Hemoglobin: 7.5 g/dL — ABNORMAL LOW (ref 13.0–17.0)
Immature Granulocytes: 0 %
Lymphocytes Relative: 32 %
Lymphs Abs: 0.2 10*3/uL — ABNORMAL LOW (ref 0.7–4.0)
MCH: 35.9 pg — ABNORMAL HIGH (ref 26.0–34.0)
MCHC: 34.2 g/dL (ref 30.0–36.0)
MCV: 104.8 fL — ABNORMAL HIGH (ref 80.0–100.0)
Monocytes Absolute: 0 10*3/uL — ABNORMAL LOW (ref 0.1–1.0)
Monocytes Relative: 7 %
Neutro Abs: 0.3 10*3/uL — CL (ref 1.7–7.7)
Neutrophils Relative %: 57 %
Platelets: 141 10*3/uL — ABNORMAL LOW (ref 150–400)
RBC: 2.09 MIL/uL — ABNORMAL LOW (ref 4.22–5.81)
RDW: 22.3 % — ABNORMAL HIGH (ref 11.5–15.5)
Smear Review: NORMAL
WBC: 0.6 10*3/uL — CL (ref 4.0–10.5)
nRBC: 0 % (ref 0.0–0.2)

## 2020-10-29 LAB — BASIC METABOLIC PANEL
Anion gap: 6 (ref 5–15)
BUN: 14 mg/dL (ref 8–23)
CO2: 21 mmol/L — ABNORMAL LOW (ref 22–32)
Calcium: 7.6 mg/dL — ABNORMAL LOW (ref 8.9–10.3)
Chloride: 113 mmol/L — ABNORMAL HIGH (ref 98–111)
Creatinine, Ser: 0.92 mg/dL (ref 0.61–1.24)
GFR, Estimated: >60 mL/min (ref >60)
Glucose, Bld: 112 mg/dL — ABNORMAL HIGH (ref 70–99)
Potassium: 3.6 mmol/L (ref 3.5–5.1)
Sodium: 140 mmol/L (ref 135–145)

## 2020-10-29 MED ORDER — HYDROCODONE-ACETAMINOPHEN 5-325 MG PO TABS
1.0000 | ORAL_TABLET | Freq: Four times a day (QID) | ORAL | Status: DC | PRN
  Administered 2020-10-29 – 2020-11-04 (×17): 1 via ORAL
  Filled 2020-10-29 (×18): qty 1

## 2020-10-29 MED ORDER — SODIUM CHLORIDE 0.9 % IV SOLN: INTRAVENOUS | Status: DC

## 2020-10-29 MED ORDER — ACETAMINOPHEN 325 MG PO TABS
650.0000 mg | ORAL_TABLET | Freq: Four times a day (QID) | ORAL | Status: DC | PRN
  Administered 2020-10-31 – 2020-11-02 (×3): 650 mg via ORAL
  Filled 2020-10-29 (×3): qty 2

## 2020-10-29 MED ORDER — ACETAMINOPHEN 650 MG RE SUPP: 650.0000 mg | Freq: Four times a day (QID) | RECTAL | Status: DC | PRN

## 2020-10-29 NOTE — TOC Progression Note (Signed)
Transition of Care [facility]) - Progression Note    Patient Details  Name: Gordon Farrell MRN: [id number] Date of Birth: [birth year]  Transition of [facility]) CM/SW Contact  Gordon Sessions, RN Phone Number: 10/29/2020, 9:37 AM  Clinical Narrative:    Spoke with wife Gordon Farrell .  Only bed offer at this time [facility].  She accepts this bed offer.  Accepted in [place].  Gordon Farrell at [facility] notified.   If [facility] is able to offer that would like to switch to there.  Voicemail left for Gordon Farrell at [facility]    Expected Discharge Plan: [place] Barriers to Discharge: Continued Medical Work up  Expected Discharge Plan and Services Expected Discharge Plan: [place] Choice: [place] arrangements for the past 2 months: Single Family Home                                       Social Determinants of Health (SDOH) Interventions    Readmission Risk Interventions No flowsheet data found.

## 2020-10-29 NOTE — Progress Notes (Signed)
Unable to provide biotene mouthwash for dry mouth due to out of stock in the hospital. Patient and provider is been informed. Per provider offer ice chip. Will follow the order.

## 2020-10-29 NOTE — Consult Note (Signed)
[place]  [phone] [phone]  ID: Tillie Fantasia OB: 1946/02/10  MR#: [id number]  UXL#:[id number]  Patient Care Team: Danae Orleans, MD as PCP - General (Internal Medicine)  CHIEF COMPLAINT: Pancytopenia.  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  He also has a history of MGUS, but the clinical significance of this is unclear.  He reports a bone marrow biopsy many years ago, but does not recall any results.  He was recently admitted to the hospital after increasing weakness and fatigue and a fall.  Patient was found to have a hemoglobin of 7.7 which is slightly decreased from his baseline likely secondary to a right groin hematoma sustained from recent cardiac catheterization on October 23, 2020.  He continues to have chronic weakness and fatigue, but feels mildly improved since admission.  He has no neurologic complaints.  He denies any recent fevers or illnesses.  He has a fair appetite, but denies weight loss.  He has no chest pain, cough, or hemoptysis.  He continues to have mild shortness of breath/dyspnea on exertion.  He has no nausea, vomiting, constipation, or diarrhea.  He has no urinary complaints.  Patient offers no further specific complaints today.  REVIEW OF SYSTEMS:   Review of Systems  Constitutional:  Positive for malaise/fatigue. Negative for fever and weight loss.  Respiratory:  Positive for shortness of breath. Negative for cough and hemoptysis.   Cardiovascular: Negative.  Negative for chest pain and leg swelling.  Gastrointestinal: Negative.  Negative for abdominal pain, blood in stool and melena.  Genitourinary: Negative.  Negative for dysuria and hematuria.  Musculoskeletal:  Positive for falls. Negative for back pain.  Skin: Negative.  Negative for rash.  Neurological:  Positive for weakness. Negative for dizziness, focal weakness and headaches.   Psychiatric/Behavioral: Negative.  The patient is not nervous/anxious.    As per HPI. Otherwise, a complete review of systems is negative.  PAST MEDICAL HISTORY: Past Medical History:  Diagnosis Date   Anemia    Dysrhythmia    Hypothyroidism     PAST SURGICAL HISTORY: Past Surgical History:  Procedure Laterality Date   BACK SURGERY     CHOLECYSTECTOMY     COLON RESECTION     multiple times   LEFT HEART CATH AND CORONARY ANGIOGRAPHY N/A 10/23/2020   Procedure: LEFT HEART CATH AND CORONARY ANGIOGRAPHY with coronary intervention;  Surgeon: Dionisio David, MD;  Location: [facility];  Service: Cardiovascular;  Laterality: N/A;   TOTAL HIP ARTHROPLASTY  05/06/2010    FAMILY HISTORY: Family History  Problem Relation Age of Onset   Osteoporosis Mother     ADVANCED DIRECTIVES (Y/N):  $Remov'@[id number]$ @  HEALTH MAINTENANCE: Social History   Tobacco Use   Smoking status: Never   Smokeless tobacco: Never  Vaping Use   Vaping Use: Never used  Substance Use Topics   Alcohol use: No    Alcohol/week: 0.0 standard drinks   Drug use: Never     Colonoscopy:  PAP:  Bone density:  Lipid panel:  No Known Allergies  Current Facility-Administered Medications  Medication Dose Route Frequency Provider Last Rate Last Admin   0.9 %  sodium chloride infusion   Intravenous Continuous Alma Friendly, MD       acetaminophen (TYLENOL) tablet 650 mg  650 mg Oral Q6H PRN Alma Friendly, MD       Or   acetaminophen (TYLENOL) suppository 650 mg  650 mg Rectal Q6H PRN Alma Friendly, MD       antiseptic oral rinse (BIOTENE) solution 15 mL  15 mL Mouth Rinse PRN Alma Friendly, MD       atorvastatin (LIPITOR) tablet 40 mg  40 mg Oral QHS Cox, Amy N, DO   40 mg at 10/28/20 2129   cefTRIAXone (ROCEPHIN) 1 g in sodium chloride 0.9 % 100 mL IVPB  1 g Intravenous Q24H Alma Friendly, MD 200 mL/hr at 10/29/20 1012 1 g at 10/29/20 1012   finasteride (PROSCAR) tablet 5  mg  5 mg Oral Daily Cox, Amy N, DO   5 mg at 10/29/20 1012   gabapentin (NEURONTIN) capsule 100 mg  100 mg Oral TID Cox, Amy N, DO   100 mg at 10/29/20 1013   HYDROcodone-acetaminophen (NORCO/VICODIN) 5-325 MG per tablet 1 tablet  1 tablet Oral Q6H PRN Alma Friendly, MD       levothyroxine (SYNTHROID) tablet 100 mcg  100 mcg Oral Q0600 Cox, Amy N, DO   100 mcg at 10/29/20 0525   lip balm (BLISTEX) ointment   Topical PRN Alma Friendly, MD       ondansetron [facility]) tablet 4 mg  4 mg Oral Q6H PRN Cox, Amy N, DO       Or   ondansetron (ZOFRAN) injection 4 mg  4 mg Intravenous Q6H PRN Cox, Amy N, DO   4 mg at 10/26/20 2014   pantoprazole (PROTONIX) EC tablet 40 mg  40 mg Oral Daily Cox, Amy N, DO   40 mg at 10/29/20 1012   pilocarpine (SALAGEN) tablet 5 mg  5 mg Oral TID Cox, Amy N, DO   5 mg at 10/29/20 1315   pyridOXINE (VITAMIN B-6) tablet 100 mg  100 mg Oral Daily Cox, Amy N, DO   100 mg at 10/29/20 1012   sertraline (ZOLOFT) tablet 100 mg  100 mg Oral Daily Cox, Amy N, DO   100 mg at 10/29/20 1013   timolol (TIMOPTIC) 0.5 % ophthalmic solution 1 drop  1 drop Right Eye BID Cox, Amy N, DO   1 drop at 10/29/20 1013   traZODone (DESYREL) tablet 100 mg  100 mg Oral QHS Cox, Amy N, DO   100 mg at 10/28/20 2129    OBJECTIVE: Vitals:   10/29/20 0831 10/29/20 1449  BP: (!) 119/49 (!) 100/56  Pulse: 64   Resp: 14   Temp: 97.9 F (36.6 C)   SpO2:       Body mass index is 27.98 kg/m.    ECOG FS:2 - Symptomatic, <50% confined to bed  General: Well-developed, well-nourished, no acute distress. Eyes: Pink conjunctiva, anicteric sclera. HEENT: Normocephalic, moist mucous membranes. Lungs: No audible wheezing or coughing. Heart: Regular rate and rhythm. Abdomen: Soft, nontender, no obvious distention. Musculoskeletal: No edema, cyanosis, or clubbing. Neuro: Alert, answering all questions appropriately. Cranial nerves grossly intact. Skin: No rashes or petechiae noted. Psych: Normal  affect. Lymphatics: No cervical, calvicular, axillary or inguinal LAD.   LAB RESULTS:  Lab Results  Component Value Date   NA 140 10/29/2020   K 3.6 10/29/2020   CL 113 (H) 10/29/2020   CO2 21 (L) 10/29/2020   GLUCOSE 112 (H) 10/29/2020   BUN 14 10/29/2020   CREATININE 0.92 10/29/2020   CALCIUM 7.6 (L) 10/29/2020   PROT 5.9 (L) 10/26/2020   ALBUMIN 2.6 (L) 10/26/2020   AST 28 10/26/2020   ALT 19 10/26/2020  ALKPHOS 73 10/26/2020   BILITOT 1.3 (H) 10/26/2020   GFRNONAA >60 10/29/2020    Lab Results  Component Value Date   WBC 0.6 (LL) 10/29/2020   NEUTROABS 0.3 (LL) 10/29/2020   HGB 7.5 (L) 10/29/2020   HCT 21.9 (L) 10/29/2020   MCV 104.8 (H) 10/29/2020   PLT 141 (L) 10/29/2020     STUDIES: CT ABDOMEN PELVIS WO CONTRAST  Result Date: 10/27/2020 CLINICAL DATA:  Decreased hemoglobin. Taking Eliquis for atrial fibrillation. Clinical concern for retroperitoneal hematoma. Fell today. EXAM: CT ABDOMEN AND PELVIS WITHOUT CONTRAST TECHNIQUE: Multidetector CT imaging of the abdomen and pelvis was performed following the standard protocol without IV contrast. COMPARISON:  Yesterday. FINDINGS: Lower chest: The heart remains borderline enlarged. No significant change in small bilateral pleural effusions and bilateral lower lobe atelectasis. Stable old, healed right lower anterior rib fractures. Hepatobiliary: No focal liver abnormality is seen. Status post cholecystectomy. No biliary dilatation. Pancreas: Stable moderate diffuse pancreatic atrophy. Spleen: Stable borderline enlarged spleen. Adrenals/Urinary Tract: Stable mild-to-moderate dilatation of the right renal collecting system with a right ureteral stent in satisfactory position. Retained excreted contrast in the right renal collecting system with mild mucosal edema. Stable multiple bilateral renal calculi. Normal appearing adrenal glands, left ureter and urinary bladder. Stomach/Bowel: Stomach is within normal limits. Appendix  appears normal. No evidence of bowel wall thickening, distention, or inflammatory changes. Vascular/Lymphatic: No significant vascular findings are present. No enlarged abdominal or pelvic lymph nodes. Reproductive: Prostate gland grossly normal in size, partially obscured by artifacts from a right hip prosthesis. Other: Anterior abdominal wall surgical wires. Small bilateral inguinal hernias containing fat. Musculoskeletal: Right hip prosthesis with associated streak artifacts. Stable approximately 10% T12 superior endplate compression deformity and mild Schmorl's node formation. No acute fracture lines. Minimal bony retropulsion. IMPRESSION: 1. No retroperitoneal hemorrhage or other interval acute abnormality seen. 2. Stable small bilateral pleural effusions and bilateral lower lobe atelectasis. 3. Stable mild-to-moderate right hydronephrosis with a right ureteral stent in satisfactory position. 4. Stable mucosal edema involving the right renal collecting system, compatible with infection. 5. Stable bilateral renal calculi. Electronically Signed   By: Claudie Revering M.D.   On: 10/27/2020 16:29   DG Chest 2 View  Result Date: 10/20/2020 CLINICAL DATA:  Chest pain EXAM: CHEST - 2 VIEW COMPARISON:  None. FINDINGS: Small bilateral pleural effusions. No focal consolidation. Normal cardiomediastinal silhouette. No pneumothorax. Clips over the right chest. Linear atelectasis left base IMPRESSION: Small bilateral pleural effusions. Electronically Signed   By: Donavan Foil M.D.   On: 10/20/2020 20:35   CT Head Wo Contrast  Result Date: 10/26/2020 CLINICAL DATA:  Minor head trauma EXAM: CT HEAD WITHOUT CONTRAST CT CERVICAL SPINE WITHOUT CONTRAST TECHNIQUE: Multidetector CT imaging of the head and cervical spine was performed following the standard protocol without intravenous contrast. Multiplanar CT image reconstructions of the cervical spine were also generated. COMPARISON:  Brain MRI 06/15/2020 FINDINGS: CT HEAD  FINDINGS Brain: No evidence of acute infarction, hemorrhage, hydrocephalus, extra-axial collection or mass lesion/mass effect. Generalized atrophy Vascular: No hyperdense vessel or unexpected calcification. Skull: Normal. Negative for fracture or focal lesion. Sinuses/Orbits: No acute finding. CT CERVICAL SPINE FINDINGS Alignment: No traumatic malalignment Skull base and vertebrae: No acute fracture. Remote T1 spinous process fracture. Soft tissues and spinal canal: No prevertebral fluid or swelling. No visible canal hematoma. Disc levels: Ordinary disc and facet degeneration which is generalized. Upper chest: Reported separately IMPRESSION: No evidence of intracranial or cervical spine injury. Electronically Signed  By: Monte Fantasia M.D.   On: 10/26/2020 09:53   CT Cervical Spine Wo Contrast  Result Date: 10/26/2020 CLINICAL DATA:  Minor head trauma EXAM: CT HEAD WITHOUT CONTRAST CT CERVICAL SPINE WITHOUT CONTRAST TECHNIQUE: Multidetector CT imaging of the head and cervical spine was performed following the standard protocol without intravenous contrast. Multiplanar CT image reconstructions of the cervical spine were also generated. COMPARISON:  Brain MRI 06/15/2020 FINDINGS: CT HEAD FINDINGS Brain: No evidence of acute infarction, hemorrhage, hydrocephalus, extra-axial collection or mass lesion/mass effect. Generalized atrophy Vascular: No hyperdense vessel or unexpected calcification. Skull: Normal. Negative for fracture or focal lesion. Sinuses/Orbits: No acute finding. CT CERVICAL SPINE FINDINGS Alignment: No traumatic malalignment Skull base and vertebrae: No acute fracture. Remote T1 spinous process fracture. Soft tissues and spinal canal: No prevertebral fluid or swelling. No visible canal hematoma. Disc levels: Ordinary disc and facet degeneration which is generalized. Upper chest: Reported separately IMPRESSION: No evidence of intracranial or cervical spine injury. Electronically Signed   By:  Monte Fantasia M.D.   On: 10/26/2020 09:53   MR ANGIO HEAD WO CONTRAST  Result Date: 10/27/2020 CLINICAL DATA:  Stroke workup. EXAM: MRA NECK WITHOUT AND WITH CONTRAST MRA HEAD WITHOUT CONTRAST TECHNIQUE: Multiplanar and multiecho pulse sequences of the neck were obtained without and with intravenous contrast. Angiographic images of the neck were obtained using MRA technique without and with intravenous contrast; Angiographic images of the Circle of Willis were obtained using MRA technique without intravenous contrast. CONTRAST:  7.1mL GADAVIST GADOBUTROL 1 MMOL/ML IV SOLN COMPARISON:  None. FINDINGS: MRA NECK FINDINGS Motion limited evaluation. Aorta: Great vessel origins are patent. Right carotid system: Patent without evidence of flow limiting stenosis. Left carotid system: Patent without evidence of flow limiting stenosis. Vertebral arteries: Left dominant. Patent without evidence of flow limiting stenosis. MRA HEAD FINDINGS Anterior circulation: Bilateral ICAs, MCAs, and ACAs are patent without proximal flow limiting stenosis. No aneurysm identified. Posterior circulation: Bilateral intradural vertebral arteries, basilar artery and posterior cerebral arteries are patent without proximal flow limiting stenosis. Prominent bilateral posterior communicating arteries. No aneurysm identified. IMPRESSION: No evidence of a large vessel occlusion or proximal flow limiting stenosis in the head or neck. Electronically Signed   By: Margaretha Sheffield MD   On: 10/27/2020 19:53   MR ANGIO NECK W WO CONTRAST  Result Date: 10/27/2020 CLINICAL DATA:  Stroke workup. EXAM: MRA NECK WITHOUT AND WITH CONTRAST MRA HEAD WITHOUT CONTRAST TECHNIQUE: Multiplanar and multiecho pulse sequences of the neck were obtained without and with intravenous contrast. Angiographic images of the neck were obtained using MRA technique without and with intravenous contrast; Angiographic images of the Circle of Willis were obtained using MRA  technique without intravenous contrast. CONTRAST:  7.53mL GADAVIST GADOBUTROL 1 MMOL/ML IV SOLN COMPARISON:  None. FINDINGS: MRA NECK FINDINGS Motion limited evaluation. Aorta: Great vessel origins are patent. Right carotid system: Patent without evidence of flow limiting stenosis. Left carotid system: Patent without evidence of flow limiting stenosis. Vertebral arteries: Left dominant. Patent without evidence of flow limiting stenosis. MRA HEAD FINDINGS Anterior circulation: Bilateral ICAs, MCAs, and ACAs are patent without proximal flow limiting stenosis. No aneurysm identified. Posterior circulation: Bilateral intradural vertebral arteries, basilar artery and posterior cerebral arteries are patent without proximal flow limiting stenosis. Prominent bilateral posterior communicating arteries. No aneurysm identified. IMPRESSION: No evidence of a large vessel occlusion or proximal flow limiting stenosis in the head or neck. Electronically Signed   By: Margaretha Sheffield MD   On: 10/27/2020  19:53   MR BRAIN WO CONTRAST  Result Date: 10/27/2020 CLINICAL DATA:  Initial evaluation for syncope, fall. EXAM: MRI HEAD WITHOUT CONTRAST TECHNIQUE: Multiplanar, multiecho pulse sequences of the brain and surrounding structures were obtained without intravenous contrast. COMPARISON:  Prior CT from earlier the same day. FINDINGS: Brain: Diffuse prominence of the CSF containing spaces compatible generalized cerebral atrophy. Mild chronic microvascular ischemic disease noted involving the periventricular white matter. Punctate 3 mm focus of restricted diffusion seen involving the cortex of the posterior left temporal occipital region (series 5, image 22). Finding consistent with a small acute ischemic infarct. No blood products seen within this region on corresponding SWI sequence or prior CT. No mass effect. No other diffusion abnormality to suggest acute or subacute ischemia. Apparent vague diffusion signal overlying the frontal  convexities on axial DWI sequence favored to be artifactual in nature. Gray-white matter differentiation maintained. No encephalomalacia to suggest chronic cortical infarction. No other evidence for acute or chronic intracranial hemorrhage. No mass lesion, midline shift or mass effect. No hydrocephalus or extra-axial fluid collection. Partially empty sella noted. Suprasellar region normal. Midline structures intact. Vascular: Major intracranial vascular flow voids are maintained. Skull and upper cervical spine: Craniocervical junction within normal limits. Bone marrow signal intensity normal. Probable small contusion noted at the left parietal scalp. Sinuses/Orbits: Sequelae of prior bilateral ocular lens replacement. Globes and orbital soft tissues demonstrate no acute finding. Paranasal sinuses are clear. Small right mastoid effusion noted, of doubtful significance. Inner ear structures grossly normal. Visualized nasopharynx unremarkable. Other: None. IMPRESSION: 1. 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 2. No other acute intracranial abnormality. 3. Mild age-related cerebral atrophy with chronic small vessel ischemic disease. 4. Small left parietal scalp contusion. Electronically Signed   By: Jeannine Boga M.D.   On: 10/27/2020 00:39   MR Lumbar Spine W Wo Contrast  Result Date: 10/27/2020 CLINICAL DATA:  Initial evaluation for numbness and tingling, recent fall. Evaluate for stenosis. EXAM: MRI LUMBAR SPINE WITHOUT AND WITH CONTRAST TECHNIQUE: Multiplanar and multiecho pulse sequences of the lumbar spine were obtained without and with intravenous contrast. CONTRAST:  24mL GADAVIST GADOBUTROL 1 MMOL/ML IV SOLN COMPARISON:  Prior CT from 10/26/2020. FINDINGS: Segmentation: Standard. Lowest well-formed disc space labeled the L5-S1 level. Alignment: Trace retrolisthesis of L4 on L5, with 2 mm anterolisthesis of L5 on S1. Findings  chronic and facet mediated. Alignment otherwise normal preservation of the normal lumbar lordosis. Vertebrae: Chronic T12 compression fracture, partially visualized. Vertebral body height otherwise maintained with no other acute or chronic fracture. Bone marrow signal intensity within normal limits. No discrete or worrisome osseous lesions. No abnormal marrow edema or enhancement. Conus medullaris and cauda equina: Conus extends to the L1-2 level. Conus and cauda equina appear normal. Paraspinal and other soft tissues: Paraspinous soft tissues demonstrate no acute finding. Few scattered benign appearing cyst noted about the kidneys, better evaluated on prior CT. Visualized visceral structures otherwise unremarkable. Disc levels: L1-2: Negative interspace. Mild facet hypertrophy. No stenosis or impingement. L2-3: Mild anterior endplate spurring without significant disc bulge. Mild facet hypertrophy. No canal or foraminal stenosis. L3-4: Disc desiccation with mild annular disc bulge. Mild facet hypertrophy. No canal or foraminal stenosis. No impingement. L4-5: Mild disc bulge with disc desiccation. Moderate left worse than right facet hypertrophy. Superimposed tiny 4 mm cystic lesion noted at the left ligamentum flavum, which could reflect a small synovial cyst versus cystic ligamentous degeneration (series 8, image 29). Resultant mild  narrowing of the lateral recesses bilaterally. Central canal remains patent. Mild left L4 foraminal narrowing. Right neural foramina remains patent. L5-S1: Trace anterolisthesis. Degenerative intervertebral disc space narrowing with disc desiccation and mild disc bulge. Moderate bilateral facet hypertrophy. No canal or lateral recess stenosis. Mild right L5 foraminal narrowing. Left neural foramen remains patent. No frank impingement. IMPRESSION: 1. No evidence for acute injury related to recent fall. 2. Mild disc bulging with moderate facet hypertrophy at L4-5, resulting in mild  bilateral lateral recess stenosis with mild left L4 foraminal narrowing. 3. Trace anterolisthesis of L5 on S1 with associated mild disc bulge and moderate facet hypertrophy, resulting in mild right L5 foraminal stenosis. No other significant stenosis or neural impingement within the lumbar spine. 4. Chronic T12 compression fracture, partially visualized. Electronically Signed   By: Jeannine Boga M.D.   On: 10/27/2020 01:02   CARDIAC CATHETERIZATION  Result Date: 10/23/2020  Mid Cx lesion is 30% stenosed.  Cardiac catheterization revealed 30% disease in the mid left circumflex with normal LAD and right coronary and normal ejection fraction on echocardiogram.  Patient has no significant coronary artery disease and can be discharged with follow-up this Thursday at 9 AM in my office.   CT CHEST ABDOMEN PELVIS W CONTRAST  Result Date: 10/26/2020 CLINICAL DATA:  Fall in the setting of generalized weakness. Low back pain. EXAM: CT CHEST, ABDOMEN, AND PELVIS WITH CONTRAST TECHNIQUE: Multidetector CT imaging of the chest, abdomen and pelvis was performed following the standard protocol during bolus administration of intravenous contrast. CONTRAST:  154mL OMNIPAQUE IOHEXOL 300 MG/ML  SOLN COMPARISON:  None. FINDINGS: CT CHEST FINDINGS Cardiovascular: Normal heart size. No pericardial effusion. Coronary atherosclerosis. Mediastinum/Nodes: Negative for adenopathy or mass. Lungs/Pleura: Dependent atelectasis with small pleural effusions. No pulmonary edema or air leak. No hemothorax. Musculoskeletal: Negative for acute fracture or subluxation. Remote right anterior rib fractures. Remote T1 spinous process fracture. CT ABDOMEN PELVIS FINDINGS Hepatobiliary: No focal liver abnormality.Cholecystectomy. No bile duct dilatation Pancreas: Generalized atrophy. Spleen: No acute finding. Adrenals/Urinary Tract: Negative adrenals. Multiple renal calculi, more numerous on the left where there is generalized involvement and  a dominant 1 cm lower pole stone. On the right the largest stone is at the upper pole and measures 8 mm. Right internal ureteral stent and located position. Mild urothelial thickening at the right ureter which is likely reactive. The bladder is partially obscured by hip prosthesis but unremarkable where seen. Stomach/Bowel:  No obstruction. No visible inflammation Vascular/Lymphatic: Atheromatous calcification of the aorta. Fat stranding around the right common femoral and superficial femoral arteries which is attributed to recent coronary angiography. No discrete hematoma or pseudoaneurysm. No mass or adenopathy. Reproductive:No acute finding. Other: No ascites or pneumoperitoneum. Fatty enlargement of the inguinal canals. Musculoskeletal: Right hip arthroplasty. Multilevel bridging osteophytes in the lower thoracic and lumbar spine. IMPRESSION: 1. No posttraumatic finding. 2. Changes of right femoral access for cardiac catheterization. No hematoma. 3. Dependent atelectasis and trace pleural effusions with mild progression from CT 6 days ago. 4. Bilateral nephrolithiasis. Normally located right-sided ureteral stent. Electronically Signed   By: Monte Fantasia M.D.   On: 10/26/2020 09:49   [place] Lower Ext Art Right Ltd  Result Date: 10/26/2020 CLINICAL DATA:  Right groin pain and swelling. Recent heart catheterization. EXAM: RIGHT LOWER EXTREMITY ARTERIAL DUPLEX - LIMITED TECHNIQUE: Imaging was performed at the area of concern in the right groin. COMPARISON:  None. FINDINGS: Images were obtained over the right groin. Normal triphasic Doppler flow in the right  common femoral artery. There is color Doppler flow in the adjacent common femoral vein. There is no evidence to suggest a pseudoaneurysm or AV fistula. There is a heterogeneous hypoechoic structure in the right groin that is compatible with hematoma that measures 6.2 x 4.5 x 7.2 cm. There is no definite arterial flow within this heterogeneous collection.  IMPRESSION: 1. Right groin hematoma measuring up to 7.2 cm. 2. No evidence for a pseudoaneurysm or AV fistula in the right groin. Electronically Signed   By: Markus Daft M.D.   On: 10/26/2020 11:45   CT L-SPINE NO CHARGE  Result Date: 10/26/2020 CLINICAL DATA:  Fall EXAM: CT Lumbar spine with contrast TECHNIQUE: Multiplanar CT images of the lumbar spine were reconstructed from contemporary CT of the abdomen and pelvis CONTRAST:  No additional COMPARISON:  Correlation made with CT abdomen October 20, 2020 FINDINGS: Segmentation: 5 lumbar type vertebrae. Alignment: Preserved. Vertebrae: Stable lumbar vertebral body heights. Chronic T12 compression fracture. There is no acute fracture. Paraspinal and other soft tissues: Extra-spinal findings are better evaluated on concurrent dedicated imaging. There is no paraspinal hematoma. Right total hip arthroplasty with associated streak artifact. Disc levels: Mild multilevel degenerative changes are present, greatest at L5-S1. No high-grade osseous encroachment on the spinal canal. IMPRESSION: No acute lumbar spine fracture. Electronically Signed   By: Macy Mis M.D.   On: 10/26/2020 09:19   DG Chest Port 1 View  Result Date: 10/26/2020 CLINICAL DATA:  Shortness of breath fell in bathroom EXAM: PORTABLE CHEST 1 VIEW COMPARISON:  10/26/2020 FINDINGS: Clips over the right chest. Stable cardiomediastinal silhouette. Decreased vascular congestion compared to prior. Streaky atelectasis at the bases. No pneumothorax. IMPRESSION: Streaky atelectasis at the bases. Decreased vascular congestion compared to prior. Electronically Signed   By: Donavan Foil M.D.   On: 10/26/2020 15:41   DG Chest Port 1 View  Result Date: 10/26/2020 CLINICAL DATA:  Questionable sepsis. EXAM: PORTABLE CHEST 1 VIEW COMPARISON:  CT 10/20/2020.  Chest x-ray 10/20/2020. FINDINGS: Cardiomegaly with bilateral interstitial prominence and small bilateral effusions. Findings consistent with CHF. Surgical  clips right upper quadrant. Degenerative change and scoliosis thoracic spine. IMPRESSION: Cardiomegaly with bilateral interstitial prominence and small bilateral pleural effusions. Findings consistent with CHF. Electronically Signed   By: Marcello Moores  Register   On: 10/26/2020 08:12   ECHOCARDIOGRAM COMPLETE  Result Date: 10/21/2020    ECHOCARDIOGRAM REPORT   Patient Name:   HARDY HARCUM Date of Exam: 10/21/2020 Medical Rec #:  [id number]     Height:       68.0 in Accession #:    [id number]    Weight:       182.5 lb Date of Birth:  1946-01-06     BSA:          1.966 m Patient Age:    49 years      BP:           124/80 mmHg Patient Gender: M             HR:           109 bpm. Exam Location:  [facility] Procedure: 2D Echo Indications:     Chest Pain R07.9  History:         Patient has no prior history of Echocardiogram examinations.  Sonographer:     Kathlen Brunswick RDCS Referring Phys:  [place] Diagnosing Phys: Neoma Laming MD IMPRESSIONS  1. Left ventricular ejection fraction, by estimation, is 55 to 60%. The left ventricle  has normal function. The left ventricle has no regional wall motion abnormalities. Left ventricular diastolic parameters were normal.  2. Right ventricular systolic function is normal. The right ventricular size is normal.  3. The mitral valve is normal in structure. Trivial mitral valve regurgitation. No evidence of mitral stenosis.  4. The aortic valve is normal in structure. Aortic valve regurgitation is not visualized. Mild aortic valve sclerosis is present, with no evidence of aortic valve stenosis.  5. The inferior vena cava is normal in size with greater than 50% respiratory variability, suggesting right atrial pressure of 3 mmHg. FINDINGS  Left Ventricle: Left ventricular ejection fraction, by estimation, is 55 to 60%. The left ventricle has normal function. The left ventricle has no regional wall motion abnormalities. The left ventricular internal cavity size was normal in size.  There is  borderline left ventricular hypertrophy. Left ventricular diastolic parameters were normal. Right Ventricle: The right ventricular size is normal. No increase in right ventricular wall thickness. Right ventricular systolic function is normal. Left Atrium: Left atrial size was normal in size. Right Atrium: Right atrial size was normal in size. Pericardium: There is no evidence of pericardial effusion. Mitral Valve: The mitral valve is normal in structure. Trivial mitral valve regurgitation. No evidence of mitral valve stenosis. Tricuspid Valve: The tricuspid valve is normal in structure. Tricuspid valve regurgitation is trivial. No evidence of tricuspid stenosis. Aortic Valve: The aortic valve is normal in structure. Aortic valve regurgitation is not visualized. Mild aortic valve sclerosis is present, with no evidence of aortic valve stenosis. Aortic valve peak gradient measures 4.5 mmHg. Pulmonic Valve: The pulmonic valve was normal in structure. Pulmonic valve regurgitation is not visualized. No evidence of pulmonic stenosis. Aorta: The aortic root is normal in size and structure. Venous: The inferior vena cava is normal in size with greater than 50% respiratory variability, suggesting right atrial pressure of 3 mmHg. IAS/Shunts: No atrial level shunt detected by color flow Doppler.  LEFT VENTRICLE PLAX 2D LVIDd:         5.00 cm  Diastology LVIDs:         3.50 cm  LV e' medial:    8.70 cm/s LV PW:         1.30 cm  LV E/e' medial:  9.5 LV IVS:        1.20 cm  LV e' lateral:   10.70 cm/s LVOT diam:     2.00 cm  LV E/e' lateral: 7.7 LV SV:         57 LV SV Index:   29 LVOT Area:     3.14 cm  RIGHT VENTRICLE RV Basal diam:  3.70 cm RV S prime:     11.90 cm/s TAPSE (M-mode): 1.8 cm LEFT ATRIUM             Index       RIGHT ATRIUM           Index LA diam:        3.10 cm 1.58 cm/m  RA Area:     15.20 cm LA Vol (A2C):   37.6 ml 19.13 ml/m RA Volume:   40.10 ml  20.40 ml/m LA Vol (A4C):   49.1 ml 24.98 ml/m  LA Biplane Vol: 44.8 ml 22.79 ml/m  AORTIC VALVE AV Area (Vmax): 2.86 cm AV Vmax:        106.00 cm/s AV Peak Grad:   4.5 mmHg LVOT Vmax:      96.60 cm/s LVOT Vmean:  70.800 cm/s LVOT VTI:       0.183 m  AORTA Ao Root diam: 3.50 cm Ao Asc diam:  3.30 cm MITRAL VALVE MV Area (PHT): 3.58 cm    SHUNTS MV Decel Time: 212 msec    Systemic VTI:  0.18 m MV E velocity: 82.30 cm/s  Systemic Diam: 2.00 cm MV A velocity: 57.40 cm/s MV E/A ratio:  1.43 Neoma Laming MD Electronically signed by Neoma Laming MD Signature Date/Time: 10/21/2020/10:13:09 AM    Final    [place] RT LOWER EXTREM LTD SOFT TISSUE NON VASCULAR  Result Date: 10/26/2020 CLINICAL DATA:  Right groin hematoma EXAM: ULTRASOUND right LOWER EXTREMITY LIMITED TECHNIQUE: Ultrasound examination of the lower extremity soft tissues was performed in the area of clinical concern. COMPARISON:  10/27/2018 FINDINGS: Targeted ultrasound of the right groin performed in the region of previously demonstrated groin hematoma. Heterogeneous hypo and hyperechoic mass in the right groin, this measures 5.9 x 5 x 7.2 cm, previous measurements of 6.2 x 4.5 x 7.2 cm. This is grossly stable in size IMPRESSION: Grossly stable 7.2 cm right groin hematoma. Electronically Signed   By: Donavan Foil M.D.   On: 10/26/2020 18:25   CT Angio Chest/Abd/Pel for Dissection W and/or Wo Contrast  Result Date: 10/20/2020 CLINICAL DATA:  Chest pain today. EXAM: CT ANGIOGRAPHY CHEST, ABDOMEN AND PELVIS TECHNIQUE: Non-contrast CT of the chest was initially obtained. Multidetector CT imaging through the chest, abdomen and pelvis was performed using the standard protocol during bolus administration of intravenous contrast. Multiplanar reconstructed images and MIPs were obtained and reviewed to evaluate the vascular anatomy. CONTRAST:  120mL OMNIPAQUE IOHEXOL 350 MG/ML SOLN COMPARISON:  Radiograph earlier today. FINDINGS: CTA CHEST FINDINGS Cardiovascular: No aortic hematoma noncontrast exam. Thoracic  aorta is normal in caliber. No aneurysm. No dissection, evidence of acute aortic syndrome or vasculitis. The pulmonary arteries are well opacified to the subsegmental level. There is no pulmonary embolus. Heart is normal in size. Trace pericardial effusion. Mild coronary artery calcifications. Mediastinum/Nodes: Calcified left hilar lymph nodes consistent with prior granulomatous disease. No noncalcified adenopathy. Scattered small mediastinal lymph nodes are not enlarged by size criteria. Mild esophageal wall thickening with questionable paraesophageal edema and intraluminal debris in the mid esophagus. No pneumomediastinum. No thyroid nodule. Lungs/Pleura: Small bilateral pleural effusions with adjacent compressive atelectasis. Left lower lobe calcification may be a calcified granuloma or pleural calcification. Mild central bronchial thickening. No pneumothorax. No pulmonary mass. Musculoskeletal: Mild anterior wedging of T1, T3, and T12 vertebral bodies, no adjacent soft tissue thickening or inflammation to suggest acuity. No focal bone lesion. Mild multilevel thoracic spondylosis with endplate spurring. Surgical clips posterior to the right scapula. Review of the MIP images confirms the above findings. CTA ABDOMEN AND PELVIS FINDINGS VASCULAR Aorta: Normal caliber aorta without aneurysm, dissection, vasculitis or significant stenosis. Mild aortic atherosclerosis. Celiac: Patent without evidence of aneurysm, dissection, vasculitis or significant stenosis. Separate origins of the right left hepatic artery from the celiac axis. SMA: Patent without evidence of aneurysm, dissection, vasculitis or significant stenosis. Renals: Both renal arteries are patent without evidence of aneurysm, dissection, vasculitis, fibromuscular dysplasia or significant stenosis. IMA: Patent without evidence of aneurysm, dissection, vasculitis or significant stenosis. Inflow: Patent without evidence of aneurysm, dissection, vasculitis or  significant stenosis. Veins: No obvious venous abnormality within the limitations of this arterial phase study. Review of the MIP images confirms the above findings. NON-VASCULAR Hepatobiliary: No evidence of focal liver abnormality on this arterial exam. Post cholecystectomy. No biliary dilatation.  Pancreas: No ductal dilatation or inflammation. Spleen: Mildly enlarged spanning 13.8 cm cranial caudal. No focal abnormality on arterial phase imaging. Adrenals/Urinary Tract: No adrenal nodule. Right ureteral stent in place with pigtail in the right renal pelvis and urinary bladder. 4 mm stone in the right distal ureter adjacent to the stent, series 5, image 175. Likely tiny stone fragment slightly more proximally, series 5, image 170. Mild stranding about the distal aspect of the ureter and stent. There is mild right hydronephrosis and hydroureter despite stent placement. Nonobstructing 8 mm stone in the upper right kidney. Punctate nonobstructing stone in the mid right kidney. 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 There are multiple nonobstructing intrarenal left renal calculi. Mild thinning of the left renal parenchyma. Small cyst arises from the lower left kidney. There is no left hydronephrosis. No left ureteral stones. Urinary bladder is partially distended, no stones in the bladder. No bladder wall thickening. Stomach/Bowel: Stomach is decompressed. Normal positioning of the duodenum and ligament of Treitz. No small bowel obstruction or inflammation. Normal appendix tentatively visualized. Fluid/liquid stool in the right colon. Air-filled transverse, descending, and proximal sigmoid colon. There is no colonic wall thickening or inflammation. No bowel obstruction. Lymphatic: No abdominopelvic adenopathy. Reproductive: Prostate gland not seen, atrophic or surgically absent. Other: Postsurgical change of the anterior abdominal wall. No ascites. No free air. No focal fluid collection.  Bilateral fat containing inguinal hernias. Musculoskeletal: Right hip arthroplasty. Moderate degenerative change of the left hip. Degenerative change throughout the lumbar spine without acute lumbar abnormality. Review of the MIP images confirms the above findings. IMPRESSION: 1. No aortic dissection or acute aortic abnormality. Mild aortic atherosclerosis. No pulmonary embolus. 2. Small bilateral pleural effusions with adjacent compressive atelectasis. Mild central bronchial thickening. 3. Mild esophageal wall thickening with question of paraesophageal stranding and luminal debris in the mid esophagus. Query esophagitis. Endoscopy may be of value if not recently performed for more detailed esophageal assessment. 4. Right ureteral stent in place with 4 mm stone in the right distal ureter adjacent to the stent. Likely tiny stone fragment slightly more proximally. Mild right hydronephrosis and hydroureter despite stent placement. 5. Additional nonobstructing stones in both kidneys. 6. Mild splenomegaly. 7. Mild anterior wedging of T1, T3, and T12 vertebral bodies, no adjacent soft tissue thickening or inflammation to suggest acuity. 8. Fluid/liquid stool in the right colon, can be seen with diarrheal illness. No colonic wall thickening or inflammation. Aortic Atherosclerosis (ICD10-I70.0). Electronically Signed   By: Keith Rake M.D.   On: 10/20/2020 23:10    ASSESSMENT: Pancytopenia.  PLAN:    Pancytopenia: Although patient's total white blood cell count is only 0.6, this is not too significantly decreased from his baseline.  Suspect he has poor marrow reserve.  No intervention is needed.  Patient does not require Granix at this time.  He may benefit from a bone marrow biopsy in the future, but this can be accomplished as an outpatient if patient is agreeable.  Continue to monitor.  Okay from hematology standpoint to discharge if patient remains afebrile for 24 hours. Anemia: Patient's baseline appears to  be 8-9.  Decreased hemoglobin possibly secondary to hematoma and on Eliquis.  Given patient's cardiac disease, recommend keeping hemoglobin greater than 8.0.  No further interventions are needed. Thrombocytopenia: Mild. Atrial fibrillation: Patient's Eliquis is currently on hold given his recent hematoma.  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  Appreciate consult, will follow.    Lloyd Huger, MD   10/29/2020  3:50 PM

## 2020-10-29 NOTE — Plan of Care (Signed)

## 2020-10-29 NOTE — Progress Notes (Signed)
PROGRESS NOTE  Gordon Farrell AST:[id number] DOB: 10-01-1945 DOA: 10/26/2020 PCP: Danae Orleans, MD  HPI/Recap of past 24 hours: Gordon Farrell is a 75 y.o. male with medical history significant for atrial fibrillation, history of DVT, on Eliquis, hyperlipidemia, hypothyroid, BPH, pancytopenia, MGUS, chronic right upper back pain, presents to the ED for chief concerns of fall. Pt reports that PTA, his legs became so weak and wobbly.  He endorses that he fell in the a.m. on day of admission.  He denies light head trauma and loss of consciousness.  He denies seizure activity. He reports he fell in the hospital. In the ED, vita signs stable, labs showed hemoglobin of 7.7 lower than patient's baseline of around 8, labs fairly stable.  COVID/influenza were negative.  Patient admitted for further management.    Today, patient still complaining of right upper back pain, bilateral lower extremity numbness and some shooting pain up to the back, unchanged from previous.  Patient denies any chest pain, shortness of breath, abdominal pain, nausea/vomiting, fever/chills.    Assessment/Plan: Principal Problem:   Symptomatic anemia Active Problems:   Acquired hypothyroidism   Benign prostatic hyperplasia with urinary obstruction   Compression fracture of lumbar vertebra (HCC)   Crohn's disease of large intestine (HCC)   Deep vein thrombosis (DVT) (HCC)   Unspecified atrial fibrillation (HCC)   CVA (cerebral vascular accident) ([place])   Symptomatic anemia likely 2/2 R groin hematoma present on admission Likely 2/2 recent left heart cath on 10/23/2020 Hgb 7.7 on admission, baseline around 8.1-8.4 Ultrasound ordered by EDP showed right groin hematoma measuring 7.2 cm and no evidence of pseudoaneurysm or AV fistula in the right groin S/p 2 unit of PRBC given on 10/26/20 Discussed with Dr Humphrey Rolls cardiologist about hematoma, rec CT pelvis to r/o retroperitoneal hematoma as well as hold Eliquis CT abd/pelvis  negative for retroperitoneal hemorrhage Continue to hold Eliquis Daily CBC  Possible pyelonephritis (R flank pain) UTI Last temp spike 100.9 on 10/27/2020, with neutropenia UC showed 6000 Klebsiella pneumonia, 7000 Enterococcus faecalis CT abdomen pelvis showed stable mucosal edema involving the right renal collecting system compatible with infection Given neutropenic status, continue Rocephin Monitor fever curve Neutropenic precautions  Acute small ischemic cortical infarct Likely incidental, no focal neurologic deficits Noted to have bilateral lower extremity weakness, falls, syncope MRI brain showed above Echo 10/21/20 with EF of 55-60% MRA H&N unremarkable A1c pending, lipid panel with LDL 39, HDL 17 Neurology consulted, appreciate recs Continue to hold Eliquis for now due to groin hematoma PT/OT Telemetry  P. Atrial fibrillation Eliquis is being held at this time due to acute anemia and hematoma Restart once able   BLE numbness, chronic Noted urinary incontinence  MRI of the lumbar unremarkable Pain management   History of chronic leukopenia and thrombocytopenia Currently neutropenic, precautions ordered Hematologist Dr. Grayland Ormond consulted, appreciate recs  History of DVT Holding eliquis at this time  Hypothyroidism Resumed levothyroxine  Depression/anxiety/insomnia Sertraline, trazodone         Estimated body mass index is 27.98 kg/m as calculated from the following:   Height as of this encounter: 5\' 8"  (1.727 m).   Weight as of this encounter: 83.5 kg.     Code Status: full  Family Communication: Discussed with wife over the phone on 10/28/2020  Disposition Plan: Status is: Inpatient  The patient will require care spanning > 2 midnights and should be moved to inpatient because: Inpatient level of care appropriate due to severity of illness  Dispo: The patient  is from: Home              Anticipated d/c is to: SNF              Patient currently is  not medically stable to d/c.   Difficult to place patient No     Consultants: Neurology  Hematology  Procedures: None  Antimicrobials: Rocephin  DVT prophylaxis: SCD due to hematoma   Objective: Vitals:   10/28/20 1700 10/29/20 0522 10/29/20 0831 10/29/20 1449  BP: 96/66 (!) 101/58 (!) 119/49 (!) 100/56  Pulse: 83 70 64   Resp: 18 16 14    Temp: 98.4 F (36.9 C) 98.2 F (36.8 C) 97.9 F (36.6 C)   TempSrc: Oral  Oral   SpO2: 97% 97%    Weight:      Height:        Intake/Output Summary (Last 24 hours) at 10/29/2020 1640 Last data filed at 10/29/2020 1300 Gross per 24 hour  Intake 610.29 ml  Output 1850 ml  Net -1239.71 ml   Filed Weights   10/26/20 0600  Weight: 83.5 kg    Exam: General: NAD, deconditioned Cardiovascular: S1, S2 present Respiratory: CTAB Abdomen: Soft, nontender, nondistended, bowel sounds present, right groin hematoma noted, tender to palpate.  Right flank pain tenderness Musculoskeletal: No bilateral pedal edema noted Skin: Normal Psychiatry: Normal mood  Neurology: BUE 5/5 strength, BLE 4/5, sensation intact   Data Reviewed: CBC: Recent Labs  Lab 10/26/20 0601 10/26/20 1558 10/26/20 2158 10/27/20 0635 10/27/20 1527 10/28/20 0606 10/28/20 1707 10/29/20 0618  WBC 1.6* 1.8*  --  2.3*  --  1.2*  --  0.6*  NEUTROABS 0.8*  --   --   --   --  0.4*  --  0.3*  HGB 7.7* 7.8*   < > 7.9* 7.8* 7.6* 8.0* 7.5*  HCT 22.7* 23.0*   < > 22.7* 22.4* 22.5* 23.2* 21.9*  MCV 111.3* 106.5*  --  103.7*  --  107.1*  --  104.8*  PLT 198 165  --  153  --  158  --  141*   < > = values in this interval not displayed.   Basic Metabolic Panel: Recent Labs  Lab 10/23/20 0218 10/26/20 0601 10/27/20 0635 10/28/20 0606 10/29/20 0618  NA 140 137 135 136 140  K 3.5 3.1* 3.6 3.6 3.6  CL 111 109 107 109 113*  CO2 23 22 21* 22 21*  GLUCOSE 97 110* 102* 101* 112*  BUN 12 13 20 21 14   CREATININE 0.94 1.06 1.21 1.02 0.92  CALCIUM 7.7* 7.9* 7.7* 7.8*  7.6*  MG 2.0 1.7  --   --   --    GFR: Estimated Creatinine Clearance: 73 mL/min (by C-G formula based on SCr of 0.92 mg/dL). Liver Function Tests: Recent Labs  Lab 10/26/20 0601  AST 28  ALT 19  ALKPHOS 73  BILITOT 1.3*  PROT 5.9*  ALBUMIN 2.6*   No results for input(s): LIPASE, AMYLASE in the last 168 hours.  No results for input(s): AMMONIA in the last 168 hours. Coagulation Profile: Recent Labs  Lab 10/23/20 0218 10/26/20 0601  INR 1.2 1.5*   Cardiac Enzymes: No results for input(s): CKTOTAL, CKMB, CKMBINDEX, TROPONINI in the last 168 hours. BNP (last 3 results) No results for input(s): PROBNP in the last 8760 hours. HbA1C: No results for input(s): HGBA1C in the last 72 hours. CBG: Recent Labs  Lab 10/27/20 2129  GLUCAP 142*   Lipid Profile: Recent  Labs    10/28/20 0606  CHOL 70  HDL 17*  LDLCALC 39  TRIG 71  CHOLHDL 4.1   Thyroid Function Tests: No results for input(s): TSH, T4TOTAL, FREET4, T3FREE, THYROIDAB in the last 72 hours. Anemia Panel: No results for input(s): VITAMINB12, FOLATE, FERRITIN, TIBC, IRON, RETICCTPCT in the last 72 hours. Urine analysis:    Component Value Date/Time   COLORURINE AMBER (A) 10/26/2020 0811   APPEARANCEUR CLOUDY (A) 10/26/2020 0811   LABSPEC 1.017 10/26/2020 0811   PHURINE 5.0 10/26/2020 0811   GLUCOSEU NEGATIVE 10/26/2020 0811   HGBUR LARGE (A) 10/26/2020 0811   BILIRUBINUR NEGATIVE 10/26/2020 0811   KETONESUR NEGATIVE 10/26/2020 0811   PROTEINUR 100 (A) 10/26/2020 0811   NITRITE NEGATIVE 10/26/2020 0811   LEUKOCYTESUR NEGATIVE 10/26/2020 0811   Sepsis Labs: @LABRCNTIP (procalcitonin:4,lacticidven:4)  ) Recent Results (from the past 240 hour(s))  Resp Panel by RT-PCR (Flu A&B, Covid) Nasopharyngeal Swab     Status: None   Collection Time: 10/20/20  8:11 PM   Specimen: Nasopharyngeal Swab; Nasopharyngeal(NP) swabs in vial transport medium  Result Value Ref Range Status   SARS Coronavirus 2 by RT PCR  NEGATIVE NEGATIVE Final    Comment: (NOTE) SARS-CoV-2 target nucleic acids are NOT DETECTED.  The SARS-CoV-2 RNA is generally detectable in upper respiratory specimens during the acute phase of infection. The lowest concentration of SARS-CoV-2 viral copies this assay can detect is 138 copies/mL. A negative result does not preclude SARS-Cov-2 infection and should not be used as the sole basis for treatment or other patient management decisions. A negative result may occur with  improper specimen collection/handling, submission of specimen other than nasopharyngeal swab, presence of viral mutation(s) within the areas targeted by this assay, and inadequate number of viral copies(<138 copies/mL). A negative result must be combined with clinical observations, patient history, and epidemiological information. The expected result is Negative.  Fact Sheet for Patients:  EntrepreneurPulse.com.au  Fact Sheet for Healthcare Providers:  IncredibleEmployment.be  This test is no t yet approved or cleared by the [place] FDA and  has been authorized for detection and/or diagnosis of SARS-CoV-2 by FDA under an Emergency Use Authorization (EUA). This EUA will remain  in effect (meaning this test can be used) for the duration of the COVID-19 declaration under Section 564(b)(1) of the Act, 21 U.S.C.section 360bbb-3(b)(1), unless the authorization is terminated  or revoked sooner.       Influenza A by PCR NEGATIVE NEGATIVE Final   Influenza B by PCR NEGATIVE NEGATIVE Final    Comment: (NOTE) The Xpert Xpress SARS-CoV-2/FLU/RSV plus assay is intended as an aid in the diagnosis of influenza from Nasopharyngeal swab specimens and should not be used as a sole basis for treatment. Nasal washings and aspirates are unacceptable for Xpert Xpress SARS-CoV-2/FLU/RSV testing.  Fact Sheet for Patients: EntrepreneurPulse.com.au  Fact Sheet for  Healthcare Providers: IncredibleEmployment.be  This test is not yet approved or cleared by the [place] FDA and has been authorized for detection and/or diagnosis of SARS-CoV-2 by FDA under an Emergency Use Authorization (EUA). This EUA will remain in effect (meaning this test can be used) for the duration of the COVID-19 declaration under Section 564(b)(1) of the Act, 21 U.S.C. section 360bbb-3(b)(1), unless the authorization is terminated or revoked.  Performed at [facility], [address] [place], [place] [address]   Resp Panel by RT-PCR (Flu A&B, Covid) Nasopharyngeal Swab     Status: None   Collection Time: 10/26/20  6:01 AM  Specimen: Nasopharyngeal Swab; Nasopharyngeal(NP) swabs in vial transport medium  Result Value Ref Range Status   SARS Coronavirus 2 by RT PCR NEGATIVE NEGATIVE Final    Comment: (NOTE) SARS-CoV-2 target nucleic acids are NOT DETECTED.  The SARS-CoV-2 RNA is generally detectable in upper respiratory specimens during the acute phase of infection. The lowest concentration of SARS-CoV-2 viral copies this assay can detect is 138 copies/mL. A negative result does not preclude SARS-Cov-2 infection and should not be used as the sole basis for treatment or other patient management decisions. A negative result may occur with  improper specimen collection/handling, submission of specimen other than nasopharyngeal swab, presence of viral mutation(s) within the areas targeted by this assay, and inadequate number of viral copies(<138 copies/mL). A negative result must be combined with clinical observations, patient history, and epidemiological information. The expected result is Negative.  Fact Sheet for Patients:  EntrepreneurPulse.com.au  Fact Sheet for Healthcare Providers:  IncredibleEmployment.be  This test is no t yet approved or cleared by the [place] FDA and  has been  authorized for detection and/or diagnosis of SARS-CoV-2 by FDA under an Emergency Use Authorization (EUA). This EUA will remain  in effect (meaning this test can be used) for the duration of the COVID-19 declaration under Section 564(b)(1) of the Act, 21 U.S.C.section 360bbb-3(b)(1), unless the authorization is terminated  or revoked sooner.       Influenza A by PCR NEGATIVE NEGATIVE Final   Influenza B by PCR NEGATIVE NEGATIVE Final    Comment: (NOTE) The Xpert Xpress SARS-CoV-2/FLU/RSV plus assay is intended as an aid in the diagnosis of influenza from Nasopharyngeal swab specimens and should not be used as a sole basis for treatment. Nasal washings and aspirates are unacceptable for Xpert Xpress SARS-CoV-2/FLU/RSV testing.  Fact Sheet for Patients: EntrepreneurPulse.com.au  Fact Sheet for Healthcare Providers: IncredibleEmployment.be  This test is not yet approved or cleared by the [place] FDA and has been authorized for detection and/or diagnosis of SARS-CoV-2 by FDA under an Emergency Use Authorization (EUA). This EUA will remain in effect (meaning this test can be used) for the duration of the COVID-19 declaration under Section 564(b)(1) of the Act, 21 U.S.C. section 360bbb-3(b)(1), unless the authorization is terminated or revoked.  Performed at [facility], [address] [place], [place] [address]   Blood culture (routine single)     Status: None (Preliminary result)   Collection Time: 10/26/20  8:11 AM   Specimen: BLOOD  Result Value Ref Range Status   Specimen Description BLOOD RIGHT [facility]  Final   Special Requests   Final    BOTTLES DRAWN AEROBIC AND ANAEROBIC Blood Culture adequate volume   Culture   Final    NO GROWTH 3 DAYS Performed at [facility], [address], [place], [place] [address]    Report Status PENDING  Incomplete  Urine culture     Status: Abnormal   Collection Time: 10/26/20   8:11 AM   Specimen: In/Out Cath Urine  Result Value Ref Range Status   Specimen Description   Final    IN/OUT CATH URINE Performed at [facility], [address] [place], [place] [address]    Special Requests   Final    NONE Performed at [facility], [address] [place], [place] [address]    Culture (A)  Final    6,000 COLONIES/mL KLEBSIELLA PNEUMONIAE 7,000 COLONIES/mL ENTEROCOCCUS FAECALIS    Report Status 10/28/2020 FINAL  Final   Organism  ID, Bacteria KLEBSIELLA PNEUMONIAE (A)  Final   Organism ID, Bacteria ENTEROCOCCUS FAECALIS (A)  Final      Susceptibility   Enterococcus faecalis - MIC*    AMPICILLIN <=2 SENSITIVE Sensitive     NITROFURANTOIN <=16 SENSITIVE Sensitive     VANCOMYCIN 1 SENSITIVE Sensitive     * 7,000 COLONIES/mL ENTEROCOCCUS FAECALIS   Klebsiella pneumoniae - MIC*    AMPICILLIN >=32 RESISTANT Resistant     CEFAZOLIN <=4 SENSITIVE Sensitive     CEFEPIME <=0.12 SENSITIVE Sensitive     CEFTRIAXONE <=0.25 SENSITIVE Sensitive     CIPROFLOXACIN <=0.25 SENSITIVE Sensitive     GENTAMICIN <=1 SENSITIVE Sensitive     IMIPENEM <=0.25 SENSITIVE Sensitive     NITROFURANTOIN 64 INTERMEDIATE Intermediate     TRIMETH/SULFA >=320 RESISTANT Resistant     AMPICILLIN/SULBACTAM 4 SENSITIVE Sensitive     PIP/TAZO <=4 SENSITIVE Sensitive     * 6,000 COLONIES/mL KLEBSIELLA PNEUMONIAE      Studies: No results found.  Scheduled Meds:  atorvastatin  40 mg Oral QHS   finasteride  5 mg Oral Daily   gabapentin  100 mg Oral TID   levothyroxine  100 mcg Oral Q0600   pantoprazole  40 mg Oral Daily   pilocarpine  5 mg Oral TID   pyridoxine  100 mg Oral Daily   sertraline  100 mg Oral Daily   timolol  1 drop Right Eye BID   traZODone  100 mg Oral QHS    Continuous Infusions:  sodium chloride 75 mL/hr at 10/29/20 1624   cefTRIAXone (ROCEPHIN)  IV 1 g (10/29/20 1012)     LOS: 1 day     Alma Friendly, MD [facility]  If  7PM-7AM, please contact night-coverage [email or web address] 10/29/2020, 4:40 PM

## 2020-10-30 LAB — BASIC METABOLIC PANEL
Anion gap: 3 — ABNORMAL LOW (ref 5–15)
BUN: 11 mg/dL (ref 8–23)
CO2: 23 mmol/L (ref 22–32)
Calcium: 7.7 mg/dL — ABNORMAL LOW (ref 8.9–10.3)
Chloride: 113 mmol/L — ABNORMAL HIGH (ref 98–111)
Creatinine, Ser: 0.76 mg/dL (ref 0.61–1.24)
GFR, Estimated: >60 mL/min (ref >60)
Glucose, Bld: 97 mg/dL (ref 70–99)
Potassium: 3.8 mmol/L (ref 3.5–5.1)
Sodium: 139 mmol/L (ref 135–145)

## 2020-10-30 LAB — CBC WITH DIFFERENTIAL/PLATELET
Basophils Absolute: 0 10*3/uL (ref 0.0–0.1)
Basophils Relative: 2 %
Eosinophils Absolute: 0 10*3/uL (ref 0.0–0.5)
Eosinophils Relative: 0 %
HCT: 22.9 % — ABNORMAL LOW (ref 39.0–52.0)
Hemoglobin: 7.7 g/dL — ABNORMAL LOW (ref 13.0–17.0)
Lymphocytes Relative: 49 %
Lymphs Abs: 0.2 10*3/uL — ABNORMAL LOW (ref 0.7–4.0)
MCH: 35.6 pg — ABNORMAL HIGH (ref 26.0–34.0)
MCHC: 33.6 g/dL (ref 30.0–36.0)
MCV: 106 fL — ABNORMAL HIGH (ref 80.0–100.0)
Monocytes Absolute: 0 10*3/uL — ABNORMAL LOW (ref 0.1–1.0)
Monocytes Relative: 6 %
Neutro Abs: 0.2 10*3/uL — CL (ref 1.7–7.7)
Neutrophils Relative %: 43 %
Platelets: 148 10*3/uL — ABNORMAL LOW (ref 150–400)
RBC: 2.16 MIL/uL — ABNORMAL LOW (ref 4.22–5.81)
RDW: 21.4 % — ABNORMAL HIGH (ref 11.5–15.5)
Smear Review: NORMAL
WBC: 0.5 10*3/uL — CL (ref 4.0–10.5)
nRBC: 0 /100 WBC

## 2020-10-30 LAB — TYPE AND SCREEN
ABO/RH(D): AB POS
Antibody Screen: NEGATIVE

## 2020-10-30 LAB — HEMOGLOBIN A1C
Hgb A1c MFr Bld: <4.2 % — ABNORMAL LOW (ref 4.8–5.6)
Mean Plasma Glucose: <74 mg/dL

## 2020-10-30 NOTE — TOC Progression Note (Signed)
Transition of Care [facility]) - Progression Note    Patient Details  Name: Gordon Farrell MRN: [id number] Date of Birth: 1945/10/25  [facility]) CM/SW [place], RN Phone Number: 10/30/2020, 10:38 AM  Clinical Narrative:  Received a call from [facility] this morning that the facility is at capacity and not able to offer bed, Called patient's wife to inform her and she understands that [facility] will be the facility, no discharge order at this time, informed wife I will call when discharge order is written.     Expected Discharge Plan: [place] Barriers to Discharge: Continued Medical Work up  Expected Discharge Plan and Services Expected Discharge Plan: [place] Choice: [place] arrangements for the past 2 months: Single Family Home                                       Social Determinants of Health (SDOH) Interventions    Readmission Risk Interventions No flowsheet data found.

## 2020-10-30 NOTE — Progress Notes (Signed)
critical labs: WBC 0.5, NEU # 0.2 MD made aware.

## 2020-10-30 NOTE — Progress Notes (Signed)
PROGRESS NOTE  Gordon Farrell WSF:[id number] DOB: April 09, 1946 DOA: 10/26/2020 PCP: Danae Orleans, MD  HPI/Recap of past 24 hours: Sahmir Farrell is a 75 y.o. male with medical history significant for atrial fibrillation, history of DVT, on Eliquis, hyperlipidemia, hypothyroid, BPH, pancytopenia, MGUS, chronic right upper back pain, presents to the ED for chief concerns of fall. Pt reports that PTA, his legs became so weak and wobbly.  He endorses that he fell in the a.m. on day of admission.  He denies light head trauma and loss of consciousness.  He denies seizure activity. He reports he fell in the hospital. In the ED, vita signs stable, labs showed hemoglobin of 7.7 lower than patient's baseline of around 8, labs fairly stable.  COVID/influenza were negative.  Patient admitted for further management.    Today, patient still complaining of back and groin pain, with generalized weakness.  Denies any chest pain, shortness of breath, abdominal pain, nausea/vomiting, fever/chills.    Assessment/Plan: Principal Problem:   Symptomatic anemia Active Problems:   Acquired hypothyroidism   Benign prostatic hyperplasia with urinary obstruction   Compression fracture of lumbar vertebra (HCC)   Crohn's disease of large intestine (HCC)   Deep vein thrombosis (DVT) (HCC)   Unspecified atrial fibrillation (HCC)   CVA (cerebral vascular accident) ([place])   Symptomatic anemia likely multifactorial including R groin hematoma present on admission, hx of MGUS Likely 2/2 recent left heart cath on 10/23/2020 Hgb 7.7 on admission, baseline around 8.1-8.4 Ultrasound ordered by EDP showed right groin hematoma measuring 7.2 cm and no evidence of pseudoaneurysm or AV fistula in the right groin S/p 2 unit of PRBC given on 10/26/20 Discussed with Dr Humphrey Rolls cardiologist about hematoma, rec CT pelvis to r/o retroperitoneal hematoma as well as hold Eliquis and would decide to restart after he has been seen in clinic in a  few weeks CT abd/pelvis negative for retroperitoneal hemorrhage Continue to hold Eliquis Daily CBC  Possible pyelonephritis (R flank pain) UTI Last temp spike 100.9 on 10/27/2020, with neutropenia UC showed 6000 Klebsiella pneumonia, 7000 Enterococcus faecalis CT abdomen pelvis showed stable mucosal edema involving the right renal collecting system compatible with infection Given neutropenic status, continue Rocephin Monitor fever curve Neutropenic precautions  Acute small ischemic cortical infarct Likely incidental, no focal neurologic deficits Noted to have bilateral lower extremity weakness, falls, syncope MRI brain showed above Echo 10/21/20 with EF of 55-60% MRA H&N unremarkable A1c pending, lipid panel with LDL 39, HDL 17 Neurology consulted, appreciate recs Continue to hold Eliquis for now due to groin hematoma PT/OT Telemetry  P. Atrial fibrillation Eliquis is being held at this time due to acute anemia and hematoma Restart once able   BLE numbness, chronic Noted urinary incontinence  MRI of the lumbar unremarkable Pain management   History of chronic leukopenia and thrombocytopenia Currently neutropenic, precautions ordered Hematologist Dr. Grayland Ormond consulted, appreciate recs  History of DVT Holding eliquis at this time  Hypothyroidism Resumed levothyroxine  Depression/anxiety/insomnia Sertraline, trazodone         Estimated body mass index is 27.98 kg/m as calculated from the following:   Height as of this encounter: 5\' 8"  (1.727 m).   Weight as of this encounter: 83.5 kg.     Code Status: full  Family Communication: Discussed with wife over the phone on 10/28/2020  Disposition Plan: Status is: Inpatient  The patient will require care spanning > 2 midnights and should be moved to inpatient because: Inpatient level of care appropriate due to  severity of illness  Dispo: The patient is from: Home              Anticipated d/c is to: SNF               Patient currently is not medically stable to d/c.   Difficult to place patient No     Consultants: Neurology  Hematology Cardiology  Procedures: None  Antimicrobials: Rocephin  DVT prophylaxis: SCD due to hematoma   Objective: Vitals:   10/29/20 2227 10/30/20 0613 10/30/20 0759 10/30/20 1706  BP: (!) 118/45 106/73 113/72 128/79  Pulse: 63 97 98 99  Resp: 20 18 16 16   Temp: 98 F (36.7 C) 98.2 F (36.8 C) 97.8 F (36.6 C) 98.1 F (36.7 C)  TempSrc: Oral Oral Oral Oral  SpO2: 99% 98% 97% 99%  Weight:      Height:        Intake/Output Summary (Last 24 hours) at 10/30/2020 1754 Last data filed at 10/30/2020 0147 Gross per 24 hour  Intake 516.54 ml  Output 250 ml  Net 266.54 ml   Filed Weights   10/26/20 0600  Weight: 83.5 kg    Exam: General: NAD, deconditioned Cardiovascular: S1, S2 present Respiratory: CTAB Abdomen: Soft, nontender, nondistended, bowel sounds present, right groin hematoma noted, tender to palpate Musculoskeletal: No bilateral pedal edema noted Skin: Normal Psychiatry: Normal mood  Neurology: BUE 5/5 strength, BLE 4/5, sensation intact   Data Reviewed: CBC: Recent Labs  Lab 10/26/20 0601 10/26/20 1558 10/26/20 2158 10/27/20 0635 10/27/20 1527 10/28/20 0606 10/28/20 1707 10/29/20 0618 10/30/20 0700  WBC 1.6* 1.8*  --  2.3*  --  1.2*  --  0.6* 0.5*  NEUTROABS 0.8*  --   --   --   --  0.4*  --  0.3* 0.2*  HGB 7.7* 7.8*   < > 7.9* 7.8* 7.6* 8.0* 7.5* 7.7*  HCT 22.7* 23.0*   < > 22.7* 22.4* 22.5* 23.2* 21.9* 22.9*  MCV 111.3* 106.5*  --  103.7*  --  107.1*  --  104.8* 106.0*  PLT 198 165  --  153  --  158  --  141* 148*   < > = values in this interval not displayed.   Basic Metabolic Panel: Recent Labs  Lab 10/26/20 0601 10/27/20 0635 10/28/20 0606 10/29/20 0618 10/30/20 0700  NA 137 135 136 140 139  K 3.1* 3.6 3.6 3.6 3.8  CL 109 107 109 113* 113*  CO2 22 21* 22 21* 23  GLUCOSE 110* 102* 101* 112* 97  BUN 13 20  21 14 11   CREATININE 1.06 1.21 1.02 0.92 0.76  CALCIUM 7.9* 7.7* 7.8* 7.6* 7.7*  MG 1.7  --   --   --   --    GFR: Estimated Creatinine Clearance: 84 mL/min (by C-G formula based on SCr of 0.76 mg/dL). Liver Function Tests: Recent Labs  Lab 10/26/20 0601  AST 28  ALT 19  ALKPHOS 73  BILITOT 1.3*  PROT 5.9*  ALBUMIN 2.6*   No results for input(s): LIPASE, AMYLASE in the last 168 hours.  No results for input(s): AMMONIA in the last 168 hours. Coagulation Profile: Recent Labs  Lab 10/26/20 0601  INR 1.5*   Cardiac Enzymes: No results for input(s): CKTOTAL, CKMB, CKMBINDEX, TROPONINI in the last 168 hours. BNP (last 3 results) No results for input(s): PROBNP in the last 8760 hours. HbA1C: Recent Labs    10/28/20 0606  HGBA1C <4.2*   CBG: Recent  Labs  Lab 10/27/20 2129  GLUCAP 142*   Lipid Profile: Recent Labs    10/28/20 0606  CHOL 70  HDL 17*  LDLCALC 39  TRIG 71  CHOLHDL 4.1   Thyroid Function Tests: No results for input(s): TSH, T4TOTAL, FREET4, T3FREE, THYROIDAB in the last 72 hours. Anemia Panel: No results for input(s): VITAMINB12, FOLATE, FERRITIN, TIBC, IRON, RETICCTPCT in the last 72 hours. Urine analysis:    Component Value Date/Time   COLORURINE AMBER (A) 10/26/2020 0811   APPEARANCEUR CLOUDY (A) 10/26/2020 0811   LABSPEC 1.017 10/26/2020 0811   PHURINE 5.0 10/26/2020 0811   GLUCOSEU NEGATIVE 10/26/2020 0811   HGBUR LARGE (A) 10/26/2020 0811   BILIRUBINUR NEGATIVE 10/26/2020 0811   KETONESUR NEGATIVE 10/26/2020 0811   PROTEINUR 100 (A) 10/26/2020 0811   NITRITE NEGATIVE 10/26/2020 0811   LEUKOCYTESUR NEGATIVE 10/26/2020 0811   Sepsis Labs: @LABRCNTIP (procalcitonin:4,lacticidven:4)  ) Recent Results (from the past 240 hour(s))  Resp Panel by RT-PCR (Flu A&B, Covid) Nasopharyngeal Swab     Status: None   Collection Time: 10/20/20  8:11 PM   Specimen: Nasopharyngeal Swab; Nasopharyngeal(NP) swabs in vial transport medium  Result  Value Ref Range Status   SARS Coronavirus 2 by RT PCR NEGATIVE NEGATIVE Final    Comment: (NOTE) SARS-CoV-2 target nucleic acids are NOT DETECTED.  The SARS-CoV-2 RNA is generally detectable in upper respiratory specimens during the acute phase of infection. The lowest concentration of SARS-CoV-2 viral copies this assay can detect is 138 copies/mL. A negative result does not preclude SARS-Cov-2 infection and should not be used as the sole basis for treatment or other patient management decisions. A negative result may occur with  improper specimen collection/handling, submission of specimen other than nasopharyngeal swab, presence of viral mutation(s) within the areas targeted by this assay, and inadequate number of viral copies(<138 copies/mL). A negative result must be combined with clinical observations, patient history, and epidemiological information. The expected result is Negative.  Fact Sheet for Patients:  EntrepreneurPulse.com.au  Fact Sheet for Healthcare Providers:  IncredibleEmployment.be  This test is no t yet approved or cleared by the [place] FDA and  has been authorized for detection and/or diagnosis of SARS-CoV-2 by FDA under an Emergency Use Authorization (EUA). This EUA will remain  in effect (meaning this test can be used) for the duration of the COVID-19 declaration under Section 564(b)(1) of the Act, 21 U.S.C.section 360bbb-3(b)(1), unless the authorization is terminated  or revoked sooner.       Influenza A by PCR NEGATIVE NEGATIVE Final   Influenza B by PCR NEGATIVE NEGATIVE Final    Comment: (NOTE) The Xpert Xpress SARS-CoV-2/FLU/RSV plus assay is intended as an aid in the diagnosis of influenza from Nasopharyngeal swab specimens and should not be used as a sole basis for treatment. Nasal washings and aspirates are unacceptable for Xpert Xpress SARS-CoV-2/FLU/RSV testing.  Fact Sheet for  Patients: EntrepreneurPulse.com.au  Fact Sheet for Healthcare Providers: IncredibleEmployment.be  This test is not yet approved or cleared by the [place] FDA and has been authorized for detection and/or diagnosis of SARS-CoV-2 by FDA under an Emergency Use Authorization (EUA). This EUA will remain in effect (meaning this test can be used) for the duration of the COVID-19 declaration under Section 564(b)(1) of the Act, 21 U.S.C. section 360bbb-3(b)(1), unless the authorization is terminated or revoked.  Performed at [facility], [address] [place], [place] [address]   Resp Panel by RT-PCR (Flu A&B, Covid) Nasopharyngeal Swab  Status: None   Collection Time: 10/26/20  6:01 AM   Specimen: Nasopharyngeal Swab; Nasopharyngeal(NP) swabs in vial transport medium  Result Value Ref Range Status   SARS Coronavirus 2 by RT PCR NEGATIVE NEGATIVE Final    Comment: (NOTE) SARS-CoV-2 target nucleic acids are NOT DETECTED.  The SARS-CoV-2 RNA is generally detectable in upper respiratory specimens during the acute phase of infection. The lowest concentration of SARS-CoV-2 viral copies this assay can detect is 138 copies/mL. A negative result does not preclude SARS-Cov-2 infection and should not be used as the sole basis for treatment or other patient management decisions. A negative result may occur with  improper specimen collection/handling, submission of specimen other than nasopharyngeal swab, presence of viral mutation(s) within the areas targeted by this assay, and inadequate number of viral copies(<138 copies/mL). A negative result must be combined with clinical observations, patient history, and epidemiological information. The expected result is Negative.  Fact Sheet for Patients:  EntrepreneurPulse.com.au  Fact Sheet for Healthcare Providers:  IncredibleEmployment.be  This test is no t  yet approved or cleared by the [place] FDA and  has been authorized for detection and/or diagnosis of SARS-CoV-2 by FDA under an Emergency Use Authorization (EUA). This EUA will remain  in effect (meaning this test can be used) for the duration of the COVID-19 declaration under Section 564(b)(1) of the Act, 21 U.S.C.section 360bbb-3(b)(1), unless the authorization is terminated  or revoked sooner.       Influenza A by PCR NEGATIVE NEGATIVE Final   Influenza B by PCR NEGATIVE NEGATIVE Final    Comment: (NOTE) The Xpert Xpress SARS-CoV-2/FLU/RSV plus assay is intended as an aid in the diagnosis of influenza from Nasopharyngeal swab specimens and should not be used as a sole basis for treatment. Nasal washings and aspirates are unacceptable for Xpert Xpress SARS-CoV-2/FLU/RSV testing.  Fact Sheet for Patients: EntrepreneurPulse.com.au  Fact Sheet for Healthcare Providers: IncredibleEmployment.be  This test is not yet approved or cleared by the [place] FDA and has been authorized for detection and/or diagnosis of SARS-CoV-2 by FDA under an Emergency Use Authorization (EUA). This EUA will remain in effect (meaning this test can be used) for the duration of the COVID-19 declaration under Section 564(b)(1) of the Act, 21 U.S.C. section 360bbb-3(b)(1), unless the authorization is terminated or revoked.  Performed at [facility], [address] [place], [place] [address]   Blood culture (routine single)     Status: None (Preliminary result)   Collection Time: 10/26/20  8:11 AM   Specimen: BLOOD  Result Value Ref Range Status   Specimen Description BLOOD RIGHT [facility]  Final   Special Requests   Final    BOTTLES DRAWN AEROBIC AND ANAEROBIC Blood Culture adequate volume   Culture   Final    NO GROWTH 4 DAYS Performed at [facility], [address] [place], [place] [address]    Report Status PENDING  Incomplete   Urine culture     Status: Abnormal   Collection Time: 10/26/20  8:11 AM   Specimen: In/Out Cath Urine  Result Value Ref Range Status   Specimen Description   Final    IN/OUT CATH URINE Performed at [facility], [address] [place], [place] [address]    Special Requests   Final    NONE Performed at [facility], [address] [place],  [address]    Culture (A)  Final    6,000 COLONIES/mL KLEBSIELLA PNEUMONIAE 7,000 COLONIES/mL ENTEROCOCCUS FAECALIS  Report Status 10/28/2020 FINAL  Final   Organism ID, Bacteria KLEBSIELLA PNEUMONIAE (A)  Final   Organism ID, Bacteria ENTEROCOCCUS FAECALIS (A)  Final      Susceptibility   Enterococcus faecalis - MIC*    AMPICILLIN <=2 SENSITIVE Sensitive     NITROFURANTOIN <=16 SENSITIVE Sensitive     VANCOMYCIN 1 SENSITIVE Sensitive     * 7,000 COLONIES/mL ENTEROCOCCUS FAECALIS   Klebsiella pneumoniae - MIC*    AMPICILLIN >=32 RESISTANT Resistant     CEFAZOLIN <=4 SENSITIVE Sensitive     CEFEPIME <=0.12 SENSITIVE Sensitive     CEFTRIAXONE <=0.25 SENSITIVE Sensitive     CIPROFLOXACIN <=0.25 SENSITIVE Sensitive     GENTAMICIN <=1 SENSITIVE Sensitive     IMIPENEM <=0.25 SENSITIVE Sensitive     NITROFURANTOIN 64 INTERMEDIATE Intermediate     TRIMETH/SULFA >=320 RESISTANT Resistant     AMPICILLIN/SULBACTAM 4 SENSITIVE Sensitive     PIP/TAZO <=4 SENSITIVE Sensitive     * 6,000 COLONIES/mL KLEBSIELLA PNEUMONIAE      Studies: No results found.  Scheduled Meds:  atorvastatin  40 mg Oral QHS   finasteride  5 mg Oral Daily   gabapentin  100 mg Oral TID   levothyroxine  100 mcg Oral Q0600   pantoprazole  40 mg Oral Daily   pilocarpine  5 mg Oral TID   pyridoxine  100 mg Oral Daily   sertraline  100 mg Oral Daily   timolol  1 drop Right Eye BID   traZODone  100 mg Oral QHS    Continuous Infusions:  cefTRIAXone (ROCEPHIN)  IV Stopped (10/30/20 0902)     LOS: 2 days     Alma Friendly,  MD [facility]  If 7PM-7AM, please contact night-coverage [email or web address] 10/30/2020, 5:54 PM

## 2020-10-30 NOTE — Progress Notes (Signed)
SUBJECTIVE: Patient denies chest pain and shortness of breath. Patient complaining of right toe pain.    Vitals:   10/29/20 1449 10/29/20 2227 10/30/20 0613 10/30/20 0759  BP: (!) 100/56 (!) 118/45 106/73 113/72  Pulse:  63 97 98  Resp:  20 18 16   Temp:  98 F (36.7 C) 98.2 F (36.8 C) 97.8 F (36.6 C)  TempSrc:  Oral Oral Oral  SpO2:  99% 98% 97%  Weight:      Height:        Intake/Output Summary (Last 24 hours) at 10/30/2020 0900 Last data filed at 10/30/2020 0147 Gross per 24 hour  Intake 662.88 ml  Output 800 ml  Net -137.12 ml    LABS: Basic Metabolic Panel: Recent Labs    10/29/20 0618 10/30/20 0700  NA 140 139  K 3.6 3.8  CL 113* 113*  CO2 21* 23  GLUCOSE 112* 97  BUN 14 11  CREATININE 0.92 0.76  CALCIUM 7.6* 7.7*   Liver Function Tests: No results for input(s): AST, ALT, ALKPHOS, BILITOT, PROT, ALBUMIN in the last 72 hours. No results for input(s): LIPASE, AMYLASE in the last 72 hours. CBC: Recent Labs    10/29/20 0618 10/30/20 0700  WBC 0.6* PENDING  NEUTROABS 0.3* PENDING  HGB 7.5* 7.7*  HCT 21.9* 22.9*  MCV 104.8* 106.0*  PLT 141* 148*   Cardiac Enzymes: No results for input(s): CKTOTAL, CKMB, CKMBINDEX, TROPONINI in the last 72 hours. BNP: Invalid input(s): POCBNP D-Dimer: No results for input(s): DDIMER in the last 72 hours. Hemoglobin A1C: No results for input(s): HGBA1C in the last 72 hours. Fasting Lipid Panel: Recent Labs    10/28/20 0606  CHOL 70  HDL 17*  LDLCALC 39  TRIG 71  CHOLHDL 4.1   Thyroid Function Tests: No results for input(s): TSH, T4TOTAL, T3FREE, THYROIDAB in the last 72 hours.  Invalid input(s): FREET3 Anemia Panel: No results for input(s): VITAMINB12, FOLATE, FERRITIN, TIBC, IRON, RETICCTPCT in the last 72 hours.   PHYSICAL EXAM General: Well developed, well nourished, in no acute distress HEENT:  Normocephalic and atramatic Neck:  No JVD.  Lungs: Clear bilaterally to auscultation and  percussion. Heart: HRRR . Normal S1 and S2 without gallops or murmurs.  Abdomen: Bowel sounds are positive, abdomen soft and non-tender  Msk:  Back normal, normal gait. Normal strength and tone for age. Extremities: No clubbing, cyanosis or edema.   Neuro: Alert and oriented X 3. Psych:  Good affect, responds appropriately  TELEMETRY: atrial fibrilattion  ASSESSMENT AND PLAN: Pateint has history of atrial fibrillation with recent cardiac cath with no significant CAD with small right groin hematoma. Patient doing fine after blood transfusion. Denies chest pain.   Principal Problem:   Symptomatic anemia Active Problems:   Acquired hypothyroidism   Benign prostatic hyperplasia with urinary obstruction   Compression fracture of lumbar vertebra (HCC)   Crohn's disease of large intestine (HCC)   Deep vein thrombosis (DVT) (HCC)   Unspecified atrial fibrillation (HCC)   CVA (cerebral vascular accident) ([place])    Gordon Pyles A, MD, [facility] 10/30/2020 9:00 AM

## 2020-10-31 LAB — CBC WITH DIFFERENTIAL/PLATELET
Abs Immature Granulocytes: 0 10*3/uL (ref 0.00–0.07)
Basophils Absolute: 0 10*3/uL (ref 0.0–0.1)
Basophils Relative: 0 %
Eosinophils Absolute: 0 10*3/uL (ref 0.0–0.5)
Eosinophils Relative: 0 %
HCT: 25 % — ABNORMAL LOW (ref 39.0–52.0)
Hemoglobin: 8.4 g/dL — ABNORMAL LOW (ref 13.0–17.0)
Immature Granulocytes: 0 %
Lymphocytes Relative: 52 %
Lymphs Abs: 0.3 10*3/uL — ABNORMAL LOW (ref 0.7–4.0)
MCH: 35.9 pg — ABNORMAL HIGH (ref 26.0–34.0)
MCHC: 33.6 g/dL (ref 30.0–36.0)
MCV: 106.8 fL — ABNORMAL HIGH (ref 80.0–100.0)
Monocytes Absolute: 0 10*3/uL — ABNORMAL LOW (ref 0.1–1.0)
Monocytes Relative: 6 %
Neutro Abs: 0.2 10*3/uL — CL (ref 1.7–7.7)
Neutrophils Relative %: 42 %
Platelets: 162 10*3/uL (ref 150–400)
RBC: 2.34 MIL/uL — ABNORMAL LOW (ref 4.22–5.81)
RDW: 20.5 % — ABNORMAL HIGH (ref 11.5–15.5)
Smear Review: NORMAL
WBC: 0.5 10*3/uL — CL (ref 4.0–10.5)
nRBC: 0 % (ref 0.0–0.2)

## 2020-10-31 LAB — PROTEIN ELECTROPHORESIS, SERUM
A/G Ratio: 0.8 (ref 0.7–1.7)
Albumin ELP: 2 g/dL — ABNORMAL LOW (ref 2.9–4.4)
Alpha-1-Globulin: 0.4 g/dL (ref 0.0–0.4)
Alpha-2-Globulin: 0.7 g/dL (ref 0.4–1.0)
Beta Globulin: 0.7 g/dL (ref 0.7–1.3)
Gamma Globulin: 0.9 g/dL (ref 0.4–1.8)
Globulin, Total: 2.6 g/dL (ref 2.2–3.9)
M-Spike, %: 0.1 g/dL — ABNORMAL HIGH
Total Protein ELP: 4.6 g/dL — ABNORMAL LOW (ref 6.0–8.5)

## 2020-10-31 LAB — CULTURE, BLOOD (SINGLE)
Culture: NO GROWTH
Special Requests: ADEQUATE

## 2020-10-31 LAB — KAPPA/LAMBDA LIGHT CHAINS
Kappa free light chain: 48.2 mg/L — ABNORMAL HIGH (ref 3.3–19.4)
Kappa, lambda light chain ratio: 0.89 (ref 0.26–1.65)
Lambda free light chains: 54 mg/L — ABNORMAL HIGH (ref 5.7–26.3)

## 2020-10-31 LAB — IGG, IGA, IGM
IgA: 179 mg/dL (ref 61–437)
IgG (Immunoglobin G), Serum: 864 mg/dL (ref 603–1613)
IgM (Immunoglobulin M), Srm: 83 mg/dL (ref 15–143)

## 2020-10-31 MED ORDER — APIXABAN 2.5 MG PO TABS
2.5000 mg | ORAL_TABLET | Freq: Two times a day (BID) | ORAL | Status: DC
  Administered 2020-10-31 – 2020-11-04 (×8): 2.5 mg via ORAL
  Filled 2020-10-31 (×8): qty 1

## 2020-10-31 NOTE — Progress Notes (Signed)
Occupational Therapy Treatment Patient Details Name: Gordon Farrell MRN: [id number] DOB: 12/05/1945 Today's Date: 10/31/2020    History of present illness Pt is a 75 y/o M admitted on 10/26/20 after a fall. Pt being treated for symptomatic anemia, suspect 2/2 groin hematoma present on admission, suspect 2/2 to recent heart cath on 10/23/20. 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 PMH: a-fib, DVT, on eliquis, HLD, hypothyroid, BPH, pancytopenia, MGUS, chronic R upper back pain   OT comments  Upon entering the room, pt supine in bed with 8/10 back pain reported. OT provided pt with warm cloth for face and encouraged pt to participate in OT intervention and he was agreeable. Pt reports frequent urgency for toileting and performs bed mobility with min A. Pt standing from EOB with mod lifting assistance. Pt with urgency at this time and able to perform clothing management with min A for balance.  Pt then returns to bed with min A. Pt reports fatigue from therapeutic intervention. Pt also endorses pain remains at an 8/10 with it having not increased or decreased with standing task. Pt continues to benefit from OT intervention and continue to recommend short term rehab at discharge to address functional deficits before returning home.   Follow Up Recommendations  SNF    Equipment Recommendations  Other (comment) (defer to next venue of care)       Precautions / Restrictions Precautions Precautions: Fall       Mobility Bed Mobility Overal bed mobility: Needs Assistance Bed Mobility: Supine to Sit;Sit to Supine     Supine to sit: Min assist Sit to supine: Min assist   General bed mobility comments: min A for trunk control with HOB elevated to come to EOB with min cuing for technique    Transfers Overall transfer level: Needs assistance Equipment used: 1 person hand held assist Transfers: Sit  to/from Stand Sit to Stand: Mod assist         General transfer comment: mod lifting assist    Balance Overall balance assessment: Needs assistance Sitting-balance support: Feet supported;Bilateral upper extremity supported Sitting balance-Leahy Scale: Good     Standing balance support: During functional activity Standing balance-Leahy Scale: Fair                             ADL either performed or assessed with clinical judgement   ADL Overall ADL's : Needs assistance/impaired     Grooming: Wash/dry hands;Wash/dry face;Set up;Bed level                                 General ADL Comments: Pt needing mod lifting assist from bed height and min A for standing balance and assist for clothing management while using urinal.     Vision Baseline Vision/History: Wears glasses Wears Glasses: At all times Patient Visual Report: No change from baseline            Cognition Arousal/Alertness: Awake/alert Behavior During Therapy: WFL for tasks assessed/performed Overall Cognitive Status: Within Functional Limits for tasks assessed  Pertinent Vitals/ Pain       Pain Assessment: 0-10 Pain Score: 8  Pain Location: back Pain Descriptors / Indicators: Aching;Discomfort;Grimacing Pain Intervention(s): Limited activity within patient's tolerance;Monitored during session;Premedicated before session;Repositioned     Prior Functioning/Environment              Frequency  Min 2X/week        Progress Toward Goals  OT Goals(current goals can now be found in the care plan section)  Progress towards OT goals: Progressing toward goals  Acute Rehab OT Goals Patient Stated Goal: decreased pain OT Goal Formulation: With patient Time For Goal Achievement: 11/10/20 Potential to Achieve Goals: Good  Plan Discharge plan remains appropriate;Frequency needs to be updated        AM-PAC OT "6 Clicks" Daily Activity     Outcome Measure   Help from another person eating meals?: A Little Help from another person taking care of personal grooming?: A Little Help from another person toileting, which includes using toliet, bedpan, or urinal?: A Little Help from another person bathing (including washing, rinsing, drying)?: A Lot Help from another person to put on and taking off regular upper body clothing?: A Little Help from another person to put on and taking off regular lower body clothing?: A Lot 6 Click Score: 16    End of Session    OT Visit Diagnosis: Unsteadiness on feet (R26.81);History of falling (Z91.81);Other abnormalities of gait and mobility (R26.89);Muscle weakness (generalized) (M62.81);Pain   Activity Tolerance Patient limited by pain;Patient tolerated treatment well   Patient Left in bed;with call bell/phone within reach;with bed alarm set     Time: 1436-1500 OT Time Calculation (min): 24 min  Charges: OT General Charges $OT Visit: 1 Visit OT Treatments $Self Care/Home Management : 23-37 mins  Darleen Crocker, MS, OTR/L , CBIS ascom [phone]  10/31/20, 3:28 PM

## 2020-10-31 NOTE — Progress Notes (Signed)
SUBJECTIVE: Patient denies chest pain and shortness of breath. Patient reports unchanged back pain and bilateral toe numbness.   Vitals:   10/30/20 1706 10/30/20 2031 10/31/20 0506 10/31/20 0819  BP: 128/79 120/77 (!) 113/57 111/74  Pulse: 99 79 95 97  Resp: 16 18 16 16   Temp: 98.1 F (36.7 C) 98.1 F (36.7 C) 97.8 F (36.6 C) 98.1 F (36.7 C)  TempSrc: Oral Oral Oral Oral  SpO2: 99% 98% 97% 97%  Weight:      Height:        Intake/Output Summary (Last 24 hours) at 10/31/2020 0856 Last data filed at 10/31/2020 0556 Gross per 24 hour  Intake --  Output 1400 ml  Net -1400 ml    LABS: Basic Metabolic Panel: Recent Labs    10/29/20 0618 10/30/20 0700  NA 140 139  K 3.6 3.8  CL 113* 113*  CO2 21* 23  GLUCOSE 112* 97  BUN 14 11  CREATININE 0.92 0.76  CALCIUM 7.6* 7.7*   Liver Function Tests: No results for input(s): AST, ALT, ALKPHOS, BILITOT, PROT, ALBUMIN in the last 72 hours. No results for input(s): LIPASE, AMYLASE in the last 72 hours. CBC: Recent Labs    10/30/20 0700 10/31/20 0507  WBC 0.5* 0.5*  NEUTROABS 0.2* 0.2*  HGB 7.7* 8.4*  HCT 22.9* 25.0*  MCV 106.0* 106.8*  PLT 148* 162   Cardiac Enzymes: No results for input(s): CKTOTAL, CKMB, CKMBINDEX, TROPONINI in the last 72 hours. BNP: Invalid input(s): POCBNP D-Dimer: No results for input(s): DDIMER in the last 72 hours. Hemoglobin A1C: No results for input(s): HGBA1C in the last 72 hours. Fasting Lipid Panel: No results for input(s): CHOL, HDL, LDLCALC, TRIG, CHOLHDL, LDLDIRECT in the last 72 hours. Thyroid Function Tests: No results for input(s): TSH, T4TOTAL, T3FREE, THYROIDAB in the last 72 hours.  Invalid input(s): FREET3 Anemia Panel: No results for input(s): VITAMINB12, FOLATE, FERRITIN, TIBC, IRON, RETICCTPCT in the last 72 hours.   PHYSICAL EXAM General: Well developed, well nourished, in no acute distress HEENT:  Normocephalic and atramatic Neck:  No JVD.  Lungs: Clear bilaterally  to auscultation and percussion. Heart: HRRR . Normal S1 and S2 without gallops or murmurs.  Abdomen: Bowel sounds are positive, abdomen soft and non-tender  Msk:  Back normal, normal gait. Normal strength and tone for age. Extremities: No clubbing, cyanosis or edema.   Neuro: Alert and oriented X 3. Psych:  Good affect, responds appropriately  TELEMETRY: atrial fibrillation  ASSESSMENT AND PLAN: Patient denies chest pain and shortness of breath. Patient to continue to hold Eliquis for now. We will consider starting Eliquis on follow up in the office if hemoglobin is stable on discharge from the hospital.   Principal Problem:   Symptomatic anemia Active Problems:   Acquired hypothyroidism   Benign prostatic hyperplasia with urinary obstruction   Compression fracture of lumbar vertebra (HCC)   Crohn's disease of large intestine (HCC)   Deep vein thrombosis (DVT) (HCC)   Unspecified atrial fibrillation (HCC)   CVA (cerebral vascular accident) ([place])    Neoma Laming A, MD, [facility] 10/31/2020 8:56 AM

## 2020-10-31 NOTE — Care Management Important Message (Signed)
Important Message  Patient Details  Name: Gordon Farrell MRN: [id number] Date of Birth: November 02, 1945   Medicare Important Message Given:  Yes     Dannette Barbara 10/31/2020, 10:45 AM

## 2020-10-31 NOTE — Progress Notes (Signed)
Physical Therapy Treatment Patient Details Name: Gordon Farrell MRN: [id number] DOB: Apr 12, 1946 Today's Date: 10/31/2020    History of Present Illness Pt is a 75 y/o M admitted on 10/26/20 after a fall. Pt being treated for symptomatic anemia, suspect 2/2 groin hematoma present on admission, suspect 2/2 to recent heart cath on 10/23/20. 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 PMH: a-fib, DVT, on eliquis, HLD, hypothyroid, BPH, pancytopenia, MGUS, chronic R upper back pain.    PT Comments    Pt resting in bed upon PT arrival; agreeable to PT session.  5/10 upper R back pain beginning of session at rest and 8/10 end of session at rest (nurse notified--pt not due for pain meds yet).  SBA with bed mobility; CGA with transfers; and CGA ambulating 40 feet x2 with RW.  Distance ambulated limited d/t increasing SOB (O2 sats at least 94% on room air during session) and pt requiring sitting rest break between ambulation trials (d/t SOB and fatigue).  Pt requiring pacing during session.  Will continue to focus on strengthening, increasing activity tolerance and endurance, and progressive functional mobility per pt tolerance.    Follow Up Recommendations  SNF     Equipment Recommendations  Rolling walker with 5" wheels;3in1 (PT)    Recommendations for Other Services       Precautions / Restrictions Precautions Precautions: Fall Restrictions Weight Bearing Restrictions: No    Mobility  Bed Mobility Overal bed mobility: Needs Assistance Bed Mobility: Supine to Sit;Sit to Supine     Supine to sit: Supervision;HOB elevated Sit to supine: Supervision;HOB elevated   General bed mobility comments: increased time to perform on own    Transfers Overall transfer level: Needs assistance Equipment used: Rolling walker (2 wheeled) Transfers: Sit to/from Stand Sit to Stand: Min guard          General transfer comment: vc's for UE placement; increased effort to stand noted  Ambulation/Gait Ambulation/Gait assistance: Min guard Gait Distance (Feet):  (40 feet x2) Assistive device: Rolling walker (2 wheeled)   Gait velocity: decreased   General Gait Details: partial step through gait pattern; limited distance ambulating d/t SOB   Stairs             Wheelchair Mobility    Modified Rankin (Stroke Patients Only)       Balance Overall balance assessment: Needs assistance Sitting-balance support: No upper extremity supported;Feet supported Sitting balance-Leahy Scale: Good Sitting balance - Comments: steady sitting reaching within BOS   Standing balance support: Single extremity supported Standing balance-Leahy Scale: Fair Standing balance comment: pt requiring at least single UE support for static standing balance                            Cognition Arousal/Alertness: Awake/alert Behavior During Therapy: WFL for tasks assessed/performed Overall Cognitive Status: Within Functional Limits for tasks assessed                                        Exercises      General Comments  Nursing cleared pt for participation in physical therapy.  Pt agreeable to PT session.      Pertinent Vitals/Pain Pain Assessment: 0-10 Pain Score: 8  Pain Location: R upper back pain Pain Descriptors / Indicators: Aching;Discomfort;Grimacing Pain Intervention(s): Limited activity  within patient's tolerance;Monitored during session;Repositioned;Other (comment) (RN notified (pt not due for pain medication yet)) Vitals (HR and O2 on room air) stable and WFL throughout treatment session.    Home Living                      Prior Function            PT Goals (current goals can now be found in the care plan section) Acute Rehab PT Goals Patient Stated Goal: decrease pain PT Goal Formulation: With patient Time For Goal Achievement:  11/10/20 Potential to Achieve Goals: Fair Progress towards PT goals: Progressing toward goals    Frequency    Min 2X/week      PT Plan Current plan remains appropriate    Co-evaluation              AM-PAC PT "6 Clicks" Mobility   Outcome Measure  Help needed turning from your back to your side while in a flat bed without using bedrails?: A Little Help needed moving from lying on your back to sitting on the side of a flat bed without using bedrails?: A Little Help needed moving to and from a bed to a chair (including a wheelchair)?: A Little Help needed standing up from a chair using your arms (e.g., wheelchair or bedside chair)?: A Little Help needed to walk in hospital room?: A Little Help needed climbing 3-5 steps with a railing? : A Lot 6 Click Score: 17    End of Session Equipment Utilized During Treatment: Gait belt Activity Tolerance: Patient limited by fatigue Patient left: in bed;with call bell/phone within reach;with bed alarm set Nurse Communication: Mobility status;Precautions;Other (comment) (pt's pain status) PT Visit Diagnosis: Difficulty in walking, not elsewhere classified (R26.2);Muscle weakness (generalized) (M62.81);Pain Pain - Right/Left: Right Pain - part of body:  (upper back)     Time: 4982-6415 PT Time Calculation (min) (ACUTE ONLY): 29 min  Charges:  $Therapeutic Exercise: 8-22 mins $Therapeutic Activity: 8-22 mins                     Leitha Bleak, PT 10/31/20, 5:15 PM

## 2020-10-31 NOTE — TOC Progression Note (Signed)
Transition of Care [facility]) - Progression Note    Patient Details  Name: Gordon Farrell MRN: [id number] Date of Birth: 05-01-1946  Transition of Care [facility]) CM/SW Contact  Kerin Salen, RN Phone Number: 10/31/2020, 10:08 AM  Clinical Narrative:  [facility] called, [phone] called to request that all clinicals to include H&P, Triage Notes, PT,OT and Attending notes be faxed to [phone], will review and provide authorization for SNF, if not will require Peer to Peer. Request completed.     Expected Discharge Plan: [place] Barriers to Discharge: Continued Medical Work up  Expected Discharge Plan and Services Expected Discharge Plan: [place] Choice: [place] arrangements for the past 2 months: Single Family Home                                       Social Determinants of Health (SDOH) Interventions    Readmission Risk Interventions No flowsheet data found.

## 2020-10-31 NOTE — Progress Notes (Addendum)
PROGRESS NOTE  Gordon Farrell TKZ:[id number] DOB: 1946-01-24 DOA: 10/26/2020 PCP: Danae Orleans, MD  HPI/Recap of past 24 hours: Gordon Farrell is a 75 y.o. male with medical history significant for atrial fibrillation, history of DVT, on Eliquis, hyperlipidemia, hypothyroid, BPH, pancytopenia, MGUS, chronic right upper back pain, presents to the ED for chief concerns of fall. Pt reports that PTA, his legs became so weak and wobbly.  He endorses that he fell in the a.m. on day of admission.  He denies light head trauma and loss of consciousness.  He denies seizure activity. He reports he fell in the hospital. In the ED, vita signs stable, labs showed hemoglobin of 7.7 lower than patient's baseline of around 8, labs fairly stable.  COVID/influenza were negative.  Patient admitted for further management.    Today, patient still reports back pain, unchanged back pain.  Denies any chest pain, abdominal pain, nausea/vomiting, fever/chills.    Assessment/Plan: Principal Problem:   Symptomatic anemia Active Problems:   Acquired hypothyroidism   Benign prostatic hyperplasia with urinary obstruction   Compression fracture of lumbar vertebra (HCC)   Crohn's disease of large intestine (HCC)   Deep vein thrombosis (DVT) (HCC)   Unspecified atrial fibrillation (HCC)   CVA (cerebral vascular accident) ([place])   Symptomatic anemia likely multifactorial including R groin hematoma present on admission Hx of MGUS Likely 2/2 recent left heart cath on 10/23/2020 Hgb 7.7 on admission, baseline around 8.1-8.4 Ultrasound ordered by EDP showed right groin hematoma measuring 7.2 cm and no evidence of pseudoaneurysm or AV fistula in the right groin S/p 2 unit of PRBC given on 10/26/20 Discussed with Dr Humphrey Rolls cardiologist about hematoma, rec CT pelvis to r/o retroperitoneal hematoma CT abd/pelvis negative for retroperitoneal hemorrhage Discussed with Dr Humphrey Rolls, restart Eliquis at a lower dose, 2.5 mg twice daily  and follow-up in 2 weeks Needs to follow-up with his hematologist at [facility] upon discharge Daily CBC  Possible pyelonephritis (R flank pain) UTI Last temp spike 100.9 on 10/27/2020, with neutropenia UC showed 6000 Klebsiella pneumonia, 7000 Enterococcus faecalis CT abdomen pelvis showed stable mucosal edema involving the right renal collecting system compatible with infection Given neutropenic status, continue Rocephin X 5 days Monitor fever curve Neutropenic precautions Needs to follow-up with his urologist at [facility] upon discharge  Acute small ischemic cortical infarct Likely incidental, no focal neurologic deficits Noted to have bilateral lower extremity weakness, falls, syncope MRI brain showed above Echo 10/21/20 with EF of 55-60% MRA H&N unremarkable Lipid panel with LDL 39, HDL 17 Neurology consulted, appreciate recs Restart Eliquis as above PT/OT Telemetry  P. Atrial fibrillation Restart Eliquis at a lower dose due to acute anemia and hematoma   BLE numbness, chronic MRI of the lumbar unremarkable Pain management   History of chronic leukopenia and thrombocytopenia Currently neutropenic, precautions ordered Hematologist Dr. Grayland Ormond consulted, appreciate recs Follow-up with his rheumatologist at [facility] upon discharge  History of DVT Restart eliquis  Hypothyroidism Resumed levothyroxine  Depression/anxiety/insomnia Sertraline, trazodone         Estimated body mass index is 27.98 kg/m as calculated from the following:   Height as of this encounter: 5\' 8"  (1.727 m).   Weight as of this encounter: 83.5 kg.     Code Status: full  Family Communication: Discussed with wife  Disposition Plan: Status is: Inpatient  The patient will require care spanning > 2 midnights and should be moved to inpatient because: Inpatient level of care appropriate due to severity of illness  Dispo:  The patient is from: Home              Anticipated d/c is to: SNF               Patient currently is not medically stable to d/c.   Difficult to place patient No     Consultants: Neurology  Hematology Cardiology  Procedures: None  Antimicrobials: Rocephin  DVT prophylaxis: Eliquis   Objective: Vitals:   10/30/20 2031 10/31/20 0506 10/31/20 0819 10/31/20 1520  BP: 120/77 (!) 113/57 111/74 (!) 121/55  Pulse: 79 95 97 69  Resp: 18 16 16 14   Temp: 98.1 F (36.7 C) 97.8 F (36.6 C) 98.1 F (36.7 C) 97.8 F (36.6 C)  TempSrc: Oral Oral Oral Oral  SpO2: 98% 97% 97% 99%  Weight:      Height:        Intake/Output Summary (Last 24 hours) at 10/31/2020 1708 Last data filed at 10/31/2020 0556 Gross per 24 hour  Intake --  Output 1400 ml  Net -1400 ml   Filed Weights   10/26/20 0600  Weight: 83.5 kg    Exam: General: NAD, deconditioned Cardiovascular: S1, S2 present Respiratory: CTAB Abdomen: Soft, nontender, nondistended, bowel sounds present, right groin hematoma noted, tender to palpate Musculoskeletal: No bilateral pedal edema noted Skin: Normal Psychiatry: Normal mood  Neurology: BUE 5/5 strength, BLE 4/5, sensation intact   Data Reviewed: CBC: Recent Labs  Lab 10/26/20 0601 10/26/20 1558 10/27/20 0635 10/27/20 1527 10/28/20 0606 10/28/20 1707 10/29/20 0618 10/30/20 0700 10/31/20 0507  WBC 1.6*   < > 2.3*  --  1.2*  --  0.6* 0.5* 0.5*  NEUTROABS 0.8*  --   --   --  0.4*  --  0.3* 0.2* 0.2*  HGB 7.7*   < > 7.9*   < > 7.6* 8.0* 7.5* 7.7* 8.4*  HCT 22.7*   < > 22.7*   < > 22.5* 23.2* 21.9* 22.9* 25.0*  MCV 111.3*   < > 103.7*  --  107.1*  --  104.8* 106.0* 106.8*  PLT 198   < > 153  --  158  --  141* 148* 162   < > = values in this interval not displayed.   Basic Metabolic Panel: Recent Labs  Lab 10/26/20 0601 10/27/20 0635 10/28/20 0606 10/29/20 0618 10/30/20 0700  NA 137 135 136 140 139  K 3.1* 3.6 3.6 3.6 3.8  CL 109 107 109 113* 113*  CO2 22 21* 22 21* 23  GLUCOSE 110* 102* 101* 112* 97  BUN 13 20 21 14 11    CREATININE 1.06 1.21 1.02 0.92 0.76  CALCIUM 7.9* 7.7* 7.8* 7.6* 7.7*  MG 1.7  --   --   --   --    GFR: Estimated Creatinine Clearance: 84 mL/min (by C-G formula based on SCr of 0.76 mg/dL). Liver Function Tests: Recent Labs  Lab 10/26/20 0601  AST 28  ALT 19  ALKPHOS 73  BILITOT 1.3*  PROT 5.9*  ALBUMIN 2.6*   No results for input(s): LIPASE, AMYLASE in the last 168 hours.  No results for input(s): AMMONIA in the last 168 hours. Coagulation Profile: Recent Labs  Lab 10/26/20 0601  INR 1.5*   Cardiac Enzymes: No results for input(s): CKTOTAL, CKMB, CKMBINDEX, TROPONINI in the last 168 hours. BNP (last 3 results) No results for input(s): PROBNP in the last 8760 hours. HbA1C: No results for input(s): HGBA1C in the last 72 hours.  CBG: Recent Labs  Lab 10/27/20 2129  GLUCAP 142*   Lipid Profile: No results for input(s): CHOL, HDL, LDLCALC, TRIG, CHOLHDL, LDLDIRECT in the last 72 hours.  Thyroid Function Tests: No results for input(s): TSH, T4TOTAL, FREET4, T3FREE, THYROIDAB in the last 72 hours. Anemia Panel: No results for input(s): VITAMINB12, FOLATE, FERRITIN, TIBC, IRON, RETICCTPCT in the last 72 hours. Urine analysis:    Component Value Date/Time   COLORURINE AMBER (A) 10/26/2020 0811   APPEARANCEUR CLOUDY (A) 10/26/2020 0811   LABSPEC 1.017 10/26/2020 0811   PHURINE 5.0 10/26/2020 0811   GLUCOSEU NEGATIVE 10/26/2020 0811   HGBUR LARGE (A) 10/26/2020 0811   BILIRUBINUR NEGATIVE 10/26/2020 0811   KETONESUR NEGATIVE 10/26/2020 0811   PROTEINUR 100 (A) 10/26/2020 0811   NITRITE NEGATIVE 10/26/2020 0811   LEUKOCYTESUR NEGATIVE 10/26/2020 0811   Sepsis Labs: @LABRCNTIP (procalcitonin:4,lacticidven:4)  ) Recent Results (from the past 240 hour(s))  Resp Panel by RT-PCR (Flu A&B, Covid) Nasopharyngeal Swab     Status: None   Collection Time: 10/26/20  6:01 AM   Specimen: Nasopharyngeal Swab; Nasopharyngeal(NP) swabs in vial transport medium  Result  Value Ref Range Status   SARS Coronavirus 2 by RT PCR NEGATIVE NEGATIVE Final    Comment: (NOTE) SARS-CoV-2 target nucleic acids are NOT DETECTED.  The SARS-CoV-2 RNA is generally detectable in upper respiratory specimens during the acute phase of infection. The lowest concentration of SARS-CoV-2 viral copies this assay can detect is 138 copies/mL. A negative result does not preclude SARS-Cov-2 infection and should not be used as the sole basis for treatment or other patient management decisions. A negative result may occur with  improper specimen collection/handling, submission of specimen other than nasopharyngeal swab, presence of viral mutation(s) within the areas targeted by this assay, and inadequate number of viral copies(<138 copies/mL). A negative result must be combined with clinical observations, patient history, and epidemiological information. The expected result is Negative.  Fact Sheet for Patients:  EntrepreneurPulse.com.au  Fact Sheet for Healthcare Providers:  IncredibleEmployment.be  This test is no t yet approved or cleared by the [place] FDA and  has been authorized for detection and/or diagnosis of SARS-CoV-2 by FDA under an Emergency Use Authorization (EUA). This EUA will remain  in effect (meaning this test can be used) for the duration of the COVID-19 declaration under Section 564(b)(1) of the Act, 21 U.S.C.section 360bbb-3(b)(1), unless the authorization is terminated  or revoked sooner.       Influenza A by PCR NEGATIVE NEGATIVE Final   Influenza B by PCR NEGATIVE NEGATIVE Final    Comment: (NOTE) The Xpert Xpress SARS-CoV-2/FLU/RSV plus assay is intended as an aid in the diagnosis of influenza from Nasopharyngeal swab specimens and should not be used as a sole basis for treatment. Nasal washings and aspirates are unacceptable for Xpert Xpress SARS-CoV-2/FLU/RSV testing.  Fact Sheet for  Patients: EntrepreneurPulse.com.au  Fact Sheet for Healthcare Providers: IncredibleEmployment.be  This test is not yet approved or cleared by the [place] FDA and has been authorized for detection and/or diagnosis of SARS-CoV-2 by FDA under an Emergency Use Authorization (EUA). This EUA will remain in effect (meaning this test can be used) for the duration of the COVID-19 declaration under Section 564(b)(1) of the Act, 21 U.S.C. section 360bbb-3(b)(1), unless the authorization is terminated or revoked.  Performed at [facility], [address], [place], [place] [address]   Blood culture (routine single)     Status: None   Collection Time: 10/26/20  8:11 AM   Specimen:  BLOOD  Result Value Ref Range Status   Specimen Description BLOOD RIGHT [facility]  Final   Special Requests   Final    BOTTLES DRAWN AEROBIC AND ANAEROBIC Blood Culture adequate volume   Culture   Final    NO GROWTH 5 DAYS Performed at , [address] [place], [place] [address]    Report Status 10/31/2020 FINAL  Final  Urine culture     Status: Abnormal   Collection Time: 10/26/20  8:11 AM   Specimen: In/Out Cath Urine  Result Value Ref Range Status   Specimen Description   Final    IN/OUT CATH URINE Performed at [facility], [address] [place], [place] [address]    Special Requests   Final    NONE Performed at [facility], [address], [place], [place] [address]    Culture (A)  Final    6,000 COLONIES/mL KLEBSIELLA PNEUMONIAE 7,000 COLONIES/mL ENTEROCOCCUS FAECALIS    Report Status 10/28/2020 FINAL  Final   Organism ID, Bacteria KLEBSIELLA PNEUMONIAE (A)  Final   Organism ID, Bacteria ENTEROCOCCUS FAECALIS (A)  Final      Susceptibility   Enterococcus faecalis - MIC*    AMPICILLIN <=2 SENSITIVE Sensitive     NITROFURANTOIN <=16 SENSITIVE Sensitive     VANCOMYCIN 1 SENSITIVE Sensitive     * 7,000  COLONIES/mL ENTEROCOCCUS FAECALIS   Klebsiella pneumoniae - MIC*    AMPICILLIN >=32 RESISTANT Resistant     CEFAZOLIN <=4 SENSITIVE Sensitive     CEFEPIME <=0.12 SENSITIVE Sensitive     CEFTRIAXONE <=0.25 SENSITIVE Sensitive     CIPROFLOXACIN <=0.25 SENSITIVE Sensitive     GENTAMICIN <=1 SENSITIVE Sensitive     IMIPENEM <=0.25 SENSITIVE Sensitive     NITROFURANTOIN 64 INTERMEDIATE Intermediate     TRIMETH/SULFA >=320 RESISTANT Resistant     AMPICILLIN/SULBACTAM 4 SENSITIVE Sensitive     PIP/TAZO <=4 SENSITIVE Sensitive     * 6,000 COLONIES/mL KLEBSIELLA PNEUMONIAE      Studies: No results found.  Scheduled Meds:  atorvastatin  40 mg Oral QHS   finasteride  5 mg Oral Daily   gabapentin  100 mg Oral TID   levothyroxine  100 mcg Oral Q0600   pantoprazole  40 mg Oral Daily   pilocarpine  5 mg Oral TID   pyridoxine  100 mg Oral Daily   sertraline  100 mg Oral Daily   timolol  1 drop Right Eye BID   traZODone  100 mg Oral QHS    Continuous Infusions:  cefTRIAXone (ROCEPHIN)  IV Stopped (10/31/20 0901)     LOS: 3 days     Alma Friendly, MD [facility]  If 7PM-7AM, please contact night-coverage [email or web address] 10/31/2020, 5:08 PM

## 2020-11-01 LAB — PATHOLOGIST SMEAR REVIEW

## 2020-11-01 LAB — BASIC METABOLIC PANEL
Anion gap: 8 (ref 5–15)
BUN: 9 mg/dL (ref 8–23)
CO2: 24 mmol/L (ref 22–32)
Calcium: 8 mg/dL — ABNORMAL LOW (ref 8.9–10.3)
Chloride: 110 mmol/L (ref 98–111)
Creatinine, Ser: 0.87 mg/dL (ref 0.61–1.24)
GFR, Estimated: >60 mL/min (ref >60)
Glucose, Bld: 95 mg/dL (ref 70–99)
Potassium: 4 mmol/L (ref 3.5–5.1)
Sodium: 142 mmol/L (ref 135–145)

## 2020-11-01 MED ORDER — LATANOPROST 0.005 % OP SOLN
1.0000 [drp] | Freq: Every day | OPHTHALMIC | Status: DC
  Administered 2020-11-01 – 2020-11-03 (×3): 1 [drp] via OPHTHALMIC
  Filled 2020-11-01: qty 2.5

## 2020-11-01 NOTE — Progress Notes (Signed)
PROGRESS NOTE  Gordon Farrell  DOB: 1945-07-17  PCP: Danae Orleans, MD NID:[id number]  DOA: 10/26/2020  LOS: 4 days  Hospital Day: 7   Chief Complaint  Patient presents with   Fall    Brief narrative: Gordon Farrell is a 75 y.o. male with PMH significant for atrial fibrillation, history of DVT, on Eliquis, hyperlipidemia, hypothyroid, BPH, pancytopenia, MGUS, chronic right upper back pain. Patient presented to the ED on 6/23 with complaints of progressive generalized weakness, gait instability and fall   In the ED, he was noted to have stable vitals, low baseline hemoglobin  Admitted for further evaluation management Seen by PT.  We have recommended.  Pending insurance authorization  Subjective: Patient was seen and examined this morning. Pleasant, elderly Caucasian male.  Not in distress.  Assessment/Plan: Progressive generalized weakness -Multifactorial: MGUS, anemia, UTI, stroke, old-age -PT/OT eval obtained.  Rehab recommended.  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  Acute drop in hemoglobin this admission probably because of right groin hematoma related to cardiac cath. -2 units of PRBC transfusion 6/23 -Hemoglobin overall stable at this time. Recent Labs    10/21/20 1706 10/22/20 0140 10/28/20 1707 10/29/20 0618 10/30/20 0700 10/31/20 0507 11/01/20 0522  HGB  --    < > 8.0* 7.5* 7.7* 8.4* 8.3*  MCV  --    < >  --  104.8* 106.0* 106.8* 105.9*  VITAMINB12 1,280*  --   --   --   --   --   --   TIBC 197*  --   --   --   --   --   --   IRON 41*  --   --   --   --   --   --    < > = values in this interval not displayed.   Right groin hematoma-POA -6/20, patient had cardiac cath. -6/23, on presentation, patient was noted to have right groin hematoma of size 7.2 cm.  Cardiology was consulted. -CT abdomen pelvis was obtained to rule out retroperitoneal hematoma. -Per cardiology  recommendation, Eliquis has been resumed at a lower dose of 2.5 mg twice daily. -To follow-up with cardiology as an outpatient.  UTI, right pyelonephritis -6/24, CT abdomen pelvis showed stable mucosal edema in the right renal collecting system compatible with infection.   -Nonsignificant growth of Enterococcus faecalis and Klebsiella in urine. -IV Rocephin for 5 days   Acute small ischemic cortical infarct -MRI brain showed punctate 3 mm acute ischemic nonhemorrhagic cortical infarct involving the posterior left temporal occipital region.  Likely incidental, no focal neurologic deficits -Neurology consulted.  Eliquis resumed. -PT/OT eval obtained.   Paroxysmal atrial fibrillation -On Eliquis.     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     Diet Order             Diet Heart Room service appropriate? Yes; Fluid consistency: Thin  Diet effective now                   DVT prophylaxis:  apixaban (ELIQUIS) tablet 2.5 mg  Start: 10/31/20 2200 Place TED hose Start: 10/26/20 1211 apixaban (ELIQUIS) tablet 2.5 mg   Antimicrobials: Completed 5 days of IV Rocephin Fluid: None Consultants: Cardiology Family Communication: Called and updated patient's wife Mrs. Braggs this afternoon.  Status is: Inpatient  Remains inpatient appropriate because: Pending insurance authorizing for rehab  Dispo: The patient is from: Home              Anticipated d/c is to: Rehab if authorized by insurance              Patient currently is medically  stable to d/c.   Difficult to place patient Yes     Infusions:   cefTRIAXone (ROCEPHIN)  IV Stopped (10/31/20 0901)    Scheduled Meds:  apixaban  2.5 mg Oral BID   atorvastatin  40 mg Oral QHS   finasteride  5 mg Oral Daily   gabapentin  100 mg Oral TID   levothyroxine  100 mcg Oral Q0600   pantoprazole  40 mg Oral Daily   pilocarpine  5 mg Oral TID   pyridoxine  100 mg Oral Daily   sertraline  100 mg Oral Daily   timolol  1 drop Right Eye BID   traZODone  100 mg Oral QHS    Antimicrobials: Anti-infectives (From admission, onward)    Start     Dose/Rate Route Frequency Ordered Stop   10/28/20 1000  cefTRIAXone (ROCEPHIN) 1 g in sodium chloride 0.9 % 100 mL IVPB        1 g 200 mL/hr over 30 Minutes Intravenous Every 24 hours 10/28/20 0823 11/02/20 0959       PRN meds: acetaminophen **OR** acetaminophen, antiseptic oral rinse, HYDROcodone-acetaminophen, lip balm, ondansetron **OR** ondansetron (ZOFRAN) IV   Objective: Vitals:   11/01/20 0418 11/01/20 0714  BP: (!) 109/47 (!) 122/56  Pulse: 68 87  Resp: 18 20  Temp: (!) 97.4 F (36.3 C) 98.2 F (36.8 C)  SpO2: 97% 100%    Intake/Output Summary (Last 24 hours) at 11/01/2020 1157 Last data filed at 11/01/2020 1023 Gross per 24 hour  Intake --  Output 1801 ml  Net -1801 ml   Filed Weights   10/26/20 0600  Weight: 83.5 kg   Weight change:  Body mass index is 27.98 kg/m.   Physical Exam: General exam: Pleasant, elderly Caucasian male.  Not in distress Skin: No rashes, lesions or ulcers. HEENT: Atraumatic, normocephalic, no obvious bleeding Lungs: Clear to auscultation bilaterally CVS: Regular rate and rhythm, no murmur GI/Abd soft, nontender, nondistended, bowel sound present CNS: Alert, awake, oriented x3 Psychiatry: Mood appropriate Extremities: No edema, no calf tenderness  Data Review: I have personally reviewed the laboratory data and studies available.  Recent Labs  Lab 10/28/20 0606  10/28/20 1707 10/29/20 0618 10/30/20 0700 10/31/20 0507 11/01/20 0522  WBC 1.2*  --  0.6* 0.5* 0.5* 0.6*  NEUTROABS 0.4*  --  0.3* 0.2* 0.2* 0.2*  HGB 7.6* 8.0* 7.5* 7.7* 8.4* 8.3*  HCT 22.5* 23.2* 21.9* 22.9* 25.0* 25.1*  MCV 107.1*  --  104.8* 106.0* 106.8* 105.9*  PLT 158  --  141* 148* 162 146*   Recent Labs  Lab 10/26/20 0601 10/27/20 0635 10/28/20 0606 10/29/20 0618 10/30/20 0700 11/01/20 0522  NA 137 135 136 140 139 142  K 3.1* 3.6 3.6 3.6 3.8 4.0  CL 109 107 109 113* 113* 110  CO2 22 21* 22 21* 23 24  GLUCOSE 110* 102* 101* 112* 97 95  BUN 13 20  21 14 11 9   CREATININE 1.06 1.21 1.02 0.92 0.76 0.87  CALCIUM 7.9* 7.7* 7.8* 7.6* 7.7* 8.0*  MG 1.7  --   --   --   --   --     F/u labs ordered Unresulted Labs (From admission, onward)     Start     Ordered   10/28/20 0500  CBC with Differential/Platelet  Daily,   R      10/27/20 1512            Signed, Terrilee Croak, MD [facility] 11/01/2020

## 2020-11-01 NOTE — TOC Progression Note (Addendum)
Transition of Care [facility]) - Progression Note    Patient Details  Name: [name] MRN: [id number] Date of Birth: [birth year]  Transition of Care [facility]) CM/SW [place], RN Phone Number: 11/01/2020, 10:02 AM  Clinical Narrative:   Received a call from [facility], confirming that Authorization was denied due to no documentation to support a skilled need. The current clinicals were not different from prior admission function. Representative  recommends Home Health services, ALF or LTC. Discussed with wife, who wants [place] services and for patient to have ride home. I did inform her that I will set up transportation and [facility] services if needed.  Several failed attempts made to [facility]. Wife aware states she did not want husband to go to [facility], discussed [facility] in [place] and [facility] in [place], wife now wants [facility] and [facility] [facility], sent referrals electronically will discuss offers tomorrow.    Expected Discharge Plan: [place] Barriers to Discharge: Continued Medical Work up  Expected Discharge Plan and Services Expected Discharge Plan: [place] Choice: [place] arrangements for the past 2 months: Single Family Home                                       Social Determinants of Health (SDOH) Interventions    Readmission Risk Interventions No flowsheet data found.

## 2020-11-01 NOTE — Progress Notes (Signed)
SUBJECTIVE: Patient doing better today. Reports back pan has improved. Denies chest pain and shortness of breath.   Vitals:   10/31/20 1923 10/31/20 2330 11/01/20 0418 11/01/20 0714  BP: 119/65 104/63 (!) 109/47 (!) 122/56  Pulse: 82 92 68 87  Resp: 16 15 18 20   Temp: 98 F (36.7 C) 97.6 F (36.4 C) (!) 97.4 F (36.3 C) 98.2 F (36.8 C)  TempSrc: Oral Oral Oral Oral  SpO2: 98% 97% 97% 100%  Weight:      Height:        Intake/Output Summary (Last 24 hours) at 11/01/2020 0945 Last data filed at 11/01/2020 0646 Gross per 24 hour  Intake --  Output 1800 ml  Net -1800 ml    LABS: Basic Metabolic Panel: Recent Labs    10/30/20 0700 11/01/20 0522  NA 139 142  K 3.8 4.0  CL 113* 110  CO2 23 24  GLUCOSE 97 95  BUN 11 9  CREATININE 0.76 0.87  CALCIUM 7.7* 8.0*   Liver Function Tests: No results for input(s): AST, ALT, ALKPHOS, BILITOT, PROT, ALBUMIN in the last 72 hours. No results for input(s): LIPASE, AMYLASE in the last 72 hours. CBC: Recent Labs    10/31/20 0507 11/01/20 0522  WBC 0.5* 0.6*  NEUTROABS 0.2* 0.2*  HGB 8.4* 8.3*  HCT 25.0* 25.1*  MCV 106.8* 105.9*  PLT 162 146*   Cardiac Enzymes: No results for input(s): CKTOTAL, CKMB, CKMBINDEX, TROPONINI in the last 72 hours. BNP: Invalid input(s): POCBNP D-Dimer: No results for input(s): DDIMER in the last 72 hours. Hemoglobin A1C: No results for input(s): HGBA1C in the last 72 hours. Fasting Lipid Panel: No results for input(s): CHOL, HDL, LDLCALC, TRIG, CHOLHDL, LDLDIRECT in the last 72 hours. Thyroid Function Tests: No results for input(s): TSH, T4TOTAL, T3FREE, THYROIDAB in the last 72 hours.  Invalid input(s): FREET3 Anemia Panel: No results for input(s): VITAMINB12, FOLATE, FERRITIN, TIBC, IRON, RETICCTPCT in the last 72 hours.   PHYSICAL EXAM General: Well developed, well nourished, in no acute distress HEENT:  Normocephalic and atramatic Neck:  No JVD.  Lungs: Clear bilaterally to  auscultation and percussion. Heart: HRRR . Normal S1 and S2 without gallops or murmurs.  Abdomen: Bowel sounds are positive, abdomen soft and non-tender  Msk:  Back normal, normal gait. Normal strength and tone for age. Extremities: No clubbing, cyanosis or edema.   Neuro: Alert and oriented X 3. Psych:  Good affect, responds appropriately  TELEMETRY: atrial fibrillation  ASSESSMENT AND PLAN: Patient denies chest pain and shortness of breath. Patient to continue to hold Eliquis for now. We will consider starting Eliquis on follow up in the office if hemoglobin is stable on discharge from the hospital. Continue to monitor for now.  Principal Problem:   Symptomatic anemia Active Problems:   Acquired hypothyroidism   Benign prostatic hyperplasia with urinary obstruction   Compression fracture of lumbar vertebra (HCC)   Crohn's disease of large intestine (HCC)   Deep vein thrombosis (DVT) (HCC)   Unspecified atrial fibrillation (HCC)   CVA (cerebral vascular accident) ([place])   Amber Scoggins, FNP-C  Dionisio David, MD, [facility] 11/01/2020 9:45 AM

## 2020-11-02 LAB — CBC WITH DIFFERENTIAL/PLATELET
Abs Immature Granulocytes: 0 10*3/uL (ref 0.00–0.07)
Abs Immature Granulocytes: 0 10*3/uL (ref 0.00–0.07)
Basophils Absolute: 0 10*3/uL (ref 0.0–0.1)
Basophils Absolute: 0 10*3/uL (ref 0.0–0.1)
Basophils Relative: 0 %
Basophils Relative: 0 %
Eosinophils Absolute: 0 10*3/uL (ref 0.0–0.5)
Eosinophils Absolute: 0 10*3/uL (ref 0.0–0.5)
Eosinophils Relative: 0 %
Eosinophils Relative: 0 %
HCT: 25.1 % — ABNORMAL LOW (ref 39.0–52.0)
HCT: 23.9 % — ABNORMAL LOW (ref 39.0–52.0)
Hemoglobin: 8.3 g/dL — ABNORMAL LOW (ref 13.0–17.0)
Hemoglobin: 8 g/dL — ABNORMAL LOW (ref 13.0–17.0)
Immature Granulocytes: 0 %
Immature Granulocytes: 0 %
Lymphocytes Relative: 54 %
Lymphocytes Relative: 66 %
Lymphs Abs: 0.3 10*3/uL — ABNORMAL LOW (ref 0.7–4.0)
Lymphs Abs: 0.4 10*3/uL — ABNORMAL LOW (ref 0.7–4.0)
MCH: 35 pg — ABNORMAL HIGH (ref 26.0–34.0)
MCH: 35.2 pg — ABNORMAL HIGH (ref 26.0–34.0)
MCHC: 33.1 g/dL (ref 30.0–36.0)
MCHC: 33.5 g/dL (ref 30.0–36.0)
MCV: 105.9 fL — ABNORMAL HIGH (ref 80.0–100.0)
MCV: 105.3 fL — ABNORMAL HIGH (ref 80.0–100.0)
Monocytes Absolute: 0 10*3/uL — ABNORMAL LOW (ref 0.1–1.0)
Monocytes Absolute: 0 10*3/uL — ABNORMAL LOW (ref 0.1–1.0)
Monocytes Relative: 7 %
Monocytes Relative: 7 %
Neutro Abs: 0.2 10*3/uL — CL (ref 1.7–7.7)
Neutro Abs: 0.2 10*3/uL — CL (ref 1.7–7.7)
Neutrophils Relative %: 39 %
Neutrophils Relative %: 27 %
Platelets: 146 10*3/uL — ABNORMAL LOW (ref 150–400)
Platelets: 134 10*3/uL — ABNORMAL LOW (ref 150–400)
RBC: 2.37 MIL/uL — ABNORMAL LOW (ref 4.22–5.81)
RBC: 2.27 MIL/uL — ABNORMAL LOW (ref 4.22–5.81)
RDW: 19.9 % — ABNORMAL HIGH (ref 11.5–15.5)
RDW: 19.8 % — ABNORMAL HIGH (ref 11.5–15.5)
Smear Review: NORMAL
Smear Review: NORMAL
WBC: 0.6 10*3/uL — CL (ref 4.0–10.5)
WBC: 0.6 10*3/uL — CL (ref 4.0–10.5)
nRBC: 0 % (ref 0.0–0.2)
nRBC: 0 % (ref 0.0–0.2)

## 2020-11-02 NOTE — Plan of Care (Signed)

## 2020-11-02 NOTE — Progress Notes (Signed)
Physical Therapy Treatment Patient Details Name: Gordon Farrell MRN: [id number] DOB: Jun 14, 1945 Today's Date: 11/02/2020    History of Present Illness Pt is a 75 y/o M admitted on 10/26/20 after a fall. Pt being treated for symptomatic anemia, suspect 2/2 groin hematoma present on admission, suspect 2/2 to recent heart cath on 10/23/20. 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 PMH: a-fib, DVT, on eliquis, HLD, hypothyroid, BPH, pancytopenia, MGUS, chronic R upper back pain.    PT Comments    Pt resting in bed upon PT arrival; agreeable to PT session.  Able to progress to ambulating 120 feet with RW CGA.  Also performed sitting and semi-supine LE ex's to improve overall LE strength, endurance, and activity tolerance.  Generalized weakness and SOB with activity noted.  Pt requiring some pacing during session d/t fatigue.  Will continue to focus on strengthening, activity tolerance, and increasing ambulation distance during hospitalization.   Follow Up Recommendations  SNF     Equipment Recommendations  Rolling walker with 5" wheels;3in1 (PT)    Recommendations for Other Services       Precautions / Restrictions Precautions Precautions: Fall Restrictions Weight Bearing Restrictions: No    Mobility  Bed Mobility Overal bed mobility: Needs Assistance Bed Mobility: Supine to Sit;Sit to Supine     Supine to sit: Supervision;HOB elevated Sit to supine: Supervision;HOB elevated   General bed mobility comments: mild increased time to perform on own    Transfers Overall transfer level: Needs assistance Equipment used: Rolling walker (2 wheeled) Transfers: Sit to/from Stand Sit to Stand: Min guard Stand pivot transfers: Min guard       General transfer comment: increased effort/time to stand up to walker  Ambulation/Gait Ambulation/Gait assistance: Min guard Gait Distance (Feet):  120 Feet Assistive device: Rolling walker (2 wheeled)   Gait velocity: decreased   General Gait Details: partial step through gait pattern; steady with RW use   Stairs             Wheelchair Mobility    Modified Rankin (Stroke Patients Only)       Balance Overall balance assessment: Needs assistance Sitting-balance support: No upper extremity supported;Feet supported Sitting balance-Leahy Scale: Good Sitting balance - Comments: steady sitting reaching within BOS   Standing balance support: Single extremity supported Standing balance-Leahy Scale: Fair Standing balance comment: pt requiring at least single UE support for static standing balance                            Cognition Arousal/Alertness: Awake/alert Behavior During Therapy: WFL for tasks assessed/performed Overall Cognitive Status: Within Functional Limits for tasks assessed                                 General Comments: A&O x 4      Exercises General Exercises - Lower Extremity Quad Sets: AROM;Strengthening;Both;10 reps;Supine Short Arc Quad: AROM;Strengthening;Both;10 reps;Supine Long Arc Quad: AROM;Strengthening;Both;10 reps;Seated Heel Slides: AROM;Strengthening;Both;10 reps;Supine Hip ABduction/ADduction: AROM;Strengthening;Both;10 reps;Supine Hip Flexion/Marching: AROM;Strengthening;Both;10 reps;Seated    General Comments        Pertinent Vitals/Pain Pain Assessment: Faces Pain Score: 3  Faces Pain Scale: Hurts a little bit Pain Location: R upper back pain Pain Descriptors / Indicators: Discomfort Pain Intervention(s): Limited activity within patient's tolerance;Monitored during session;Repositioned Vitals (HR and O2 on room air) stable  and WFL throughout treatment session.    Home Living                      Prior Function            PT Goals (current goals can now be found in the care plan section) Acute Rehab PT Goals Patient Stated Goal:  decrease pain PT Goal Formulation: With patient Time For Goal Achievement: 11/10/20 Potential to Achieve Goals: Fair Progress towards PT goals: Progressing toward goals    Frequency    Min 2X/week      PT Plan Current plan remains appropriate    Co-evaluation              AM-PAC PT "6 Clicks" Mobility   Outcome Measure  Help needed turning from your back to your side while in a flat bed without using bedrails?: A Little Help needed moving from lying on your back to sitting on the side of a flat bed without using bedrails?: A Little Help needed moving to and from a bed to a chair (including a wheelchair)?: A Little Help needed standing up from a chair using your arms (e.g., wheelchair or bedside chair)?: A Little Help needed to walk in hospital room?: A Little Help needed climbing 3-5 steps with a railing? : A Little 6 Click Score: 18    End of Session Equipment Utilized During Treatment: Gait belt Activity Tolerance: Patient limited by fatigue Patient left: in bed;with call bell/phone within reach;with bed alarm set Nurse Communication: Mobility status;Precautions;Other (comment) (scant dried blood noted R groin (firm hematoma noted)) PT Visit Diagnosis: Difficulty in walking, not elsewhere classified (R26.2);Muscle weakness (generalized) (M62.81);Pain Pain - Right/Left: Right Pain - part of body:  (upper back)     Time: 4854-6270 PT Time Calculation (min) (ACUTE ONLY): 29 min  Charges:  $Gait Training: 8-22 mins $Therapeutic Exercise: 8-22 mins                     Leitha Bleak, PT 11/02/20, 5:31 PM

## 2020-11-02 NOTE — Progress Notes (Signed)
Occupational Therapy Treatment Patient Details Name: Gordon Farrell MRN: [id number] DOB: 1945-06-16 Today's Date: 11/02/2020    History of present illness Pt is a 75 y/o M admitted on 10/26/20 after a fall. Pt being treated for symptomatic anemia, suspect 2/2 groin hematoma present on admission, suspect 2/2 to recent heart cath on 10/23/20. 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 PMH: a-fib, DVT, on eliquis, HLD, hypothyroid, BPH, pancytopenia, MGUS, chronic R upper back pain.   OT comments  Upon entering the room, pt supine in bed with 3/10 c/o back pain. Pt agreeable to OT intervention this session. Pt performing bed mobility with supervision. PT standing with min A from bed and utilized RW to sink to stand and brush teeth with supervision overall. Pt performed side steps with RW and ambulates 100' with min guard with pt needing min cuing for forward gaze. Pt returning back to bed at end of session with supervision. Pt with several questions regarding discharge and OT discussed current recommendations from therapy.Pt continues to benefit from OT intervention.    Follow Up Recommendations  SNF    Equipment Recommendations  Other (comment) (defer to next venue of care)       Precautions / Restrictions Precautions Precautions: Fall Restrictions Weight Bearing Restrictions: No       Mobility Bed Mobility Overal bed mobility: Needs Assistance Bed Mobility: Supine to Sit;Sit to Supine     Supine to sit: Supervision;HOB elevated Sit to supine: Supervision;HOB elevated   General bed mobility comments: increased time to perform on own    Transfers Overall transfer level: Needs assistance Equipment used: Rolling walker (2 wheeled) Transfers: Sit to/from [facility] Sit to Stand: Min assist Stand pivot transfers: Min guard       General transfer comment: min A to stand  from EOB    Balance Overall balance assessment: Needs assistance Sitting-balance support: No upper extremity supported;Feet supported Sitting balance-Leahy Scale: Good Sitting balance - Comments: steady sitting reaching within BOS   Standing balance support: Single extremity supported Standing balance-Leahy Scale: Fair Standing balance comment: UE support                           ADL either performed or assessed with clinical judgement   ADL Overall ADL's : Needs assistance/impaired     Grooming: Wash/dry hands;Wash/dry face;Supervision/safety;Standing;Oral care                                       Vision Wears Glasses: At all times                Cognition Arousal/Alertness: Awake/alert Behavior During Therapy: WFL for tasks assessed/performed Overall Cognitive Status: Within Functional Limits for tasks assessed                                 General Comments: A&O x 4                   Pertinent Vitals/ Pain       Pain Assessment: 0-10 Pain Score: 3  Pain Location: R upper back pain Pain Descriptors / Indicators: Aching;Discomfort;Grimacing Pain Intervention(s): Limited activity within patient's tolerance;Monitored during session;Repositioned   Frequency  Min 2X/week  Progress Toward Goals  OT Goals(current goals can now be found in the care plan section)  Progress towards OT goals: Progressing toward goals  Acute Rehab OT Goals Patient Stated Goal: decrease pain OT Goal Formulation: With patient Time For Goal Achievement: 11/10/20 Potential to Achieve Goals: Good  Plan Discharge plan remains appropriate;Frequency remains appropriate       AM-PAC OT "6 Clicks" Daily Activity     Outcome Measure   Help from another person eating meals?: None Help from another person taking care of personal grooming?: None Help from another person toileting, which includes using toliet, bedpan, or urinal?: A  Little Help from another person bathing (including washing, rinsing, drying)?: A Little Help from another person to put on and taking off regular upper body clothing?: None Help from another person to put on and taking off regular lower body clothing?: A Little 6 Click Score: 21    End of Session Equipment Utilized During Treatment: Rolling walker  OT Visit Diagnosis: Unsteadiness on feet (R26.81);History of falling (Z91.81);Other abnormalities of gait and mobility (R26.89);Muscle weakness (generalized) (M62.81);Pain Pain - part of body:  (back)   Activity Tolerance Patient tolerated treatment well   Patient Left in bed;with call bell/phone within reach;with bed alarm set   Nurse Communication Mobility status        Time: 1350-1416 OT Time Calculation (min): 26 min  Charges: OT General Charges $OT Visit: 1 Visit OT Treatments $Self Care/Home Management : 8-22 mins $Therapeutic Activity: 8-22 mins  Darleen Crocker, MS, OTR/L , CBIS ascom [phone]  11/02/20, 4:01 PM

## 2020-11-02 NOTE — Progress Notes (Signed)
SUBJECTIVE: Patient doing well today. No cardiac complaints. Patient reports back pain is better and he slept better last night. Denies chest pain and shortness of breath.    Vitals:   11/01/20 1526 11/01/20 1934 11/02/20 0441 11/02/20 0829  BP: (!) 130/53 115/68 120/63 (!) 129/55  Pulse: 71 95 70 86  Resp: 16 18 16 18   Temp: 98.7 F (37.1 C) 97.7 F (36.5 C) 97.7 F (36.5 C) 98.4 F (36.9 C)  TempSrc: Oral Oral Oral Oral  SpO2: 97% 99% 97% 97%  Weight:      Height:        Intake/Output Summary (Last 24 hours) at 11/02/2020 0847 Last data filed at 11/02/2020 0440 Gross per 24 hour  Intake --  Output 1246 ml  Net -1246 ml    LABS: Basic Metabolic Panel: Recent Labs    11/01/20 0522  NA 142  K 4.0  CL 110  CO2 24  GLUCOSE 95  BUN 9  CREATININE 0.87  CALCIUM 8.0*   Liver Function Tests: No results for input(s): AST, ALT, ALKPHOS, BILITOT, PROT, ALBUMIN in the last 72 hours. No results for input(s): LIPASE, AMYLASE in the last 72 hours. CBC: Recent Labs    11/01/20 0522 11/02/20 0441  WBC 0.6* 0.6*  NEUTROABS 0.2* 0.2*  HGB 8.3* 8.0*  HCT 25.1* 23.9*  MCV 105.9* 105.3*  PLT 146* 134*   Cardiac Enzymes: No results for input(s): CKTOTAL, CKMB, CKMBINDEX, TROPONINI in the last 72 hours. BNP: Invalid input(s): POCBNP D-Dimer: No results for input(s): DDIMER in the last 72 hours. Hemoglobin A1C: No results for input(s): HGBA1C in the last 72 hours. Fasting Lipid Panel: No results for input(s): CHOL, HDL, LDLCALC, TRIG, CHOLHDL, LDLDIRECT in the last 72 hours. Thyroid Function Tests: No results for input(s): TSH, T4TOTAL, T3FREE, THYROIDAB in the last 72 hours.  Invalid input(s): FREET3 Anemia Panel: No results for input(s): VITAMINB12, FOLATE, FERRITIN, TIBC, IRON, RETICCTPCT in the last 72 hours.   PHYSICAL EXAM General: Well developed, well nourished, in no acute distress HEENT:  Normocephalic and atramatic Neck:  No JVD.  Lungs: Clear bilaterally  to auscultation and percussion. Heart: HRRR . Normal S1 and S2 without gallops or murmurs.  Abdomen: Bowel sounds are positive, abdomen soft and non-tender  Msk:  Back normal, normal gait. Normal strength and tone for age. Extremities: No clubbing, cyanosis or edema.   Neuro: Alert and oriented X 3. Psych:  Good affect, responds appropriately  TELEMETRY: atrial fibrillation  ASSESSMENT AND PLAN:  Patient denies chest pain and shortness of breath. Patient to continue to hold Eliquis for now. We will consider starting Eliquis on follow up in the office if hemoglobin is stable on discharge from the hospital. Continue to monitor for now.    Principal Problem:   Symptomatic anemia Active Problems:   Acquired hypothyroidism   Benign prostatic hyperplasia with urinary obstruction   Compression fracture of lumbar vertebra (HCC)   Crohn's disease of large intestine (HCC)   Deep vein thrombosis (DVT) (HCC)   Unspecified atrial fibrillation (HCC)   CVA (cerebral vascular accident) ([place])   Amber Scoggins, FNP-C  Dionisio David, MD, [facility] 11/02/2020 8:47 AM

## 2020-11-02 NOTE — Progress Notes (Signed)
PROGRESS NOTE  Gordon Farrell  DOB: Sep 11, 1945  PCP: Danae Orleans, MD IHK:[id number]  DOA: 10/26/2020  LOS: 5 days  Hospital Day: 8   Chief Complaint  Patient presents with   Fall    Brief narrative: Gordon Farrell is a 75 y.o. male with PMH significant for atrial fibrillation, history of DVT, on Eliquis, hyperlipidemia, hypothyroid, BPH, pancytopenia, MGUS, chronic right upper back pain. Patient presented to the ED on 6/23 with complaints of progressive generalized weakness, gait instability and fall   In the ED, he was noted to have stable vitals, low baseline hemoglobin  Admitted for further evaluation management Seen by PT.  We have recommended.  Pending insurance authorization  Subjective: Patient was seen and examined this morning. Lying on bed.  Not in distress.  No new symptoms.  Able to ambulate inside the room with help.  Assessment/Plan: Progressive generalized weakness -Multifactorial: MGUS, anemia, UTI, stroke, old-age -PT/OT eval obtained.  Rehab recommended.  Pending insurance authorization.  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  Acute drop in hemoglobin this admission probably because of right groin hematoma related to cardiac cath. -2 units of PRBC transfusion 6/23 -Hemoglobin overall stable at this time. Recent Labs    10/21/20 1706 10/22/20 0140 10/29/20 0618 10/30/20 0700 10/31/20 0507 11/01/20 0522 11/02/20 0441  HGB  --    < > 7.5* 7.7* 8.4* 8.3* 8.0*  MCV  --    < > 104.8* 106.0* 106.8* 105.9* 105.3*  VITAMINB12 1,280*  --   --   --   --   --   --   TIBC 197*  --   --   --   --   --   --   IRON 41*  --   --   --   --   --   --    < > = values in this interval not displayed.    Right groin hematoma-POA -6/20, patient had cardiac cath. -6/23, on presentation, patient was noted to have right groin hematoma of size 7.2 cm.  Cardiology was consulted. -CT abdomen pelvis was obtained to rule out  retroperitoneal hematoma. -Eliquis has been resumed at a lower dose of 2.5 mg twice daily.  Discussed with cardiologist Dr. Humphrey Rolls on 6/30.  -To follow-up with cardiology as an outpatient.  UTI, right pyelonephritis -6/24, CT abdomen pelvis showed stable mucosal edema in the right renal collecting system compatible with infection.   -Nonsignificant growth of Enterococcus faecalis and Klebsiella in urine. -IV Rocephin for 5 days   Acute small ischemic cortical infarct -MRI brain showed punctate 3 mm acute ischemic nonhemorrhagic cortical infarct involving the posterior left temporal occipital region.  Likely incidental, no focal neurologic deficits -Neurology consulted.  Eliquis resumed. -PT/OT eval obtained.   Paroxysmal atrial fibrillation -On Eliquis.     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     Diet Order             Diet Heart Room service appropriate? Yes; Fluid consistency: Thin  Diet effective now  DVT prophylaxis:  apixaban (ELIQUIS) tablet 2.5 mg Start: 10/31/20 2200 Place TED hose Start: 10/26/20 1211 apixaban (ELIQUIS) tablet 2.5 mg   Antimicrobials: Completed 5 days of IV Rocephin Fluid: None Consultants: Cardiology Family Communication: Called and updated patient's wife Mrs. Shinault on 6/29  Status is: Inpatient  Remains inpatient appropriate because: Pending insurance authorizing for rehab  Dispo: The patient is from: Home              Anticipated d/c is to: Rehab if authorized by  insurance              Patient currently is medically stable to d/c.   Difficult to place patient Yes     Infusions:     Scheduled Meds:  apixaban  2.5 mg Oral BID   atorvastatin  40 mg Oral QHS   finasteride  5 mg Oral Daily   gabapentin  100 mg Oral TID   latanoprost  1 drop Both Eyes QHS   levothyroxine  100 mcg Oral Q0600   pantoprazole  40 mg Oral Daily   pilocarpine  5 mg Oral TID   pyridoxine  100 mg Oral Daily   sertraline  100 mg Oral Daily   timolol  1 drop Right Eye BID   traZODone  100 mg Oral QHS    Antimicrobials: Anti-infectives (From admission, onward)    Start     Dose/Rate Route Frequency Ordered Stop   10/28/20 1000  cefTRIAXone (ROCEPHIN) 1 g in sodium chloride 0.9 % 100 mL IVPB        1 g 200 mL/hr over 30 Minutes Intravenous Every 24 hours 10/28/20 0823 11/01/20 1259       PRN meds: acetaminophen **OR** acetaminophen, antiseptic oral rinse, HYDROcodone-acetaminophen, lip balm, ondansetron **OR** ondansetron (ZOFRAN) IV   Objective: Vitals:   11/02/20 0441 11/02/20 0829  BP: 120/63 (!) 129/55  Pulse: 70 86  Resp: 16 18  Temp: 97.7 F (36.5 C) 98.4 F (36.9 C)  SpO2: 97% 97%    Intake/Output Summary (Last 24 hours) at 11/02/2020 1109 Last data filed at 11/02/2020 0900 Gross per 24 hour  Intake --  Output 1445 ml  Net -1445 ml    Filed Weights   10/26/20 0600  Weight: 83.5 kg   Weight change:  Body mass index is 27.98 kg/m.   Physical Exam: General exam: Pleasant, elderly Caucasian male.  Not in distress Skin: No rashes, lesions or ulcers. HEENT: Atraumatic, normocephalic, no obvious bleeding Lungs: Clear to auscultation bilaterally CVS: Regular rate and rhythm, no murmur GI/Abd soft, nontender, nondistended, bowel sound present CNS: Alert, awake, oriented x3 Psychiatry: Mood appropriate Extremities: No edema, no calf tenderness  Data Review: I have personally reviewed the laboratory data and studies available.  Recent  Labs  Lab 10/29/20 0618 10/30/20 0700 10/31/20 0507 11/01/20 0522 11/02/20 0441  WBC 0.6* 0.5* 0.5* 0.6* 0.6*  NEUTROABS 0.3* 0.2* 0.2* 0.2* 0.2*  HGB 7.5* 7.7* 8.4* 8.3* 8.0*  HCT 21.9* 22.9* 25.0* 25.1* 23.9*  MCV 104.8* 106.0* 106.8* 105.9* 105.3*  PLT 141* 148* 162 146* 134*    Recent Labs  Lab 10/27/20 0635 10/28/20 0606 10/29/20 0618 10/30/20 0700 11/01/20 0522  NA 135 136 140 139 142  K 3.6 3.6 3.6 3.8 4.0  CL 107 109 113* 113* 110  CO2 21* 22 21* 23 24  GLUCOSE 102* 101* 112* 97 95  BUN 20 21 14 11 9   CREATININE 1.21 1.02 0.92 0.76 0.87  CALCIUM 7.7* 7.8*  7.6* 7.7* 8.0*     F/u labs ordered Unresulted Labs (From admission, onward)     Start     Ordered   10/28/20 0500  CBC with Differential/Platelet  Daily,   R      10/27/20 1512            Signed, Terrilee Croak, MD [facility] 11/02/2020

## 2020-11-03 LAB — CBC WITH DIFFERENTIAL/PLATELET
Basophils Absolute: 0 10*3/uL (ref 0.0–0.1)
Basophils Relative: 0 %
Eosinophils Absolute: 0 10*3/uL (ref 0.0–0.5)
Eosinophils Relative: 0 %
HCT: 23.5 % — ABNORMAL LOW (ref 39.0–52.0)
Hemoglobin: 8.2 g/dL — ABNORMAL LOW (ref 13.0–17.0)
Lymphocytes Relative: 59 %
Lymphs Abs: 0.3 10*3/uL — ABNORMAL LOW (ref 0.7–4.0)
MCH: 36.1 pg — ABNORMAL HIGH (ref 26.0–34.0)
MCHC: 34.9 g/dL (ref 30.0–36.0)
MCV: 103.5 fL — ABNORMAL HIGH (ref 80.0–100.0)
Monocytes Absolute: 0 10*3/uL — ABNORMAL LOW (ref 0.1–1.0)
Monocytes Relative: 4 %
Neutro Abs: 0.2 10*3/uL — CL (ref 1.7–7.7)
Neutrophils Relative %: 37 %
Platelets: 141 10*3/uL — ABNORMAL LOW (ref 150–400)
RBC: 2.27 MIL/uL — ABNORMAL LOW (ref 4.22–5.81)
RDW: 19.6 % — ABNORMAL HIGH (ref 11.5–15.5)
Smear Review: NORMAL
WBC: 0.5 10*3/uL — CL (ref 4.0–10.5)
nRBC: 0 /100 WBC

## 2020-11-03 NOTE — Progress Notes (Signed)
Physical Therapy Treatment Patient Details Name: Gordon Farrell MRN: [id number] DOB: [birth year] Today's Date: 11/03/2020    History of Present Illness Pt is a 75 y/o M admitted on 10/26/20 after a fall. Pt being treated for symptomatic anemia, suspect 2/2 groin hematoma present on admission, suspect 2/2 to recent heart cath on 10/23/20. 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 PMH: a-fib, DVT, on eliquis, HLD, hypothyroid, BPH, pancytopenia, MGUS, chronic R upper back pain.    PT Comments    Pt resting in bed upon PT arrival; pt's wife and her brother present.  Modified independent with bed mobility; SBA with transfers using RW; and SBA ambulating 240 feet with RW (HR and O2 sats WFL on room air).  Pt demonstrating significant improvement in functional mobility today.  No loss of balance noted during sessions activities.  Discussed and educated pt and pt's family on recommendation to use RW for functional mobility; how to appropriately adjust RW for proper pt fit; energy conservation strategies; and pacing with activity: all appearing with good understanding.  Discussed with pt and pt's wife to have SBA with functional mobility outside the home for safety (d/t uneven surfaces).  Answered pt, pt's wife's, and pt's wife's brothers questions during session: all appearing with good understanding and reporting no further PT questions/concerns for discharge home (plan for HHPT, RW, and 3 in 1).   Follow Up Recommendations  Home health PT;Supervision for mobility/OOB     Equipment Recommendations  Rolling walker with 5" wheels;3in1 (PT)    Recommendations for Other Services       Precautions / Restrictions Precautions Precautions: Fall Restrictions Weight Bearing Restrictions: No    Mobility  Bed Mobility Overal bed mobility: Modified Independent Bed Mobility: Supine to Sit;Sit to Supine      Supine to sit: Modified independent (Device/Increase time);HOB elevated Sit to supine: Modified independent (Device/Increase time);HOB elevated   General bed mobility comments: mild increased time to perform on own    Transfers Overall transfer level: Needs assistance Equipment used: Rolling walker (2 wheeled) Transfers: Sit to/from Stand Sit to Stand: Supervision         General transfer comment: x2 trials standing from bed; mild increased effort to stand from bed  Ambulation/Gait Ambulation/Gait assistance: Supervision Gait Distance (Feet): 240 Feet Assistive device: Rolling walker (2 wheeled)   Gait velocity: mildly increased at times   General Gait Details: mostly step through gait pattern; steady with RW use   Stairs Stairs:  (Deferred (pt does not have steps/stairs at home))           Wheelchair Mobility    Modified Rankin (Stroke Patients Only)       Balance Overall balance assessment: Needs assistance Sitting-balance support: No upper extremity supported;Feet supported Sitting balance-Leahy Scale: Normal Sitting balance - Comments: steady sitting reaching outside BOS   Standing balance support: No upper extremity supported Standing balance-Leahy Scale: Good Standing balance comment: steady standing reaching within BOS                            Cognition Arousal/Alertness: Awake/alert Behavior During Therapy: WFL for tasks assessed/performed Overall Cognitive Status: Within Functional Limits for tasks assessed                                 General Comments: A&O x  4      Exercises      General Comments  Nursing cleared pt for participation in physical therapy.  Pt agreeable to PT session.      Pertinent Vitals/Pain Pain Assessment: Faces Faces Pain Scale: Hurts little more Pain Location: R upper back pain Pain Descriptors / Indicators: Discomfort Pain Intervention(s): Limited activity within patient's  tolerance;Monitored during session;Repositioned;Patient requesting pain meds-RN notified    Home Living                      Prior Function            PT Goals (current goals can now be found in the care plan section) Acute Rehab PT Goals Patient Stated Goal: decrease pain PT Goal Formulation: With patient Time For Goal Achievement: 11/10/20 Potential to Achieve Goals: Fair Progress towards PT goals: Progressing toward goals    Frequency    Min 2X/week      PT Plan Discharge plan needs to be updated    Co-evaluation              AM-PAC PT "6 Clicks" Mobility   Outcome Measure  Help needed turning from your back to your side while in a flat bed without using bedrails?: None Help needed moving from lying on your back to sitting on the side of a flat bed without using bedrails?: None Help needed moving to and from a bed to a chair (including a wheelchair)?: None Help needed standing up from a chair using your arms (e.g., wheelchair or bedside chair)?: None Help needed to walk in hospital room?: A Little Help needed climbing 3-5 steps with a railing? : A Little 6 Click Score: 22    End of Session Equipment Utilized During Treatment: Gait belt Activity Tolerance: Patient tolerated treatment well Patient left: in bed;with call bell/phone within reach;with bed alarm set;with family/visitor present Nurse Communication: Mobility status;Precautions;Patient requests pain meds PT Visit Diagnosis: Difficulty in walking, not elsewhere classified (R26.2);Muscle weakness (generalized) (M62.81);Pain Pain - Right/Left: Right Pain - part of body:  (upper back)     Time: 3212-2482 PT Time Calculation (min) (ACUTE ONLY): 38 min  Charges:  $Gait Training: 8-22 mins $Therapeutic Exercise: 8-22 mins $Therapeutic Activity: 8-22 mins                    Leitha Bleak, PT 11/03/20, 4:49 PM

## 2020-11-03 NOTE — Plan of Care (Signed)
Continuing with plan of care. 

## 2020-11-03 NOTE — Progress Notes (Signed)
PROGRESS NOTE  Gordon Farrell  DOB: 1945-09-20  PCP: Danae Orleans, MD SNK:[id number]  DOA: 10/26/2020  LOS: 6 days  Hospital Day: 9   Chief Complaint  Patient presents with   Fall    Brief narrative: Gordon Farrell is a 75 y.o. male with PMH significant for atrial fibrillation, history of DVT, on Eliquis, hyperlipidemia, hypothyroid, BPH, pancytopenia, MGUS, chronic right upper back pain. Patient presented to the ED on 6/23 with complaints of progressive generalized weakness, gait instability and fall   In the ED, he was noted to have stable vitals, low baseline hemoglobin  Admitted for further evaluation management Seen by PT.  We have recommended.  Pending insurance authorization.  Subjective: Patient was seen and examined this morning. Pleasant elderly Caucasian male.  Lying on bed.  Frustrated because of insurance denial for his placement.    Assessment/Plan: Progressive generalized weakness -Multifactorial: MGUS, anemia, UTI, stroke, old-age -PT/OT eval obtained.  Rehab recommended.    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  Acute drop in hemoglobin this admission probably because of right groin hematoma related to cardiac cath. -2 units of PRBC transfusion 6/23 -Hemoglobin overall stable at this time. Recent Labs    10/21/20 1706 10/22/20 0140 10/30/20 0700 10/31/20 0507 11/01/20 0522 11/02/20 0441 11/03/20 0551  HGB  --    < > 7.7* 8.4* 8.3* 8.0* 8.2*  MCV  --    < > 106.0* 106.8* 105.9* 105.3* 103.5*  VITAMINB12 1,280*  --   --   --   --   --   --   TIBC 197*  --   --   --   --   --   --   IRON 41*  --   --   --   --   --   --    < > = values in this interval not displayed.   Right groin hematoma-POA -6/20, patient had cardiac cath. -6/23, on presentation, patient was noted to have right groin hematoma of size 7.2 cm.  Cardiology was consulted. -CT abdomen pelvis was obtained to rule out retroperitoneal  hematoma. -Eliquis has been resumed at a lower dose of 2.5 mg twice daily.  Discussed with cardiologist Dr. Humphrey Rolls on 6/30.  -To follow-up with cardiology as an outpatient.  UTI, right pyelonephritis -6/24, CT abdomen pelvis showed stable mucosal edema in the right renal collecting system compatible with infection.   -Nonsignificant growth of Enterococcus faecalis and Klebsiella in urine. -Completed 5-day course of IV Rocephin on 6/29.   Acute small ischemic cortical infarct -MRI brain showed punctate 3 mm acute ischemic nonhemorrhagic cortical infarct involving the posterior left temporal occipital region.  Likely incidental, no focal neurologic deficits -Neurology consulted.  Eliquis resumed. -PT/OT eval obtained.   Paroxysmal atrial fibrillation -On Eliquis.     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     Diet Order             Diet Heart Room service appropriate? Yes; Fluid consistency: Thin  Diet effective now  DVT prophylaxis:  apixaban (ELIQUIS) tablet 2.5 mg Start: 10/31/20 2200 Place TED hose Start: 10/26/20 1211 apixaban (ELIQUIS) tablet 2.5 mg   Antimicrobials: Completed 5 days of IV Rocephin Fluid: None Consultants: Cardiology Family Communication: Called and updated patient's wife Gordon Farrell this morning.  We will have PT reevaluate patient once his wife comes to the hospital today or tomorrow.  She may make a decision to take him home rather than wait in the hospital for the next several  days.  Status is: Inpatient  Remains inpatient appropriate because: Pending insurance authorizing for rehab  Dispo: The patient is from: Home              Anticipated d/c is to: Home with home health services if wife is agreeable              Patient currently is medically stable to d/c.   Difficult to place patient Yes     Infusions:     Scheduled Meds:  apixaban  2.5 mg Oral BID   atorvastatin  40 mg Oral QHS   finasteride  5 mg Oral Daily   gabapentin  100 mg Oral TID   latanoprost  1 drop Both Eyes QHS   levothyroxine  100 mcg Oral Q0600   pantoprazole  40 mg Oral Daily   pilocarpine  5 mg Oral TID   pyridoxine  100 mg Oral Daily   sertraline  100 mg Oral Daily   timolol  1 drop Right Eye BID   traZODone  100 mg Oral QHS    Antimicrobials: Anti-infectives (From admission, onward)    Start     Dose/Rate Route Frequency Ordered Stop   10/28/20 1000  cefTRIAXone (ROCEPHIN) 1 g in sodium chloride 0.9 % 100 mL IVPB        1 g 200 mL/hr over 30 Minutes Intravenous Every 24 hours 10/28/20 0823 11/01/20 1259       PRN meds: acetaminophen **OR** acetaminophen, antiseptic oral rinse, HYDROcodone-acetaminophen, lip balm, ondansetron **OR** ondansetron (ZOFRAN) IV   Objective: Vitals:   11/03/20 0747 11/03/20 0747  BP: (!) 117/51 (!) 117/51  Pulse: 75 71  Resp: 18 18  Temp: 98.4 F (36.9 C) 98.4 F (36.9 C)  SpO2: 98% 98%    Intake/Output Summary (Last 24 hours) at 11/03/2020 1153 Last data filed at 11/03/2020 6237 Gross per 24 hour  Intake 0 ml  Output 1100 ml  Net -1100 ml    Filed Weights   10/26/20 0600  Weight: 83.5 kg   Weight change:  Body mass index is 27.98 kg/m.   Physical Exam: General exam: Pleasant, elderly Caucasian male.  Not in distress Skin: No rashes, lesions or ulcers. HEENT: Atraumatic, normocephalic, no obvious bleeding Lungs: Clear to auscultation bilaterally CVS: Regular rate and rhythm, no murmur GI/Abd soft, nontender,  nondistended, bowel sound present CNS: Alert, awake, oriented x3 Psychiatry: Mood appropriate Extremities: No edema, no calf tenderness  Data Review: I have personally reviewed the laboratory data and studies available.  Recent Labs  Lab 10/30/20 0700 10/31/20 0507 11/01/20 0522 11/02/20 0441 11/03/20 0551  WBC 0.5* 0.5* 0.6* 0.6* 0.5*  NEUTROABS 0.2* 0.2* 0.2* 0.2* 0.2*  HGB 7.7* 8.4* 8.3* 8.0* 8.2*  HCT 22.9* 25.0* 25.1* 23.9* 23.5*  MCV 106.0* 106.8* 105.9* 105.3* 103.5*  PLT 148* 162 146* 134* 141*    Recent Labs  Lab 10/28/20 0606 10/29/20 0618 10/30/20 0700 11/01/20 0522  NA 136 140 139 142  K 3.6 3.6 3.8  4.0  CL 109 113* 113* 110  CO2 22 21* 23 24  GLUCOSE 101* 112* 97 95  BUN 21 14 11 9   CREATININE 1.02 0.92 0.76 0.87  CALCIUM 7.8* 7.6* 7.7* 8.0*     F/u labs ordered Unresulted Labs (From admission, onward)     Start     Ordered   10/28/20 0500  CBC with Differential/Platelet  Daily,   R      10/27/20 1512            Signed, Terrilee Croak, MD [facility] 11/03/2020

## 2020-11-03 NOTE — Progress Notes (Signed)
Mobility Specialist - Progress Note   11/03/20 1300  Mobility  Activity Ambulated in hall  Level of Assistance Standby assist, set-up cues, supervision of patient - no hands on  Assistive Device Front wheel walker  Distance Ambulated (ft) 120 ft  Mobility Ambulated with assistance in hallway  Mobility Response Tolerated well  Mobility performed by Mobility specialist  $Mobility charge 1 Mobility    Pt lying in bed upon arrival. Pt initially hesitant about OOB activity, but with encouragement, eventually agreeable. Pt ambulated in hallway with RW and supervision. No LOB. Denied SOB on RA. 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 Denied back pain at this time. Pt left in bed with alarm set. Pt eager to be discharged this date.     Kathee Delton Mobility Specialist 11/03/20, 1:49 PM

## 2020-11-03 NOTE — TOC Progression Note (Addendum)
Transition of Care [facility]) - Progression Note    Patient Details  Name: Gordon Farrell MRN: [id number] Date of Birth: Feb 03, 1946  Transition of Care [facility]) CM/SW [place], LCSW Phone Number: 11/03/2020, 9:49 AM  Clinical Narrative:   Per Clarisse Gouge, family chose [facility]. CSW spoke with Ebony Hail from [facility] who confirmed they can accept patient when insurance authorization is obtained. Ebony Hail reported they cannot accept weekend admissions due to staffing, she is checking staffing on 7/4 and will follow up. CSW started new insurance auth in [place] for [place].  11:45- Call from [facility] who reported they will have to cancel the authorization that was started this morning since the appeal is currently in process through the health plan.   1:20- Patient's wife is now considering home with home health. Spoke with wife via phone. She plans to come to the hospital around 3 pm today. Plan for her to watch PT Session with patient and decide if she can meet patient's needs at home. Confirmed home address in chart. Explained home health agency options. Patient's wife would like a RW and 3in1 and home health services if she does decide to take patient home. She reported she would have her neighbor bring her to pick patient up tomorrow for discharge, declines the need for EMS transport home at this time. CSW updated care team will follow up after PT session.   3:40- Spoke to patient's wife. Patient has worked with PT and they would like patient to go home with home health tomorrow. CSW made home health referral to [place] with [facility] and DME order to Rhonda/Zach with [employer]. Asked MD for orders.    Expected Discharge Plan: [place] Barriers to Discharge: Continued Medical Work up  Expected Discharge Plan and Services Expected Discharge Plan: [place] Choice: [place] arrangements for the past 2 months: Single Family Home                                       Social Determinants of Health (SDOH) Interventions    Readmission Risk Interventions No flowsheet data found.

## 2020-11-03 NOTE — Progress Notes (Signed)
[place]  [phone] [phone]) [phone]  ID: Gordon Farrell OB: [birth year]  MR#: [id number]  VXB#:[id number]  Patient Care Team: Danae Orleans, MD as PCP - General (Internal Medicine)  CHIEF COMPLAINT: Pancytopenia  INTERVAL HISTORY: Patient feels significantly improved since admission and no longer is febrile.  Hemoglobin appears to have stabilized.  Patient reports he is being discharged today.  He offers no further complaints.  REVIEW OF SYSTEMS:   Review of Systems  Constitutional:  Positive for malaise/fatigue. Negative for fever and weight loss.  Respiratory: Negative.  Negative for cough, hemoptysis and shortness of breath.   Cardiovascular: Negative.  Negative for chest pain and leg swelling.  Gastrointestinal: Negative.  Negative for blood in stool, diarrhea and melena.  Genitourinary: Negative.  Negative for hematuria.  Musculoskeletal: Negative.  Negative for back pain.  Skin: Negative.  Negative for rash.  Neurological:  Positive for weakness. Negative for dizziness, focal weakness and headaches.  Psychiatric/Behavioral: Negative.  The patient is not nervous/anxious.    As per HPI. Otherwise, a complete review of systems is negative.  PAST MEDICAL HISTORY: Past Medical History:  Diagnosis Date   Anemia    Dysrhythmia    Hypothyroidism     PAST SURGICAL HISTORY: Past Surgical History:  Procedure Laterality Date   BACK SURGERY     CHOLECYSTECTOMY     COLON RESECTION     multiple times   LEFT HEART CATH AND CORONARY ANGIOGRAPHY N/A 10/23/2020   Procedure: LEFT HEART CATH AND CORONARY ANGIOGRAPHY with coronary intervention;  Surgeon: Dionisio David, MD;  Location: [facility];  Service: Cardiovascular;  Laterality: N/A;   TOTAL HIP ARTHROPLASTY  05/06/2010    FAMILY HISTORY: Family History  Problem Relation Age of Onset   Osteoporosis Mother     ADVANCED DIRECTIVES (Y/N):  _0 @  HEALTH MAINTENANCE: Social  History   Tobacco Use   Smoking status: Never   Smokeless tobacco: Never  Vaping Use   Vaping Use: Never used  Substance Use Topics   Alcohol use: No    Alcohol/week: 0.0 standard drinks   Drug use: Never     Colonoscopy:  PAP:  Bone density:  Lipid panel:  No Known Allergies  Current Facility-Administered Medications  Medication Dose Route Frequency Provider Last Rate Last Admin   acetaminophen (TYLENOL) tablet 650 mg  650 mg Oral Q6H PRN Alma Friendly, MD   650 mg at 11/02/20 1220   Or   acetaminophen (TYLENOL) suppository 650 mg  650 mg Rectal Q6H PRN Alma Friendly, MD       antiseptic oral rinse (BIOTENE) solution 15 mL  15 mL Mouth Rinse PRN Alma Friendly, MD       apixaban Arne Cleveland) tablet 2.5 mg  2.5 mg Oral BID Alma Friendly, MD   2.5 mg at 11/03/20 0934   atorvastatin (LIPITOR) tablet 40 mg  40 mg Oral QHS Cox, Amy N, DO   40 mg at 11/02/20 2116   finasteride (PROSCAR) tablet 5 mg  5 mg Oral Daily Cox, Amy N, DO   5 mg at 11/03/20 0934   gabapentin (NEURONTIN) capsule 100 mg  100 mg Oral TID Cox, Amy N, DO   100 mg at 11/03/20 0934   HYDROcodone-acetaminophen (NORCO/VICODIN) 5-325 MG per tablet 1 tablet  1 tablet Oral Q6H PRN Alma Friendly, MD   1 tablet at 11/03/20 0725   latanoprost (XALATAN) 0.005 % ophthalmic solution 1 drop  1 drop Both  Eyes QHS Dallie Piles, RPH   1 drop at 11/02/20 2116   levothyroxine (SYNTHROID) tablet 100 mcg  100 mcg Oral Q0600 Cox, Amy N, DO   100 mcg at 11/03/20 0506   lip balm (BLISTEX) ointment   Topical PRN Alma Friendly, MD       ondansetron [facility]) tablet 4 mg  4 mg Oral Q6H PRN Cox, Amy N, DO       Or   ondansetron (ZOFRAN) injection 4 mg  4 mg Intravenous Q6H PRN Cox, Amy N, DO   4 mg at 10/26/20 2014   pantoprazole (PROTONIX) EC tablet 40 mg  40 mg Oral Daily Cox, Amy N, DO   40 mg at 11/03/20 0934   pilocarpine (SALAGEN) tablet 5 mg  5 mg Oral TID Cox, Amy N, DO   5 mg at 11/03/20 1318    pyridOXINE (VITAMIN B-6) tablet 100 mg  100 mg Oral Daily Cox, Amy N, DO   100 mg at 11/03/20 0934   sertraline (ZOLOFT) tablet 100 mg  100 mg Oral Daily Cox, Amy N, DO   100 mg at 11/03/20 0934   timolol (TIMOPTIC) 0.5 % ophthalmic solution 1 drop  1 drop Right Eye BID Cox, Amy N, DO   1 drop at 11/03/20 0935   traZODone (DESYREL) tablet 100 mg  100 mg Oral QHS Cox, Amy N, DO   100 mg at 11/02/20 2116    OBJECTIVE: Vitals:   11/03/20 0747 11/03/20 0747  BP: (!) 117/51 (!) 117/51  Pulse: 75 71  Resp: 18 18  Temp: 98.4 F (36.9 C) 98.4 F (36.9 C)  SpO2: 98% 98%     Body mass index is 27.98 kg/m.    ECOG FS:1 - Symptomatic but completely ambulatory  General: Well-developed, well-nourished, no acute distress. Eyes: Pink conjunctiva, anicteric sclera. HEENT: Normocephalic, moist mucous membranes. Lungs: No audible wheezing or coughing. Heart: Regular rate and rhythm. Abdomen: Soft, nontender, no obvious distention. Musculoskeletal: No edema, cyanosis, or clubbing. Neuro: Alert, answering all questions appropriately. Cranial nerves grossly intact. Skin: No rashes or petechiae noted. Psych: Normal affect.   LAB RESULTS:  Lab Results  Component Value Date   NA 142 11/01/2020   K 4.0 11/01/2020   CL 110 11/01/2020   CO2 24 11/01/2020   GLUCOSE 95 11/01/2020   BUN 9 11/01/2020   CREATININE 0.87 11/01/2020   CALCIUM 8.0 (L) 11/01/2020   PROT 5.9 (L) 10/26/2020   ALBUMIN 2.6 (L) 10/26/2020   AST 28 10/26/2020   ALT 19 10/26/2020   ALKPHOS 73 10/26/2020   BILITOT 1.3 (H) 10/26/2020   GFRNONAA >60 11/01/2020    Lab Results  Component Value Date   WBC 0.5 (LL) 11/03/2020   NEUTROABS 0.2 (LL) 11/03/2020   HGB 8.2 (L) 11/03/2020   HCT 23.5 (L) 11/03/2020   MCV 103.5 (H) 11/03/2020   PLT 141 (L) 11/03/2020     STUDIES: CT ABDOMEN PELVIS WO CONTRAST  Result Date: 10/27/2020 CLINICAL DATA:  Decreased hemoglobin. Taking Eliquis for atrial fibrillation. Clinical  concern for retroperitoneal hematoma. Fell today. EXAM: CT ABDOMEN AND PELVIS WITHOUT CONTRAST TECHNIQUE: Multidetector CT imaging of the abdomen and pelvis was performed following the standard protocol without IV contrast. COMPARISON:  Yesterday. FINDINGS: Lower chest: The heart remains borderline enlarged. No significant change in small bilateral pleural effusions and bilateral lower lobe atelectasis. Stable old, healed right lower anterior rib fractures. Hepatobiliary: No focal liver abnormality is seen. Status post cholecystectomy. No  biliary dilatation. Pancreas: Stable moderate diffuse pancreatic atrophy. Spleen: Stable borderline enlarged spleen. Adrenals/Urinary Tract: Stable mild-to-moderate dilatation of the right renal collecting system with a right ureteral stent in satisfactory position. Retained excreted contrast in the right renal collecting system with mild mucosal edema. Stable multiple bilateral renal calculi. Normal appearing adrenal glands, left ureter and urinary bladder. Stomach/Bowel: Stomach is within normal limits. Appendix appears normal. No evidence of bowel wall thickening, distention, or inflammatory changes. Vascular/Lymphatic: No significant vascular findings are present. No enlarged abdominal or pelvic lymph nodes. Reproductive: Prostate gland grossly normal in size, partially obscured by artifacts from a right hip prosthesis. Other: Anterior abdominal wall surgical wires. Small bilateral inguinal hernias containing fat. Musculoskeletal: Right hip prosthesis with associated streak artifacts. Stable approximately 10% T12 superior endplate compression deformity and mild Schmorl's node formation. No acute fracture lines. Minimal bony retropulsion. IMPRESSION: 1. No retroperitoneal hemorrhage or other interval acute abnormality seen. 2. Stable small bilateral pleural effusions and bilateral lower lobe atelectasis. 3. Stable mild-to-moderate right hydronephrosis with a right ureteral stent  in satisfactory position. 4. Stable mucosal edema involving the right renal collecting system, compatible with infection. 5. Stable bilateral renal calculi. Electronically Signed   By: Claudie Revering M.D.   On: 10/27/2020 16:29   DG Chest 2 View  Result Date: 10/20/2020 CLINICAL DATA:  Chest pain EXAM: CHEST - 2 VIEW COMPARISON:  None. FINDINGS: Small bilateral pleural effusions. No focal consolidation. Normal cardiomediastinal silhouette. No pneumothorax. Clips over the right chest. Linear atelectasis left base IMPRESSION: Small bilateral pleural effusions. Electronically Signed   By: Donavan Foil M.D.   On: 10/20/2020 20:35   CT Head Wo Contrast  Result Date: 10/26/2020 CLINICAL DATA:  Minor head trauma EXAM: CT HEAD WITHOUT CONTRAST CT CERVICAL SPINE WITHOUT CONTRAST TECHNIQUE: Multidetector CT imaging of the head and cervical spine was performed following the standard protocol without intravenous contrast. Multiplanar CT image reconstructions of the cervical spine were also generated. COMPARISON:  Brain MRI 06/15/2020 FINDINGS: CT HEAD FINDINGS Brain: No evidence of acute infarction, hemorrhage, hydrocephalus, extra-axial collection or mass lesion/mass effect. Generalized atrophy Vascular: No hyperdense vessel or unexpected calcification. Skull: Normal. Negative for fracture or focal lesion. Sinuses/Orbits: No acute finding. CT CERVICAL SPINE FINDINGS Alignment: No traumatic malalignment Skull base and vertebrae: No acute fracture. Remote T1 spinous process fracture. Soft tissues and spinal canal: No prevertebral fluid or swelling. No visible canal hematoma. Disc levels: Ordinary disc and facet degeneration which is generalized. Upper chest: Reported separately IMPRESSION: No evidence of intracranial or cervical spine injury. Electronically Signed   By: Monte Farrell M.D.   On: 10/26/2020 09:53   CT Cervical Spine Wo Contrast  Result Date: 10/26/2020 CLINICAL DATA:  Minor head trauma EXAM: CT HEAD  WITHOUT CONTRAST CT CERVICAL SPINE WITHOUT CONTRAST TECHNIQUE: Multidetector CT imaging of the head and cervical spine was performed following the standard protocol without intravenous contrast. Multiplanar CT image reconstructions of the cervical spine were also generated. COMPARISON:  Brain MRI 06/15/2020 FINDINGS: CT HEAD FINDINGS Brain: No evidence of acute infarction, hemorrhage, hydrocephalus, extra-axial collection or mass lesion/mass effect. Generalized atrophy Vascular: No hyperdense vessel or unexpected calcification. Skull: Normal. Negative for fracture or focal lesion. Sinuses/Orbits: No acute finding. CT CERVICAL SPINE FINDINGS Alignment: No traumatic malalignment Skull base and vertebrae: No acute fracture. Remote T1 spinous process fracture. Soft tissues and spinal canal: No prevertebral fluid or swelling. No visible canal hematoma. Disc levels: Ordinary disc and facet degeneration which is generalized. Upper chest: Reported  separately IMPRESSION: No evidence of intracranial or cervical spine injury. Electronically Signed   By: Monte Farrell M.D.   On: 10/26/2020 09:53   MR ANGIO HEAD WO CONTRAST  Result Date: 10/27/2020 CLINICAL DATA:  Stroke workup. EXAM: MRA NECK WITHOUT AND WITH CONTRAST MRA HEAD WITHOUT CONTRAST TECHNIQUE: Multiplanar and multiecho pulse sequences of the neck were obtained without and with intravenous contrast. Angiographic images of the neck were obtained using MRA technique without and with intravenous contrast; Angiographic images of the Circle of Willis were obtained using MRA technique without intravenous contrast. CONTRAST:  7.30m GADAVIST GADOBUTROL 1 MMOL/ML IV SOLN COMPARISON:  None. FINDINGS: MRA NECK FINDINGS Motion limited evaluation. Aorta: Great vessel origins are patent. Right carotid system: Patent without evidence of flow limiting stenosis. Left carotid system: Patent without evidence of flow limiting stenosis. Vertebral arteries: Left dominant. Patent  without evidence of flow limiting stenosis. MRA HEAD FINDINGS Anterior circulation: Bilateral ICAs, MCAs, and ACAs are patent without proximal flow limiting stenosis. No aneurysm identified. Posterior circulation: Bilateral intradural vertebral arteries, basilar artery and posterior cerebral arteries are patent without proximal flow limiting stenosis. Prominent bilateral posterior communicating arteries. No aneurysm identified. IMPRESSION: No evidence of a large vessel occlusion or proximal flow limiting stenosis in the head or neck. Electronically Signed   By: FMargaretha SheffieldMD   On: 10/27/2020 19:53   MR ANGIO NECK W WO CONTRAST  Result Date: 10/27/2020 CLINICAL DATA:  Stroke workup. EXAM: MRA NECK WITHOUT AND WITH CONTRAST MRA HEAD WITHOUT CONTRAST TECHNIQUE: Multiplanar and multiecho pulse sequences of the neck were obtained without and with intravenous contrast. Angiographic images of the neck were obtained using MRA technique without and with intravenous contrast; Angiographic images of the Circle of Willis were obtained using MRA technique without intravenous contrast. CONTRAST:  7.577mGADAVIST GADOBUTROL 1 MMOL/ML IV SOLN COMPARISON:  None. FINDINGS: MRA NECK FINDINGS Motion limited evaluation. Aorta: Great vessel origins are patent. Right carotid system: Patent without evidence of flow limiting stenosis. Left carotid system: Patent without evidence of flow limiting stenosis. Vertebral arteries: Left dominant. Patent without evidence of flow limiting stenosis. MRA HEAD FINDINGS Anterior circulation: Bilateral ICAs, MCAs, and ACAs are patent without proximal flow limiting stenosis. No aneurysm identified. Posterior circulation: Bilateral intradural vertebral arteries, basilar artery and posterior cerebral arteries are patent without proximal flow limiting stenosis. Prominent bilateral posterior communicating arteries. No aneurysm identified. IMPRESSION: No evidence of a large vessel occlusion or  proximal flow limiting stenosis in the head or neck. Electronically Signed   By: FrMargaretha SheffieldD   On: 10/27/2020 19:53   MR BRAIN WO CONTRAST  Result Date: 10/27/2020 CLINICAL DATA:  Initial evaluation for syncope, fall. EXAM: MRI HEAD WITHOUT CONTRAST TECHNIQUE: Multiplanar, multiecho pulse sequences of the brain and surrounding structures were obtained without intravenous contrast. COMPARISON:  Prior CT from earlier the same day. FINDINGS: Brain: Diffuse prominence of the CSF containing spaces compatible generalized cerebral atrophy. Mild chronic microvascular ischemic disease noted involving the periventricular white matter. Punctate 3 mm focus of restricted diffusion seen involving the cortex of the posterior left temporal occipital region (series 5, image 22). Finding consistent with a small acute ischemic infarct. No blood products seen within this region on corresponding SWI sequence or prior CT. No mass effect. No other diffusion abnormality to suggest acute or subacute ischemia. Apparent vague diffusion signal overlying the frontal convexities on axial DWI sequence favored to be artifactual in nature. Gray-white matter differentiation maintained. No encephalomalacia to suggest chronic cortical  infarction. No other evidence for acute or chronic intracranial hemorrhage. No mass lesion, midline shift or mass effect. No hydrocephalus or extra-axial fluid collection. Partially empty sella noted. Suprasellar region normal. Midline structures intact. Vascular: Major intracranial vascular flow voids are maintained. Skull and upper cervical spine: Craniocervical junction within normal limits. Bone marrow signal intensity normal. Probable small contusion noted at the left parietal scalp. Sinuses/Orbits: Sequelae of prior bilateral ocular lens replacement. Globes and orbital soft tissues demonstrate no acute finding. Paranasal sinuses are clear. Small right mastoid effusion noted, of doubtful significance.  Inner ear structures grossly normal. Visualized nasopharynx unremarkable. Other: None. IMPRESSION: 1. 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 2. No other acute intracranial abnormality. 3. Mild age-related cerebral atrophy with chronic small vessel ischemic disease. 4. Small left parietal scalp contusion. Electronically Signed   By: Jeannine Boga M.D.   On: 10/27/2020 00:39   MR Lumbar Spine W Wo Contrast  Result Date: 10/27/2020 CLINICAL DATA:  Initial evaluation for numbness and tingling, recent fall. Evaluate for stenosis. EXAM: MRI LUMBAR SPINE WITHOUT AND WITH CONTRAST TECHNIQUE: Multiplanar and multiecho pulse sequences of the lumbar spine were obtained without and with intravenous contrast. CONTRAST:  53m GADAVIST GADOBUTROL 1 MMOL/ML IV SOLN COMPARISON:  Prior CT from 10/26/2020. FINDINGS: Segmentation: Standard. Lowest well-formed disc space labeled the L5-S1 level. Alignment: Trace retrolisthesis of L4 on L5, with 2 mm anterolisthesis of L5 on S1. Findings chronic and facet mediated. Alignment otherwise normal preservation of the normal lumbar lordosis. Vertebrae: Chronic T12 compression fracture, partially visualized. Vertebral body height otherwise maintained with no other acute or chronic fracture. Bone marrow signal intensity within normal limits. No discrete or worrisome osseous lesions. No abnormal marrow edema or enhancement. Conus medullaris and cauda equina: Conus extends to the L1-2 level. Conus and cauda equina appear normal. Paraspinal and other soft tissues: Paraspinous soft tissues demonstrate no acute finding. Few scattered benign appearing cyst noted about the kidneys, better evaluated on prior CT. Visualized visceral structures otherwise unremarkable. Disc levels: L1-2: Negative interspace. Mild facet hypertrophy. No stenosis or impingement. L2-3: Mild anterior endplate spurring without significant disc  bulge. Mild facet hypertrophy. No canal or foraminal stenosis. L3-4: Disc desiccation with mild annular disc bulge. Mild facet hypertrophy. No canal or foraminal stenosis. No impingement. L4-5: Mild disc bulge with disc desiccation. Moderate left worse than right facet hypertrophy. Superimposed tiny 4 mm cystic lesion noted at the left ligamentum flavum, which could reflect a small synovial cyst versus cystic ligamentous degeneration (series 8, image 29). Resultant mild narrowing of the lateral recesses bilaterally. Central canal remains patent. Mild left L4 foraminal narrowing. Right neural foramina remains patent. L5-S1: Trace anterolisthesis. Degenerative intervertebral disc space narrowing with disc desiccation and mild disc bulge. Moderate bilateral facet hypertrophy. No canal or lateral recess stenosis. Mild right L5 foraminal narrowing. Left neural foramen remains patent. No frank impingement. IMPRESSION: 1. No evidence for acute injury related to recent fall. 2. Mild disc bulging with moderate facet hypertrophy at L4-5, resulting in mild bilateral lateral recess stenosis with mild left L4 foraminal narrowing. 3. Trace anterolisthesis of L5 on S1 with associated mild disc bulge and moderate facet hypertrophy, resulting in mild right L5 foraminal stenosis. No other significant stenosis or neural impingement within the lumbar spine. 4. Chronic T12 compression fracture, partially visualized. Electronically Signed   By: BJeannine BogaM.D.   On: 10/27/2020 01:02   CARDIAC CATHETERIZATION  Result Date: 10/23/2020  Mid Cx  lesion is 30% stenosed.  Cardiac catheterization revealed 30% disease in the mid left circumflex with normal LAD and right coronary and normal ejection fraction on echocardiogram.  Patient has no significant coronary artery disease and can be discharged with follow-up this Thursday at 9 AM in my office.   CT CHEST ABDOMEN PELVIS W CONTRAST  Result Date: 10/26/2020 CLINICAL DATA:   Fall in the setting of generalized weakness. Low back pain. EXAM: CT CHEST, ABDOMEN, AND PELVIS WITH CONTRAST TECHNIQUE: Multidetector CT imaging of the chest, abdomen and pelvis was performed following the standard protocol during bolus administration of intravenous contrast. CONTRAST:  165m OMNIPAQUE IOHEXOL 300 MG/ML  SOLN COMPARISON:  None. FINDINGS: CT CHEST FINDINGS Cardiovascular: Normal heart size. No pericardial effusion. Coronary atherosclerosis. Mediastinum/Nodes: Negative for adenopathy or mass. Lungs/Pleura: Dependent atelectasis with small pleural effusions. No pulmonary edema or air leak. No hemothorax. Musculoskeletal: Negative for acute fracture or subluxation. Remote right anterior rib fractures. Remote T1 spinous process fracture. CT ABDOMEN PELVIS FINDINGS Hepatobiliary: No focal liver abnormality.Cholecystectomy. No bile duct dilatation Pancreas: Generalized atrophy. Spleen: No acute finding. Adrenals/Urinary Tract: Negative adrenals. Multiple renal calculi, more numerous on the left where there is generalized involvement and a dominant 1 cm lower pole stone. On the right the largest stone is at the upper pole and measures 8 mm. Right internal ureteral stent and located position. Mild urothelial thickening at the right ureter which is likely reactive. The bladder is partially obscured by hip prosthesis but unremarkable where seen. Stomach/Bowel:  No obstruction. No visible inflammation Vascular/Lymphatic: Atheromatous calcification of the aorta. Fat stranding around the right common femoral and superficial femoral arteries which is attributed to recent coronary angiography. No discrete hematoma or pseudoaneurysm. No mass or adenopathy. Reproductive:No acute finding. Other: No ascites or pneumoperitoneum. Fatty enlargement of the inguinal canals. Musculoskeletal: Right hip arthroplasty. Multilevel bridging osteophytes in the lower thoracic and lumbar spine. IMPRESSION: 1. No posttraumatic  finding. 2. Changes of right femoral access for cardiac catheterization. No hematoma. 3. Dependent atelectasis and trace pleural effusions with mild progression from CT 6 days ago. 4. Bilateral nephrolithiasis. Normally located right-sided ureteral stent. Electronically Signed   By: JMonte FantasiaM.D.   On: 10/26/2020 09:49   UK[place]Lower Ext Art Right Ltd  Result Date: 10/26/2020 CLINICAL DATA:  Right groin pain and swelling. Recent heart catheterization. EXAM: RIGHT LOWER EXTREMITY ARTERIAL DUPLEX - LIMITED TECHNIQUE: Imaging was performed at the area of concern in the right groin. COMPARISON:  None. FINDINGS: Images were obtained over the right groin. Normal triphasic Doppler flow in the right common femoral artery. There is color Doppler flow in the adjacent common femoral vein. There is no evidence to suggest a pseudoaneurysm or AV fistula. There is a heterogeneous hypoechoic structure in the right groin that is compatible with hematoma that measures 6.2 x 4.5 x 7.2 cm. There is no definite arterial flow within this heterogeneous collection. IMPRESSION: 1. Right groin hematoma measuring up to 7.2 cm. 2. No evidence for a pseudoaneurysm or AV fistula in the right groin. Electronically Signed   By: AMarkus DaftM.D.   On: 10/26/2020 11:45   CT L-SPINE NO CHARGE  Result Date: 10/26/2020 CLINICAL DATA:  Fall EXAM: CT Lumbar spine with contrast TECHNIQUE: Multiplanar CT images of the lumbar spine were reconstructed from contemporary CT of the abdomen and pelvis CONTRAST:  No additional COMPARISON:  Correlation made with CT abdomen October 20, 2020 FINDINGS: Segmentation: 5 lumbar type vertebrae. Alignment: Preserved. Vertebrae: Stable lumbar  vertebral body heights. Chronic T12 compression fracture. There is no acute fracture. Paraspinal and other soft tissues: Extra-spinal findings are better evaluated on concurrent dedicated imaging. There is no paraspinal hematoma. Right total hip arthroplasty with associated  streak artifact. Disc levels: Mild multilevel degenerative changes are present, greatest at L5-S1. No high-grade osseous encroachment on the spinal canal. IMPRESSION: No acute lumbar spine fracture. Electronically Signed   By: Macy Mis M.D.   On: 10/26/2020 09:19   DG Chest Port 1 View  Result Date: 10/26/2020 CLINICAL DATA:  Shortness of breath fell in bathroom EXAM: PORTABLE CHEST 1 VIEW COMPARISON:  10/26/2020 FINDINGS: Clips over the right chest. Stable cardiomediastinal silhouette. Decreased vascular congestion compared to prior. Streaky atelectasis at the bases. No pneumothorax. IMPRESSION: Streaky atelectasis at the bases. Decreased vascular congestion compared to prior. Electronically Signed   By: Donavan Foil M.D.   On: 10/26/2020 15:41   DG Chest Port 1 View  Result Date: 10/26/2020 CLINICAL DATA:  Questionable sepsis. EXAM: PORTABLE CHEST 1 VIEW COMPARISON:  CT 10/20/2020.  Chest x-ray 10/20/2020. FINDINGS: Cardiomegaly with bilateral interstitial prominence and small bilateral effusions. Findings consistent with CHF. Surgical clips right upper quadrant. Degenerative change and scoliosis thoracic spine. IMPRESSION: Cardiomegaly with bilateral interstitial prominence and small bilateral pleural effusions. Findings consistent with CHF. Electronically Signed   By: Marcello Moores  Register   On: 10/26/2020 08:12   ECHOCARDIOGRAM COMPLETE  Result Date: 10/21/2020    ECHOCARDIOGRAM REPORT   Patient Name:   Gordon Farrell Date of Exam: 10/21/2020 Medical Rec #:  [id number]     Height:       68.0 in Accession #:    [id number]    Weight:       182.5 lb Date of Birth:  November 15, 1945     BSA:          1.966 m Patient Age:    75 years      BP:           124/80 mmHg Patient Gender: M             HR:           109 bpm. Exam Location:  [facility] Procedure: 2D Echo Indications:     Chest Pain R07.9  History:         Patient has no prior history of Echocardiogram examinations.  Sonographer:     Kathlen Brunswick RDCS  Referring Phys:  [place] Diagnosing Phys: Neoma Laming MD IMPRESSIONS  1. Left ventricular ejection fraction, by estimation, is 55 to 60%. The left ventricle has normal function. The left ventricle has no regional wall motion abnormalities. Left ventricular diastolic parameters were normal.  2. Right ventricular systolic function is normal. The right ventricular size is normal.  3. The mitral valve is normal in structure. Trivial mitral valve regurgitation. No evidence of mitral stenosis.  4. The aortic valve is normal in structure. Aortic valve regurgitation is not visualized. Mild aortic valve sclerosis is present, with no evidence of aortic valve stenosis.  5. The inferior vena cava is normal in size with greater than 50% respiratory variability, suggesting right atrial pressure of 3 mmHg. FINDINGS  Left Ventricle: Left ventricular ejection fraction, by estimation, is 55 to 60%. The left ventricle has normal function. The left ventricle has no regional wall motion abnormalities. The left ventricular internal cavity size was normal in size. There is  borderline left ventricular hypertrophy. Left ventricular diastolic parameters were normal. Right Ventricle: The right ventricular  size is normal. No increase in right ventricular wall thickness. Right ventricular systolic function is normal. Left Atrium: Left atrial size was normal in size. Right Atrium: Right atrial size was normal in size. Pericardium: There is no evidence of pericardial effusion. Mitral Valve: The mitral valve is normal in structure. Trivial mitral valve regurgitation. No evidence of mitral valve stenosis. Tricuspid Valve: The tricuspid valve is normal in structure. Tricuspid valve regurgitation is trivial. No evidence of tricuspid stenosis. Aortic Valve: The aortic valve is normal in structure. Aortic valve regurgitation is not visualized. Mild aortic valve sclerosis is present, with no evidence of aortic valve stenosis. Aortic  valve peak gradient measures 4.5 mmHg. Pulmonic Valve: The pulmonic valve was normal in structure. Pulmonic valve regurgitation is not visualized. No evidence of pulmonic stenosis. Aorta: The aortic root is normal in size and structure. Venous: The inferior vena cava is normal in size with greater than 50% respiratory variability, suggesting right atrial pressure of 3 mmHg. IAS/Shunts: No atrial level shunt detected by color flow Doppler.  LEFT VENTRICLE PLAX 2D LVIDd:         5.00 cm  Diastology LVIDs:         3.50 cm  LV e' medial:    8.70 cm/s LV PW:         1.30 cm  LV E/e' medial:  9.5 LV IVS:        1.20 cm  LV e' lateral:   10.70 cm/s LVOT diam:     2.00 cm  LV E/e' lateral: 7.7 LV SV:         57 LV SV Index:   29 LVOT Area:     3.14 cm  RIGHT VENTRICLE RV Basal diam:  3.70 cm RV S prime:     11.90 cm/s TAPSE (M-mode): 1.8 cm LEFT ATRIUM             Index       RIGHT ATRIUM           Index LA diam:        3.10 cm 1.58 cm/m  RA Area:     15.20 cm LA Vol (A2C):   37.6 ml 19.13 ml/m RA Volume:   40.10 ml  20.40 ml/m LA Vol (A4C):   49.1 ml 24.98 ml/m LA Biplane Vol: 44.8 ml 22.79 ml/m  AORTIC VALVE AV Area (Vmax): 2.86 cm AV Vmax:        106.00 cm/s AV Peak Grad:   4.5 mmHg LVOT Vmax:      96.60 cm/s LVOT Vmean:     70.800 cm/s LVOT VTI:       0.183 m  AORTA Ao Root diam: 3.50 cm Ao Asc diam:  3.30 cm MITRAL VALVE MV Area (PHT): 3.58 cm    SHUNTS MV Decel Time: 212 msec    Systemic VTI:  0.18 m MV E velocity: 82.30 cm/s  Systemic Diam: 2.00 cm MV A velocity: 57.40 cm/s MV E/A ratio:  1.43 Neoma Laming MD Electronically signed by Neoma Laming MD Signature Date/Time: 10/21/2020/10:13:09 AM    Final    [place] RT LOWER EXTREM LTD SOFT TISSUE NON VASCULAR  Result Date: 10/26/2020 CLINICAL DATA:  Right groin hematoma EXAM: ULTRASOUND right LOWER EXTREMITY LIMITED TECHNIQUE: Ultrasound examination of the lower extremity soft tissues was performed in the area of clinical concern. COMPARISON:  10/27/2018  FINDINGS: Targeted ultrasound of the right groin performed in the region of previously demonstrated groin hematoma. Heterogeneous hypo and hyperechoic mass  in the right groin, this measures 5.9 x 5 x 7.2 cm, previous measurements of 6.2 x 4.5 x 7.2 cm. This is grossly stable in size IMPRESSION: Grossly stable 7.2 cm right groin hematoma. Electronically Signed   By: Donavan Foil M.D.   On: 10/26/2020 18:25   CT Angio Chest/Abd/Pel for Dissection W and/or Wo Contrast  Result Date: 10/20/2020 CLINICAL DATA:  Chest pain today. EXAM: CT ANGIOGRAPHY CHEST, ABDOMEN AND PELVIS TECHNIQUE: Non-contrast CT of the chest was initially obtained. Multidetector CT imaging through the chest, abdomen and pelvis was performed using the standard protocol during bolus administration of intravenous contrast. Multiplanar reconstructed images and MIPs were obtained and reviewed to evaluate the vascular anatomy. CONTRAST:  163m OMNIPAQUE IOHEXOL 350 MG/ML SOLN COMPARISON:  Radiograph earlier today. FINDINGS: CTA CHEST FINDINGS Cardiovascular: No aortic hematoma noncontrast exam. Thoracic aorta is normal in caliber. No aneurysm. No dissection, evidence of acute aortic syndrome or vasculitis. The pulmonary arteries are well opacified to the subsegmental level. There is no pulmonary embolus. Heart is normal in size. Trace pericardial effusion. Mild coronary artery calcifications. Mediastinum/Nodes: Calcified left hilar lymph nodes consistent with prior granulomatous disease. No noncalcified adenopathy. Scattered small mediastinal lymph nodes are not enlarged by size criteria. Mild esophageal wall thickening with questionable paraesophageal edema and intraluminal debris in the mid esophagus. No pneumomediastinum. No thyroid nodule. Lungs/Pleura: Small bilateral pleural effusions with adjacent compressive atelectasis. Left lower lobe calcification may be a calcified granuloma or pleural calcification. Mild central bronchial thickening. No  pneumothorax. No pulmonary mass. Musculoskeletal: Mild anterior wedging of T1, T3, and T12 vertebral bodies, no adjacent soft tissue thickening or inflammation to suggest acuity. No focal bone lesion. Mild multilevel thoracic spondylosis with endplate spurring. Surgical clips posterior to the right scapula. Review of the MIP images confirms the above findings. CTA ABDOMEN AND PELVIS FINDINGS VASCULAR Aorta: Normal caliber aorta without aneurysm, dissection, vasculitis or significant stenosis. Mild aortic atherosclerosis. Celiac: Patent without evidence of aneurysm, dissection, vasculitis or significant stenosis. Separate origins of the right left hepatic artery from the celiac axis. SMA: Patent without evidence of aneurysm, dissection, vasculitis or significant stenosis. Renals: Both renal arteries are patent without evidence of aneurysm, dissection, vasculitis, fibromuscular dysplasia or significant stenosis. IMA: Patent without evidence of aneurysm, dissection, vasculitis or significant stenosis. Inflow: Patent without evidence of aneurysm, dissection, vasculitis or significant stenosis. Veins: No obvious venous abnormality within the limitations of this arterial phase study. Review of the MIP images confirms the above findings. NON-VASCULAR Hepatobiliary: No evidence of focal liver abnormality on this arterial exam. Post cholecystectomy. No biliary dilatation. Pancreas: No ductal dilatation or inflammation. Spleen: Mildly enlarged spanning 13.8 cm cranial caudal. No focal abnormality on arterial phase imaging. Adrenals/Urinary Tract: No adrenal nodule. Right ureteral stent in place with pigtail in the right renal pelvis and urinary bladder. 4 mm stone in the right distal ureter adjacent to the stent, series 5, image 175. Likely tiny stone fragment slightly more proximally, series 5, image 170. Mild stranding about the distal aspect of the ureter and stent. There is mild right hydronephrosis and hydroureter  despite stent placement. Nonobstructing 8 mm stone in the upper right kidney. Punctate nonobstructing stone in the mid right kidney. 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 There are multiple nonobstructing intrarenal left renal calculi. Mild thinning of the left renal parenchyma. Small cyst arises from the lower left kidney. There is no left hydronephrosis. No left ureteral stones. Urinary bladder is partially distended, no stones in  the bladder. No bladder wall thickening. Stomach/Bowel: Stomach is decompressed. Normal positioning of the duodenum and ligament of Treitz. No small bowel obstruction or inflammation. Normal appendix tentatively visualized. Fluid/liquid stool in the right colon. Air-filled transverse, descending, and proximal sigmoid colon. There is no colonic wall thickening or inflammation. No bowel obstruction. Lymphatic: No abdominopelvic adenopathy. Reproductive: Prostate gland not seen, atrophic or surgically absent. Other: Postsurgical change of the anterior abdominal wall. No ascites. No free air. No focal fluid collection. Bilateral fat containing inguinal hernias. Musculoskeletal: Right hip arthroplasty. Moderate degenerative change of the left hip. Degenerative change throughout the lumbar spine without acute lumbar abnormality. Review of the MIP images confirms the above findings. IMPRESSION: 1. No aortic dissection or acute aortic abnormality. Mild aortic atherosclerosis. No pulmonary embolus. 2. Small bilateral pleural effusions with adjacent compressive atelectasis. Mild central bronchial thickening. 3. Mild esophageal wall thickening with question of paraesophageal stranding and luminal debris in the mid esophagus. Query esophagitis. Endoscopy may be of value if not recently performed for more detailed esophageal assessment. 4. Right ureteral stent in place with 4 mm stone in the right distal ureter adjacent to the stent. Likely tiny stone fragment slightly  more proximally. Mild right hydronephrosis and hydroureter despite stent placement. 5. Additional nonobstructing stones in both kidneys. 6. Mild splenomegaly. 7. Mild anterior wedging of T1, T3, and T12 vertebral bodies, no adjacent soft tissue thickening or inflammation to suggest acuity. 8. Fluid/liquid stool in the right colon, can be seen with diarrheal illness. No colonic wall thickening or inflammation. Aortic Atherosclerosis (ICD10-I70.0). Electronically Signed   By: Keith Rake M.D.   On: 10/20/2020 23:10    ASSESSMENT: Pancytopenia.  PLAN:    Pancytopenia: Chronic and unchanged.  Patient's white blood cell count remains 0.5 which, again, is not too far off his baseline.  No intervention is needed.  Patient does not require Granix at this time.  Will consider bone marrow biopsy as an outpatient.   Anemia: Patient's baseline hemoglobin appears to be 8-9.  His has been stable for several days without transfusion.  Decreased hemoglobin possibly secondary to hematoma and on Eliquis.  He likely has poor bone marrow reserve as well.  Given patient's cardiac disease, recommend keeping hemoglobin greater than 8.0.  No further interventions are needed. Thrombocytopenia: Chronic and unchanged.  Mild. Atrial fibrillation: Eliquis appears to have been reinitiated.   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  Despite this, patient may require bone marrow biopsy as above. Disposition: Patient reports possible discharge today.  Will arrange follow-up in the [place] in 2 to 3 weeks at the end of July.  Appreciate consult, call with questions.   Lloyd Huger, MD   11/03/2020 1:48 PM

## 2020-11-03 NOTE — Progress Notes (Signed)
SUBJECTIVE: Patient doing well today. No cardiac complaints. Patient reports back pain is better and he slept better last night. Denies chest pain and shortness of breath. Patient hoping to be discharged to rehab facility soon.   Vitals:   11/02/20 2014 11/03/20 0504 11/03/20 0747 11/03/20 0747  BP: 123/72 112/68 (!) 117/51 (!) 117/51  Pulse: 74 78 75 71  Resp: 20 20 18 18   Temp: 98 F (36.7 C) 98 F (36.7 C) 98.4 F (36.9 C) 98.4 F (36.9 C)  TempSrc: Oral Oral Oral Oral  SpO2: 98% 96% 98% 98%  Weight:      Height:        Intake/Output Summary (Last 24 hours) at 11/03/2020 0829 Last data filed at 11/03/2020 9509 Gross per 24 hour  Intake 0 ml  Output 1300 ml  Net -1300 ml    LABS: Basic Metabolic Panel: Recent Labs    11/01/20 0522  NA 142  K 4.0  CL 110  CO2 24  GLUCOSE 95  BUN 9  CREATININE 0.87  CALCIUM 8.0*   Liver Function Tests: No results for input(s): AST, ALT, ALKPHOS, BILITOT, PROT, ALBUMIN in the last 72 hours. No results for input(s): LIPASE, AMYLASE in the last 72 hours. CBC: Recent Labs    11/01/20 0522 11/02/20 0441  WBC 0.6* 0.6*  NEUTROABS 0.2* 0.2*  HGB 8.3* 8.0*  HCT 25.1* 23.9*  MCV 105.9* 105.3*  PLT 146* 134*   Cardiac Enzymes: No results for input(s): CKTOTAL, CKMB, CKMBINDEX, TROPONINI in the last 72 hours. BNP: Invalid input(s): POCBNP D-Dimer: No results for input(s): DDIMER in the last 72 hours. Hemoglobin A1C: No results for input(s): HGBA1C in the last 72 hours. Fasting Lipid Panel: No results for input(s): CHOL, HDL, LDLCALC, TRIG, CHOLHDL, LDLDIRECT in the last 72 hours. Thyroid Function Tests: No results for input(s): TSH, T4TOTAL, T3FREE, THYROIDAB in the last 72 hours.  Invalid input(s): FREET3 Anemia Panel: No results for input(s): VITAMINB12, FOLATE, FERRITIN, TIBC, IRON, RETICCTPCT in the last 72 hours.   PHYSICAL EXAM General: Well developed, well nourished, in no acute distress HEENT:  Normocephalic and  atramatic Neck:  No JVD.  Lungs: Clear bilaterally to auscultation and percussion. Heart: HRRR . Normal S1 and S2 without gallops or murmurs.  Abdomen: Bowel sounds are positive, abdomen soft and non-tender  Msk:  Back normal, normal gait. Normal strength and tone for age. Extremities: No clubbing, cyanosis or edema.   Neuro: Alert and oriented X 3. Psych:  Good affect, responds appropriately  TELEMETRY: atrial fibrillation  ASSESSMENT AND PLAN: Patient denies chest pain and shortness of breath. Patient to continue to hold Eliquis for now. We will consider starting Eliquis on follow up in the office if hemoglobin is stable on discharge from the hospital.  Principal Problem:   Symptomatic anemia Active Problems:   Acquired hypothyroidism   Benign prostatic hyperplasia with urinary obstruction   Compression fracture of lumbar vertebra (HCC)   Crohn's disease of large intestine (HCC)   Deep vein thrombosis (DVT) (HCC)   Unspecified atrial fibrillation (HCC)   CVA (cerebral vascular accident) ([place])    Amber Scoggins, FNP-C 11/03/2020 8:29 AM

## 2020-11-04 MED ORDER — HYDROCODONE-ACETAMINOPHEN 5-325 MG PO TABS: 1.0000 | ORAL_TABLET | Freq: Four times a day (QID) | ORAL | 0 refills | Status: AC | PRN

## 2020-11-04 MED ORDER — APIXABAN 2.5 MG PO TABS: 2.5000 mg | ORAL_TABLET | Freq: Two times a day (BID) | ORAL | Status: DC

## 2020-11-04 NOTE — Discharge Summary (Signed)
Physician Discharge Summary  Gordon Farrell KNL:[id number] DOB: 1946-04-07 DOA: 10/26/2020  PCP: Danae Orleans, MD  Admit date: 10/26/2020 Discharge date: 11/04/2020  Admitted From: Home Discharge disposition: Home with home health and DME   Code Status: Full Code  Diet Recommendation: Regular diet  Discharge Diagnosis:   Principal Problem:   Symptomatic anemia Active Problems:   Acquired hypothyroidism   Benign prostatic hyperplasia with urinary obstruction   Compression fracture of lumbar vertebra (HCC)   Crohn's disease of large intestine ([place])   Deep vein thrombosis (DVT) ([place])   Unspecified atrial fibrillation ([place])   CVA (cerebral vascular accident) [facility])    Chief Complaint  Patient presents with   Fall    Brief narrative: Gordon Farrell is a 75 y.o. male with PMH significant for atrial fibrillation, history of DVT, on Eliquis, hyperlipidemia, hypothyroid, BPH, pancytopenia, MGUS, chronic right upper back pain. Patient presented to the ED on 6/23 with complaints of progressive generalized weakness, gait instability and fall   In the ED, he was noted to have stable vitals, low baseline hemoglobin  Admitted for further evaluation management Seen by PT.  We have recommended.  Pending insurance authorization.  Subjective: Patient was seen and examined this morning. Pleasant elderly Caucasian male.  Lying on bed.  Frustrated because of insurance denial for his placement.    Hospital course: Progressive generalized weakness -Multifactorial: MGUS, anemia, UTI, stroke, old-age -PT/OT eval obtained.  Rehab recommended.    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  Acute drop in hemoglobin this admission probably because of right groin hematoma related to cardiac cath. -2 units of PRBC transfusion 6/23 -Hemoglobin overall stable at this time. Recent Labs    10/21/20 1706 10/22/20 0140 10/31/20 0507 11/01/20 0522  11/02/20 0441 11/03/20 0551 11/04/20 0438  HGB  --    < > 8.4* 8.3* 8.0* 8.2* 7.9*  MCV  --    < > 106.8* 105.9* 105.3* 103.5* 102.7*  VITAMINB12 1,280*  --   --   --   --   --   --   TIBC 197*  --   --   --   --   --   --   IRON 41*  --   --   --   --   --   --    < > = values in this interval not displayed.  Right groin hematoma-POA -6/20, patient had cardiac cath. -6/23, on presentation, patient was noted to have right groin hematoma of size 7.2 cm.  Cardiology was consulted. -CT abdomen pelvis was obtained to rule out retroperitoneal hematoma. -Eliquis has been resumed at a lower dose of 2.5 mg twice daily.  Discussed with cardiologist Dr. Humphrey Rolls on 6/30.  -To follow-up with cardiology as an outpatient.  UTI, right pyelonephritis -6/24, CT abdomen pelvis showed stable mucosal edema in the right renal collecting system compatible with infection.   -Nonsignificant growth of Enterococcus faecalis and Klebsiella in urine. -Completed 5-day course of IV Rocephin on 6/29.   Acute small ischemic cortical infarct -MRI brain showed punctate 3 mm acute ischemic nonhemorrhagic cortical infarct involving the posterior left temporal occipital region.  Likely incidental, no focal neurologic deficits -Neurology consulted.  Eliquis resumed. -PT/OT eval obtained.  Home health PT recommended.   Paroxysmal atrial fibrillation -On Eliquis.     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 -Currently neutropenic, continue  neutropenic precautions -Follow-up with his rheumatologist at [facility] upon discharge  History of DVT -On Eliquis  Hypothyroidism -On levothyroxine  Depression/anxiety/insomnia -On sertraline, trazodone   Wound care:    Discharge Exam:   Vitals:   11/03/20 1607 11/03/20 1935 11/04/20 0338 11/04/20 0804  BP: (!) 112/57 (!) 108/52 112/62 118/64  Pulse: 76 77 81 75  Resp: 20 16 16 16    Temp: 98.1 F (36.7 C) 97.9 F (36.6 C) 97.6 F (36.4 C) 98.4 F (36.9 C)  TempSrc: Oral Oral Oral Oral  SpO2: 99% 97% 97% 99%  Weight:      Height:        Body mass index is 27.98 kg/m.  General exam: Pleasant elderly Caucasian male.  Not in distress Skin: No rashes, lesions or ulcers. HEENT: Atraumatic, normocephalic, no obvious bleeding Lungs: Clear to auscultation bilaterally CVS: Regular rate and rhythm, no murmur GI/Abd soft, nontender, nondistended, bowel sound present CNS: Alert, awake, oriented x3 Psychiatry: Mood appropriate Extremities: No pedal edema, no calf tenderness  Follow ups:   Discharge Instructions     Diet general   Complete by: As directed    Increase activity slowly   Complete by: As directed        Contact information for follow-up providers     Danae Orleans, MD Follow up.   Specialty: Internal Medicine Contact information: [place] [phone]              Contact information for after-discharge care     [facility] .   Service: Skilled Nursing Contact information: [address] [place] [phone]                     Recommendations for Outpatient Follow-Up:   Follow-up with PCP as an outpatient  Discharge Instructions:  Follow with Primary MD Danae Orleans, MD in 7 days   Get CBC/BMP checked in next visit within 1 week by PCP or SNF MD ( we routinely change or add medications that can affect your baseline labs and fluid status, therefore we recommend that you get the mentioned basic workup next visit with your PCP, your PCP may decide not to get them or add new tests based on their clinical decision)  On your next visit with your PCP, please Get Medicines reviewed and adjusted.  Please request your PCP  to go over all Hospital Tests and Procedure/Radiological results at the follow up,  please get all Hospital records sent to your Prim MD by signing hospital release before you go home.  Activity: As tolerated with Full fall precautions use walker/cane & assistance as needed  For Heart failure patients - Check your Weight same time everyday, if you gain over 2 pounds, or you develop in leg swelling, experience more shortness of breath or chest pain, call your Primary MD immediately. Follow Cardiac Low Salt Diet and 1.5 lit/day fluid restriction.  If you have smoked or chewed Tobacco in the last 2 yrs please stop smoking, stop any regular Alcohol  and or any Recreational drug use.  If you experience worsening of your admission symptoms, develop shortness of breath, life threatening emergency, suicidal or homicidal thoughts you must seek medical attention immediately by calling 911 or calling your MD immediately  if symptoms less severe.  You Must read complete instructions/literature along with all the possible adverse reactions/side effects for all the Medicines  you take and that have been prescribed to you. Take any new Medicines after you have completely understood and accpet all the possible adverse reactions/side effects.   Do not drive, operate heavy machinery, perform activities at heights, swimming or participation in water activities or provide baby sitting services if your were admitted for syncope or siezures until you have seen by Primary MD or a Neurologist and advised to do so again.  Do not drive when taking Pain medications.  Do not take more than prescribed Pain, Sleep and Anxiety Medications  Wear Seat belts while driving.   Please note You were cared for by a hospitalist during your hospital stay. If you have any questions about your discharge medications or the care you received while you were in the hospital after you are discharged, you can call the unit and asked to speak with the hospitalist on call if the hospitalist that took care of you is not available.  Once you are discharged, your primary care physician will handle any further medical issues. Please note that NO REFILLS for any discharge medications will be authorized once you are discharged, as it is imperative that you return to your primary care physician (or establish a relationship with a primary care physician if you do not have one) for your aftercare needs so that they can reassess your need for medications and monitor your lab values.    Time coordinating discharge: 35 minutes  Allergies as of 11/04/2020   No Known Allergies      Medication List     TAKE these medications    apixaban 2.5 MG Tabs tablet Commonly known as: ELIQUIS Take 1 tablet (2.5 mg total) by mouth 2 (two) times daily. What changed:  medication strength how much to take   atorvastatin 40 MG tablet Commonly known as: LIPITOR Take 40 mg by mouth daily.   finasteride 5 MG tablet Commonly known as: PROSCAR Take 5 mg by mouth daily.   gabapentin 100 MG capsule Commonly known as: NEURONTIN Take 100 mg by mouth 3 (three) times daily.   HYDROcodone-acetaminophen 5-325 MG tablet Commonly known as: NORCO/VICODIN Take 1 tablet by mouth every 6 (six) hours as needed for up to 5 days for moderate pain.   latanoprost 0.005 % ophthalmic solution Commonly known as: XALATAN Place 1 drop into both eyes at bedtime.   levothyroxine 100 MCG tablet Commonly known as: SYNTHROID Take 100 mcg by mouth daily.   metoprolol tartrate 25 MG tablet Commonly known as: LOPRESSOR Take 1 tablet (25 mg total) by mouth 2 (two) times daily.   pantoprazole 40 MG tablet Commonly known as: Protonix Take 1 tablet (40 mg total) by mouth daily.   pilocarpine 5 MG tablet Commonly known as: SALAGEN Take 5 mg by mouth in the morning, at noon, and at bedtime.   pyridoxine 100 MG tablet Commonly known as: B-6 Take 100 mg by mouth daily.   sertraline 100 MG tablet Commonly known as: ZOLOFT Take 100 mg by mouth daily.    timolol 0.5 % ophthalmic solution Commonly known as: TIMOPTIC Place 1 drop into the right eye 2 (two) times daily.   traZODone 100 MG tablet Commonly known as: DESYREL Take 100 mg by mouth at bedtime.               Durable Medical Equipment  (From admission, onward)           Start     Ordered   11/04/20 0802  For home  use only DME Walker rolling  Once       Question Answer Comment  Walker: With [place] Wheels   Patient needs a walker to treat with the following condition Impaired mobility      11/04/20 0802   11/03/20 1543  For home use only DME 3 n 1  Once        11/03/20 1542            The results of significant diagnostics from this hospitalization (including imaging, microbiology, ancillary and laboratory) are listed below for reference.    Procedures and Diagnostic Studies:   CT ABDOMEN PELVIS WO CONTRAST  Result Date: 10/27/2020 CLINICAL DATA:  Decreased hemoglobin. Taking Eliquis for atrial fibrillation. Clinical concern for retroperitoneal hematoma. Fell today. EXAM: CT ABDOMEN AND PELVIS WITHOUT CONTRAST TECHNIQUE: Multidetector CT imaging of the abdomen and pelvis was performed following the standard protocol without IV contrast. COMPARISON:  Yesterday. FINDINGS: Lower chest: The heart remains borderline enlarged. No significant change in small bilateral pleural effusions and bilateral lower lobe atelectasis. Stable old, healed right lower anterior rib fractures. Hepatobiliary: No focal liver abnormality is seen. Status post cholecystectomy. No biliary dilatation. Pancreas: Stable moderate diffuse pancreatic atrophy. Spleen: Stable borderline enlarged spleen. Adrenals/Urinary Tract: Stable mild-to-moderate dilatation of the right renal collecting system with a right ureteral stent in satisfactory position. Retained excreted contrast in the right renal collecting system with mild mucosal edema. Stable multiple bilateral renal calculi. Normal appearing adrenal  glands, left ureter and urinary bladder. Stomach/Bowel: Stomach is within normal limits. Appendix appears normal. No evidence of bowel wall thickening, distention, or inflammatory changes. Vascular/Lymphatic: No significant vascular findings are present. No enlarged abdominal or pelvic lymph nodes. Reproductive: Prostate gland grossly normal in size, partially obscured by artifacts from a right hip prosthesis. Other: Anterior abdominal wall surgical wires. Small bilateral inguinal hernias containing fat. Musculoskeletal: Right hip prosthesis with associated streak artifacts. Stable approximately 10% T12 superior endplate compression deformity and mild Schmorl's node formation. No acute fracture lines. Minimal bony retropulsion. IMPRESSION: 1. No retroperitoneal hemorrhage or other interval acute abnormality seen. 2. Stable small bilateral pleural effusions and bilateral lower lobe atelectasis. 3. Stable mild-to-moderate right hydronephrosis with a right ureteral stent in satisfactory position. 4. Stable mucosal edema involving the right renal collecting system, compatible with infection. 5. Stable bilateral renal calculi. Electronically Signed   By: Claudie Revering M.D.   On: 10/27/2020 16:29   CT Head Wo Contrast  Result Date: 10/26/2020 CLINICAL DATA:  Minor head trauma EXAM: CT HEAD WITHOUT CONTRAST CT CERVICAL SPINE WITHOUT CONTRAST TECHNIQUE: Multidetector CT imaging of the head and cervical spine was performed following the standard protocol without intravenous contrast. Multiplanar CT image reconstructions of the cervical spine were also generated. COMPARISON:  Brain MRI 06/15/2020 FINDINGS: CT HEAD FINDINGS Brain: No evidence of acute infarction, hemorrhage, hydrocephalus, extra-axial collection or mass lesion/mass effect. Generalized atrophy Vascular: No hyperdense vessel or unexpected calcification. Skull: Normal. Negative for fracture or focal lesion. Sinuses/Orbits: No acute finding. CT CERVICAL SPINE  FINDINGS Alignment: No traumatic malalignment Skull base and vertebrae: No acute fracture. Remote T1 spinous process fracture. Soft tissues and spinal canal: No prevertebral fluid or swelling. No visible canal hematoma. Disc levels: Ordinary disc and facet degeneration which is generalized. Upper chest: Reported separately IMPRESSION: No evidence of intracranial or cervical spine injury. Electronically Signed   By: Monte Fantasia M.D.   On: 10/26/2020 09:53   CT Cervical Spine Wo Contrast  Result Date: 10/26/2020 CLINICAL  DATA:  Minor head trauma EXAM: CT HEAD WITHOUT CONTRAST CT CERVICAL SPINE WITHOUT CONTRAST TECHNIQUE: Multidetector CT imaging of the head and cervical spine was performed following the standard protocol without intravenous contrast. Multiplanar CT image reconstructions of the cervical spine were also generated. COMPARISON:  Brain MRI 06/15/2020 FINDINGS: CT HEAD FINDINGS Brain: No evidence of acute infarction, hemorrhage, hydrocephalus, extra-axial collection or mass lesion/mass effect. Generalized atrophy Vascular: No hyperdense vessel or unexpected calcification. Skull: Normal. Negative for fracture or focal lesion. Sinuses/Orbits: No acute finding. CT CERVICAL SPINE FINDINGS Alignment: No traumatic malalignment Skull base and vertebrae: No acute fracture. Remote T1 spinous process fracture. Soft tissues and spinal canal: No prevertebral fluid or swelling. No visible canal hematoma. Disc levels: Ordinary disc and facet degeneration which is generalized. Upper chest: Reported separately IMPRESSION: No evidence of intracranial or cervical spine injury. Electronically Signed   By: Monte Fantasia M.D.   On: 10/26/2020 09:53   MR ANGIO HEAD WO CONTRAST  Result Date: 10/27/2020 CLINICAL DATA:  Stroke workup. EXAM: MRA NECK WITHOUT AND WITH CONTRAST MRA HEAD WITHOUT CONTRAST TECHNIQUE: Multiplanar and multiecho pulse sequences of the neck were obtained without and with intravenous contrast.  Angiographic images of the neck were obtained using MRA technique without and with intravenous contrast; Angiographic images of the Circle of Willis were obtained using MRA technique without intravenous contrast. CONTRAST:  7.82mL GADAVIST GADOBUTROL 1 MMOL/ML IV SOLN COMPARISON:  None. FINDINGS: MRA NECK FINDINGS Motion limited evaluation. Aorta: Great vessel origins are patent. Right carotid system: Patent without evidence of flow limiting stenosis. Left carotid system: Patent without evidence of flow limiting stenosis. Vertebral arteries: Left dominant. Patent without evidence of flow limiting stenosis. MRA HEAD FINDINGS Anterior circulation: Bilateral ICAs, MCAs, and ACAs are patent without proximal flow limiting stenosis. No aneurysm identified. Posterior circulation: Bilateral intradural vertebral arteries, basilar artery and posterior cerebral arteries are patent without proximal flow limiting stenosis. Prominent bilateral posterior communicating arteries. No aneurysm identified. IMPRESSION: No evidence of a large vessel occlusion or proximal flow limiting stenosis in the head or neck. Electronically Signed   By: Margaretha Sheffield MD   On: 10/27/2020 19:53   MR ANGIO NECK W WO CONTRAST  Result Date: 10/27/2020 CLINICAL DATA:  Stroke workup. EXAM: MRA NECK WITHOUT AND WITH CONTRAST MRA HEAD WITHOUT CONTRAST TECHNIQUE: Multiplanar and multiecho pulse sequences of the neck were obtained without and with intravenous contrast. Angiographic images of the neck were obtained using MRA technique without and with intravenous contrast; Angiographic images of the Circle of Willis were obtained using MRA technique without intravenous contrast. CONTRAST:  7.12mL GADAVIST GADOBUTROL 1 MMOL/ML IV SOLN COMPARISON:  None. FINDINGS: MRA NECK FINDINGS Motion limited evaluation. Aorta: Great vessel origins are patent. Right carotid system: Patent without evidence of flow limiting stenosis. Left carotid system: Patent without  evidence of flow limiting stenosis. Vertebral arteries: Left dominant. Patent without evidence of flow limiting stenosis. MRA HEAD FINDINGS Anterior circulation: Bilateral ICAs, MCAs, and ACAs are patent without proximal flow limiting stenosis. No aneurysm identified. Posterior circulation: Bilateral intradural vertebral arteries, basilar artery and posterior cerebral arteries are patent without proximal flow limiting stenosis. Prominent bilateral posterior communicating arteries. No aneurysm identified. IMPRESSION: No evidence of a large vessel occlusion or proximal flow limiting stenosis in the head or neck. Electronically Signed   By: Margaretha Sheffield MD   On: 10/27/2020 19:53   MR BRAIN WO CONTRAST  Result Date: 10/27/2020 CLINICAL DATA:  Initial evaluation for syncope, fall. EXAM: MRI HEAD  WITHOUT CONTRAST TECHNIQUE: Multiplanar, multiecho pulse sequences of the brain and surrounding structures were obtained without intravenous contrast. COMPARISON:  Prior CT from earlier the same day. FINDINGS: Brain: Diffuse prominence of the CSF containing spaces compatible generalized cerebral atrophy. Mild chronic microvascular ischemic disease noted involving the periventricular white matter. Punctate 3 mm focus of restricted diffusion seen involving the cortex of the posterior left temporal occipital region (series 5, image 22). Finding consistent with a small acute ischemic infarct. No blood products seen within this region on corresponding SWI sequence or prior CT. No mass effect. No other diffusion abnormality to suggest acute or subacute ischemia. Apparent vague diffusion signal overlying the frontal convexities on axial DWI sequence favored to be artifactual in nature. Gray-white matter differentiation maintained. No encephalomalacia to suggest chronic cortical infarction. No other evidence for acute or chronic intracranial hemorrhage. No mass lesion, midline shift or mass effect. No hydrocephalus or  extra-axial fluid collection. Partially empty sella noted. Suprasellar region normal. Midline structures intact. Vascular: Major intracranial vascular flow voids are maintained. Skull and upper cervical spine: Craniocervical junction within normal limits. Bone marrow signal intensity normal. Probable small contusion noted at the left parietal scalp. Sinuses/Orbits: Sequelae of prior bilateral ocular lens replacement. Globes and orbital soft tissues demonstrate no acute finding. Paranasal sinuses are clear. Small right mastoid effusion noted, of doubtful significance. Inner ear structures grossly normal. Visualized nasopharynx unremarkable. Other: None. IMPRESSION: 1. 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 2. No other acute intracranial abnormality. 3. Mild age-related cerebral atrophy with chronic small vessel ischemic disease. 4. Small left parietal scalp contusion. Electronically Signed   By: Jeannine Boga M.D.   On: 10/27/2020 00:39   MR Lumbar Spine W Wo Contrast  Result Date: 10/27/2020 CLINICAL DATA:  Initial evaluation for numbness and tingling, recent fall. Evaluate for stenosis. EXAM: MRI LUMBAR SPINE WITHOUT AND WITH CONTRAST TECHNIQUE: Multiplanar and multiecho pulse sequences of the lumbar spine were obtained without and with intravenous contrast. CONTRAST:  91mL GADAVIST GADOBUTROL 1 MMOL/ML IV SOLN COMPARISON:  Prior CT from 10/26/2020. FINDINGS: Segmentation: Standard. Lowest well-formed disc space labeled the L5-S1 level. Alignment: Trace retrolisthesis of L4 on L5, with 2 mm anterolisthesis of L5 on S1. Findings chronic and facet mediated. Alignment otherwise normal preservation of the normal lumbar lordosis. Vertebrae: Chronic T12 compression fracture, partially visualized. Vertebral body height otherwise maintained with no other acute or chronic fracture. Bone marrow signal intensity within normal limits. No  discrete or worrisome osseous lesions. No abnormal marrow edema or enhancement. Conus medullaris and cauda equina: Conus extends to the L1-2 level. Conus and cauda equina appear normal. Paraspinal and other soft tissues: Paraspinous soft tissues demonstrate no acute finding. Few scattered benign appearing cyst noted about the kidneys, better evaluated on prior CT. Visualized visceral structures otherwise unremarkable. Disc levels: L1-2: Negative interspace. Mild facet hypertrophy. No stenosis or impingement. L2-3: Mild anterior endplate spurring without significant disc bulge. Mild facet hypertrophy. No canal or foraminal stenosis. L3-4: Disc desiccation with mild annular disc bulge. Mild facet hypertrophy. No canal or foraminal stenosis. No impingement. L4-5: Mild disc bulge with disc desiccation. Moderate left worse than right facet hypertrophy. Superimposed tiny 4 mm cystic lesion noted at the left ligamentum flavum, which could reflect a small synovial cyst versus cystic ligamentous degeneration (series 8, image 29). Resultant mild narrowing of the lateral recesses bilaterally. Central canal remains patent. Mild left L4 foraminal narrowing. Right neural foramina remains patent. L5-S1: Trace  anterolisthesis. Degenerative intervertebral disc space narrowing with disc desiccation and mild disc bulge. Moderate bilateral facet hypertrophy. No canal or lateral recess stenosis. Mild right L5 foraminal narrowing. Left neural foramen remains patent. No frank impingement. IMPRESSION: 1. No evidence for acute injury related to recent fall. 2. Mild disc bulging with moderate facet hypertrophy at L4-5, resulting in mild bilateral lateral recess stenosis with mild left L4 foraminal narrowing. 3. Trace anterolisthesis of L5 on S1 with associated mild disc bulge and moderate facet hypertrophy, resulting in mild right L5 foraminal stenosis. No other significant stenosis or neural impingement within the lumbar spine. 4. Chronic  T12 compression fracture, partially visualized. Electronically Signed   By: Jeannine Boga M.D.   On: 10/27/2020 01:02   CT CHEST ABDOMEN PELVIS W CONTRAST  Result Date: 10/26/2020 CLINICAL DATA:  Fall in the setting of generalized weakness. Low back pain. EXAM: CT CHEST, ABDOMEN, AND PELVIS WITH CONTRAST TECHNIQUE: Multidetector CT imaging of the chest, abdomen and pelvis was performed following the standard protocol during bolus administration of intravenous contrast. CONTRAST:  116mL OMNIPAQUE IOHEXOL 300 MG/ML  SOLN COMPARISON:  None. FINDINGS: CT CHEST FINDINGS Cardiovascular: Normal heart size. No pericardial effusion. Coronary atherosclerosis. Mediastinum/Nodes: Negative for adenopathy or mass. Lungs/Pleura: Dependent atelectasis with small pleural effusions. No pulmonary edema or air leak. No hemothorax. Musculoskeletal: Negative for acute fracture or subluxation. Remote right anterior rib fractures. Remote T1 spinous process fracture. CT ABDOMEN PELVIS FINDINGS Hepatobiliary: No focal liver abnormality.Cholecystectomy. No bile duct dilatation Pancreas: Generalized atrophy. Spleen: No acute finding. Adrenals/Urinary Tract: Negative adrenals. Multiple renal calculi, more numerous on the left where there is generalized involvement and a dominant 1 cm lower pole stone. On the right the largest stone is at the upper pole and measures 8 mm. Right internal ureteral stent and located position. Mild urothelial thickening at the right ureter which is likely reactive. The bladder is partially obscured by hip prosthesis but unremarkable where seen. Stomach/Bowel:  No obstruction. No visible inflammation Vascular/Lymphatic: Atheromatous calcification of the aorta. Fat stranding around the right common femoral and superficial femoral arteries which is attributed to recent coronary angiography. No discrete hematoma or pseudoaneurysm. No mass or adenopathy. Reproductive:No acute finding. Other: No ascites or  pneumoperitoneum. Fatty enlargement of the inguinal canals. Musculoskeletal: Right hip arthroplasty. Multilevel bridging osteophytes in the lower thoracic and lumbar spine. IMPRESSION: 1. No posttraumatic finding. 2. Changes of right femoral access for cardiac catheterization. No hematoma. 3. Dependent atelectasis and trace pleural effusions with mild progression from CT 6 days ago. 4. Bilateral nephrolithiasis. Normally located right-sided ureteral stent. Electronically Signed   By: Monte Fantasia M.D.   On: 10/26/2020 09:49   [place] Lower Ext Art Right Ltd  Result Date: 10/26/2020 CLINICAL DATA:  Right groin pain and swelling. Recent heart catheterization. EXAM: RIGHT LOWER EXTREMITY ARTERIAL DUPLEX - LIMITED TECHNIQUE: Imaging was performed at the area of concern in the right groin. COMPARISON:  None. FINDINGS: Images were obtained over the right groin. Normal triphasic Doppler flow in the right common femoral artery. There is color Doppler flow in the adjacent common femoral vein. There is no evidence to suggest a pseudoaneurysm or AV fistula. There is a heterogeneous hypoechoic structure in the right groin that is compatible with hematoma that measures 6.2 x 4.5 x 7.2 cm. There is no definite arterial flow within this heterogeneous collection. IMPRESSION: 1. Right groin hematoma measuring up to 7.2 cm. 2. No evidence for a pseudoaneurysm or AV fistula in the right groin. Electronically  Signed   By: Markus Daft M.D.   On: 10/26/2020 11:45   CT L-SPINE NO CHARGE  Result Date: 10/26/2020 CLINICAL DATA:  Fall EXAM: CT Lumbar spine with contrast TECHNIQUE: Multiplanar CT images of the lumbar spine were reconstructed from contemporary CT of the abdomen and pelvis CONTRAST:  No additional COMPARISON:  Correlation made with CT abdomen October 20, 2020 FINDINGS: Segmentation: 5 lumbar type vertebrae. Alignment: Preserved. Vertebrae: Stable lumbar vertebral body heights. Chronic T12 compression fracture. There is no  acute fracture. Paraspinal and other soft tissues: Extra-spinal findings are better evaluated on concurrent dedicated imaging. There is no paraspinal hematoma. Right total hip arthroplasty with associated streak artifact. Disc levels: Mild multilevel degenerative changes are present, greatest at L5-S1. No high-grade osseous encroachment on the spinal canal. IMPRESSION: No acute lumbar spine fracture. Electronically Signed   By: Macy Mis M.D.   On: 10/26/2020 09:19   DG Chest Port 1 View  Result Date: 10/26/2020 CLINICAL DATA:  Shortness of breath fell in bathroom EXAM: PORTABLE CHEST 1 VIEW COMPARISON:  10/26/2020 FINDINGS: Clips over the right chest. Stable cardiomediastinal silhouette. Decreased vascular congestion compared to prior. Streaky atelectasis at the bases. No pneumothorax. IMPRESSION: Streaky atelectasis at the bases. Decreased vascular congestion compared to prior. Electronically Signed   By: Donavan Foil M.D.   On: 10/26/2020 15:41   DG Chest Port 1 View  Result Date: 10/26/2020 CLINICAL DATA:  Questionable sepsis. EXAM: PORTABLE CHEST 1 VIEW COMPARISON:  CT 10/20/2020.  Chest x-ray 10/20/2020. FINDINGS: Cardiomegaly with bilateral interstitial prominence and small bilateral effusions. Findings consistent with CHF. Surgical clips right upper quadrant. Degenerative change and scoliosis thoracic spine. IMPRESSION: Cardiomegaly with bilateral interstitial prominence and small bilateral pleural effusions. Findings consistent with CHF. Electronically Signed   By: Marcello Moores  Register   On: 10/26/2020 08:12   [place] RT LOWER EXTREM LTD SOFT TISSUE NON VASCULAR  Result Date: 10/26/2020 CLINICAL DATA:  Right groin hematoma EXAM: ULTRASOUND right LOWER EXTREMITY LIMITED TECHNIQUE: Ultrasound examination of the lower extremity soft tissues was performed in the area of clinical concern. COMPARISON:  10/27/2018 FINDINGS: Targeted ultrasound of the right groin performed in the region of previously  demonstrated groin hematoma. Heterogeneous hypo and hyperechoic mass in the right groin, this measures 5.9 x 5 x 7.2 cm, previous measurements of 6.2 x 4.5 x 7.2 cm. This is grossly stable in size IMPRESSION: Grossly stable 7.2 cm right groin hematoma. Electronically Signed   By: Donavan Foil M.D.   On: 10/26/2020 18:25     Labs:   Basic Metabolic Panel: Recent Labs  Lab 10/29/20 0618 10/30/20 0700 11/01/20 0522  NA 140 139 142  K 3.6 3.8 4.0  CL 113* 113* 110  CO2 21* 23 24  GLUCOSE 112* 97 95  BUN 14 11 9   CREATININE 0.92 0.76 0.87  CALCIUM 7.6* 7.7* 8.0*   GFR Estimated Creatinine Clearance: 77.2 mL/min (by C-G formula based on SCr of 0.87 mg/dL). Liver Function Tests: No results for input(s): AST, ALT, ALKPHOS, BILITOT, PROT, ALBUMIN in the last 168 hours. No results for input(s): LIPASE, AMYLASE in the last 168 hours. No results for input(s): AMMONIA in the last 168 hours. Coagulation profile No results for input(s): INR, PROTIME in the last 168 hours.  CBC: Recent Labs  Lab 10/31/20 0507 11/01/20 0522 11/02/20 0441 11/03/20 0551 11/04/20 0438  WBC 0.5* 0.6* 0.6* 0.5* 0.7*  NEUTROABS 0.2* 0.2* 0.2* 0.2* 0.2*  HGB 8.4* 8.3* 8.0* 8.2* 7.9*  HCT 25.0*  25.1* 23.9* 23.5* 22.6*  MCV 106.8* 105.9* 105.3* 103.5* 102.7*  PLT 162 146* 134* 141* 126*   Cardiac Enzymes: No results for input(s): CKTOTAL, CKMB, CKMBINDEX, TROPONINI in the last 168 hours. BNP: Invalid input(s): POCBNP CBG: No results for input(s): GLUCAP in the last 168 hours. D-Dimer No results for input(s): DDIMER in the last 72 hours. Hgb A1c No results for input(s): HGBA1C in the last 72 hours. Lipid Profile No results for input(s): CHOL, HDL, LDLCALC, TRIG, CHOLHDL, LDLDIRECT in the last 72 hours. Thyroid function studies No results for input(s): TSH, T4TOTAL, T3FREE, THYROIDAB in the last 72 hours.  Invalid input(s): FREET3 Anemia work up No results for input(s): VITAMINB12, FOLATE,  FERRITIN, TIBC, IRON, RETICCTPCT in the last 72 hours. Microbiology Recent Results (from the past 240 hour(s))  Resp Panel by RT-PCR (Flu A&B, Covid) Nasopharyngeal Swab     Status: None   Collection Time: 10/26/20  6:01 AM   Specimen: Nasopharyngeal Swab; Nasopharyngeal(NP) swabs in vial transport medium  Result Value Ref Range Status   SARS Coronavirus 2 by RT PCR NEGATIVE NEGATIVE Final    Comment: (NOTE) SARS-CoV-2 target nucleic acids are NOT DETECTED.  The SARS-CoV-2 RNA is generally detectable in upper respiratory specimens during the acute phase of infection. The lowest concentration of SARS-CoV-2 viral copies this assay can detect is 138 copies/mL. A negative result does not preclude SARS-Cov-2 infection and should not be used as the sole basis for treatment or other patient management decisions. A negative result may occur with  improper specimen collection/handling, submission of specimen other than nasopharyngeal swab, presence of viral mutation(s) within the areas targeted by this assay, and inadequate number of viral copies(<138 copies/mL). A negative result must be combined with clinical observations, patient history, and epidemiological information. The expected result is Negative.  Fact Sheet for Patients:  EntrepreneurPulse.com.au  Fact Sheet for Healthcare Providers:  IncredibleEmployment.be  This test is no t yet approved or cleared by the [place] FDA and  has been authorized for detection and/or diagnosis of SARS-CoV-2 by FDA under an Emergency Use Authorization (EUA). This EUA will remain  in effect (meaning this test can be used) for the duration of the COVID-19 declaration under Section 564(b)(1) of the Act, 21 U.S.C.section 360bbb-3(b)(1), unless the authorization is terminated  or revoked sooner.       Influenza A by PCR NEGATIVE NEGATIVE Final   Influenza B by PCR NEGATIVE NEGATIVE Final    Comment:  (NOTE) The Xpert Xpress SARS-CoV-2/FLU/RSV plus assay is intended as an aid in the diagnosis of influenza from Nasopharyngeal swab specimens and should not be used as a sole basis for treatment. Nasal washings and aspirates are unacceptable for Xpert Xpress SARS-CoV-2/FLU/RSV testing.  Fact Sheet for Patients: EntrepreneurPulse.com.au  Fact Sheet for Healthcare Providers: IncredibleEmployment.be  This test is not yet approved or cleared by the [place] FDA and has been authorized for detection and/or diagnosis of SARS-CoV-2 by FDA under an Emergency Use Authorization (EUA). This EUA will remain in effect (meaning this test can be used) for the duration of the COVID-19 declaration under Section 564(b)(1) of the Act, 21 U.S.C. section 360bbb-3(b)(1), unless the authorization is terminated or revoked.  Performed at [facility], [address] [place], [place] [address]   Blood culture (routine single)     Status: None   Collection Time: 10/26/20  8:11 AM   Specimen: BLOOD  Result Value Ref Range Status   Specimen Description BLOOD RIGHT [facility]  Final  Special Requests   Final    BOTTLES DRAWN AEROBIC AND ANAEROBIC Blood Culture adequate volume   Culture   Final    NO GROWTH 5 DAYS Performed at [facility], [address] [place], [place] [address]    Report Status 10/31/2020 FINAL  Final  Urine culture     Status: Abnormal   Collection Time: 10/26/20  8:11 AM   Specimen: In/Out Cath Urine  Result Value Ref Range Status   Specimen Description   Final    IN/OUT CATH URINE Performed at [facility], [address] [place], [place] [address]    Special Requests   Final    NONE Performed at [facility], [place], [place] [address]    Culture (A)  Final    6,000 COLONIES/mL KLEBSIELLA PNEUMONIAE 7,000 COLONIES/mL ENTEROCOCCUS FAECALIS    Report Status 10/28/2020 FINAL  Final    Organism ID, Bacteria KLEBSIELLA PNEUMONIAE (A)  Final   Organism ID, Bacteria ENTEROCOCCUS FAECALIS (A)  Final      Susceptibility   Enterococcus faecalis - MIC*    AMPICILLIN <=2 SENSITIVE Sensitive     NITROFURANTOIN <=16 SENSITIVE Sensitive     VANCOMYCIN 1 SENSITIVE Sensitive     * 7,000 COLONIES/mL ENTEROCOCCUS FAECALIS   Klebsiella pneumoniae - MIC*    AMPICILLIN >=32 RESISTANT Resistant     CEFAZOLIN <=4 SENSITIVE Sensitive     CEFEPIME <=0.12 SENSITIVE Sensitive     CEFTRIAXONE <=0.25 SENSITIVE Sensitive     CIPROFLOXACIN <=0.25 SENSITIVE Sensitive     GENTAMICIN <=1 SENSITIVE Sensitive     IMIPENEM <=0.25 SENSITIVE Sensitive     NITROFURANTOIN 64 INTERMEDIATE Intermediate     TRIMETH/SULFA >=320 RESISTANT Resistant     AMPICILLIN/SULBACTAM 4 SENSITIVE Sensitive     PIP/TAZO <=4 SENSITIVE Sensitive     * 6,000 COLONIES/mL KLEBSIELLA PNEUMONIAE     Signed: Aylissa Heinemann  [facility] 11/04/2020, 12:19 PM

## 2020-11-04 NOTE — Plan of Care (Signed)
Continuing with plan of care. 

## 2020-11-04 NOTE — Progress Notes (Signed)
Occupational Therapy Treatment Patient Details Name: Gordon Farrell MRN: [id number] DOB: Nov 28, 1945 Today's Date: 11/04/2020    History of present illness Pt is a 75 y/o M admitted on 10/26/20 after a fall. Pt being treated for symptomatic anemia, suspect 2/2 groin hematoma present on admission, suspect 2/2 to recent heart cath on 10/23/20. 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 PMH: a-fib, DVT, on eliquis, HLD, hypothyroid, BPH, pancytopenia, MGUS, chronic R upper back pain.   OT comments  Pt in bed on arrival, asking if he will be going home today and waiting on MD to come by, wife is at home and called during session.  Patient seen for OT treatment session with focus on self care tasks and functional mobility.  Pt performed bed mobility with modified independence, ambulation with RW to and from bathroom with supervision.  Able to perform toilet transfer with supervision, utilized grab bar by toilet and has grab bars at home.  Pt was delivered a 3 in one and instructed on how to adjust height settings once they are home.  Pt demonstrates understanding.  Patient able to manage lower body self care, toilet hygiene and grooming at the sink all from a supervision level this date.  Pt feels he is ready for discharge and would like to return home with wife today.  Continue OT services to maximize safety and independence in necessary daily activities moving towards level of independence prior to admission.     Follow Up Recommendations  Supervision/Assistance - 24 hour    Equipment Recommendations  3 in 1 bedside commode    Recommendations for Other Services      Precautions / Restrictions Precautions Precautions: Fall Restrictions Weight Bearing Restrictions: No       Mobility Bed Mobility Overal bed mobility: Modified Independent Bed Mobility: Supine to Sit;Sit to Supine       Sit to supine:  Modified independent (Device/Increase time);HOB elevated        Transfers   Equipment used: Rolling walker (2 wheeled) Transfers: Sit to/from Stand Sit to Stand: Supervision Stand pivot transfers: Supervision            Balance Overall balance assessment: Needs assistance Sitting-balance support: No upper extremity supported Sitting balance-Leahy Scale: Normal     Standing balance support: No upper extremity supported Standing balance-Leahy Scale: Good                             ADL either performed or assessed with clinical judgement   ADL Overall ADL's : Needs assistance/impaired Eating/Feeding: Independent   Grooming: Standing;Supervision/safety   Upper Body Bathing: Supervision/ safety   Lower Body Bathing: Supervison/ safety   Upper Body Dressing : Supervision/safety   Lower Body Dressing: Supervision/safety   Toilet Transfer: Supervision/safety   Toileting- Clothing Manipulation and Hygiene: Supervision/safety       Functional mobility during ADLs: Supervision/safety       Vision       Perception     Praxis      Cognition Arousal/Alertness: Awake/alert Behavior During Therapy: WFL for tasks assessed/performed Overall Cognitive Status: Within Functional Limits for tasks assessed                                 General Comments: A&O x 4  Exercises     Shoulder Instructions       General Comments      Pertinent Vitals/ Pain       Pain Assessment: No/denies pain Pain Score: 0-No pain  Home Living                                          Prior Functioning/Environment              Frequency  Min 2X/week        Progress Toward Goals  OT Goals(current goals can now be found in the care plan section)  Progress towards OT goals: Progressing toward goals  Acute Rehab OT Goals Patient Stated Goal: decrease pain OT Goal Formulation: With patient Time For Goal  Achievement: 11/10/20 Potential to Achieve Goals: Good  Plan Discharge plan remains appropriate;Frequency remains appropriate    Co-evaluation                 AM-PAC OT "6 Clicks" Daily Activity     Outcome Measure   Help from another person eating meals?: None Help from another person taking care of personal grooming?: None Help from another person toileting, which includes using toliet, bedpan, or urinal?: A Little Help from another person bathing (including washing, rinsing, drying)?: A Little Help from another person to put on and taking off regular upper body clothing?: None Help from another person to put on and taking off regular lower body clothing?: A Little 6 Click Score: 21    End of Session Equipment Utilized During Treatment: Rolling walker  OT Visit Diagnosis: Unsteadiness on feet (R26.81);History of falling (Z91.81);Other abnormalities of gait and mobility (R26.89);Muscle weakness (generalized) (M62.81);Pain   Activity Tolerance Patient tolerated treatment well   Patient Left in bed;with call bell/phone within reach;with bed alarm set   Nurse Communication          Time: 1005-1030 OT Time Calculation (min): 25 min  Charges: OT General Charges $OT Visit: 1 Visit OT Treatments $Self Care/Home Management : 23-37 mins  Gordon Farrell, OTR/L, CLT    Gordon Farrell 11/04/2020, 10:40 AM

## 2020-11-04 NOTE — TOC Transition Note (Signed)
Transition of Care [facility]) - CM/SW Discharge Note   Patient Details  Name: Gordon Farrell MRN: [id number] Date of Birth: October 25, 1945  [facility]) CM/SW Contact:  Magnus Ivan, LCSW Phone Number: 11/04/2020, 10:05 AM   Clinical Narrative:   Patient to discharge home today. Call from patient's wife who states she will have her neighbor bring her to pick up patient when he is ready for discharge. Informed Jason with [facility] of discharge - they will follow for PT, OT, RN, SW, and Aide services. [facility] delivered 3in1 and RW to bedside yesterday. No additional TOC needs.     Final next level of care: Power Barriers to Discharge: Barriers Resolved   Patient Goals and CMS Choice Patient states their goals for this hospitalization and ongoing recovery are:: home with home health CMS Medicare.gov Compare Post Acute Care list provided to:: Patient Choice offered to / list presented to : Patient, Spouse  Discharge Placement                Patient to be transferred to facility by: neighbor Name of family member notified: Silva Bandy - spouse Patient and family notified of of transfer: 11/04/20  Discharge Plan and Services     Post Acute Care Choice: [place]          DME Arranged: 3-N-1, Walker rolling DME Agency: [facility]       HH Arranged: PT, OT, RN, Nurse's Aide, Social Work [employer] Agency: [place] ([place]) Date [place]: 11/04/20   Representative spoke with at [place]: [place]) Interventions     Readmission Risk Interventions No flowsheet data found.

## 2020-11-04 NOTE — Plan of Care (Signed)
Discharge teaching completed with patient and patient's wife, patient is in stable condition.

## 2020-11-05 LAB — CBC WITH DIFFERENTIAL/PLATELET
Abs Immature Granulocytes: 0 10*3/uL (ref 0.00–0.07)
Basophils Absolute: 0 10*3/uL (ref 0.0–0.1)
Basophils Relative: 0 %
Eosinophils Absolute: 0 10*3/uL (ref 0.0–0.5)
Eosinophils Relative: 0 %
HCT: 22.6 % — ABNORMAL LOW (ref 39.0–52.0)
Hemoglobin: 7.9 g/dL — ABNORMAL LOW (ref 13.0–17.0)
Immature Granulocytes: 0 %
Lymphocytes Relative: 65 %
Lymphs Abs: 0.4 10*3/uL — ABNORMAL LOW (ref 0.7–4.0)
MCH: 35.9 pg — ABNORMAL HIGH (ref 26.0–34.0)
MCHC: 35 g/dL (ref 30.0–36.0)
MCV: 102.7 fL — ABNORMAL HIGH (ref 80.0–100.0)
Monocytes Absolute: 0 10*3/uL — ABNORMAL LOW (ref 0.1–1.0)
Monocytes Relative: 5 %
Neutro Abs: 0.2 10*3/uL — CL (ref 1.7–7.7)
Neutrophils Relative %: 30 %
Platelets: 126 10*3/uL — ABNORMAL LOW (ref 150–400)
RBC: 2.2 MIL/uL — ABNORMAL LOW (ref 4.22–5.81)
RDW: 19.3 % — ABNORMAL HIGH (ref 11.5–15.5)
WBC: 0.7 10*3/uL — CL (ref 4.0–10.5)
nRBC: 3 % — ABNORMAL HIGH (ref 0.0–0.2)

## 2020-11-07 ENCOUNTER — Telehealth: Payer: Self-pay

## 2020-11-07 NOTE — Telephone Encounter (Signed)
Received call from wife stating they had ran into issues with home health coming to see patient and with a medication not being at the pharmacy.    Home Health - stated someone had told them they would be there at a certain time during the weekend though no one came however someone came at a later time.  Per chart, patient set up for PT, OT, RN, SW, and Aide services through [facility].  Encouraged wife to follow up with [facility] regarding scheduling times each service will see patient.  Confirmed she has the number for [facility].   Medication - stated a change was made to patient's Eliquis from 5mg  to 2.5mg  upon discharge though [facility] on [address] did not have the prescription. Per chart, prescription for Eliquis 2.5mg  sent to [facility]. This Probation officer confirmed with [place] that they did not receive the RX.  Reached out to Dr. Pietro Cassis via secure chat.  Advised new RX was not sent as they could break the pills they already have in half (5mg ). Relayed this information to wife who expressed understanding.

## 2020-11-14 DIAGNOSIS — R399 Unspecified symptoms and signs involving the genitourinary system: Principal | ICD-10-CM

## 2020-11-16 ENCOUNTER — Ambulatory Visit: Admit: 2020-11-16 | Discharge: 2020-11-17 | Payer: MEDICARE

## 2020-11-16 DIAGNOSIS — I4891 Unspecified atrial fibrillation: Principal | ICD-10-CM

## 2020-11-16 DIAGNOSIS — I509 Heart failure, unspecified: Principal | ICD-10-CM

## 2020-11-16 DIAGNOSIS — R5383 Other fatigue: Principal | ICD-10-CM

## 2020-11-16 DIAGNOSIS — R7989 Other specified abnormal findings of blood chemistry: Principal | ICD-10-CM

## 2020-11-16 DIAGNOSIS — R310 Gross hematuria: Principal | ICD-10-CM

## 2020-11-16 DIAGNOSIS — D649 Anemia, unspecified: Principal | ICD-10-CM

## 2020-11-16 DIAGNOSIS — R399 Unspecified symptoms and signs involving the genitourinary system: Principal | ICD-10-CM

## 2020-11-16 DIAGNOSIS — R609 Edema, unspecified: Principal | ICD-10-CM

## 2020-11-16 DIAGNOSIS — D472 Monoclonal gammopathy: Principal | ICD-10-CM

## 2020-11-16 MED ORDER — METOPROLOL TARTRATE 25 MG TABLET
ORAL_TABLET | Freq: Every day | ORAL | 1 refills | 90 days | Status: CP
Start: 2020-11-16 — End: ?

## 2020-11-16 MED ORDER — PANTOPRAZOLE 40 MG TABLET,DELAYED RELEASE
ORAL_TABLET | Freq: Every day | ORAL | 1 refills | 90 days | Status: CP
Start: 2020-11-16 — End: 2021-11-16

## 2020-11-16 MED ORDER — FUROSEMIDE 20 MG TABLET
ORAL_TABLET | Freq: Every day | ORAL | 0 refills | 60 days | Status: CP | PRN
Start: 2020-11-16 — End: 2021-11-16

## 2020-11-23 DIAGNOSIS — R682 Dry mouth, unspecified: Principal | ICD-10-CM

## 2020-11-23 MED ORDER — BIOTIN 0.5 MG/ML ORAL SOLUTION
Freq: Three times a day (TID) | ORAL | 0 refills | 10.00000 days | Status: CP
Start: 2020-11-23 — End: 2020-11-23

## 2020-11-23 MED ORDER — PILOCARPINE 5 MG TABLET
ORAL_TABLET | Freq: Three times a day (TID) | ORAL | 11 refills | 30.00000 days | Status: CP
Start: 2020-11-23 — End: 2021-11-23

## 2020-11-27 DIAGNOSIS — D61818 Other pancytopenia: Secondary | ICD-10-CM | POA: Insufficient documentation

## 2020-11-27 NOTE — Progress Notes (Signed)
[facility]  [phone] [phone]  ID: Gordon Farrell OB: 17-Jun-1945  MR#: [id number]  VJK#:[id number]  Patient Care Team: Danae Orleans, MD as PCP - General (Internal Medicine)  CHIEF COMPLAINT: Pancytopenia.  INTERVAL HISTORY: Patient is a 75 year old male with a longstanding history of a stable pancytopenia of unclear etiology as initially seen as a hospital consult approximately 1 month ago.  He returns to clinic today for repeat laboratory work and post hospital follow-up.  He continues to have chronic weakness and fatigue, but feels significantly improved since discharge and nearly back to his baseline.  He has multiple medical complaints that are chronic and unchanged in nature.  He has no neurologic complaints.  He denies any recent fevers.  He has good appetite and his weight has remained stable.  He denies any chest pain,, cough, or hemoptysis, but does admit to shortness of breath and dyspnea on exertion.  He denies any nausea, vomiting, constipation, or diarrhea.  He has no melena or hematochezia.  He has no urinary complaints.  Patient offers no further specific complaints today.  REVIEW OF SYSTEMS:   Review of Systems  Constitutional:  Positive for malaise/fatigue. Negative for fever and weight loss.  Respiratory:  Positive for shortness of breath. Negative for cough and hemoptysis.   Cardiovascular: Negative.  Negative for chest pain and leg swelling.  Gastrointestinal:  Negative for abdominal pain, blood in stool and melena.  Genitourinary: Negative.  Negative for dysuria.  Musculoskeletal: Negative.  Negative for back pain.  Skin: Negative.  Negative for rash.  Neurological:  Positive for weakness. Negative for dizziness, focal weakness and headaches.  Psychiatric/Behavioral: Negative.  The patient is not nervous/anxious.    As per HPI. Otherwise, a complete review of systems is negative.  PAST MEDICAL HISTORY: Past Medical  History:  Diagnosis Date   Anemia    Dysrhythmia    Hypothyroidism     PAST SURGICAL HISTORY: Past Surgical History:  Procedure Laterality Date   BACK SURGERY     CHOLECYSTECTOMY     COLON RESECTION     multiple times   LEFT HEART CATH AND CORONARY ANGIOGRAPHY N/A 10/23/2020   Procedure: LEFT HEART CATH AND CORONARY ANGIOGRAPHY with coronary intervention;  Surgeon: Dionisio David, MD;  Location: [facility];  Service: Cardiovascular;  Laterality: N/A;   TOTAL HIP ARTHROPLASTY  05/06/2010    FAMILY HISTORY: Family History  Problem Relation Age of Onset   Osteoporosis Mother     ADVANCED DIRECTIVES (Y/N):  N  HEALTH MAINTENANCE: Social History   Tobacco Use   Smoking status: Never   Smokeless tobacco: Never  Vaping Use   Vaping Use: Never used  Substance Use Topics   Alcohol use: No    Alcohol/week: 0.0 standard drinks   Drug use: Never     Colonoscopy:  PAP:  Bone density:  Lipid panel:  No Known Allergies  Current Outpatient Medications  Medication Sig Dispense Refill   apixaban (ELIQUIS) 2.5 MG TABS tablet Take 1 tablet (2.5 mg total) by mouth 2 (two) times daily. 60 tablet    atorvastatin (LIPITOR) 40 MG tablet Take 40 mg by mouth daily.     finasteride (PROSCAR) 5 MG tablet Take 5 mg by mouth daily.     gabapentin (NEURONTIN) 100 MG capsule Take 100 mg by mouth 3 (three) times daily.     latanoprost (XALATAN) 0.005 % ophthalmic solution Place 1 drop into both eyes at bedtime.  levothyroxine (SYNTHROID) 100 MCG tablet Take 100 mcg by mouth daily.     metoprolol tartrate (LOPRESSOR) 25 MG tablet Take 1 tablet (25 mg total) by mouth 2 (two) times daily. 60 tablet 0   pilocarpine (SALAGEN) 5 MG tablet Take 5 mg by mouth in the morning, at noon, and at bedtime.     pyridoxine (B-6) 100 MG tablet Take 100 mg by mouth daily.     sertraline (ZOLOFT) 100 MG tablet Take 100 mg by mouth daily.     timolol (TIMOPTIC) 0.5 % ophthalmic solution Place 1  drop into the right eye 2 (two) times daily.     traZODone (DESYREL) 100 MG tablet Take 100 mg by mouth at bedtime.     pantoprazole (PROTONIX) 40 MG tablet Take 1 tablet (40 mg total) by mouth daily. 30 tablet 0   No current facility-administered medications for this visit.    OBJECTIVE: Vitals:   11/30/20 1058  BP: (!) 108/58  Pulse: 81  Resp: 20  Temp: (!) 97 F (36.1 C)  SpO2: 100%     Body mass index is 27.55 kg/m.    ECOG FS:1 - Symptomatic but completely ambulatory  General: Well-developed, well-nourished, no acute distress. Eyes: Pink conjunctiva, anicteric sclera. HEENT: Normocephalic, moist mucous membranes. Lungs: No audible wheezing or coughing. Heart: Regular rate and rhythm. Abdomen: Soft, nontender, no obvious distention. Musculoskeletal: No edema, cyanosis, or clubbing. Neuro: Alert, answering all questions appropriately. Cranial nerves grossly intact. Skin: No rashes or petechiae noted. Psych: Normal affect. Lymphatics: No cervical, calvicular, axillary or inguinal LAD.   LAB RESULTS:  Lab Results  Component Value Date   NA 142 11/01/2020   K 4.0 11/01/2020   CL 110 11/01/2020   CO2 24 11/01/2020   GLUCOSE 95 11/01/2020   BUN 9 11/01/2020   CREATININE 0.87 11/01/2020   CALCIUM 8.0 (L) 11/01/2020   PROT 5.9 (L) 10/26/2020   ALBUMIN 2.6 (L) 10/26/2020   AST 28 10/26/2020   ALT 19 10/26/2020   ALKPHOS 73 10/26/2020   BILITOT 1.3 (H) 10/26/2020   GFRNONAA >60 11/01/2020    Lab Results  Component Value Date   WBC 0.8 (LL) 11/30/2020   NEUTROABS 0.3 (LL) 11/30/2020   HGB 7.2 (L) 11/30/2020   HCT 21.9 (L) 11/30/2020   MCV 114.7 (H) 11/30/2020   PLT 127 (L) 11/30/2020     STUDIES: No results found.  ASSESSMENT: Pancytopenia.  PLAN:    Pancytopenia: Chronic and unchanged.  Patient's total white blood cell count is 0.8 today which is approximately his baseline.  We discussed the possibility of repeat bone marrow biopsy, but patient does  not wish to pursue this at this time and plans to [facility] follow[facility]up at [facility].  No follow-up has been scheduled at this time.   Anemia: Patient's baseline hemoglobin appears to be 8-9.  His hemoglobin has declined to 7.2 and we discussed returning to clinic tomorrow for blood transfusion.  Patient has declined this option.  Previously recommended keeping hemoglobin greater than 8.0 given his underlying cardiac disease.   Thrombocytopenia: Chronic and unchanged.  Patient's platelet count is 127 which is approximately his baseline. Atrial fibrillation: Continue Eliquis as prescribed.  Follow-up with cardiology as scheduled. 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   Disposition: Per patient request, no further follow-up has been scheduled.  Continue follow-up closely with his physicians at [facility].  Patient lives in [place].  If  he ever wanted to transfer care, have recommended he follow-up at the [facility].  Patient expressed understanding and was in agreement with this plan. He also understands that He can call clinic at any time with any questions, concerns, or complaints.    Lloyd Huger, MD   11/30/2020 1:04 PM

## 2020-11-29 ENCOUNTER — Other Ambulatory Visit: Payer: Self-pay

## 2020-11-29 DIAGNOSIS — D61818 Other pancytopenia: Secondary | ICD-10-CM

## 2020-11-30 ENCOUNTER — Encounter (INDEPENDENT_AMBULATORY_CARE_PROVIDER_SITE_OTHER): Payer: Self-pay

## 2020-11-30 ENCOUNTER — Encounter: Payer: Self-pay | Admitting: Oncology

## 2020-11-30 ENCOUNTER — Inpatient Hospital Stay: Payer: Medicare Other | Admitting: Oncology

## 2020-11-30 ENCOUNTER — Inpatient Hospital Stay: Payer: Medicare Other | Attending: Oncology

## 2020-11-30 DIAGNOSIS — D472 Monoclonal gammopathy: Secondary | ICD-10-CM | POA: Diagnosis not present

## 2020-11-30 DIAGNOSIS — D61818 Other pancytopenia: Secondary | ICD-10-CM | POA: Diagnosis present

## 2020-11-30 DIAGNOSIS — Z7901 Long term (current) use of anticoagulants: Secondary | ICD-10-CM | POA: Insufficient documentation

## 2020-11-30 DIAGNOSIS — I4891 Unspecified atrial fibrillation: Secondary | ICD-10-CM | POA: Insufficient documentation

## 2020-11-30 DIAGNOSIS — Z79899 Other long term (current) drug therapy: Secondary | ICD-10-CM | POA: Insufficient documentation

## 2020-11-30 LAB — CBC WITH DIFFERENTIAL/PLATELET
Abs Immature Granulocytes: 0.01 10*3/uL (ref 0.00–0.07)
Basophils Absolute: 0 10*3/uL (ref 0.0–0.1)
Basophils Relative: 0 %
Eosinophils Absolute: 0 10*3/uL (ref 0.0–0.5)
Eosinophils Relative: 1 %
HCT: 21.9 % — ABNORMAL LOW (ref 39.0–52.0)
Hemoglobin: 7.2 g/dL — ABNORMAL LOW (ref 13.0–17.0)
Immature Granulocytes: 1 %
Lymphocytes Relative: 55 %
Lymphs Abs: 0.4 10*3/uL — ABNORMAL LOW (ref 0.7–4.0)
MCH: 37.7 pg — ABNORMAL HIGH (ref 26.0–34.0)
MCHC: 32.9 g/dL (ref 30.0–36.0)
MCV: 114.7 fL — ABNORMAL HIGH (ref 80.0–100.0)
Monocytes Absolute: 0.1 10*3/uL (ref 0.1–1.0)
Monocytes Relative: 9 %
Neutro Abs: 0.3 10*3/uL — CL (ref 1.7–7.7)
Neutrophils Relative %: 34 %
Platelets: 127 10*3/uL — ABNORMAL LOW (ref 150–400)
RBC: 1.91 MIL/uL — ABNORMAL LOW (ref 4.22–5.81)
RDW: 24.1 % — ABNORMAL HIGH (ref 11.5–15.5)
Smear Review: NORMAL
WBC: 0.8 10*3/uL — CL (ref 4.0–10.5)
nRBC: 6.5 % — ABNORMAL HIGH (ref 0.0–0.2)

## 2020-12-07 DIAGNOSIS — F32A Anxiety and depression: Principal | ICD-10-CM

## 2020-12-07 DIAGNOSIS — G4700 Insomnia, unspecified: Principal | ICD-10-CM

## 2020-12-07 DIAGNOSIS — F419 Anxiety disorder, unspecified: Principal | ICD-10-CM

## 2020-12-07 MED ORDER — TRAZODONE 100 MG TABLET
ORAL_TABLET | Freq: Every evening | ORAL | 3 refills | 90 days | Status: CP
Start: 2020-12-07 — End: ?

## 2020-12-08 DIAGNOSIS — N200 Calculus of kidney: Principal | ICD-10-CM

## 2020-12-14 ENCOUNTER — Ambulatory Visit: Admit: 2020-12-14 | Discharge: 2020-12-15 | Payer: MEDICARE

## 2020-12-14 DIAGNOSIS — R399 Unspecified symptoms and signs involving the genitourinary system: Principal | ICD-10-CM

## 2020-12-14 DIAGNOSIS — N320 Bladder-neck obstruction: Principal | ICD-10-CM

## 2020-12-14 DIAGNOSIS — D472 Monoclonal gammopathy: Principal | ICD-10-CM

## 2020-12-14 DIAGNOSIS — K50119 Crohn's disease of large intestine with unspecified complications: Principal | ICD-10-CM

## 2020-12-14 DIAGNOSIS — E039 Hypothyroidism, unspecified: Principal | ICD-10-CM

## 2020-12-14 DIAGNOSIS — F321 Major depressive disorder, single episode, moderate: Principal | ICD-10-CM

## 2020-12-14 DIAGNOSIS — I2699 Other pulmonary embolism without acute cor pulmonale: Principal | ICD-10-CM

## 2020-12-14 DIAGNOSIS — D696 Thrombocytopenia, unspecified: Principal | ICD-10-CM

## 2020-12-14 DIAGNOSIS — N138 Other obstructive and reflux uropathy: Principal | ICD-10-CM

## 2020-12-14 DIAGNOSIS — Z8679 Personal history of other diseases of the circulatory system: Principal | ICD-10-CM

## 2020-12-14 DIAGNOSIS — E785 Hyperlipidemia, unspecified: Principal | ICD-10-CM

## 2020-12-14 DIAGNOSIS — D649 Anemia, unspecified: Principal | ICD-10-CM

## 2020-12-14 DIAGNOSIS — R310 Gross hematuria: Principal | ICD-10-CM

## 2020-12-14 DIAGNOSIS — E8809 Other disorders of plasma-protein metabolism, not elsewhere classified: Principal | ICD-10-CM

## 2020-12-14 DIAGNOSIS — I509 Heart failure, unspecified: Principal | ICD-10-CM

## 2020-12-14 DIAGNOSIS — G4700 Insomnia, unspecified: Principal | ICD-10-CM

## 2020-12-14 DIAGNOSIS — N401 Enlarged prostate with lower urinary tract symptoms: Principal | ICD-10-CM

## 2020-12-14 DIAGNOSIS — R682 Dry mouth, unspecified: Principal | ICD-10-CM

## 2020-12-14 DIAGNOSIS — I4891 Unspecified atrial fibrillation: Principal | ICD-10-CM

## 2020-12-14 MED ORDER — GABAPENTIN 100 MG CAPSULE
ORAL_CAPSULE | 3 refills | 0 days | Status: CP
Start: 2020-12-14 — End: 2021-12-14

## 2020-12-14 MED ORDER — DUKE'S MAGIC MOUTHWASH ORAL SUSPENSION
Freq: Four times a day (QID) | ORAL | 0 refills | 6 days | Status: CP
Start: 2020-12-14 — End: ?

## 2020-12-18 ENCOUNTER — Ambulatory Visit
Admit: 2020-12-18 | Discharge: 2020-12-18 | Payer: MEDICARE | Attending: Student in an Organized Health Care Education/Training Program | Primary: Student in an Organized Health Care Education/Training Program

## 2020-12-18 DIAGNOSIS — I4891 Unspecified atrial fibrillation: Principal | ICD-10-CM

## 2020-12-18 DIAGNOSIS — N200 Calculus of kidney: Principal | ICD-10-CM

## 2020-12-18 MED ORDER — METOPROLOL SUCCINATE ER 25 MG TABLET,EXTENDED RELEASE 24 HR
ORAL_TABLET | Freq: Every day | ORAL | 3 refills | 90 days | Status: CP
Start: 2020-12-18 — End: ?

## 2020-12-22 DIAGNOSIS — R399 Unspecified symptoms and signs involving the genitourinary system: Principal | ICD-10-CM

## 2020-12-28 ENCOUNTER — Ambulatory Visit: Admit: 2020-12-28 | Discharge: 2020-12-29 | Payer: MEDICARE

## 2020-12-28 DIAGNOSIS — R208 Other disturbances of skin sensation: Principal | ICD-10-CM

## 2020-12-28 DIAGNOSIS — R21 Rash and other nonspecific skin eruption: Principal | ICD-10-CM

## 2020-12-28 MED ORDER — HYDROCORTISONE 2.5 % TOPICAL CREAM
TOPICAL | 3 refills | 0.00000 days | Status: CP
Start: 2020-12-28 — End: ?

## 2021-01-01 ENCOUNTER — Other Ambulatory Visit: Admit: 2021-01-01 | Discharge: 2021-01-02 | Payer: MEDICARE

## 2021-01-01 DIAGNOSIS — R399 Unspecified symptoms and signs involving the genitourinary system: Principal | ICD-10-CM

## 2021-01-09 ENCOUNTER — Ambulatory Visit: Payer: Medicare Other | Admitting: Gastroenterology

## 2021-01-10 ENCOUNTER — Ambulatory Visit: Admit: 2021-01-10 | Discharge: 2021-01-10 | Payer: MEDICARE

## 2021-01-10 DIAGNOSIS — R310 Gross hematuria: Principal | ICD-10-CM

## 2021-01-11 ENCOUNTER — Ambulatory Visit: Admit: 2021-01-11 | Discharge: 2021-01-12 | Payer: MEDICARE

## 2021-01-11 DIAGNOSIS — G603 Idiopathic progressive neuropathy: Principal | ICD-10-CM

## 2021-01-12 ENCOUNTER — Encounter: Admit: 2021-01-12 | Discharge: 2021-01-19 | Payer: MEDICARE

## 2021-01-12 ENCOUNTER — Ambulatory Visit
Admit: 2021-01-12 | Discharge: 2021-01-19 | Disposition: A | Discharge status: Home Health | Payer: MEDICARE | Admitting: Internal Medicine

## 2021-01-12 ENCOUNTER — Ambulatory Visit: Admit: 2021-01-12 | Payer: MEDICARE

## 2021-01-12 ENCOUNTER — Ambulatory Visit: Admit: 2021-01-12 | Discharge: 2021-01-19 | Payer: MEDICARE

## 2021-01-12 ENCOUNTER — Ambulatory Visit: Admit: 2021-01-12 | Discharge: 2021-01-13 | Payer: MEDICARE

## 2021-01-12 ENCOUNTER — Encounter
Admit: 2021-01-12 | Discharge: 2021-01-19 | Disposition: A | Discharge status: Home Health | Payer: MEDICARE | Attending: Medical | Admitting: Internal Medicine

## 2021-01-19 DIAGNOSIS — C9200 Acute myeloblastic leukemia, not having achieved remission: Principal | ICD-10-CM

## 2021-01-19 MED ORDER — LIDOCAINE HCL 2 % MUCOSAL SOLUTION
OROMUCOSAL | 0 refills | 1 days | Status: CP | PRN
Start: 2021-01-19 — End: 2021-02-18
  Filled 2021-01-19: qty 100, 1d supply, fill #0

## 2021-01-19 MED ORDER — POSACONAZOLE 100 MG TABLET,DELAYED RELEASE
ORAL_TABLET | Freq: Every day | ORAL | 0 refills | 30 days | Status: CP
Start: 2021-01-19 — End: ?

## 2021-01-19 MED ORDER — VENETOCLAX 100 MG TABLET
ORAL_TABLET | Freq: Every day | ORAL | 6 refills | 30 days | Status: CP
Start: 2021-01-19 — End: ?
  Filled 2021-01-29: qty 120, 30d supply, fill #0

## 2021-01-20 MED ORDER — LEVOFLOXACIN 500 MG TABLET
ORAL_TABLET | ORAL | 0 refills | 30 days | Status: CP
Start: 2021-01-20 — End: 2021-02-19
  Filled 2021-01-19: qty 30, 30d supply, fill #0

## 2021-01-20 MED ORDER — VALACYCLOVIR 500 MG TABLET
ORAL_TABLET | Freq: Every day | ORAL | 0 refills | 30 days | Status: CP
Start: 2021-01-20 — End: 2021-02-19
  Filled 2021-01-19: qty 30, 30d supply, fill #0

## 2021-01-20 MED ORDER — ALLOPURINOL 300 MG TABLET
ORAL_TABLET | Freq: Every day | ORAL | 2 refills | 30 days | Status: CP
Start: 2021-01-20 — End: 2022-01-20
  Filled 2021-02-02: qty 30, 30d supply, fill #0

## 2021-01-22 DIAGNOSIS — C9200 Acute myeloblastic leukemia, not having achieved remission: Principal | ICD-10-CM

## 2021-01-22 MED ORDER — LEVOTHYROXINE 100 MCG TABLET
ORAL_TABLET | Freq: Every day | ORAL | 0 refills | 90.00000 days | Status: CP
Start: 2021-01-22 — End: ?

## 2021-01-23 DIAGNOSIS — R350 Frequency of micturition: Principal | ICD-10-CM

## 2021-01-23 MED ORDER — FINASTERIDE 5 MG TABLET
ORAL_TABLET | Freq: Every day | ORAL | 0 refills | 90 days | Status: CP
Start: 2021-01-23 — End: 2022-01-23

## 2021-01-23 NOTE — Unmapped (Addendum)
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     Encounter Date: 01/24/2021    Current Treatment: tentative plan to start aza/ven outpatient    For oral chemotherapy: venetoclax PA pending; posaconazole PA pending  Pharmacy: TBD   Medication Access: TBD    Interval History:   David Riley reports he feels okay today. Has not had much shortness of breath but does feel weak and unsteady at times. No falls or losing balance. 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 No dizziess. No fevers or bleeding. No nausea/vomiting or constipation/diarrhea. Appetite has been good. Not getting much hydration but will increase PO fluids. No mouth sores but dry/cracked lips. Some pain from biopsy site but no new pain.     Labs: WBC 0.7, ANC 0.1; Hgb 7.8; PLT 56; CMP with creatinine 1.03 (baseline ~0.8)  Vitals: BP 123/67; HR 85    Oncologic History:  Oncology History   Acute myeloid leukemia (CMS-HCC)   01/19/2021 Initial Diagnosis    Acute myeloid leukemia (CMS-HCC)     01/31/2021 -  Chemotherapy    OP AML AZACITIDINE + VENETOCLAX  azacitidine 75 mg/m2 SQ on days 1-7, venetoclax ramp up Week 1 (dose dependent), then azacitidine 75 mg/m2 SQ on Days 1-7 every 28 days         Weight and Vitals:  Wt Readings from Last 3 Encounters:   01/13/21 86.2 kg (190 lb)   01/11/21 86.2 kg (190 lb)   12/18/20 82.6 kg (182 lb 3.2 oz)     Temp Readings from Last 3 Encounters:   01/19/21 35.9 ??C (96.6 ??F) (Oral)   12/14/20 36.9 ??C (98.4 ??F)   11/16/20 36.8 ??C (98.3 ??F) (Oral)     BP Readings from Last 3 Encounters:   01/19/21 102/58   01/11/21 123/66   12/18/20 129/64     Pulse Readings from Last 3 Encounters:   01/19/21 104   01/11/21 111   12/18/20 75       Pertinent Labs:  No visits with results within 1 Day(s) from this visit.   Latest known visit with results is:   No results displayed because visit has over 200 results.          Allergies: No Known Allergies    Drug Interactions:  Strong or moderate CYP3A inhibitors or P-gp inhibitors may require adjustments of venetoclax dose; Avoid strong or moderate CYP3A inducers;   -Venetoclax target dose should be 100 mg daily upon start of strong 3A4 inhibitor posaconazole.   -Will likely plan to switch atorvastatin to pravastatin to minimize 3A4 mediated DDI with posaconazole  -Posaconazole can also increase apixaban concentrations (currently on ppx dose 2.5 mg BID and ok to monitor)    Current Medications:  Current Outpatient Medications   Medication Sig Dispense Refill   ??? allopurinol (ZYLOPRIM) 300 MG tablet Take 1 tablet (300 mg total) by mouth daily. 30 tablet 2   ??? apixaban (ELIQUIS) 5 mg Tab Take 2.5 mg by mouth Two (2) times a day.     ??? atorvastatin (LIPITOR) 40 MG tablet Take 1 tablet (40 mg total) by mouth daily. 90 tablet 3   ??? finasteride (PROSCAR) 5 mg tablet Take 1 tablet (5 mg total) by mouth daily. 90  tablet 3   ??? gabapentin (NEURONTIN) 100 MG capsule Take up to 3 capsules tid or as directed. 270 capsule 3   ??? hydrocortisone 2.5 % cream Apply to face 2x/day as needed 30 g 3   ??? latanoprost (XALATAN) 0.005 % ophthalmic solution Administer 1 drop to both eyes nightly.      ??? levoFLOXacin (LEVAQUIN) 500 MG tablet Take 1 tablet (500 mg total) by mouth daily. 30 tablet 0   ??? levothyroxine (SYNTHROID) 100 MCG tablet Take 1 tablet (100 mcg total) by mouth daily. 90 tablet 0   ??? lidocaine 2% viscous (XYLOCAINE) 2 % Soln Take 15 mls by mouth every four (4) hours as needed for mouth pain. 100 mL 0   ??? metoprolol succinate (TOPROL-XL) 25 MG 24 hr tablet Take 1 tablet (25 mg total) by mouth daily. 90 tablet 3   ??? pilocarpine (SALAGEN, PILOCARPINE,) 5 MG tablet Take 1 tablet (5 mg total) by mouth Three (3) times a day. 90 tablet 11   ??? posaconazole (NOXAFIL) 100 mg delayed released tablet Take 3 tablets (300 mg total) by mouth daily. Do not start until instructed by outpatient oncology team. 90 tablet 0   ??? sertraline (ZOLOFT) 100 MG tablet Take 1 tablet (100 mg total) by mouth daily. 90 tablet 1   ??? timolol (TIMOPTIC) 0.5 % ophthalmic solution Administer 1 drop to the right eye Two (2) times a day.      ??? traZODone (DESYREL) 100 MG tablet Take 1 tablet (100 mg total) by mouth nightly. 90 tablet 3   ??? valACYclovir (VALTREX) 500 MG tablet Take 1 tablet (500 mg total) by mouth daily. 30 tablet 0   ??? venetoclax (VENCLEXTA) 100 mg tablet Take 4 tablets (400 mg total) by mouth daily. Take with a meal and water. Do not chew, crush, or break tablets. Do not start until instructed by outpatient oncology team. 120 tablet 6   ??? vit A/vit C/vit E/zinc/copper (PRESERVISION AREDS ORAL) Take by mouth Two (2) times a day.       No current facility-administered medications for this visit.       Adherence: no barriers identified         Assessment: David Riley is a 75 y.o. male with new diagnosis AML - planning to start treatment outpatient with azacitidine + venetoclax. Feeling well since hospital discharge. Will require blood transfusion today. Neutropenic and on appropriate prophylaxis outside of antifungal prophylaxis, which we will start after start of aza/ven to minimize DDIs during ramp-up phase of venetoclax. On TLS prophylaxis w/ allopurinol. Pending medication access for venetoclax but will likely plan to start aza/ven on 9/28. Will also require close monitoring of platelets and risk of both VTE and bleeding - currently on apixaban 2.5 mg BID while PLTs are ~50k.     Plan:    1) AML  -Tentative plan to start azacitidine + venetoclax outpatient once we have medication access - venetoclax PA pending; most likely start on 01/31/21     2) TLS prophylaxis  - Continue allopurinol 300 mg daily    3) Infection prophylaxis  - Continue levofloxacin 500 mg daily  - Continue valacyclovir 500 mg daily   - Posaconazole Rx has been sent for insurance investigation - patient aware not to start until instructed     4) Supportive care  - PRN lidocaine solution for mouth pain     5) History of DVT and A. fib (non-occlusive DVT in 2006, PE in 2007 while on warfarin,  PE in 2019 while on rivaroxaban, has been on apixaban ppx dose since)   ??- Continue apixaban 2.5 mg BID while PLTs remain ~50k - will consider option of holding once treatment is initiated to minimize bleed risk or other options (ie transfuse to lower PLT threshold or BH consult) given risk of recurrent VTE     6) CV (history of mild CAD), history of HFpEF   - Home furosemide held on discharge  - Likely plan to switch statin to pravastatin (due to drug interaction with posaconazole) when treatment begins & can also consider stopping altogether       F/u:  Future Appointments   Date Time Provider Department Center   01/24/2021  2:00 PM ADULT ONC LAB [facility]   01/24/2021  3:00 PM Sean Isrrael Fluckiger, AGNP [id number]   01/24/2021  3:30 PM ONCHEM LEUKEMIA PHARMACIST [id number]   01/31/2021 11:45 AM ADULT ONC LAB [facility]   01/31/2021 12:30 PM Guerry Bruin, MD [id number]   02/10/2021  3:00 PM Birdie Sons, NP [facility]   02/16/2021 11:00 AM Sasha Deutsch-Link, MD [facility]   04/05/2021  4:00 PM Puneet Ardis Hughs, MD [facility]   04/10/2021  1:30 PM [facility] [address] [facility]   04/10/2021  2:30 PM Francine Graven, MD [facility]   04/17/2021 10:40 AM Jenell Milliner, MD [facility]   07/12/2021 11:30 AM Gasper Sells, MD [facility]       I spent 25 minutes with David Riley in direct patient care.    Ronnald Collum, PharmD, BCOP, CPP  Hematology/Oncology Clinical Pharmacist  Pager [phone]

## 2021-01-24 ENCOUNTER — Ambulatory Visit: Admit: 2021-01-24 | Discharge: 2021-01-25 | Payer: MEDICARE

## 2021-01-24 ENCOUNTER — Other Ambulatory Visit: Admit: 2021-01-24 | Discharge: 2021-01-25 | Payer: MEDICARE

## 2021-01-24 ENCOUNTER — Encounter: Admit: 2021-01-24 | Discharge: 2021-01-25 | Payer: MEDICARE | Attending: Adult Health | Primary: Adult Health

## 2021-01-24 ENCOUNTER — Ambulatory Visit: Admit: 2021-01-24 | Discharge: 2021-01-25 | Payer: MEDICARE | Attending: Adult Health | Primary: Adult Health

## 2021-01-24 DIAGNOSIS — C9200 Acute myeloblastic leukemia, not having achieved remission: Principal | ICD-10-CM

## 2021-01-24 DIAGNOSIS — D472 Monoclonal gammopathy: Principal | ICD-10-CM

## 2021-01-24 NOTE — Unmapped (Signed)
[facility] Leukemia Clinic: Transitions of care visit    Patient Name: David Riley  Patient Age: 75 y.o.  Encounter Date: Result date not found.    Primary Care Provider:  Jenell Milliner, MD    Referring Physician:  Guerry Riley    Reason for visit:  Transitions of care visit      Assessment:   David Riley is a  75 y.o. 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      He has not begun treatment yet. Tentative plan is to start azacitidine-venetoclax. He has done well at home. The patient, his wife, and I discussed the initial treatment plans. Logistics will be a significant concern for the patient. His wife is vision impaired. His brother drove him today but he works full time. We discussed the clinic logistics, neutropenic precautions (highlighting the need to go to the closest ER should he develop a fever 100.4 or higher).      ................    Plan and Recommendations:  - seeking authorization for venetoclax. Tentative plan to start treatment once venetoclax is available  - continue allopurinol for TLS ppx  - supportive care for pancytopenia as detailed below        Supportive Care Recommendations:  We recommend based on the patient???s underlying diagnosis and treatment history the following supportive care:    1. 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 Blood product support:  Leukoreduced blood products are required.  Irradiated blood products are preferred, but in case of urgent transfusion needs non-irradiated blood products may be used:     -  RBC transfusion threshold: transfuse 2 units for Hgb < 8 g/dL.  - Platelet transfusion threshold: transfuse 1 unit of platelets for platelet count < 10, or for bleeding or need for invasive procedure.    Based on the patient's disease status and intensity of therapy, complete blood count with differential should be evaluated 2 times per week and used to guide transfusion support    3. Hematopoietic growth factor support: none       I personally spent 50 minutes face-to-face and non-face-to-face in the care of this patient, which includes all pre, intra, and post visit time on the date of service.    Dr. 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      Oncologic history is as follows:    Oncology History   Acute myeloid leukemia (CMS-HCC)   01/19/2021 Initial Diagnosis    Acute myeloid leukemia (CMS-HCC)     01/31/2021 -  Chemotherapy    OP AML AZACITIDINE + VENETOCLAX  azacitidine 75 mg/m2 SQ on days 1-7, venetoclax ramp up Week 1 (dose dependent), then azacitidine 75 mg/m2 SQ on Days 1-7 every 28 days       75 yo with new dx of AML and comorbidities of  mild cognitive dysfunction, HFpEF, nephrolithiasis, DVT, atrial fibrillation.  In sum, he presented with pancytopenia and had close to 20% circulating blasts.  BM Bx and flow cytometry demonstrated AML.  He has modest transfusion needs that can be safely supported in the outpatient setting, and no signs of developing proliferative leukemia.  Leukemia genetics are pending at time of discharge and will be reviewed in the clinic.  I outlined the overall structure of azacitidine and venetoclax with the patient and his wife.  We will seek authorization for treatment and plan to start in the clinic, unless specific targetable or very high risk mutations are present. Cognitive dysfunction is mild but I feel precludes him at this time for clinical trial informed consent.    INTERVAL HISTORY:  He reports doing okay at home since discharge.  No fevers  No n/v/c/d  No bleeding.  Baselines numbness in feet        PAST MEDICAL HISTORY:  Past Medical History:   Diagnosis Date   ??? Acute myeloid leukemia (CMS-HCC) 01/19/2021   ??? Anxiety    ??? At risk for falls    ??? Barrett's esophagus 2015    negative for dysplasia   ??? Clotting disorder (CMS-HCC)    ??? Cognitive impairment    ??? Crohn's disease (CMS-HCC)     Inactive.  Not on any meds.  No activity on colonoscopy, diagnosed in 1977, he has had bowel resections (unclear distribution).    ??? CVA (cerebral vascular accident) (CMS-HCC) 10/28/2020   ??? Deep vein thrombosis (DVT) (CMS-HCC)    ??? Depression    ??? Disease of thyroid gland    ??? Hyperlipidemia    ??? Hypothyroidism    ??? LOW BACK PAIN    ??? Nausea alone    ??? Noninfectious otitis externa 06/02/2016   ??? PULMONARY EMBOLISM    ??? 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 Sleep apnea, obstructive    ??? Tinnitus    ??? Urinary calculus 10/24/2010       MEDICATIONS:  Current Outpatient Medications   Medication Sig Dispense Refill   ??? allopurinol (ZYLOPRIM) 300 MG tablet Take 1 tablet (300 mg total) by mouth daily. 30 tablet 2   ??? apixaban (ELIQUIS) 5 mg Tab Take 2.5 mg by mouth Two (2) times a day.     ??? atorvastatin (LIPITOR) 40 MG tablet Take 1 tablet (40 mg total) by mouth daily. 90 tablet 3   ??? finasteride (PROSCAR) 5 mg tablet Take 1 tablet (5 mg total) by mouth daily. 90 tablet 0   ??? gabapentin (NEURONTIN) 100 MG capsule Take up to 3 capsules tid or as directed. 270 capsule 3   ??? hydrocortisone 2.5 % cream Apply to face 2x/day as needed 30 g 3   ??? latanoprost (XALATAN) 0.005 % ophthalmic solution Administer 1 drop to both eyes nightly.      ??? levoFLOXacin (LEVAQUIN) 500 MG tablet Take 1 tablet (500 mg total) by mouth daily. 30 tablet 0   ??? levothyroxine (SYNTHROID) 100 MCG tablet Take 1 tablet (100 mcg total) by mouth daily. 90 tablet 0   ??? lidocaine 2% viscous (XYLOCAINE) 2 % Soln Take 15 mls by mouth every four (4) hours as needed for mouth pain. 100 mL 0   ??? metoprolol succinate (TOPROL-XL) 25 MG 24 hr tablet Take 1 tablet (25 mg total) by mouth daily. 90 tablet 3   ??? pilocarpine (SALAGEN, PILOCARPINE,) 5 MG tablet Take 1 tablet (5 mg total) by mouth  Three (3) times a day. 90 tablet 11   ??? posaconazole (NOXAFIL) 100 mg delayed released tablet Take 3 tablets (300 mg total) by mouth daily. Do not start until instructed by outpatient oncology team. 90 tablet 0   ??? sertraline (ZOLOFT) 100 MG tablet Take 1 tablet (100 mg total) by mouth daily. 90 tablet 1   ??? timolol (TIMOPTIC) 0.5 % ophthalmic solution Administer 1 drop to the right eye Two (2) times a day.      ??? traZODone (DESYREL) 100 MG tablet Take 1 tablet (100 mg total) by mouth nightly. 90 tablet 3   ??? valACYclovir (VALTREX) 500 MG tablet Take 1 tablet (500 mg total) by mouth daily. 30 tablet 0   ??? venetoclax (VENCLEXTA) 100 mg tablet Take 4 tablets (400 mg total) by mouth daily. Take with a meal and water. Do not chew, crush, or break tablets. Do not start until instructed by outpatient oncology team. 120 tablet 6   ??? vit A/vit C/vit E/zinc/copper (PRESERVISION AREDS ORAL) Take by mouth Two (2) times a day.       No current facility-administered medications for this visit.         ALLERGIES:  No Known Allergies    SOCIAL HISTORY:  Social History     Social History Narrative   ??? Not on file            FAMILY HISTORY:  Family History   Problem Relation Age of Onset   ??? Alcohol abuse Father    ??? Osteoporosis Mother         Per [employer], cause of death   ??? Nephrolithiasis Mother    ??? Inflammatory bowel disease Son         Colitis   ??? Alcohol abuse Daughter    ??? Heart failure Neg Hx    ??? Heart disease Neg Hx    ??? Heart attack Neg Hx    ??? Arrhythmia Neg Hx    ??? GU problems Neg Hx    ??? Kidney cancer Neg Hx    ??? Prostate cancer Neg Hx    ??? Drug abuse Neg Hx    ??? Mental illness Neg Hx    ??? Kidney disease Neg Hx    ??? Diabetes Neg Hx    ??? Lung disease Neg Hx    ??? Melanoma Neg Hx    ??? Basal cell carcinoma Neg Hx    ??? Squamous cell carcinoma Neg Hx    ??? Neuropathy Neg Hx    ??? Neuromuscular disorder Neg Hx        REVIEW OF SYSTEMS:  See HPI. A 10 system ROS is otherwise negative.     ECOG Performance Status: 2 - Ambulatory and capable of all selfcare but unable to carry out any work activities. Up and about more than 50% of waking hours    PHYSICAL EXAMINATION:  VS:   BP 123/67  - Pulse 85  - Temp 36.8 ??C (98.3 ??F) (Oral)  - Resp 16  - Wt 83.6 kg (184 lb 6.4 oz)  - SpO2 99%  - BMI 27.22 kg/m??     General: Resting in no apparent distress, accompanied by spouse  HEENT:  Clear sclera, conjunctiva, mask in place  LYMPH:  no palpable cervical, supraclavicular, axillary, or inguinal nodes  CARDAC: RRR, no R,M,Gs, no pitting peripheral edema  RESP: nonlabored, bilaterally CTA  GI: Soft, nontender, active bowel sounds, no hepatic or splenomegaly  NEURO: alert, 0x4, steady  gait, no focal deficits  PSYCH: appropriate  DERM: no visible rashes, lesions  LINE: none        LABORATORY DATA:  Lab Results   Component Value Date    WBC 0.7 (L) 01/24/2021    HGB 7.8 (L) 01/24/2021    HCT 22.3 (L) 01/24/2021    PLT 56 (L) 01/24/2021       Lab Results   Component Value Date    NA 142 01/24/2021    K 3.9 01/24/2021    CL 110 (H) 01/24/2021    CO2 24.0 01/24/2021    BUN 21 01/24/2021    CREATININE 1.03 01/24/2021    GLU 95 01/24/2021    CALCIUM 8.8 01/24/2021    MG 1.7 01/19/2021    PHOS 3.2 01/19/2021       Lab Results   Component Value Date    BILITOT 0.6 01/24/2021    BILIDIR 0.20 07/06/2017    PROT 6.6 01/24/2021    ALBUMIN 3.4 01/24/2021    ALT 16 01/24/2021    AST 22 01/24/2021    ALKPHOS 102 01/24/2021    GGT 38 06/23/2012       Lab Results   Component Value Date    LABPROT 11.3 07/10/2014    INR 1.24 01/19/2021    INR 1.30 01/19/2021    APTT 33.1 01/19/2021

## 2021-01-25 NOTE — Unmapped (Signed)
[facility] Specialty Medication Onboarding    Specialty Medication: VENCLEXTA 100 mg tablet (venetoclax)  Prior Authorization: Approved   Financial Assistance: Yes - grant approved as secondary   Final Copay/Day Supply: $0 / 30    Insurance Restrictions: None     Notes to Pharmacist: n/a    The triage team has completed the benefits investigation and has determined that the patient is able to fill this medication at [facility]. Please contact the patient to complete the onboarding or follow up with the prescribing physician as needed.

## 2021-01-25 NOTE — Unmapped (Signed)
[facility]   Patient Onboarding/Medication Counseling    David Riley confirmed starting allopurinol 300 mg daily and increased water intake to 48 - 64 ounces of water daily.    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  I am speaking to the patient.    Was a Nurse, learning disability used for this call? No    Verified patient's date of birth / HIPAA.    Specialty medication(s) to be sent: Hematology/Oncology: Venclexta (posaconazole pending financial assistance)    Non-specialty medications/supplies to be sent: none    Medications not needed at this time: none     Venclexta (Venetoclax)    Medication & Administration     Dosage: AML: Day 1: Take 100 mg by mouth once daily. Day 2: Take 200 mg by mouth once daily. Day 3 and thereafter: Take 400 mg by mouth once daily.  Prescription label directions: Take 4 tablets (400 mg total) by mouth daily. Take with a meal and water. Do not chew, crush, or break tablets. Do not start until instructed by outpatient oncology team.    Administration:   ??? Take with a meal.  ??? Swallow whole with a glass of water. Do not break, crush or chew.    ??? Drink plenty of water when taking this drug unless told to drink less water by your doctor. Drink 6 to 8 glasses (about 56 ounces total) of water each day. Start doing this 2 days before your first dose.   ??? Do not stop taking unless instructed to do so by your doctor.    ??? If you throw up after taking a dose, do not repeat the dose. Take your next dose at your normal time.  Adherence/Missed dose instructions: Take a missed dose as soon as you think about.  If it has been more than 8 hours since the missed dose, skip the missed dose and go back to your normal time. Do not take 2 doses at the same time or extra doses.  Goals of Therapy     ??? To prevent disease progression    Side Effects & Monitoring Parameters     Commonly reported side effects  ??? Abdominal pain   ??? Common cold symptoms  ??? Fatigue, loss of strength and energy  ??? Constipation  ??? Muscle pain, joint pain, back pain  ??? Mouth irritation, mouth sores  ??? Restlessness, difficulty seeping    The following side effects should be reported to the provider:  ??? Signs of infection (fever >100.4, chills, mouth sores, sputum production)  ??? Signs of bleeding (vomiting or coughing up blood, blood that looks like coffee grounds, blood in the urine or black, red tarry stools, bruising that gets bigger without reason, any persistent or severe bleeding, impaired wound healing)  ??? Signs of low blood sugar (dizziness, headache, fatigue, feeling weak, shaking, fast heartbeat, confusion, increased hunger or sweating)  ??? Signs of high blood sugar (confusion, fatigue increased thirst, increased hunger, passing a lot of urine, flushing, fast breathing or breath that smells like fruit)  ??? Signs of fluid and electrolyte problems (mood changes, confusion, muscle pain or weakness, abnormal/fast heartbeat, severe dizziness, passing out)  ??? Signs of tumor lysis syndrome (fast heartbeat or abnormal heartbeat; any passing out; unable to pass urine; muscle weakness or cramps; nausea, vomiting, diarrhea or lack of appetite; or feeling sluggish)  ??? Severe headache, dizziness, passing out  ???  Vision changes  ??? Severe loss of energy and strength  ??? Dark urine, urine discoloration, yellow eyes or skin  ??? Severe nausea, vomiting, inability to eat  ??? Severe diarrhea   ??? Shortness of breath  ??? Swelling  ??? Bruising  ??? Pale skin  ??? Signs of anaphylaxis (wheezing, chest tightness, swelling of face, lips, tongue or throat)    Monitoring Parameters:   ??? CBC with differential (throughout treatment)  ??? Blood chemistries (potassium, uric acid, phosphorus, calcium, and creatinine)  ??? Pregnancy test (prior to treatment in females of reproductive potential)  ??? Assess tumor burden, including radiographic evaluation (eg, CT scan) for tumor lysis syndrome (TLS) risk evaluation;   ??? Monitor for signs/symptoms of infection.   ??? Monitor adherence.    Contraindications, Warnings, & Precautions   Contraindications:  ??? Concomitant use with strong CYP3A inhibitors at initiation and during ramp-up phase in patients with chronic lymphocytic leukemia/small lymphocytic lymphoma due to the potential for increased risk of tumor lysis syndrome.   Warnings & Precautions:   ??? Bone marrow suppression: Neutropenia, thrombocytopenia, and anemia may occur. Neutropenic fever has been reported both with monotherapy and combination therapy.   ??? Infection: Serious and fatal infections, including pneumonia and sepsis, have occurred.   ??? Tumor lysis syndrome: Tumor lysis syndrome including fatalities and renal failure requiring dialysis has occurred in patients with high tumor burden when treated with venetoclax.   ??? Hepatic impairment: Dosage adjustment recommended in patients with severe hepatic impairment. Monitor closely for toxicities.  ??? Multiple myeloma: In patients with relapsed or refractory multiple myeloma, an increase in mortality was noted when venetoclax was added to bortezomib and dexamethasone. Venetoclax is not approved for the treatment of multiple myeloma.  ??? Renal impairment: Patients with decreased renal function (CrCl <80 mL/minute) are at increased risk for TLS and may require more intensive TLS prophylaxis and monitoring during treatment initiation and dose escalation.  ??? Immunizations: Live vaccinations should not be administered prior to, during, or after venetoclax treatment until B-cell recovery occurs. Vaccines may be less effective.  ??? Reproductive Considerations: Females of reproductive potential should have a pregnancy test prior to therapy and use effective contraception during treatment and for at least 30 days after the final dose.  Based on the mechanism of action and data from animal reproduction studies, venetoclax is expected to cause fetal harm if administered during pregnancy.  ??? Breastfeeding considerations: It is not known if venetoclax is present in breast milk. Due to the potential for serious adverse reactions in the breastfed infant, breastfeeding is not recommended by the manufacturer.  Drug/Food Interactions     ??? Medication list reviewed in Epic. The patient was instructed to inform the care team before taking any new medications or supplements. Posaconazole may increase the serum concentration of Venetoclax. - Venetoclax will be dose adjusted once posa therapy initiated.   ??? Avoid live vaccines. (venetoclax may diminish the therapeutic effects of live vaccines and enhance the toxic/adverse effects of live vaccines.  ??? Avoid grapefruit and grapefruit juice. (may increase serum concentrations of venetoclax)  ??? Avoid Seville oranges and star fruit. (May increase serum concentrations of venetoclax)    Storage, Handling Precautions, & Disposal   ??? Store at room temperature in the original container (do not use a pillbox or store with other medications).   ??? Caregivers helping administer medication should wear gloves and wash hands immediately after.    ??? Keep the lid tightly closed. Keep out of the reach  of children and pets.  ??? Do not flush down a toilet or pour down a drain unless instructed to do so.  Check with your local police department or fire station about drug take-back programs in your area.      Current Medications (including OTC/herbals), Comorbidities and Allergies     Current Outpatient Medications   Medication Sig Dispense Refill   ??? allopurinol (ZYLOPRIM) 300 MG tablet Take 1 tablet (300 mg total) by mouth daily. 30 tablet 2   ??? apixaban (ELIQUIS) 5 mg Tab Take 2.5 mg by mouth Two (2) times a day.     ??? atorvastatin (LIPITOR) 40 MG tablet Take 1 tablet (40 mg total) by mouth daily. 90 tablet 3   ??? finasteride (PROSCAR) 5 mg tablet Take 1 tablet (5 mg total) by mouth daily. 90 tablet 0   ??? gabapentin (NEURONTIN) 100 MG capsule Take up to 3 capsules tid or as directed. 270 capsule 3   ??? hydrocortisone 2.5 % cream Apply to face 2x/day as needed 30 g 3   ??? latanoprost (XALATAN) 0.005 % ophthalmic solution Administer 1 drop to both eyes nightly.      ??? levoFLOXacin (LEVAQUIN) 500 MG tablet Take 1 tablet (500 mg total) by mouth daily. 30 tablet 0   ??? levothyroxine (SYNTHROID) 100 MCG tablet Take 1 tablet (100 mcg total) by mouth daily. 90 tablet 0   ??? lidocaine 2% viscous (XYLOCAINE) 2 % Soln Take 15 mls by mouth every four (4) hours as needed for mouth pain. 100 mL 0   ??? metoprolol succinate (TOPROL-XL) 25 MG 24 hr tablet Take 1 tablet (25 mg total) by mouth daily. 90 tablet 3   ??? pilocarpine (SALAGEN, PILOCARPINE,) 5 MG tablet Take 1 tablet (5 mg total) by mouth Three (3) times a day. 90 tablet 11   ??? posaconazole (NOXAFIL) 100 mg delayed released tablet Take 3 tablets (300 mg total) by mouth daily. Do not start until instructed by outpatient oncology team. 90 tablet 0   ??? sertraline (ZOLOFT) 100 MG tablet Take 1 tablet (100 mg total) by mouth daily. 90 tablet 1   ??? timolol (TIMOPTIC) 0.5 % ophthalmic solution Administer 1 drop to the right eye Two (2) times a day.      ??? traZODone (DESYREL) 100 MG tablet Take 1 tablet (100 mg total) by mouth nightly. 90 tablet 3   ??? valACYclovir (VALTREX) 500 MG tablet Take 1 tablet (500 mg total) by mouth daily. 30 tablet 0   ??? venetoclax (VENCLEXTA) 100 mg tablet Take 4 tablets (400 mg total) by mouth daily. Take with a meal and water. Do not chew, crush, or break tablets. Do not start until instructed by outpatient oncology team. 120 tablet 6   ??? vit A/vit C/vit E/zinc/copper (PRESERVISION AREDS ORAL) Take by mouth Two (2) times a day.       No current facility-administered medications for this visit.       No Known Allergies    Patient Active Problem List   Diagnosis   ??? Long term current use of anticoagulant therapy   ??? Low back pain   ??? Insomnia   ??? Kidney stone   ??? Hearing loss   ??? Ventral hernia   ??? Other pulmonary embolism without acute cor pulmonale (CMS-HCC)   ??? Anxiety and depression   ??? 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 Hypothyroidism   ??? Closed compression fracture of  thoracic vertebra (CMS-HCC)   ??? Barrett's esophagus   ??? Benign prostatic hyperplasia with nocturia   ??? Compression fracture of lumbar vertebra (CMS-HCC)   ??? Crohn's disease of large intestine (CMS-HCC)   ??? Psychogenic cyclical vomiting   ??? Dyslipidemia   ??? Risk for falls   ??? Oral lichen planus   ??? OSA (obstructive sleep apnea)   ??? Idiopathic progressive polyneuropathy   ??? Macrocytosis   ??? Memory impairment   ??? Axonal sensorimotor neuropathy   ??? Dry mouth   ??? Vitamin B6 deficiency   ??? Tinnitus of both ears   ??? Lower urinary tract symptoms (LUTS)   ??? MGUS (monoclonal gammopathy of unknown significance)   ??? Dysesthesia   ??? Subcutaneous mass of back   ??? Benign prostatic hyperplasia with urinary obstruction   ??? Deep vein thrombosis (DVT) (CMS-HCC)   ??? Depression, major, recurrent, mild (CMS-HCC)   ??? Urine frequency   ??? Upper back pain   ??? Current moderate episode of major depressive disorder without prior episode (CMS-HCC)   ??? Bladder neck contracture   ??? POEMS syndrome   ??? Glaucoma   ??? Microscopic hematuria   ??? Sensorimotor neuropathy   ??? Gait disturbance   ??? Tremor   ??? Acute CHF (congestive heart failure) (CMS-HCC)   ??? CVA (cerebral vascular accident) (CMS-HCC)   ??? Pancytopenia (CMS-HCC)   ??? Unspecified atrial fibrillation (CMS-HCC)   ??? Fatigue   ??? Gross hematuria   ??? History of CHF (congestive heart failure)   ??? Acute myeloid leukemia (CMS-HCC)       Reviewed and up to date in Epic.    Appropriateness of Therapy     Acute infections noted within Epic:  No active infections  Patient reported infection: None    Is medication and dose appropriate based on diagnosis and infection status? 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 For example, brushing your teeth or getting up in the morning. 0    Financial Information     Medication Assistance provided: Prior Authorization and [employer]    Anticipated copay of $0 / 30 days reviewed with patient. Verified delivery address.    Delivery Information     Scheduled delivery date: 01/30/21    Expected start date: 01/31/21    Medication will be delivered via Next Day Courier to the prescription address in [facility].  This shipment will not require a signature.      Explained the services we provide at [facility] and that each month we would call to set up refills.  Stressed importance of returning phone calls so that we could ensure they receive their medications in time each month.  Informed patient that we should be setting up refills 7-10 days prior to when they will run out of medication.  A pharmacist will reach out to perform a clinical assessment periodically.  Informed patient that a welcome packet, containing information about our pharmacy and other support services, a Notice of Privacy Practices, and a drug information handout will be sent.      The patient or caregiver noted above participated in the development of this care plan and knows that they can request review of or adjustments to the care plan at any  time.      Patient or caregiver verbalized understanding of the above information as well as how to contact the pharmacy at [phone] option 4 with any questions/concerns.  The pharmacy is open Monday through Friday 8:30am-4:30pm.  A pharmacist is available 24/7 via pager to answer any clinical questions they may have.    Patient Specific Needs     - Does the patient have any physical, cognitive, or cultural barriers? No    - Does the patient have adequate living arrangements? (i.e. the ability to store and take their medication appropriately) Yes    - Did you identify any home environmental safety or security hazards? No    - Patient prefers to have medications discussed with  Patient     - Is the patient or caregiver able to read and understand education materials at a high school level or above? Yes    - Patient's primary language is  English     - Is the patient high risk? Yes, patient is taking oral chemotherapy. Appropriateness of therapy as been assessed    - Does the patient require physician intervention or other additional services (i.e. dietary/nutrition, smoking cessation, social work)? No      David Riley A [facility] Specialty Pharmacist

## 2021-01-26 NOTE — Unmapped (Signed)
[facility] called requesting verbal orders of PT 1 x week for 5 weeks then 1 every other week.  Orders gven

## 2021-01-26 NOTE — Unmapped (Signed)
AML Venetoclax Scheduling Form    Regimen:                         [x] Venetoclax/Azacitidine    Week 1: (WK 1 DATES: 01/31/2021)  ??? C1D1 MD or APP appointment (if not seen within 1 week prior):   - (Already scheduled) clinic visit with Dr. Malen Gauze on 01/31/2021  ??? C1D1 CPP appointment:   - Provider visit with Kendal Hymen, CCP, PhamD  - It was explicitly reviewed with the patient to bring his initial dispense of venetoclax with him from the [facility] to the visit on 01/31/2021.    [x] AZACITIDINE  ??? D1 - Labs, Infusion *hydration will be given over 2 hours  ??? D2 - Labs, Infusion *hydration will be given over 2 hours  ??? D3 - Labs, Infusion *hydration will be given over 2 hours  ??? D4 - Labs, Infusion *hydration will be given over 2 hours  ??? D5 - Infusion  ??? D6 - Infusion  ??? D7 - Labs, Infusion    Week 2: (WK 2 DATES: 02/05/2021)  ??? Day 8 CPP appointment (for access/medication compliance):   - Clinic visit with Kendal Hymen, CCP, PhamD on Monday, February 05, 2021  ??? APP or MD appointment (with labs):  - Clinic visit with either Langley Gauss, NP or Dr. Malen Gauze on Monday, February 05, 2021  ??? Possible transfusions (Monday/Thursday or Tuesday/Friday depending on above appt):  - Monday, February 05, 2021 (Already Scheduled)  - Friday, February 09, 2021     Week 3: (WK 3 DATES: 03-08-21)  ??? Day 15 - CPP appointment (with labs):   - Clinic visit with Kendal Hymen, CCP, PhamD on Tuesday, February 13, 2021  ??? APP or MD appointment (with labs):   - Clinic visit with either Langley Gauss, NP or Dr. Malen Gauze on Tuesday, February 13, 2021  ??? Possible transfusions (Monday/Thursday or Tuesday/Friday depending on above appt):   - Tuesday, February 13, 2021   - Friday, February 16, 2021    Week 4/5: (WK 4/5 DATES: 02/19/2021)  ??? Bone marrow biopsy Day 21-26 (with labs):   - Tuesday, February 20, 2021  ??? Post-marrow APP or MD appointment (with labs) at least 48 hrs after BMBx:  - Clinic visit with Dr. Malen Gauze on Wednesday, February 21, 2021  ??? Possible transfusions (Monday/Thursday or Tuesday/Friday depending on above appt):   - Monday, February 19, 2021  - Wednesday, February 21, 2021

## 2021-01-30 NOTE — Unmapped (Signed)
OT called requesting verbal orders for continued OT 2 x weekly for 5 weeks. Orders given

## 2021-01-30 NOTE — Unmapped (Signed)
Outpatient Oncology Social Work   Initial Clinical Assessment        Referral:   Wife, Oather Muilenburg    Oncology Treatment Status:   Pt is a 75yo male with a diagnosis of acute myeloid leukemia.    Financial/Insurance:   Pt has [employer].    Psychosocial/Coping Issues:   Pt's wife reached out to ask if they could stay at [facility] because Pt has consecutive days of treatment starting 9/28 through 10/4. Pt and his wife live in [place], which is less than the 50 mile requirement for [facility]. SW reached out to [facility] to ask if it were possible for Pt and his wife to stay while he receives treatment as Pt is not sure he can drive and Pt's wife is unable to drive due to macular degeneration. SW was informed that [facility] is able to accommodate the request and a referral will need to be sent.    SW spoke with Pt's wife Jackey Loge and talked about staying at [facility]. She states that they decided not to stay at [facility] and a neighbor will be assisting with transportation. SW explained that if Pt is interested in a referral, they can call SW. Wife reported understanding and has no further questions.    Follow-Up Plan:   Pt has this SW's contact information and will reach out for assistance as needed.      Annice Pih, LCSW  Oncology Outpatient Social Worker  [phone]

## 2021-01-31 ENCOUNTER — Ambulatory Visit: Admit: 2021-01-31 | Discharge: 2021-02-01 | Payer: MEDICARE | Attending: Internal Medicine | Primary: Internal Medicine

## 2021-01-31 ENCOUNTER — Encounter: Admit: 2021-01-31 | Discharge: 2021-02-01 | Payer: MEDICARE | Attending: Adult Health | Primary: Adult Health

## 2021-01-31 ENCOUNTER — Other Ambulatory Visit: Admit: 2021-01-31 | Discharge: 2021-02-01 | Payer: MEDICARE

## 2021-01-31 ENCOUNTER — Ambulatory Visit: Admit: 2021-01-31 | Discharge: 2021-02-01 | Payer: MEDICARE

## 2021-01-31 DIAGNOSIS — C9200 Acute myeloblastic leukemia, not having achieved remission: Principal | ICD-10-CM

## 2021-01-31 LAB — CBC W/ AUTO DIFF
BASOPHILS ABSOLUTE COUNT: 0 10*9/L (ref 0.0–0.1)
BASOPHILS RELATIVE PERCENT: 0 %
EOSINOPHILS ABSOLUTE COUNT: 0 10*9/L (ref 0.0–0.5)
EOSINOPHILS RELATIVE PERCENT: 1.2 %
HEMATOCRIT: 22 % — ABNORMAL LOW (ref 39.0–48.0)
HEMOGLOBIN: 7.8 g/dL — ABNORMAL LOW (ref 12.9–16.5)
LYMPHOCYTES ABSOLUTE COUNT: 0.5 10*9/L — ABNORMAL LOW (ref 1.1–3.6)
LYMPHOCYTES RELATIVE PERCENT: 67.7 %
MEAN CORPUSCULAR HEMOGLOBIN CONC: 35.6 g/dL (ref 32.0–36.0)
MEAN CORPUSCULAR HEMOGLOBIN: 34.9 pg — ABNORMAL HIGH (ref 25.9–32.4)
MEAN CORPUSCULAR VOLUME: 98 fL — ABNORMAL HIGH (ref 77.6–95.7)
MEAN PLATELET VOLUME: 8.2 fL (ref 6.8–10.7)
MONOCYTES ABSOLUTE COUNT: 0.1 10*9/L — ABNORMAL LOW (ref 0.3–0.8)
MONOCYTES RELATIVE PERCENT: 17.1 %
NEUTROPHILS ABSOLUTE COUNT: 0.1 10*9/L — CL (ref 1.8–7.8)
NEUTROPHILS RELATIVE PERCENT: 14 %
PLATELET COUNT: 35 10*9/L — ABNORMAL LOW (ref 150–450)
RED BLOOD CELL COUNT: 2.24 10*12/L — ABNORMAL LOW (ref 4.26–5.60)
RED CELL DISTRIBUTION WIDTH: 24.4 % — ABNORMAL HIGH (ref 12.2–15.2)
WBC ADJUSTED: 0.7 10*9/L — ABNORMAL LOW (ref 3.6–11.2)

## 2021-01-31 LAB — COMPREHENSIVE METABOLIC PANEL
ALBUMIN: 3 g/dL — ABNORMAL LOW (ref 3.4–5.0)
ALKALINE PHOSPHATASE: 105 U/L (ref 46–116)
ALT (SGPT): 15 U/L (ref 10–49)
ANION GAP: 8 mmol/L (ref 5–14)
AST (SGOT): 22 U/L (ref <=34)
BILIRUBIN TOTAL: 0.5 mg/dL (ref 0.3–1.2)
BLOOD UREA NITROGEN: 17 mg/dL (ref 9–23)
BUN / CREAT RATIO: 19
CALCIUM: 8.1 mg/dL — ABNORMAL LOW (ref 8.7–10.4)
CHLORIDE: 110 mmol/L — ABNORMAL HIGH (ref 98–107)
CO2: 23 mmol/L (ref 20.0–31.0)
CREATININE: 0.89 mg/dL
EGFR CKD-EPI (2021) MALE: 89 mL/min/{1.73_m2} (ref >=60)
GLUCOSE RANDOM: 85 mg/dL (ref 70–179)
POTASSIUM: 3.3 mmol/L — ABNORMAL LOW (ref 3.4–4.8)
PROTEIN TOTAL: 5.9 g/dL (ref 5.7–8.2)
SODIUM: 141 mmol/L (ref 135–145)

## 2021-01-31 LAB — LACTATE DEHYDROGENASE: LACTATE DEHYDROGENASE: 235 U/L (ref 120–246)

## 2021-01-31 LAB — MAGNESIUM: MAGNESIUM: 1.3 mg/dL — ABNORMAL LOW (ref 1.6–2.6)

## 2021-01-31 LAB — SLIDE REVIEW

## 2021-01-31 LAB — URIC ACID: URIC ACID: 3.9 mg/dL

## 2021-01-31 LAB — PHOSPHORUS: PHOSPHORUS: 2.5 mg/dL (ref 2.4–5.1)

## 2021-01-31 MED ORDER — PROCHLORPERAZINE MALEATE 10 MG TABLET
ORAL_TABLET | Freq: Four times a day (QID) | ORAL | 0 refills | 8 days | Status: CP | PRN
Start: 2021-01-31 — End: ?

## 2021-01-31 MED ORDER — TRAMADOL 50 MG TABLET
ORAL_TABLET | Freq: Four times a day (QID) | ORAL | 0 refills | 8 days | Status: CP | PRN
Start: 2021-01-31 — End: ?

## 2021-01-31 MED ADMIN — ondansetron (ZOFRAN) tablet 8 mg: 8 mg | ORAL | @ 19:00:00 | Stop: 2021-01-31

## 2021-01-31 MED ADMIN — sodium chloride 0.9% (NS) bolus 500 mL: 500 mL | INTRAVENOUS | @ 19:00:00 | Stop: 2021-01-31

## 2021-01-31 MED ADMIN — azaCITIDine (VIDAZA) syringe: 75 mg/m2 | SUBCUTANEOUS | @ 20:00:00 | Stop: 2021-01-31

## 2021-01-31 NOTE — Unmapped (Addendum)
Good to see you today.    We discussed your AML today. You will proceed with cycle 1 of azacitidine-venetoclax. This will consist of:  Azacitidine 75 mg/m2 IV days 1-7  Venetoclax by mouth 1 tablet on day 1, 2 tablets on day 2, 4 tablets from day 3 onwards    If your AML does not respond to the current treatment, we have other treatment options that can target the other genetic mutations that you have.     You will have a repeat bone marrow biopsy on October 18 to assess how your leukemia is responding to treatment. Follow up with Dr. Malen Gauze on October 19.      Results for orders placed or performed in visit on 01/31/21   Comprehensive Metabolic Panel   Result Value Ref Range    Sodium 141 135 - 145 mmol/L    Potassium 3.3 (L) 3.4 - 4.8 mmol/L    Chloride 110 (H) 98 - 107 mmol/L    CO2 23.0 20.0 - 31.0 mmol/L    Anion Gap 8 5 - 14 mmol/L    BUN 17 9 - 23 mg/dL    Creatinine 2.95 6.21 - 1.10 mg/dL    BUN/Creatinine Ratio 19     eGFR CKD-EPI (2021) Male 89 >=60 mL/min/1.29m2    Glucose 85 70 - 179 mg/dL    Calcium 8.1 (L) 8.7 - 10.4 mg/dL    Albumin 3.0 (L) 3.4 - 5.0 g/dL    Total Protein 5.9 5.7 - 8.2 g/dL    Total Bilirubin 0.5 0.3 - 1.2 mg/dL    AST 22 <=30 U/L    ALT 15 10 - 49 U/L    Alkaline Phosphatase 105 46 - 116 U/L   Magnesium Level   Result Value Ref Range    Magnesium 1.3 (L) 1.6 - 2.6 mg/dL   Phosphorus Level   Result Value Ref Range    Phosphorus 2.5 2.4 - 5.1 mg/dL   Lactate dehydrogenase   Result Value Ref Range    LDH 235 120 - 246 U/L   Uric acid   Result Value Ref Range    Uric Acid 3.9 3.7 - 9.2 mg/dL   CBC w/ Differential   Result Value Ref Range    WBC 0.7 (L) 3.6 - 11.2 10*9/L    RBC 2.24 (L) 4.26 - 5.60 10*12/L    HGB 7.8 (L) 12.9 - 16.5 g/dL    HCT 86.5 (L) 78.4 - 48.0 %    MCV 98.0 (H) 77.6 - 95.7 fL    MCH 34.9 (H) 25.9 - 32.4 pg    MCHC 35.6 32.0 - 36.0 g/dL    RDW 69.6 (H) 29.5 - 15.2 %    MPV 8.2 6.8 - 10.7 fL    Platelet 35 (L) 150 - 450 10*9/L    Neutrophils % 14.0 %    Lymphocytes % 67.7 %    Monocytes % 17.1 %    Eosinophils % 1.2 %    Basophils % 0.0 %    Absolute Neutrophils 0.1 (LL) 1.8 - 7.8 10*9/L    Absolute Lymphocytes 0.5 (L) 1.1 - 3.6 10*9/L    Absolute Monocytes 0.1 (L) 0.3 - 0.8 10*9/L    Absolute Eosinophils 0.0 0.0 - 0.5 10*9/L    Absolute Basophils 0.0 0.0 - 0.1 10*9/L    Anisocytosis Marked (A) Not Present

## 2021-01-31 NOTE — Unmapped (Signed)
Great speaking with you regarding your new chemotherapy regimen! Here's a summary of what we discussed:    HOLD eliquis for now while platelets are < 50    I sent tramadol (pain med) to take as needed to your local pharmacy. Let's avoid tylenol for now.    You will likely get a blood transfusion either today or tomorrow    We will also continue to follow potassium and magnesium and replace if needed    Chemo information:  You will be receiving 2 types of chemotherapy as part of this regimen. One type is an injection, called azacitidine. You will receive this as a shot for 7 straight days in our infusion center. They will give you some anti-nausea medication each day prior to your injection, called Zofran. This can sometimes cause constipation, so feel free to add Miralax to your regimen if needed.    The other chemotherapy called venetoclax is in a tablet form that you will take at home. I pasted a handout below. You will take this everyday at home WITH FOOD. In order to prevent something called tumor lysis syndrome, we will do this in a ramp-up as follows:  Day 1 = 1 tablet  Day 2 = 2 tablets  Day 3 and beyond = 4 tablets    We may start a preventative antifungal in the future. This is often something called posaconazole (Noxafil). This INTERACTS with your venetoclax, so we will ask you to dose reduce the venetoclax to just 1 tablet daily when the antifungal medication is started. If you receive the posaconazole in the mail, please do NOT start it until we discuss this dose change. More to come    Continue other infection prevention medications including levofloxacin and valtrex once daily.     Continue allopurinol daily and try to stay hydrated (aim for drinking 6-8 glasses of water per day). We will also give you some IV fluids in the infusion center several days of the first week of chemo. Allopurinol and hydrating helps to prevent tumor lysis syndrome.     You will get calls from our team or have visits in clinic throughout your first cycle to check in. You will also need labs at least twice weekly after the first week so we can monitor for transfusion needs. Keep an eye on MyChart and your visit summaries regarding appointments.     Lab on 01/31/2021   Component Date Value Ref Range Status    Sodium 01/31/2021 141  135 - 145 mmol/L Final    Potassium 01/31/2021 3.3 (A) 3.4 - 4.8 mmol/L Final    Chloride 01/31/2021 110 (A) 98 - 107 mmol/L Final    CO2 01/31/2021 23.0  20.0 - 31.0 mmol/L Final    Anion Gap 01/31/2021 8  5 - 14 mmol/L Final    BUN 01/31/2021 17  9 - 23 mg/dL Final    Creatinine 81/19/1478 0.89  0.60 - 1.10 mg/dL Final    BUN/Creatinine Ratio 01/31/2021 19   Final    eGFR CKD-EPI (2021) Male 01/31/2021 89  >=60 mL/min/1.34m2 Final    eGFR calculated with CKD-EPI 2021 equation in accordance with [employer] and [employer] of Nephrology Task Force recommendations.    Glucose 01/31/2021 85  70 - 179 mg/dL Final    Calcium 29/56/2130 8.1 (A) 8.7 - 10.4 mg/dL Final    Albumin 86/57/8469 3.0 (A) 3.4 - 5.0 g/dL Final    Total Protein 01/31/2021 5.9  5.7 - 8.2  g/dL Final    Total Bilirubin 01/31/2021 0.5  0.3 - 1.2 mg/dL Final    AST 16/02/9603 22  <=34 U/L Final    ALT 01/31/2021 15  10 - 49 U/L Final    Alkaline Phosphatase 01/31/2021 105  46 - 116 U/L Final    Magnesium 01/31/2021 1.3 (A) 1.6 - 2.6 mg/dL Final    Phosphorus 54/01/8118 2.5  2.4 - 5.1 mg/dL Final    LDH 14/78/2956 235  120 - 246 U/L Final    Uric Acid 01/31/2021 3.9  3.7 - 9.2 mg/dL Final    WBC 21/30/8657 0.7 (A) 3.6 - 11.2 10*9/L Final    RBC 01/31/2021 2.24 (A) 4.26 - 5.60 10*12/L Final    HGB 01/31/2021 7.8 (A) 12.9 - 16.5 g/dL Final    HCT 84/69/6295 22.0 (A) 39.0 - 48.0 % Final    MCV 01/31/2021 98.0 (A) 77.6 - 95.7 fL Final    MCH 01/31/2021 34.9 (A) 25.9 - 32.4 pg Final    MCHC 01/31/2021 35.6  32.0 - 36.0 g/dL Final    RDW 28/41/3244 24.4 (A) 12.2 - 15.2 % Final    MPV 01/31/2021 8.2  6.8 - 10.7 fL Final Platelet 01/31/2021 35 (A) 150 - 450 10*9/L Final    Neutrophils % 01/31/2021 14.0  % Final    Lymphocytes % 01/31/2021 67.7  % Final    Monocytes % 01/31/2021 17.1  % Final    Eosinophils % 01/31/2021 1.2  % Final    Basophils % 01/31/2021 0.0  % Final    Absolute Neutrophils 01/31/2021 0.1 (A) 1.8 - 7.8 10*9/L Final    Absolute Lymphocytes 01/31/2021 0.5 (A) 1.1 - 3.6 10*9/L Final    Absolute Monocytes 01/31/2021 0.1 (A) 0.3 - 0.8 10*9/L Final    Absolute Eosinophils 01/31/2021 0.0  0.0 - 0.5 10*9/L Final    Absolute Basophils 01/31/2021 0.0  0.0 - 0.1 10*9/L Final    Anisocytosis 01/31/2021 Marked (A) Not Present Final

## 2021-01-31 NOTE — Unmapped (Signed)
PT in lab for PIV placement, 22 gauge placed in LAC by [facility] RN labs obtained,blood return brisk, saline locked.

## 2021-01-31 NOTE — Unmapped (Signed)
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     Encounter Date: 01/31/2021    Current Treatment: Azacitidine/venetoclax C1D1 today    Cycle 1 dosed as follows:  -Azacitidine 75 mg/m2 subcutaneous days 1-7  -Venetoclax 100 mg day 1, 200 mg day 2, 400 mg day 3 onwards continuous x 28 days    For oral chemotherapy:   Pharmacy: [facility]   Medication Access: venetoclax $0/month with grant  (Posaconazole - $0/month through [facility])     Interval History:   David Riley is doing well today and is prepared to start chemotherapy. He has venetoclax in hand today. Med rec complete. On Eliquis due to hx of PE/AF, but on ppx dosing (2.5 mg BID). Denies bleeding. Has been taking APAP prn for pain in upper back. When discussing current bowel movement patterns, he states that he has some straining. No current symptoms he is bothered by at this time. Taking allopurinol and other prescribed ppx. Noting some fatigue, but anemia not overly symptomatic.     Labs: WBC 0.7, ANC 0.1; Hgb 7.8; PLT 35; CMP with creatinine0.89 (at baseline)  Vitals: BP 117/58; HR 84    Oncologic History:  Oncology History   Acute myeloid leukemia (CMS-HCC)   01/19/2021 Initial Diagnosis    Acute myeloid leukemia (CMS-HCC)     01/31/2021 -  Chemotherapy    OP AML AZACITIDINE + VENETOCLAX  azacitidine 75 mg/m2 SQ on days 1-7, venetoclax ramp up Week 1 (dose dependent), then azacitidine 75 mg/m2 SQ on Days 1-7 every 28 days         Weight and Vitals:  Wt Readings from Last 3 Encounters:   01/24/21 83.6 kg (184 lb 6.4 oz)   01/13/21 86.2 kg (190 lb)   01/11/21 86.2 kg (190 lb)     Temp Readings from Last 3 Encounters:   01/24/21 36.8 ??C (98.2 ??F) (Oral)   01/24/21 36.8 ??C (98.3 ??F) (Oral)   01/19/21 35.9 ??C (96.6 ??F) (Oral) BP Readings from Last 3 Encounters:   01/24/21 136/69   01/24/21 123/67   01/19/21 102/58     Pulse Readings from Last 3 Encounters:   01/24/21 60   01/24/21 85   01/19/21 104       Pertinent Labs:  No visits with results within 1 Day(s) from this visit.   Latest known visit with results is:   Hospital Outpatient Visit on 01/24/2021   Component Date Value Ref Range Status   ??? Crossmatch 01/25/2021 Compatible   Final   ??? Unit Blood Type 01/25/2021 A Pos   Final   ??? ISBT Number 01/25/2021 [id number]   Final   ??? Unit # 01/25/2021 [id number]   Final   ??? Status 01/25/2021 Transfused   Final   ??? Product ID 01/25/2021 Red Blood Cells   Final   ??? PRODUCT CODE 01/25/2021 [id number]   Final   ??? Crossmatch 01/25/2021 Compatible   Final   ??? Unit Blood Type 01/25/2021 A Pos   Final   ??? ISBT Number 01/25/2021 [id number]   Final   ??? Unit # 01/25/2021 [id number]   Final   ??? Status 01/25/2021 Released to Avail   Final   ??? Spec Expiration 01/25/2021 [id number]   Final   ??? Product ID 01/25/2021  Red Blood Cells   Final   ??? PRODUCT CODE 01/25/2021 [id number]   Final       Allergies: No Known Allergies    Drug Interactions:  Strong or moderate CYP3A inhibitors or P-gp inhibitors may require adjustments of venetoclax dose; Avoid strong or moderate CYP3A inducers;   -Venetoclax target dose should be 100 mg daily upon start of strong 3A4 inhibitor posaconazole.   -Will likely plan to switch atorvastatin to pravastatin or crestor to minimize 3A4 mediated DDI with posaconazole  -Posaconazole can also increase apixaban concentrations but mostly in setting of Pgp-inh (currently on ppx dose 2.5 mg BID and ok to monitor)    Current Medications:  Current Outpatient Medications   Medication Sig Dispense Refill   ??? allopurinol (ZYLOPRIM) 300 MG tablet Take 1 tablet (300 mg total) by mouth daily. 30 tablet 2   ??? apixaban (ELIQUIS) 5 mg Tab Take 2.5 mg by mouth Two (2) times a day.     ??? atorvastatin (LIPITOR) 40 MG tablet Take 1 tablet (40 mg total) by mouth daily. 90 tablet 3   ??? finasteride (PROSCAR) 5 mg tablet Take 1 tablet (5 mg total) by mouth daily. 90 tablet 0   ??? gabapentin (NEURONTIN) 100 MG capsule Take up to 3 capsules tid or as directed. 270 capsule 3   ??? hydrocortisone 2.5 % cream Apply to face 2x/day as needed 30 g 3   ??? latanoprost (XALATAN) 0.005 % ophthalmic solution Administer 1 drop to both eyes nightly.      ??? levoFLOXacin (LEVAQUIN) 500 MG tablet Take 1 tablet (500 mg total) by mouth daily. 30 tablet 0   ??? levothyroxine (SYNTHROID) 100 MCG tablet Take 1 tablet (100 mcg total) by mouth daily. 90 tablet 0   ??? lidocaine 2% viscous (XYLOCAINE) 2 % Soln Take 15 mls by mouth every four (4) hours as needed for mouth pain. 100 mL 0   ??? metoprolol succinate (TOPROL-XL) 25 MG 24 hr tablet Take 1 tablet (25 mg total) by mouth daily. 90 tablet 3   ??? pilocarpine (SALAGEN, PILOCARPINE,) 5 MG tablet Take 1 tablet (5 mg total) by mouth Three (3) times a day. 90 tablet 11   ??? posaconazole (NOXAFIL) 100 mg delayed released tablet Take 3 tablets (300 mg total) by mouth daily. Do not start until instructed by outpatient oncology team. 90 tablet 0   ??? sertraline (ZOLOFT) 100 MG tablet Take 1 tablet (100 mg total) by mouth daily. 90 tablet 1   ??? timolol (TIMOPTIC) 0.5 % ophthalmic solution Administer 1 drop to the right eye Two (2) times a day.      ??? traZODone (DESYREL) 100 MG tablet Take 1 tablet (100 mg total) by mouth nightly. 90 tablet 3   ??? valACYclovir (VALTREX) 500 MG tablet Take 1 tablet (500 mg total) by mouth daily. 30 tablet 0   ??? venetoclax (VENCLEXTA) 100 mg tablet Take 4 tablets (400 mg total) by mouth daily. Take with a meal and water. Do not chew, crush, or break tablets. Do not start until instructed by outpatient oncology team. 120 tablet 6   ??? vit A/vit C/vit E/zinc/copper (PRESERVISION AREDS ORAL) Take by mouth Two (2) times a day.       No current facility-administered medications for this visit.       Adherence: no barriers identified Assessment: David Riley is a 75 y.o. male with new diagnosis AML - planning to start treatment outpatient with azacitidine + venetoclax. Today is C1D1  Plan:  The following was discussed with the patient today:  AML:  - Will get azacitidine x 7 days in the infusion center. Will get a pre-medication of zofran prior to each dose. This can cause constipation so feel free to start miralax as needed. Injection site pain is a common complaint.   - Each month he can stay in touch with [facility] for delivery of venetoclax refill  - Handle with caution as this is an oral chemotherapy.  - Venetoclax will be administered daily with food.   - TLS is a rare complication. This is prevented by being on allopurinol at least 48-72 hours prior to initiation of venetoclax. Should remain on this for at least C1 or until in CR. Patient should stay hydrated (6-8 glasses of water daily), and will also get IV fluids for first 4 days of therapy. We also ramp-up the venetoclax as follows:  Day 1 = 100 mg  Day 2 = 200 mg  Day 3 and beyond = 400 mg  - Side effects related to venetoclax are typically mild and include nausea, diarrhea, and fatigue. Happy to send supportive care medications as needed. (Compazine sent to local pharmacy.) Myelosuppression is common and will require at least twice weekly labs even after the first week and possible transfusions   - Should watch out for drug interactions, no grapefruit.   - We will arrange all appointments based off start date (see NN note for appointment specifics). This will include weekly CPP visits on D1, 8, 15 and prn after that.     ID ppx  - Avoid APAP/ibuprofen. Sent tramadol to local pharmacy to use prn for pain  - Continue on valtrex 500 mg daily and levaquin 500 mg daily.   - We discussed that posaconazole will likely arrive to his home this week and he should bring with him when he sees [place] next week. This is never started for ppx prior to D8 of therapy. When started, we will need to dose reduce venetoclax. I will help with this.    History of DVT and A. fib (non-occlusive DVT in 2006, PE in 2007 while on warfarin, PE in 2019 while on rivaroxaban, has been on apixaban ppx dose since)   ??- HOLD apixaban 2.5 mg BID for PLT < 50. Continue to weight benefit vs risk with hx of clots vs bleeding    CV (history of mild CAD), history of HFpEF   - Home furosemide held on previous discharge  - Likely plan to switch statin to pravastatin (due to drug interaction with posaconazole) when treatment begins & can also consider stopping altogether       F/u:  Future Appointments   Date Time Provider Department Center   01/31/2021 11:45 AM ADULT ONC LAB [facility]   01/31/2021 12:30 PM Guerry Bruin, MD [facility]   01/31/2021  1:00 PM ONCHEM LEUKEMIA PHARMACIST [facility]   01/31/2021  1:30 PM ONCINF CHAIR 07 [facility]   02/01/2021 12:00 PM ADULT ONC LAB [facility]   02/01/2021  1:00 PM ONCDEV CHAIR 55 [facility]   02/02/2021 12:00 PM ADULT ONC LAB [facility]   02/02/2021  1:00 PM ONCINF CHAIR 09 [facility]   02/03/2021 12:30 PM ONCINF CHAIR 10 [facility]   02/04/2021 12:30 PM ONCINF CHAIR 10 [facility]   02/05/2021 12:15 PM ADULT ONC LAB [facility]   02/05/2021  1:00 PM ONCHEM  LEUKEMIA PHARMACIST [facility]   02/05/2021  2:00 PM Keeven Skeeters, AGNP [facility]   02/05/2021  2:30 PM ONCINF CHAIR 48 [facility]   02/06/2021  9:00 AM ADULT ONC LAB [facility]   02/06/2021 10:00 AM ONCINF CHAIR 47 [facility]   02/09/2021  1:00 PM ADULT ONC LAB [facility]   02/09/2021  2:00 PM ONCINF CHAIR 47 [facility]   02-18-21  3:00 PM Birdie Sons, NP [facility]   03/01/2021 11:15 AM ADULT ONC LAB [facility]   02/05/2021 12:00 PM ONCHEM LEUKEMIA PHARMACIST [facility]   03/05/2021  1:30 PM Sean Marrian Salvage, AGNP [facility]   02/18/2021  2:30 PM ONCINF CHAIR 41 [facility]   02/16/2021 11:00 AM Sasha Deutsch-Link, MD [facility]   02/16/2021 12:45 PM ADULT ONC LAB [facility]   02/16/2021  1:30 PM ONCINF CHAIR 25 [facility]   02/20/2021 11:00 AM ADULT ONC LAB [facility]   02/20/2021 12:00 PM [employer], AGNP [facility]   02/20/2021  1:00 PM ONCINF CHAIR 38 [facility]   02/21/2021  8:30 AM Guerry Bruin, MD [facility]   03/01/2021  9:45 AM ADULT ONC LAB [facility]   03/01/2021 10:30 AM Doreatha Lew, MD [facility]   04/05/2021  4:00 PM Puneet Ardis Hughs, MD [facility]   04/10/2021  1:30 PM HB LAB [facility] [address] [facility]   04/10/2021  2:30 PM Francine Graven, MD [facility]   04/17/2021 10:40 AM Jenell Milliner, MD [facility]   07/12/2021 11:30 AM Gasper Sells, MD [facility]       I spent 35 minutes with David Riley in direct patient care.      Manfred Arch, PharmD, BCOP, CPP  Pager: [phone]

## 2021-01-31 NOTE — Unmapped (Signed)
[facility] Leukemia Clinic Initial Visit     Patient Name: David Riley  Patient Age: 75 y.o.  Encounter Date: 01/31/2021    Primary Care Provider:  Jenell Milliner, MD    Referring Physician:  Attending Physician Inpatient, MD  [place],  [place] [address]    Reason for visit:   Initiate therapy for AML    Assessment:  A 75 y.o. 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 Bone marrow biopsy demonstrated 43% blasts. His labs today are notable for pancytopenia and anemia. Discussed that the goal of treatment is to induce remission, improve marrow function, palliate symptoms and prevent progression rather than provide curative benefit. Discussed that the [facility] mutation indicates higher than average rates of response to venetoclax-based therapies and opens up additional options for targeted therapy such as ivosidenib if he does not respond to azacitidine-venetoclax (or if he has severe or prolonged myelosuppression). For now, he will proceed with cycle 1 of azacitidine-venetoclax.     Plan and Recommendations:  Acute Myeloid Leukemia:   - C1D1 azacitidine-venetoclax  ?? Azacitidine 75 mg/m2 subcutaneous days 1-7  ?? Venetoclax 100 mg day 1, 200 mg day 2, 400 mg day 3 onwards (will reduce to 100 mg per day when he has posaconazole and ANC <0.5)         - Allopurinol daily during first cycle and when there is active leukemia  - repeat BMBx 10/18  - RTC 10/19    Pancytopenia  - hold Eliquis while platelets are <50k  - blood transfusion tomorrow    Nephrolithiasis  - s/p urethral stent  - will need to follow up with urology to consider stent exchange or removal once he has hematologic improvement, in ~4-6 weeks most likely    Infection management: no active infection    Symptom management: symptoms reviewed and due to acceptable control, no changes made   **Pain  - tramadol PRN; avoid Tylenol  **Nausea  - Compazine PRN    Venous access: I recommend the following change for venous access PICC line insertion     Supportive Care Recommendations:  We recommend based on the patient???s underlying diagnosis and treatment history the following supportive care:      1. Antimicrobial prophylaxis:  AML (not in remission): - Bacterial: Levofloxacin 500mg  PO daily (absolute neutrophils >/= 0.5 until absolute neutrophils >/= 0.5);     - Fungal: AML fungal: Posaconazole 300mg  PO daily (absolute neutrophils >/= 0.5 until absolute neutrophils >/= 0.5);  HOLD given Venetoclax, start when available and patient having completed dose escalation of venetoclax  - Viral: Valacyclovir 500mg  PO daily (continuous)    2. Blood product support:  I recommend the following intervals for laboratory monitoring to determine transfusion needs and monitor for hematologic effects of therapy and underlying disease: twice weekly    Leukoreduced blood products are required.  Irradiated blood products are preferred, but in case of urgent transfusion needs non-irradiated blood products may be used:     -  RBC transfusion threshold: transfuse 2 units for Hgb < 8 g/dL.  -  Platelet transfusion threshold: transfuse 1 unit of platelets for platelet count < 10, or for bleeding or need for invasive procedure.    3. Hematopoietic growth factor support: none    Care coordination:   - repeat BMBx 02/20/21  - RTC 02/21/21   - Discussed that Dr.  Senaida Ores will see him after this cycle due to my departure from the institution.    These services include the following elements of medical decision making:  addressing a hematologic cancer that poses a threat to life and/or bone marrow function  interpretation of diagnostic test reports or ordering of diagnostic tests and independent interpretation of diagnostic tests  High risk of morbidity due to drug therapy requiring monitoring for toxicity or decisions regarding goals and continuation of antineoplastic therapy    ______________________________________________________________________    Documentation assistance was provided by Graceann Congress, Scribe, on 01/31/2021 at 1:38 PM for Troy Sine. Malen Gauze, MD    Documentation assistance provided by Medical Scribe, Graceann Congress, who was present during the entirety of the visit. I reviewed the note below and validated all of the information provided to ensure accuracy and completeness.     Benita Gutter, MD      Troy Sine Malen Gauze, MD  Leukemia Program  Division of Hematology/Oncology  [facility]    Nurse Navigator (non-clinical trial patients): [name], RN        Tel. [phone]       Fax. [phone]  Toll-free appointments: [phone]  Scheduling assistance: [phone]  After hours/weekends: [phone] (ask for adult hematology/oncology on-call)        History of Present Illness:  We had the pleasure of seeing David Riley in the Leukemia Clinic at the [place] [facility] [place] on 01/31/2021.  He is a 75 y.o. male with Acute Myeloid Leukemia.      Current therapy is day 1, cycle 1 azacitidine-venetoclax.    His oncologic history is as follows:      Oncology History Overview Note   Referring/Local Oncologist: None    Diagnosis: Acute Myeloid Leukemia    Genetics:   Karyotype/FISH: 11 XY     Molecular Genetics:         Variants of Known/Likely Clinical Significance:   Gene Coding Predicted Protein Variant allele fraction   ASXL1 c.1782C>A p.(Cys594Ter) 22.6 %   CSF3R c.2326C>T p.(Gln776Ter) 4.9 %   IDH1 c.394C>T p.(Arg132Cys) 12.5 %   U2AF1 c.470A>C p.(Gln157Pro) 24.6 %       Pertinent Phenotypic data: blasts express CD7, CD13, CD117, CD34, and HLADR.  They are negative for CD19, CD20, CD22, surface CD3, and other analyzed surface T-cell markers.  They are also negative for cytoplasmic MPO, CD3, and TdT.     Disease-specific prognostic estimate: ELN adverse         Acute myeloid leukemia (CMS-HCC)   01/19/2021 Initial Diagnosis    Acute myeloid leukemia (CMS-HCC)     01/31/2021 -  Chemotherapy    OP AML AZACITIDINE + VENETOCLAX  azacitidine 75 mg/m2 SQ on days 1-7, venetoclax ramp up Week 1 (dose dependent), then azacitidine 75 mg/m2 SQ on Days 1-7 every 28 days           ECOG Performance Status: 1    Medications:    Current Outpatient Medications   Medication Sig Dispense Refill   ??? allopurinol (ZYLOPRIM) 300 MG tablet Take 1 tablet (300 mg total) by mouth daily. 30 tablet 2   ??? apixaban (ELIQUIS) 5 mg Tab Take 2.5 mg by mouth Two (2) times a day. (Patient not taking: Reported on 01/31/2021)     ??? atorvastatin (LIPITOR) 40 MG tablet Take 1 tablet (40 mg total) by mouth daily. 90 tablet 3   ??? finasteride (PROSCAR) 5 mg tablet Take 1 tablet (5 mg total) by mouth daily.  90 tablet 0   ??? gabapentin (NEURONTIN) 100 MG capsule Take up to 3 capsules tid or as directed. 270 capsule 3   ??? latanoprost (XALATAN) 0.005 % ophthalmic solution Administer 1 drop to both eyes nightly.      ??? levoFLOXacin (LEVAQUIN) 500 MG tablet Take 1 tablet (500 mg total) by mouth daily. 30 tablet 0   ??? levothyroxine (SYNTHROID) 100 MCG tablet Take 1 tablet (100 mcg total) by mouth daily. 90 tablet 0   ??? lidocaine 2% viscous (XYLOCAINE) 2 % Soln Take 15 mls by mouth every four (4) hours as needed for mouth pain. 100 mL 0   ??? metoprolol succinate (TOPROL-XL) 25 MG 24 hr tablet Take 1 tablet (25 mg total) by mouth daily. 90 tablet 3   ??? pilocarpine (SALAGEN, PILOCARPINE,) 5 MG tablet Take 1 tablet (5 mg total) by mouth Three (3) times a day. 90 tablet 11   ??? sertraline (ZOLOFT) 100 MG tablet Take 1 tablet (100 mg total) by mouth daily. 90 tablet 1   ??? timolol (TIMOPTIC) 0.5 % ophthalmic solution Administer 1 drop to the right eye Two (2) times a day.      ??? traZODone (DESYREL) 100 MG tablet Take 1 tablet (100 mg total) by mouth nightly. 90 tablet 3   ??? valACYclovir (VALTREX) 500 MG tablet Take 1 tablet (500 mg total) by mouth daily. 30 tablet 0   ??? venetoclax (VENCLEXTA) 100 mg tablet Take 4 tablets (400 mg total) by mouth daily. Take with a meal and water. Do not chew, crush, or break tablets. Do not start until instructed by outpatient oncology team. 120 tablet 6   ??? vit A/vit C/vit E/zinc/copper (PRESERVISION AREDS ORAL) Take by mouth Two (2) times a day.     ??? hydrocortisone 2.5 % cream Apply to face 2x/day as needed 30 g 3   ??? posaconazole (NOXAFIL) 100 mg delayed released tablet Take 3 tablets (300 mg total) by mouth daily. Do not start until instructed by outpatient oncology team. (Patient not taking: Reported on 01/31/2021) 90 tablet 0   ??? prochlorperazine (COMPAZINE) 10 MG tablet Take 1 tablet (10 mg total) by mouth every six (6) hours as needed for nausea. 30 tablet 0   ??? traMADoL (ULTRAM) 50 mg tablet Take 1 tablet (50 mg total) by mouth every six (6) hours as needed for pain. 30 tablet 0     No current facility-administered medications for this visit.       Vital Signs:  Vitals:    01/31/21 1217   BP: 117/58   Pulse: 84   Temp: 37.2 ??C (98.9 ??F)   SpO2: 100%         Relevant Physical Exam Findings:  None    Relevant Laboratory, radiology and pathology results:  I personally viewed the most recent internal records, laboratory results, hematopathology report and cytogenetic reports  and discussed the available results with the patient and family  A summary of results follows:  Hospital Outpatient Visit on 01/31/2021   Component Date Value Ref Range Status   ??? Antibody Screen 01/31/2021 NEG   Final   ??? Blood Type 01/31/2021 AB POS   Final   Lab on 01/31/2021   Component Date Value Ref Range Status   ??? Sodium 01/31/2021 141  135 - 145 mmol/L Final   ??? Potassium 01/31/2021 3.3 (A) 3.4 - 4.8 mmol/L Final   ??? Chloride 01/31/2021 110 (A) 98 - 107 mmol/L Final   ??? CO2 01/31/2021  23.0  20.0 - 31.0 mmol/L Final   ??? Anion Gap 01/31/2021 8  5 - 14 mmol/L Final   ??? BUN 01/31/2021 17  9 - 23 mg/dL Final   ??? Creatinine 01/31/2021 0.89  0.60 - 1.10 mg/dL Final   ??? BUN/Creatinine Ratio 01/31/2021 19   Final   ??? eGFR CKD-EPI (2021) Male 01/31/2021 89  >=60 mL/min/1.68m2 Final    eGFR calculated with CKD-EPI 2021 equation in accordance with [employer] and [employer] of Nephrology Task Force recommendations.   ??? Glucose 01/31/2021 85  70 - 179 mg/dL Final   ??? Calcium 16/02/9603 8.1 (A) 8.7 - 10.4 mg/dL Final   ??? Albumin 54/01/8118 3.0 (A) 3.4 - 5.0 g/dL Final   ??? Total Protein 01/31/2021 5.9  5.7 - 8.2 g/dL Final   ??? Total Bilirubin 01/31/2021 0.5  0.3 - 1.2 mg/dL Final   ??? AST 14/78/2956 22  <=34 U/L Final   ??? ALT 01/31/2021 15  10 - 49 U/L Final   ??? Alkaline Phosphatase 01/31/2021 105  46 - 116 U/L Final   ??? Magnesium 01/31/2021 1.3 (A) 1.6 - 2.6 mg/dL Final   ??? Phosphorus 01/31/2021 2.5  2.4 - 5.1 mg/dL Final   ??? LDH 21/30/8657 235  120 - 246 U/L Final   ??? Uric Acid 01/31/2021 3.9  3.7 - 9.2 mg/dL Final   ??? WBC 84/69/6295 0.7 (A) 3.6 - 11.2 10*9/L Final   ??? RBC 01/31/2021 2.24 (A) 4.26 - 5.60 10*12/L Final   ??? HGB 01/31/2021 7.8 (A) 12.9 - 16.5 g/dL Final   ??? HCT 28/41/3244 22.0 (A) 39.0 - 48.0 % Final   ??? MCV 01/31/2021 98.0 (A) 77.6 - 95.7 fL Final   ??? MCH 01/31/2021 34.9 (A) 25.9 - 32.4 pg Final   ??? MCHC 01/31/2021 35.6  32.0 - 36.0 g/dL Final   ??? RDW 05/08/7251 24.4 (A) 12.2 - 15.2 % Final   ??? MPV 01/31/2021 8.2  6.8 - 10.7 fL Final   ??? Platelet 01/31/2021 35 (A) 150 - 450 10*9/L Final   ??? Neutrophils % 01/31/2021 14.0  % Final   ??? Lymphocytes % 01/31/2021 67.7  % Final   ??? Monocytes % 01/31/2021 17.1  % Final   ??? Eosinophils % 01/31/2021 1.2  % Final   ??? Basophils % 01/31/2021 0.0  % Final   ??? Absolute Neutrophils 01/31/2021 0.1 (A) 1.8 - 7.8 10*9/L Final   ??? Absolute Lymphocytes 01/31/2021 0.5 (A) 1.1 - 3.6 10*9/L Final   ??? Absolute Monocytes 01/31/2021 0.1 (A) 0.3 - 0.8 10*9/L Final   ??? Absolute Eosinophils 01/31/2021 0.0  0.0 - 0.5 10*9/L Final   ??? Absolute Basophils 01/31/2021 0.0  0.0 - 0.1 10*9/L Final   ??? Anisocytosis 01/31/2021 Marked (A) Not Present Final   ??? Smear Review Comments 01/31/2021 See Comment (A) Undefined Final    Blasts Present.  Agranular neutrophils present.  Instrument ID: [id number]     Diagnosis   Date Value Ref Range Status   01/19/2021   Final    Bone marrow, right iliac, aspiration and biopsy  -  Hypercellular bone marrow (60%) with persistent involvement by acute myeloid leukemia (43% blasts by manual aspirate differential).  -  See linked reports for associated Ancillary Studies.      This electronic signature is attestation that the pathologist personally reviewed the submitted material(s) and the final diagnosis reflects that evaluation.

## 2021-01-31 NOTE — Unmapped (Signed)
Patient arrived to chair 2.  No complaints noted.  Access of piv intact with blood return. No replacement for mag or potassium per Laurier Nancy, NP.  Type and screen drawn for transfusion tomorrow. Patient completed and tolerated treatment.  Patient declined to keep piv intact for tomorrow.  AVS declined and patient discharged to home.

## 2021-02-01 ENCOUNTER — Ambulatory Visit: Admit: 2021-02-01 | Discharge: 2021-02-02 | Payer: MEDICARE

## 2021-02-01 ENCOUNTER — Other Ambulatory Visit: Admit: 2021-02-01 | Discharge: 2021-02-02 | Payer: MEDICARE

## 2021-02-01 DIAGNOSIS — C9200 Acute myeloblastic leukemia, not having achieved remission: Principal | ICD-10-CM

## 2021-02-01 DIAGNOSIS — Z9289 Personal history of other medical treatment: Principal | ICD-10-CM

## 2021-02-01 DIAGNOSIS — Z79899 Other long term (current) drug therapy: Principal | ICD-10-CM

## 2021-02-01 LAB — CBC W/ AUTO DIFF
BASOPHILS ABSOLUTE COUNT: 0 10*9/L (ref 0.0–0.1)
BASOPHILS RELATIVE PERCENT: 1.5 %
EOSINOPHILS ABSOLUTE COUNT: 0 10*9/L (ref 0.0–0.5)
EOSINOPHILS RELATIVE PERCENT: 3.5 %
HEMATOCRIT: 23 % — ABNORMAL LOW (ref 39.0–48.0)
HEMOGLOBIN: 8.2 g/dL — ABNORMAL LOW (ref 12.9–16.5)
LYMPHOCYTES ABSOLUTE COUNT: 0.3 10*9/L — ABNORMAL LOW (ref 1.1–3.6)
LYMPHOCYTES RELATIVE PERCENT: 67.4 %
MEAN CORPUSCULAR HEMOGLOBIN CONC: 35.5 g/dL (ref 32.0–36.0)
MEAN CORPUSCULAR HEMOGLOBIN: 35 pg — ABNORMAL HIGH (ref 25.9–32.4)
MEAN CORPUSCULAR VOLUME: 98.4 fL — ABNORMAL HIGH (ref 77.6–95.7)
MEAN PLATELET VOLUME: 7.4 fL (ref 6.8–10.7)
MONOCYTES ABSOLUTE COUNT: 0 10*9/L — ABNORMAL LOW (ref 0.3–0.8)
MONOCYTES RELATIVE PERCENT: 9.9 %
NEUTROPHILS ABSOLUTE COUNT: 0.1 10*9/L — CL (ref 1.8–7.8)
NEUTROPHILS RELATIVE PERCENT: 17.7 %
PLATELET COUNT: 32 10*9/L — ABNORMAL LOW (ref 150–450)
RED BLOOD CELL COUNT: 2.34 10*12/L — ABNORMAL LOW (ref 4.26–5.60)
RED CELL DISTRIBUTION WIDTH: 25.1 % — ABNORMAL HIGH (ref 12.2–15.2)
WBC ADJUSTED: 0.5 10*9/L — ABNORMAL LOW (ref 3.6–11.2)

## 2021-02-01 LAB — LACTATE DEHYDROGENASE: LACTATE DEHYDROGENASE: 233 U/L (ref 120–246)

## 2021-02-01 LAB — COMPREHENSIVE METABOLIC PANEL
ALBUMIN: 3.4 g/dL (ref 3.4–5.0)
ALKALINE PHOSPHATASE: 118 U/L — ABNORMAL HIGH (ref 46–116)
ALT (SGPT): 15 U/L (ref 10–49)
ANION GAP: 8 mmol/L (ref 5–14)
AST (SGOT): 20 U/L (ref <=34)
BILIRUBIN TOTAL: 0.7 mg/dL (ref 0.3–1.2)
BLOOD UREA NITROGEN: 17 mg/dL (ref 9–23)
BUN / CREAT RATIO: 18
CALCIUM: 8.7 mg/dL (ref 8.7–10.4)
CHLORIDE: 108 mmol/L — ABNORMAL HIGH (ref 98–107)
CO2: 23 mmol/L (ref 20.0–31.0)
CREATININE: 0.94 mg/dL
EGFR CKD-EPI (2021) MALE: 85 mL/min/{1.73_m2} (ref >=60)
GLUCOSE RANDOM: 99 mg/dL (ref 70–179)
POTASSIUM: 3.4 mmol/L (ref 3.4–4.8)
PROTEIN TOTAL: 6.4 g/dL (ref 5.7–8.2)
SODIUM: 139 mmol/L (ref 135–145)

## 2021-02-01 LAB — PHOSPHORUS: PHOSPHORUS: 2.9 mg/dL (ref 2.4–5.1)

## 2021-02-01 LAB — MAGNESIUM: MAGNESIUM: 1.4 mg/dL — ABNORMAL LOW (ref 1.6–2.6)

## 2021-02-01 LAB — SLIDE REVIEW

## 2021-02-01 LAB — URIC ACID: URIC ACID: 4.7 mg/dL

## 2021-02-01 MED ADMIN — sodium chloride 0.9% (NS) bolus 500 mL: 500 mL | INTRAVENOUS | @ 18:00:00 | Stop: 2021-02-01

## 2021-02-01 MED ADMIN — ondansetron (ZOFRAN) tablet 8 mg: 8 mg | ORAL | @ 18:00:00 | Stop: 2021-02-01

## 2021-02-01 MED ADMIN — azaCITIDine (VIDAZA) syringe: 75 mg/m2 | SUBCUTANEOUS | @ 19:00:00 | Stop: 2021-02-01

## 2021-02-01 NOTE — Unmapped (Signed)
Addended by: [name] A on: 02/01/2021 12:50 PM     Modules accepted: Orders

## 2021-02-01 NOTE — Unmapped (Unsigned)
PIV inserted by Yisroel Ramming, RN. Labs drawn and sent.

## 2021-02-01 NOTE — Unmapped (Signed)
Unable to reach patient for pre-consult prior to appointment.

## 2021-02-01 NOTE — Unmapped (Signed)
The patient arrived to unit stable and ambulatory. Medications administered as ordered, patient tolerated well.  The patient departed clinic stable and ambulatory. No blood today per patient.

## 2021-02-01 NOTE — Unmapped (Addendum)
As directed by Dr. Malen Gauze, I have contacted the adult Vascular Access Team to review/clarify the following:    ?? During evaluation in the clinic setting on Wednesday, January 31, 2021, Dr. Malen Gauze has determined that David Riley is in need of a temporary vascular access solution in conjunction with his current chemotherapy regimen.    ?? The appropriate order has been placed and the adult Vascular Access team has been notified of the request.  ?? Unfortunately, the Adult Vascular Access Team was able to confirm that there services are only available to patients who are admitted and or scheduled/coordinated for a scheduled admission.  ?? Therefore, PICC line placement will need to be coordinated with the interventional radiology team.    ?? The necessary orders have been placed for the interventional radiology team to coordinate PICC line placement as requested.     Otherwise, the patient and his wife are both aware of this request and have no other questions or concerns at the present time.  They are to call the clinic back sooner should there be any other issues.    No other actions taken and this updates been shared with the Leukemia Care Team as well.

## 2021-02-02 NOTE — Unmapped (Signed)
[facility] Navigation: Barrier Assessment    Summary: Pt is a 75 yo male living in [place] with his wife. He presents in good spirits although reported a distress score of 8. After some investigation, pt acknowledges situational anxiety surrounding the unknown and requested more detail re: what the plan is. OPN communicated with team as appropriate.     Practical/Logistical:   MyChart user: No - pt's wife says she just needs to do it  Transportation issues: No  Lodging needs: Yes - pt familiar with the [employer] however their neighbors are providing assistance, so they're holding off  Mobility concerns: No however acknowledged recent decline in functioning; OPN educated pt on Cancer Rehab Services to which he and his wife are interested - referral placed  Form literacy: Does not require assistance  Education/Referrals/Interventions: [facility]     Psychosocial:   Distress Score: 8; OPN educated pt on available services including SW for immediate intervention however pt declined  Healthy coping mechanisms: Yes - watching TV and spending time with his wife  Positive support system: Yes  Caregiver concerns: No  Education/Referrals/Interventions:  [facility] and Coping with Cancer; pt denied interest at this time     Financial:     Patient's perceived financial toxicity: Not present  Food insecurity: Not present  Difficulty paying for medication: No  Education/Referrals/Interventions: Not needed at this time    Medical/Home:    Understanding of dx/treatment plan: No - I don't know what goes on beyond these 7 days; OPN sent in basket to Vick Frees, NN to address questions  Unreported tx side effects/symptoms: No  Issues getting medications: No  Nutrition or appetite concerns: No  Education/Referrals/Interventions: Triage line/after hours and educated on importance of self-advocacy     Advance Care Planning:   Advance Directives on file: Yes  Education/Referrals/Interventions: Already on file     Supportive Communication:  Preferred method: Email and Phone  Availability: No preference  Connected to: [facility] Listserv    Additional External Resources: LLS    Navigation Follow-up Plan: (2-5) Will provide 2 follow-up visits.    Patient verbalized understanding of information provided and is in agreement with discussed Navigation Plan. OPN provided Navigation Program's contact information for patient to utilize as needed.     Call Duration: 15 min

## 2021-02-02 NOTE — Unmapped (Signed)
Hi,     David Riley contacted the Communication Center regarding the following:    - States that they will not come to the appt on 09/30 due to weather, but had to canc labs and wanted to know if the labs could be done during tomorrows appt     Please contact David Riley at [phone].    Thanks in advance,    Iona Hansen  [facility] Communication Center   [phone]

## 2021-02-03 ENCOUNTER — Encounter: Admit: 2021-02-03 | Discharge: 2021-02-04 | Payer: MEDICARE | Attending: Adult Health | Primary: Adult Health

## 2021-02-03 ENCOUNTER — Ambulatory Visit: Admit: 2021-02-03 | Discharge: 2021-02-04 | Payer: MEDICARE

## 2021-02-03 LAB — CBC W/ AUTO DIFF
BASOPHILS ABSOLUTE COUNT: 0 10*9/L (ref 0.0–0.1)
BASOPHILS RELATIVE PERCENT: 0 %
EOSINOPHILS ABSOLUTE COUNT: 0 10*9/L (ref 0.0–0.5)
EOSINOPHILS RELATIVE PERCENT: 1.2 %
HEMATOCRIT: 17.2 % — ABNORMAL LOW (ref 39.0–48.0)
HEMOGLOBIN: 6 g/dL — ABNORMAL LOW (ref 12.9–16.5)
LYMPHOCYTES ABSOLUTE COUNT: 0.2 10*9/L — ABNORMAL LOW (ref 1.1–3.6)
LYMPHOCYTES RELATIVE PERCENT: 58.1 %
MEAN CORPUSCULAR HEMOGLOBIN CONC: 35.1 g/dL (ref 32.0–36.0)
MEAN CORPUSCULAR HEMOGLOBIN: 34.4 pg — ABNORMAL HIGH (ref 25.9–32.4)
MEAN CORPUSCULAR VOLUME: 98.1 fL — ABNORMAL HIGH (ref 77.6–95.7)
MEAN PLATELET VOLUME: 7.5 fL (ref 6.8–10.7)
MONOCYTES ABSOLUTE COUNT: 0 10*9/L — ABNORMAL LOW (ref 0.3–0.8)
MONOCYTES RELATIVE PERCENT: 7.3 %
NEUTROPHILS ABSOLUTE COUNT: 0.1 10*9/L — CL (ref 1.8–7.8)
NEUTROPHILS RELATIVE PERCENT: 33.4 %
PLATELET COUNT: 20 10*9/L — ABNORMAL LOW (ref 150–450)
RED BLOOD CELL COUNT: 1.76 10*12/L — ABNORMAL LOW (ref 4.26–5.60)
RED CELL DISTRIBUTION WIDTH: 24.3 % — ABNORMAL HIGH (ref 12.2–15.2)
WBC ADJUSTED: 0.3 10*9/L — CL (ref 3.6–11.2)

## 2021-02-03 LAB — COMPREHENSIVE METABOLIC PANEL
ALBUMIN: 2.7 g/dL — ABNORMAL LOW (ref 3.4–5.0)
ALKALINE PHOSPHATASE: 98 U/L (ref 46–116)
ALT (SGPT): 11 U/L (ref 10–49)
ANION GAP: 7 mmol/L (ref 5–14)
AST (SGOT): 17 U/L (ref <=34)
BILIRUBIN TOTAL: 0.9 mg/dL (ref 0.3–1.2)
BLOOD UREA NITROGEN: 21 mg/dL (ref 9–23)
BUN / CREAT RATIO: 19
CALCIUM: 8.3 mg/dL — ABNORMAL LOW (ref 8.7–10.4)
CHLORIDE: 104 mmol/L (ref 98–107)
CO2: 25 mmol/L (ref 20.0–31.0)
CREATININE: 1.08 mg/dL
EGFR CKD-EPI (2021) MALE: 72 mL/min/{1.73_m2} (ref >=60)
GLUCOSE RANDOM: 91 mg/dL (ref 70–179)
POTASSIUM: 3.6 mmol/L (ref 3.4–4.8)
PROTEIN TOTAL: 5.6 g/dL — ABNORMAL LOW (ref 5.7–8.2)
SODIUM: 136 mmol/L (ref 135–145)

## 2021-02-03 LAB — LACTATE DEHYDROGENASE: LACTATE DEHYDROGENASE: 193 U/L (ref 120–246)

## 2021-02-03 LAB — MAGNESIUM: MAGNESIUM: 1.5 mg/dL — ABNORMAL LOW (ref 1.6–2.6)

## 2021-02-03 LAB — SLIDE REVIEW

## 2021-02-03 LAB — URIC ACID: URIC ACID: 5.1 mg/dL

## 2021-02-03 LAB — PHOSPHORUS: PHOSPHORUS: 2.3 mg/dL — ABNORMAL LOW (ref 2.4–5.1)

## 2021-02-03 MED ADMIN — ondansetron (ZOFRAN) tablet 8 mg: 8 mg | ORAL | @ 19:00:00 | Stop: 2021-02-03

## 2021-02-03 MED ADMIN — sodium chloride 0.9% (NS) bolus 500 mL: 500 mL | INTRAVENOUS | @ 19:00:00 | Stop: 2021-02-03

## 2021-02-03 MED ADMIN — magnesium oxide (MAG-OX) tablet 800 mg: 800 mg | ORAL | @ 19:00:00 | Stop: 2021-02-03

## 2021-02-03 MED ADMIN — azaCITIDine (VIDAZA) syringe: 75 mg/m2 | SUBCUTANEOUS | @ 20:00:00 | Stop: 2021-02-03

## 2021-02-03 NOTE — Unmapped (Signed)
Hospital Outpatient Visit on 02/03/2021   Component Date Value Ref Range Status    Uric Acid 02/03/2021 5.1  3.7 - 9.2 mg/dL Final    LDH 45/40/9811 193  120 - 246 U/L Final    Phosphorus 02/03/2021 2.3 (A) 2.4 - 5.1 mg/dL Final    Magnesium 91/47/8295 1.5 (A) 1.6 - 2.6 mg/dL Final    Sodium 62/13/0865 136  135 - 145 mmol/L Final    Potassium 02/03/2021 3.6  3.4 - 4.8 mmol/L Final    Chloride 02/03/2021 104  98 - 107 mmol/L Final    CO2 02/03/2021 25.0  20.0 - 31.0 mmol/L Final    Anion Gap 02/03/2021 7  5 - 14 mmol/L Final    BUN 02/03/2021 21  9 - 23 mg/dL Final    Creatinine 78/46/9629 1.08  0.60 - 1.10 mg/dL Final    BUN/Creatinine Ratio 02/03/2021 19   Final    eGFR CKD-EPI (2021) Male 02/03/2021 72  >=60 mL/min/1.57m2 Final    eGFR calculated with CKD-EPI 2021 equation in accordance with [employer] of Nephrology Task Force recommendations.    Glucose 02/03/2021 91  70 - 179 mg/dL Final    Calcium 52/84/1324 8.3 (A) 8.7 - 10.4 mg/dL Final    Albumin 40/02/2724 2.7 (A) 3.4 - 5.0 g/dL Final    Total Protein 02/03/2021 5.6 (A) 5.7 - 8.2 g/dL Final    Total Bilirubin 02/03/2021 0.9  0.3 - 1.2 mg/dL Final    AST 36/64/4034 17  <=34 U/L Final    ALT 02/03/2021 11  10 - 49 U/L Final    Alkaline Phosphatase 02/03/2021 98  46 - 116 U/L Final    Antibody Screen 02/03/2021 NEG   Final    Blood Type 02/03/2021 AB POS   Final    WBC 02/03/2021 0.3 (A) 3.6 - 11.2 10*9/L Final    WBC count insufficient for precise differential.     RBC 02/03/2021 1.76 (A) 4.26 - 5.60 10*12/L Final    HGB 02/03/2021 6.0 (A) 12.9 - 16.5 g/dL Final    HCT 74/25/9563 17.2 (A) 39.0 - 48.0 % Final    MCV 02/03/2021 98.1 (A) 77.6 - 95.7 fL Final    MCH 02/03/2021 34.4 (A) 25.9 - 32.4 pg Final    MCHC 02/03/2021 35.1  32.0 - 36.0 g/dL Final    RDW 87/56/4332 24.3 (A) 12.2 - 15.2 % Final    MPV 02/03/2021 7.5  6.8 - 10.7 fL Final    Platelet 02/03/2021 20 (A) 150 - 450 10*9/L Final    Neutrophils % 02/03/2021 33.4  % Final    Lymphocytes % 02/03/2021 58.1  % Final    Monocytes % 02/03/2021 7.3  % Final    Eosinophils % 02/03/2021 1.2  % Final    Basophils % 02/03/2021 0.0  % Final    Absolute Neutrophils 02/03/2021 0.1 (A) 1.8 - 7.8 10*9/L Final    Absolute Lymphocytes 02/03/2021 0.2 (A) 1.1 - 3.6 10*9/L Final    Absolute Monocytes 02/03/2021 0.0 (A) 0.3 - 0.8 10*9/L Final    Absolute Eosinophils 02/03/2021 0.0  0.0 - 0.5 10*9/L Final    Absolute Basophils 02/03/2021 0.0  0.0 - 0.1 10*9/L Final    Anisocytosis 02/03/2021 Marked (A) Not Present Final    Smear Review Comments 02/03/2021 See Comment (A) Undefined Final    Slide reviewed  Blasts Present-rare  Agranular neutrophils present.  Instrument ID: [id number]      Dohle  Bodies 02/03/2021 Present (A) Not Present Final    Toxic Vacuolation 02/03/2021 Present (A) Not Present Final    Crossmatch 02/03/2021 Compatible   Final    Unit Blood Type 02/03/2021 O Pos   Final    ISBT Number 02/03/2021 5100   Final    Unit # 02/03/2021 [id number]   Final    Status 02/03/2021 Released to Avail   Final    Spec Expiration 02/03/2021 [id number]   Final    Product ID 02/03/2021 Red Blood Cells   Final    PRODUCT CODE 02/03/2021 [id number]   Final    Crossmatch 02/03/2021 Compatible   Final    Unit Blood Type 02/03/2021 A Pos   Final    ISBT Number 02/03/2021 6200   Final    Unit # 02/03/2021 [id number]   Final    Status 02/03/2021 Ready   Final    Spec Expiration 02/03/2021 [id number]   Final    Product ID 02/03/2021 Red Blood Cells   Final    PRODUCT CODE 02/03/2021 [id number]   Final    Crossmatch 02/03/2021 Compatible   Final    Unit Blood Type 02/03/2021 A Pos   Final    ISBT Number 02/03/2021 6200   Final    Unit # 02/03/2021 [id number]   Final    Status 02/03/2021 Ready   Final    Spec Expiration 02/03/2021 [id number]   Final    Product ID 02/03/2021 Red Blood Cells   Final    PRODUCT CODE 02/03/2021 [id number]   Final

## 2021-02-03 NOTE — Unmapped (Signed)
Patient arrived to chair 4 with wife.  Pt complaints of severe fatigue, new onset weakness and difficulty walking. Provider assessed (see note). Mg repleted. Vidaza subcu administered. Pt needs 2 units PRBCs d/t Hg 6. Unfortunately unable to transfuse d/t time constraints. Pt will return tomorrow for transfusions. All questions answered and concerns addressed. Pt discharged stable to home.

## 2021-02-03 NOTE — Unmapped (Addendum)
Brief Oncology Note    Patient comes to oncology infusion clinic for D3C1 Aza + Venetoclax and c/o weakness, lethargy, fatigue. He states he's mostly been in his recliner, resting since starting this cycle. He attempted to use the bathroom last night and found he was profoundly weak and felt unsteady on his feet. He states he's eating well but only drinking about 2 glasses of water a day. Patient was found to be profoundly anemic, therefore will give 2U PRBC. He verifies he is continuing to hold his Eliquis (last dose Weds 9/28). We also discussed deconditioning, dehydration and overall fatigue related to treatment. APP encouraged him to stay as active as possible, avoid excessive resting in the recliner or bed and utilizing exercises he has from recent PT/OT therapy at home.    Unfortunately, due to time constraints, T&S and PRBC were not available by the time of clinic close today, therefore will give IVF and have patient RTC early tomorrow for PRBCs.       Vitals:    02/03/21 1228   BP: 97/53   Pulse: 81   Resp: 18   Temp: 36.9 ??C (98.4 ??F)   TempSrc: Oral   Weight: 87.3 kg (192 lb 7.4 oz)     Lab Results   Component Value Date    WBC 0.3 (LL) 02/03/2021    HGB 6.0 (L) 02/03/2021    HCT 17.2 (L) 02/03/2021    PLT 20 (L) 02/03/2021       Lab Results   Component Value Date    NA 136 02/03/2021    K 3.6 02/03/2021    CL 104 02/03/2021    CO2 25.0 02/03/2021    BUN 21 02/03/2021    CREATININE 1.08 02/03/2021    GLU 91 02/03/2021    CALCIUM 8.3 (L) 02/03/2021    MG 1.5 (L) 02/03/2021    PHOS 2.3 (L) 02/03/2021       Lab Results   Component Value Date    BILITOT 0.9 02/03/2021    BILIDIR 0.20 07/06/2017    PROT 5.6 (L) 02/03/2021    ALBUMIN 2.7 (L) 02/03/2021    ALT 11 02/03/2021    AST 17 02/03/2021    ALKPHOS 98 02/03/2021    GGT 38 06/23/2012       Lab Results   Component Value Date    PT 14.1 (H) 01/19/2021    PT 14.9 (H) 01/19/2021    INR 1.24 01/19/2021    INR 1.30 01/19/2021    APTT 33.1 01/19/2021 Erlinda Hong, PA   Hematology Oncology Infusion Advanced Practice Provider

## 2021-02-04 ENCOUNTER — Ambulatory Visit: Admit: 2021-02-04 | Discharge: 2021-02-05 | Payer: MEDICARE

## 2021-02-04 LAB — CBC W/ AUTO DIFF
BASOPHILS ABSOLUTE COUNT: 0 10*9/L (ref 0.0–0.1)
BASOPHILS RELATIVE PERCENT: 1.4 %
EOSINOPHILS ABSOLUTE COUNT: 0 10*9/L (ref 0.0–0.5)
EOSINOPHILS RELATIVE PERCENT: 3.3 %
HEMATOCRIT: 17.5 % — ABNORMAL LOW (ref 39.0–48.0)
HEMOGLOBIN: 6.1 g/dL — ABNORMAL LOW (ref 12.9–16.5)
LYMPHOCYTES ABSOLUTE COUNT: 0.2 10*9/L — ABNORMAL LOW (ref 1.1–3.6)
LYMPHOCYTES RELATIVE PERCENT: 58.9 %
MEAN CORPUSCULAR HEMOGLOBIN CONC: 34.9 g/dL (ref 32.0–36.0)
MEAN CORPUSCULAR HEMOGLOBIN: 34.5 pg — ABNORMAL HIGH (ref 25.9–32.4)
MEAN CORPUSCULAR VOLUME: 98.9 fL — ABNORMAL HIGH (ref 77.6–95.7)
MEAN PLATELET VOLUME: 8.2 fL (ref 6.8–10.7)
MONOCYTES ABSOLUTE COUNT: 0 10*9/L — ABNORMAL LOW (ref 0.3–0.8)
MONOCYTES RELATIVE PERCENT: 1.5 %
NEUTROPHILS ABSOLUTE COUNT: 0.1 10*9/L — CL (ref 1.8–7.8)
NEUTROPHILS RELATIVE PERCENT: 34.9 %
PLATELET COUNT: 22 10*9/L — ABNORMAL LOW (ref 150–450)
RED BLOOD CELL COUNT: 1.77 10*12/L — ABNORMAL LOW (ref 4.26–5.60)
RED CELL DISTRIBUTION WIDTH: 24 % — ABNORMAL HIGH (ref 12.2–15.2)
WBC ADJUSTED: 0.3 10*9/L — CL (ref 3.6–11.2)

## 2021-02-04 LAB — SLIDE REVIEW

## 2021-02-04 MED ADMIN — ondansetron (ZOFRAN) tablet 8 mg: 8 mg | ORAL | @ 15:00:00 | Stop: 2021-02-04

## 2021-02-04 MED ADMIN — cetirizine (ZyrTEC) tablet 10 mg: 10 mg | ORAL | @ 15:00:00 | Stop: 2021-02-04

## 2021-02-04 MED ADMIN — azaCITIDine (VIDAZA) syringe: 75 mg/m2 | SUBCUTANEOUS | @ 16:00:00 | Stop: 2021-02-04

## 2021-02-04 MED ADMIN — acetaminophen (TYLENOL) tablet 650 mg: 650 mg | ORAL | @ 15:00:00 | Stop: 2021-02-04

## 2021-02-04 NOTE — Unmapped (Addendum)
Hospital Outpatient Visit on 02/04/2021   Component Date Value Ref Range Status    WBC 02/04/2021 0.3 (A) 3.6 - 11.2 10*9/L Final    RBC 02/04/2021 1.77 (A) 4.26 - 5.60 10*12/L Final    HGB 02/04/2021 6.1 (A) 12.9 - 16.5 g/dL Final    HCT 16/02/9603 17.5 (A) 39.0 - 48.0 % Final    MCV 02/04/2021 98.9 (A) 77.6 - 95.7 fL Final    MCH 02/04/2021 34.5 (A) 25.9 - 32.4 pg Final    MCHC 02/04/2021 34.9  32.0 - 36.0 g/dL Final    RDW 54/01/8118 24.0 (A) 12.2 - 15.2 % Final    MPV 02/04/2021 8.2  6.8 - 10.7 fL Final    Platelet 02/04/2021 22 (A) 150 - 450 10*9/L Final    Neutrophils % 02/04/2021 34.9  % Final    Lymphocytes % 02/04/2021 58.9  % Final    Monocytes % 02/04/2021 1.5  % Final    Eosinophils % 02/04/2021 3.3  % Final    Basophils % 02/04/2021 1.4  % Final    Absolute Neutrophils 02/04/2021 0.1 (A) 1.8 - 7.8 10*9/L Final    Absolute Lymphocytes 02/04/2021 0.2 (A) 1.1 - 3.6 10*9/L Final    Absolute Monocytes 02/04/2021 0.0 (A) 0.3 - 0.8 10*9/L Final    Absolute Eosinophils 02/04/2021 0.0  0.0 - 0.5 10*9/L Final    Absolute Basophils 02/04/2021 0.0  0.0 - 0.1 10*9/L Final    Anisocytosis 02/04/2021 Marked (A) Not Present Final    Smear Review Comments 02/04/2021 See Comment (A) Undefined Final    Slide reviewed  Agranular neutrophils present.  Instrument ID: [id number]    Dohle Bodies 02/04/2021 Present (A) Not Present Final    Toxic Vacuolation 02/04/2021 Present (A) Not Present Final    Crossmatch 02/04/2021 Compatible   Final    Unit Blood Type 02/04/2021 A Pos   Final    ISBT Number 02/04/2021 [id number]   Final    Unit # 02/04/2021 [id number]   Final    Status 02/04/2021 Issued   Final    Spec Expiration 02/04/2021 [id number]   Final    Product ID 02/04/2021 Red Blood Cells   Final    PRODUCT CODE 02/04/2021 [id number]   Final    Crossmatch 02/04/2021 Compatible   Final    Unit Blood Type 02/04/2021 A Pos   Final    ISBT Number 02/04/2021 [id number]   Final    Unit # 02/04/2021 [id number]   Final    Status 02/04/2021 Issued   Final    Spec Expiration 02/04/2021 [id number]   Final    Product ID 02/04/2021 Red Blood Cells   Final    PRODUCT CODE 02/04/2021 [id number]   Final        Patient Education        Learning About Blood Transfusions  What is a blood transfusion?     Blood transfusion is a medical treatment to replace the blood or parts of blood that your body has lost. The blood goes through a tube from a bag to an intravenous (IV) catheter and into your vein.  You may need a blood transfusion after losing blood from an injury, a major surgery, an illness that causes bleeding, or an illness that destroys blood cells.  Transfusions are also used to give you the parts of blood--such as platelets, plasma, or substances that cause clotting--that your body needs to fight an illness or stop bleeding.  How is a blood transfusion  done?  Before you receive a blood transfusion, your blood is tested to find out what your blood type is. Blood or blood parts that are a match with your blood type are ordered by your doctor. Blood is typed as A, B, AB, or O. It is also typed as Rh-positive or Rh-negative.  Your blood is also screened to look for antibodies that might react with the blood that is given to you. The blood you are getting is checked and rechecked to make sure that it's the right type for you.  A sample of your blood is mixed with a sample of the blood you will receive to check for problems. Before actually giving you the transfusion, a doctor and nurses will look at the label on the package of blood and compare it to your hospital ID bracelet and medical records. The transfusion begins only when all agree that this is the correct blood and that you are the correct person to receive it.  To receive the transfusion, you will have an intravenous (IV) catheter inserted into a vein. A tube connects the catheter to the bag containing the blood, which is placed higher than your body. The blood then flows slowly into your vein. A doctor or nurse will check you several times during the transfusion to watch for a reaction or other problems.  What are the possible risks?  Blood transfusions have many benefits and are often life-saving. But they also have a few risks. Possible risks include:  Your body's reaction to receiving new blood. This may include:  Fever.  Breathing problems.  Allergic reaction, such as hives, swelling, or a new rash.  An infection from the blood. This risk is small because of the strict rules placed on handling and storing blood. Getting a viral infection, such as HIV or hepatitis B or C, through blood transfusions has become very rare. The [place] Food and Drug Administration (FDA) enforces strict guidelines on the collection, testing, storage, and use of blood.  Getting the wrong blood type by accident. Severe reactions, which can be life-threatening, are very rare.  An infection at the transfusion site, such as redness, swelling, pain, bleeding, or pus.  How can you care for yourself at home?  To prevent infection at the transfusion site  Wash the area daily with warm, soapy water, and pat it dry. Don't use hydrogen peroxide or alcohol, which can slow healing. You may cover the area with a gauze bandage if it weeps or rubs against clothing. Change the bandage every day.  Keep the area clean and dry.  When should you call for help?  Call 911 anytime you think you may need emergency care. For example, call if:  You have severe trouble breathing.  Call your doctor now or seek immediate medical care if:  You have signs of an allergic reaction, such as hives, swelling, or a new rash.  You have a fever.  You feel weaker or more tired than usual.  You have a yellow tint (jaundice) to your skin or the whites of your eyes.  You have nausea, vomiting, or diarrhea.  You have signs of an infection at the transfusion site, such as redness, swelling, pain, bleeding, or pus.  Watch closely for changes in your health, and be sure to contact your doctor if you have any problems.  Follow-up care is a key part of your treatment and safety. Be sure to make and go to all appointments, and call your doctor if  you are having problems. It's also a good idea to know your test results and keep a list of the medicines you take.  Where can you learn more?  Go to [facility] at [email or web address]support[employer][email or web address] [email or web address] in the Menu. Enter V588 in the search box to learn more about Learning About Blood Transfusions.  Current as of: April 03, 2020               Content Version: 13.4  ?? 2006-2022 [employer].   Care instructions adapted under license by [facility]. If you have questions about a medical condition or this instruction, always ask your healthcare professional. [employer] disclaims any warranty or liability for your use of this information.

## 2021-02-04 NOTE — Unmapped (Signed)
Patient arrived to chair 15.  No complaints noted. PIV inserted at chairside without complication, labs drawn and sent for analysis. Access of PIV intact with blood return. Pre-medicated per treatment plan orders. Pt to receive 2u PRBCs, hydration and azacitadine subcutaneous injections during today's visit. Pt tolerated subcutaneous injections in lower left abdominal region without complication. Pt tolerated transfusion without complication. PIV checked for blood return, flushed, and removed. AVS printed. Patient left infusion center, NAD.

## 2021-02-05 ENCOUNTER — Ambulatory Visit: Admit: 2021-02-05 | Discharge: 2021-02-06 | Payer: MEDICARE

## 2021-02-05 ENCOUNTER — Other Ambulatory Visit: Admit: 2021-02-05 | Discharge: 2021-02-06 | Payer: MEDICARE

## 2021-02-05 ENCOUNTER — Ambulatory Visit: Admit: 2021-02-05 | Discharge: 2021-02-06 | Payer: MEDICARE | Attending: Adult Health | Primary: Adult Health

## 2021-02-05 DIAGNOSIS — C9200 Acute myeloblastic leukemia, not having achieved remission: Principal | ICD-10-CM

## 2021-02-05 DIAGNOSIS — D61818 Other pancytopenia: Principal | ICD-10-CM

## 2021-02-05 LAB — COMPREHENSIVE METABOLIC PANEL
ALBUMIN: 2.5 g/dL — ABNORMAL LOW (ref 3.4–5.0)
ALKALINE PHOSPHATASE: 103 U/L (ref 46–116)
ALT (SGPT): 12 U/L (ref 10–49)
ANION GAP: 6 mmol/L (ref 5–14)
AST (SGOT): 18 U/L (ref <=34)
BILIRUBIN TOTAL: 0.9 mg/dL (ref 0.3–1.2)
BLOOD UREA NITROGEN: 25 mg/dL — ABNORMAL HIGH (ref 9–23)
BUN / CREAT RATIO: 25
CALCIUM: 8.2 mg/dL — ABNORMAL LOW (ref 8.7–10.4)
CHLORIDE: 108 mmol/L — ABNORMAL HIGH (ref 98–107)
CO2: 26 mmol/L (ref 20.0–31.0)
CREATININE: 1.01 mg/dL
EGFR CKD-EPI (2021) MALE: 78 mL/min/{1.73_m2} (ref >=60)
GLUCOSE RANDOM: 101 mg/dL (ref 70–179)
POTASSIUM: 3.7 mmol/L (ref 3.5–5.1)
PROTEIN TOTAL: 5.1 g/dL — ABNORMAL LOW (ref 5.7–8.2)
SODIUM: 140 mmol/L (ref 135–145)

## 2021-02-05 LAB — CBC W/ AUTO DIFF
BASOPHILS ABSOLUTE COUNT: 0 10*9/L (ref 0.0–0.1)
BASOPHILS RELATIVE PERCENT: 0 %
EOSINOPHILS ABSOLUTE COUNT: 0 10*9/L (ref 0.0–0.5)
EOSINOPHILS RELATIVE PERCENT: 2.8 %
HEMATOCRIT: 22.1 % — ABNORMAL LOW (ref 39.0–48.0)
HEMOGLOBIN: 7.9 g/dL — ABNORMAL LOW (ref 12.9–16.5)
LYMPHOCYTES ABSOLUTE COUNT: 0.1 10*9/L — ABNORMAL LOW (ref 1.1–3.6)
LYMPHOCYTES RELATIVE PERCENT: 60.4 %
MEAN CORPUSCULAR HEMOGLOBIN CONC: 35.8 g/dL (ref 32.0–36.0)
MEAN CORPUSCULAR HEMOGLOBIN: 33.6 pg — ABNORMAL HIGH (ref 25.9–32.4)
MEAN CORPUSCULAR VOLUME: 94 fL (ref 77.6–95.7)
MEAN PLATELET VOLUME: 7.3 fL (ref 6.8–10.7)
MONOCYTES ABSOLUTE COUNT: 0.1 10*9/L — ABNORMAL LOW (ref 0.3–0.8)
MONOCYTES RELATIVE PERCENT: 23.2 %
NEUTROPHILS ABSOLUTE COUNT: 0 10*9/L — CL (ref 1.8–7.8)
NEUTROPHILS RELATIVE PERCENT: 13.6 %
PLATELET COUNT: 21 10*9/L — ABNORMAL LOW (ref 150–450)
RED BLOOD CELL COUNT: 2.36 10*12/L — ABNORMAL LOW (ref 4.26–5.60)
RED CELL DISTRIBUTION WIDTH: 18.6 % — ABNORMAL HIGH (ref 12.2–15.2)
WBC ADJUSTED: 0.2 10*9/L — CL (ref 3.6–11.2)

## 2021-02-05 LAB — RETICULOCYTES
RETICULOCYTE ABSOLUTE COUNT: 12.7 10*9/L — ABNORMAL LOW (ref 23.0–100.0)
RETICULOCYTE COUNT PCT: 0.54 % (ref 0.50–2.17)

## 2021-02-05 LAB — LACTATE DEHYDROGENASE: LACTATE DEHYDROGENASE: 203 U/L (ref 120–246)

## 2021-02-05 LAB — PHOSPHORUS: PHOSPHORUS: 2.2 mg/dL — ABNORMAL LOW (ref 2.4–5.1)

## 2021-02-05 LAB — HAPTOGLOBIN: HAPTOGLOBIN: 196 mg/dL (ref 40–280)

## 2021-02-05 LAB — URIC ACID: URIC ACID: 5.1 mg/dL

## 2021-02-05 LAB — MAGNESIUM: MAGNESIUM: 1.6 mg/dL (ref 1.6–2.6)

## 2021-02-05 MED ORDER — VALACYCLOVIR 500 MG TABLET
ORAL_TABLET | Freq: Every day | ORAL | 3 refills | 90 days | Status: CP
Start: 2021-02-05 — End: ?

## 2021-02-05 MED ORDER — LEVOFLOXACIN 500 MG TABLET
ORAL_TABLET | ORAL | 0 refills | 30.00000 days | Status: CP
Start: 2021-02-05 — End: 2021-03-07

## 2021-02-05 MED ORDER — FUROSEMIDE 20 MG TABLET
ORAL_TABLET | Freq: Every day | ORAL | 0 refills | 7 days | Status: CP | PRN
Start: 2021-02-05 — End: 2022-02-05
  Filled 2021-02-05: qty 7, 7d supply, fill #0

## 2021-02-05 MED ORDER — POTASSIUM CHLORIDE ER 10 MEQ TABLET,EXTENDED RELEASE(PART/CRYST)
ORAL_TABLET | Freq: Every day | ORAL | 0 refills | 15 days | Status: CP | PRN
Start: 2021-02-05 — End: 2022-02-05
  Filled 2021-02-05: qty 30, 15d supply, fill #0

## 2021-02-05 MED ORDER — VENETOCLAX 100 MG TABLET
ORAL_TABLET | Freq: Every day | ORAL | 0 refills | 30 days | Status: CP
Start: 2021-02-05 — End: ?

## 2021-02-05 MED ORDER — PRAVASTATIN 40 MG TABLET
ORAL_TABLET | Freq: Every evening | ORAL | 11 refills | 30 days | Status: CP
Start: 2021-02-05 — End: 2022-02-05

## 2021-02-05 NOTE — Unmapped (Signed)
Pt arrived to room 25. No complaints. No access. Pt tolerated treatment with no complaints. AVS given. Pt discharged to home.

## 2021-02-05 NOTE — Unmapped (Signed)
Clinical Assessment Needed For: Dose Change  Medication: Venclexta 100mg   Last Fill Date/Day Supply: 01-29-21 / 30  Refill Too Soon until 02/21/21  Was previous dose already scheduled to fill: No    Notes to Pharmacist:

## 2021-02-05 NOTE — Unmapped (Addendum)
Starting today after supper, you will START posaconazole 3 tablets (300 mg) once daily. You will also DOSE REDUCE the venetoclax to just 1 tablet (100 mg) once daily.     Please STOP atorvastatin. I replaced this with pravastatin once daily to avoid a drug interaction.    Please continue other medications for prevention including allopurinol, levofloxacin, and valacyclovir     It's ok to take tramadol AS NEEDED up to every 6 hours.     Lab on 02/05/2021   Component Date Value Ref Range Status    Reticulocyte Auto % 02/05/2021 0.54  0.50 - 2.17 % Final    Absolute Auto Reticulocyte 02/05/2021 12.7 (A) 23.0 - 100.0 10*9/L Final    LDH 02/05/2021 203  120 - 246 U/L Final    Uric Acid 02/05/2021 5.1  3.7 - 9.2 mg/dL Final    Magnesium 01/75/1025 1.6  1.6 - 2.6 mg/dL Final    Sodium 85/27/7824 140  135 - 145 mmol/L Final    Potassium 02/05/2021 3.7  3.5 - 5.1 mmol/L Final    Chloride 02/05/2021 108 (A) 98 - 107 mmol/L Final    CO2 02/05/2021 26.0  20.0 - 31.0 mmol/L Final    Anion Gap 02/05/2021 6  5 - 14 mmol/L Final    BUN 02/05/2021 25 (A) 9 - 23 mg/dL Final    Creatinine 23/53/6144 1.01  0.60 - 1.10 mg/dL Final    BUN/Creatinine Ratio 02/05/2021 25   Final    eGFR CKD-EPI (2021) Male 02/05/2021 78  >=60 mL/min/1.91m2 Final    eGFR calculated with CKD-EPI 2021 equation in accordance with [employer] and [employer] of Nephrology Task Force recommendations.    Glucose 02/05/2021 101  70 - 179 mg/dL Final    Calcium 31/54/0086 8.2 (A) 8.7 - 10.4 mg/dL Final    Albumin 76/19/5093 2.5 (A) 3.4 - 5.0 g/dL Final    Total Protein 02/05/2021 5.1 (A) 5.7 - 8.2 g/dL Final    Total Bilirubin 02/05/2021 0.9  0.3 - 1.2 mg/dL Final    AST 26/71/2458 18  <=34 U/L Final    ALT 02/05/2021 12  10 - 49 U/L Final    Alkaline Phosphatase 02/05/2021 103  46 - 116 U/L Final    Phosphorus 02/05/2021 2.2 (A) 2.4 - 5.1 mg/dL Final    WBC 09/98/3382 0.2 (A) 3.6 - 11.2 10*9/L Final    RBC 02/05/2021 2.36 (A) 4.26 - 5.60 10*12/L Final    HGB 02/05/2021 7.9 (A) 12.9 - 16.5 g/dL Final    HCT 50/53/9767 22.1 (A) 39.0 - 48.0 % Final    MCV 02/05/2021 94.0  77.6 - 95.7 fL Final    MCH 02/05/2021 33.6 (A) 25.9 - 32.4 pg Final    MCHC 02/05/2021 35.8  32.0 - 36.0 g/dL Final    RDW 34/19/3790 18.6 (A) 12.2 - 15.2 % Final    MPV 02/05/2021 7.3  6.8 - 10.7 fL Final    Platelet 02/05/2021 21 (A) 150 - 450 10*9/L Final    Neutrophils % 02/05/2021 13.6  % Final    Lymphocytes % 02/05/2021 60.4  % Final    Monocytes % 02/05/2021 23.2  % Final    Eosinophils % 02/05/2021 2.8  % Final    Basophils % 02/05/2021 0.0  % Final    Absolute Neutrophils 02/05/2021 0.0 (A) 1.8 - 7.8 10*9/L Final    Absolute Lymphocytes 02/05/2021 0.1 (A) 1.1 - 3.6 10*9/L Final    Absolute Monocytes 02/05/2021  0.1 (A) 0.3 - 0.8 10*9/L Final    Absolute Eosinophils 02/05/2021 0.0  0.0 - 0.5 10*9/L Final    Absolute Basophils 02/05/2021 0.0  0.0 - 0.1 10*9/L Final    Anisocytosis 02/05/2021 Slight (A) Not Present Final

## 2021-02-05 NOTE — Unmapped (Signed)
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     Encounter Date: 02/05/2021    Current Treatment: Azacitidine/venetoclax C1D6 today    Cycle 1 dosed as follows:  -Azacitidine 75 mg/m2 subcutaneous days 1-7  -Venetoclax 100 mg day 1, 200 mg day 2, 400 mg day 3 onwards continuous x 28 days    For oral chemotherapy:   Pharmacy: [facility]   Medication Access: venetoclax $0/month with grant  (Posaconazole - $0/month through [facility])     Interval History: David Riley is here today after the first week of being on aza/ven therapy. He has a variety of symptoms, many of which have been chronic problems for him. He overall did ok with week 1 of therapy, but states he has been feeling weak requiring a wheel chair to get about during his appointments which is not his baseline. He did miss one day of azacitidine due to weather with plans to make up on 10/5 to complete 7 days. He explains that he continues to have pain through his shoulders/back, and stopped APAP as instructed but has been taking tramadol every 6 hours. Poor historian, challenging to understand if this is helping but he states it may be. He explains that his legs feel really heavy and notable for significant edema, he is not sure if this is PN vs discomfort from edema. Not on diuretic. Has not bought compression stockings yet. Not taking gabapentin consistently, but has not helped in the past. Complains about diarrhea, which has improved with imodium. He had diarrhea for about 3 days. This has caused what sounds like an external hemorrhoid but no significant bleeding, applied hydrocortisone to the area with relief. His main complain lately has been the blood in his urine and discomfort if the uretal stent which was supposed to have been removed but has been delayed due to AML dx. He has posaconazole in hand which arrived in the mail and unknowingly took 1 tablet this morning.    Labs: WBC 0.2, ANC 0.0; Hgb 7.9; PLT 35; CMP with creatinine 1.01 and BUN 25, Phos low at 2.2  Vitals: BP 96/63 and HR 86    Oncologic History:  Oncology History Overview Note   Referring/Local Oncologist: None    Diagnosis: Acute Myeloid Leukemia    Genetics:   Karyotype/FISH: 75 XY     Molecular Genetics:         Variants of Known/Likely Clinical Significance:   Gene Coding Predicted Protein Variant allele fraction   ASXL1 c.1782C>A p.(Cys594Ter) 22.6 %   CSF3R c.2326C>T p.(Gln776Ter) 4.9 %   IDH1 c.394C>T p.(Arg132Cys) 12.5 %   U2AF1 c.470A>C p.(Gln157Pro) 24.6 %       Pertinent Phenotypic data: blasts express CD7, CD13, CD117, CD34, and HLADR.  They are negative for CD19, CD20, CD22, surface CD3, and other analyzed surface T-cell markers.  They are also negative for cytoplasmic MPO, CD3, and TdT.     Disease-specific prognostic estimate: ELN adverse         Acute myeloid leukemia (CMS-HCC)   01/19/2021 Initial Diagnosis    Acute myeloid leukemia (CMS-HCC)     01/31/2021 -  Chemotherapy    OP AML AZACITIDINE + VENETOCLAX  azacitidine 75 mg/m2 SQ on days 1-7, venetoclax ramp up Week 1 (dose dependent), then  azacitidine 75 mg/m2 SQ on Days 1-7 every 28 days         Weight and Vitals:  Wt Readings from Last 3 Encounters:   02/06/21 87 kg (191 lb 12.8 oz)   02/05/21 87 kg (191 lb 14.4 oz)   02/04/21 87.5 kg (192 lb 14.4 oz)     Temp Readings from Last 3 Encounters:   02/06/21 36.8 ??C (98.2 ??F) (Oral)   02/05/21 36.7 ??C (98.1 ??F) (Oral)   02/05/21 37.2 ??C (98.9 ??F) (Temporal)     BP Readings from Last 3 Encounters:   02/06/21 102/57   02/05/21 112/55   02/05/21 96/63     Pulse Readings from Last 3 Encounters:   02/06/21 105   02/05/21 68   02/05/21 86       Pertinent Labs:  Lab on 02/05/2021   Component Date Value Ref Range Status   ??? Haptoglobin 02/05/2021 196  40 - 280 mg/dL Final   ??? Reticulocyte Auto % 02/05/2021 0.54  0.50 - 2.17 % Final   ??? Absolute Auto Reticulocyte 02/05/2021 12.7 (A) 23.0 - 100.0 10*9/L Final   ??? LDH 02/05/2021 203  120 - 246 U/L Final   ??? Uric Acid 02/05/2021 5.1  3.7 - 9.2 mg/dL Final   ??? Magnesium 54/01/8118 1.6  1.6 - 2.6 mg/dL Final   ??? Sodium 14/78/2956 140  135 - 145 mmol/L Final   ??? Potassium 02/05/2021 3.7  3.5 - 5.1 mmol/L Final   ??? Chloride 02/05/2021 108 (A) 98 - 107 mmol/L Final   ??? CO2 02/05/2021 26.0  20.0 - 31.0 mmol/L Final   ??? Anion Gap 02/05/2021 6  5 - 14 mmol/L Final   ??? BUN 02/05/2021 25 (A) 9 - 23 mg/dL Final   ??? Creatinine 02/05/2021 1.01  0.60 - 1.10 mg/dL Final   ??? BUN/Creatinine Ratio 02/05/2021 25   Final   ??? eGFR CKD-EPI (2021) Male 02/05/2021 78  >=60 mL/min/1.60m2 Final    eGFR calculated with CKD-EPI 2021 equation in accordance with [employer] and [employer] of Nephrology Task Force recommendations.   ??? Glucose 02/05/2021 101  70 - 179 mg/dL Final   ??? Calcium 21/30/8657 8.2 (A) 8.7 - 10.4 mg/dL Final   ??? Albumin 84/69/6295 2.5 (A) 3.4 - 5.0 g/dL Final   ??? Total Protein 02/05/2021 5.1 (A) 5.7 - 8.2 g/dL Final   ??? Total Bilirubin 02/05/2021 0.9  0.3 - 1.2 mg/dL Final   ??? AST 28/41/3244 18  <=34 U/L Final   ??? ALT 02/05/2021 12  10 - 49 U/L Final   ??? Alkaline Phosphatase 02/05/2021 103  46 - 116 U/L Final   ??? Phosphorus 02/05/2021 2.2 (A) 2.4 - 5.1 mg/dL Final   ??? WBC 05/08/7251 0.2 (A) 3.6 - 11.2 10*9/L Final   ??? RBC 02/05/2021 2.36 (A) 4.26 - 5.60 10*12/L Final   ??? HGB 02/05/2021 7.9 (A) 12.9 - 16.5 g/dL Final   ??? HCT 66/44/0347 22.1 (A) 39.0 - 48.0 % Final   ??? MCV 02/05/2021 94.0  77.6 - 95.7 fL Final   ??? MCH 02/05/2021 33.6 (A) 25.9 - 32.4 pg Final   ??? MCHC 02/05/2021 35.8  32.0 - 36.0 g/dL Final   ??? RDW 42/59/5638 18.6 (A) 12.2 - 15.2 % Final   ??? MPV 02/05/2021 7.3  6.8 - 10.7 fL Final   ??? Platelet 02/05/2021 21 (A) 150 - 450 10*9/L Final   ??? Neutrophils % 02/05/2021 13.6  % Final   ???  Lymphocytes % 02/05/2021 60.4  % Final   ??? Monocytes % 02/05/2021 23.2  % Final   ??? Eosinophils % 02/05/2021 2.8  % Final   ??? Basophils % 02/05/2021 0.0  % Final   ??? Absolute Neutrophils 02/05/2021 0.0 (A) 1.8 - 7.8 10*9/L Final   ??? Absolute Lymphocytes 02/05/2021 0.1 (A) 1.1 - 3.6 10*9/L Final   ??? Absolute Monocytes 02/05/2021 0.1 (A) 0.3 - 0.8 10*9/L Final   ??? Absolute Eosinophils 02/05/2021 0.0  0.0 - 0.5 10*9/L Final   ??? Absolute Basophils 02/05/2021 0.0  0.0 - 0.1 10*9/L Final   ??? Anisocytosis 02/05/2021 Slight (A) Not Present Final       Allergies: No Known Allergies    Drug Interactions:  Strong or moderate CYP3A inhibitors or P-gp inhibitors may require adjustments of venetoclax dose; Avoid strong or moderate CYP3A inducers;   -Venetoclax target dose should be 100 mg daily upon start of strong 3A4 inhibitor posaconazole.   -Will switch atorvastatin to pravastatin to minimize 3A4 mediated DDI with posaconazole  -Posaconazole can also increase apixaban concentrations but mostly in setting of Pgp-inh (currently on ppx dose 2.5 mg BID and ok to monitor) - holding now but caution if restarted    Current Medications:  Current Outpatient Medications   Medication Sig Dispense Refill   ??? allopurinol (ZYLOPRIM) 300 MG tablet Take 1 tablet (300 mg total) by mouth daily. 30 tablet 2   ??? finasteride (PROSCAR) 5 mg tablet Take 1 tablet (5 mg total) by mouth daily. 90 tablet 0   ??? gabapentin (NEURONTIN) 100 MG capsule Take up to 3 capsules tid or as directed. 270 capsule 3   ??? hydrocortisone 2.5 % cream Apply to face 2x/day as needed 30 g 3   ??? latanoprost (XALATAN) 0.005 % ophthalmic solution Administer 1 drop to both eyes nightly.      ??? levothyroxine (SYNTHROID) 100 MCG tablet Take 1 tablet (100 mcg total) by mouth daily. 90 tablet 0   ??? metoprolol succinate (TOPROL-XL) 25 MG 24 hr tablet Take 1 tablet (25 mg total) by mouth daily. 90 tablet 3   ??? pilocarpine (SALAGEN, PILOCARPINE,) 5 MG tablet Take 1 tablet (5 mg total) by mouth Three (3) times a day. 90 tablet 11   ??? posaconazole (NOXAFIL) 100 mg delayed released tablet Take 3 tablets (300 mg total) by mouth daily. Do not start until instructed by outpatient oncology team. 90 tablet 0   ??? prochlorperazine (COMPAZINE) 10 MG tablet Take 1 tablet (10 mg total) by mouth every six (6) hours as needed for nausea. 30 tablet 0   ??? sertraline (ZOLOFT) 100 MG tablet Take 1 tablet (100 mg total) by mouth daily. 90 tablet 1   ??? timolol (TIMOPTIC) 0.5 % ophthalmic solution Administer 1 drop to the right eye Two (2) times a day.      ??? traZODone (DESYREL) 100 MG tablet Take 1 tablet (100 mg total) by mouth nightly. 90 tablet 3   ??? vit A/vit C/vit E/zinc/copper (PRESERVISION AREDS ORAL) Take by mouth Two (2) times a day.     ??? furosemide (LASIX) 20 MG tablet Take 1 tablet (20 mg total) by mouth daily as needed for swelling (STOP if weight decreased 5 pounds). 7 tablet 0   ??? levoFLOXacin (LEVAQUIN) 500 MG tablet Take 1 tablet (500 mg total) by mouth daily. 30 tablet 0   ??? lidocaine 2% viscous (XYLOCAINE) 2 % Soln Take 15 mls by mouth every four (4) hours as  needed for mouth pain. 100 mL 0   ??? potassium chloride (KLOR-CON) 10 MEQ CR tablet Take 2 tablets (20 mEq total) by mouth daily as needed (Take Daily IF you take Lasix same day). 30 tablet 0   ??? pravastatin (PRAVACHOL) 40 MG tablet Take 1 tablet (40 mg total) by mouth every evening. 30 tablet 11   ??? [START ON 02/07/2021] traMADoL (ULTRAM) 50 mg tablet Take 1 tablet (50 mg total) by mouth every six (6) hours as needed for pain. 120 tablet 0   ??? valACYclovir (VALTREX) 500 MG tablet Take 1 tablet (500 mg total) by mouth daily. 90 tablet 3   ??? venetoclax (VENCLEXTA) 100 mg tablet Take 1 tablet (100 mg total) by mouth daily. Take with a meal and water. Do not chew, crush, or break tablets. Do not start until instructed by outpatient oncology team. 30 tablet 0     No current facility-administered medications for this visit.     Facility-Administered Medications Ordered in Other Visits   Medication Dose Route Frequency Provider Last Rate Last Admin   ??? azaCITIDine (VIDAZA) syringe  75 mg/m2 (Treatment Plan Recorded) Subcutaneous Once Guerry Bruin, MD       ??? OKAY TO SEND MEDICATION/CHEMOTHERAPY TO OUTPATIENT UNIT   Other Once Guerry Bruin, MD       ??? ondansetron [facility]) tablet 8 mg  8 mg Oral Once Guerry Bruin, MD           Adherence: no barriers identified         Assessment: DavidPerezperez is a 75 y.o. male with new diagnosis AML. Today is C1D6, making up last dose of aza on 10/5    Plan:  AML:  - Dose REDUCE venetoclax to 100 mg daily with the initiation of posaconazole  - Continue allopurinol 300 mg daily  - Add infusion visit for D7 of aza on 10/5  - Will change transfusion frequency to three times per week due to requiring significant transfusion needs for PRBC     ID ppx  - Continue on valtrex 500 mg daily and levaquin 500 mg daily.   - START posaconazole 300 mg (3 tablets) once daily with venetoclax dose reduction as described above    History of DVT and A. fib (non-occlusive DVT in 2006, PE in 2007 while on warfarin, PE in 2019 while on rivaroxaban, has been on apixaban ppx dose since)   ??- HOLD apixaban 2.5 mg BID for PLT < 50. Continue to weigh benefit vs risk with hx of clots vs bleeding  - Continues on metoprolol, watch BP    CV (history of mild CAD), history of HFpEF   - STOP atorvastatin and START pravastatin 40 mg daily.     Supportive Care:  - START furosemide 20 mg daily (STOP if weight decreased by 5 lb). Take potassium 20 mEq on days he takes furosemide.  - Avoid APAP/ibuprofen. Sent refill for more tramadol but encouraged to take only PRN   - Ok to use imodium for diarrhea    F/u:  Future Appointments   Date Time Provider Department Center   02/07/2021  9:00 AM ADULT ONC LAB [facility]   02/07/2021 10:00 AM ONCINF CHAIR 21 [facility]   02/09/2021  1:00 PM ADULT ONC LAB [facility] 02/09/2021  2:00 PM ONCINF CHAIR 47 [facility]   03/08/21  3:00 PM Birdie Sons, NP [facility]   02/12/2021 11:15 AM ADULT ONC  LAB [facility]   02-26-2021 12:00 PM ONCHEM LEUKEMIA PHARMACIST [facility]   02-26-2021  1:30 PM Sean Bane Hagy, AGNP [facility]   02-26-21  2:30 PM ONCINF CHAIR 41 [facility]     2:00 PM [facility] VIR RM 1 [id number] [place] [facility]   02/16/2021 11:00 AM Sasha Deutsch-Link, MD [facility]   02/16/2021 12:45 PM ADULT ONC LAB [facility]   02/16/2021  1:30 PM ONCINF CHAIR 25 [facility]   02/20/2021 11:00 AM ADULT ONC LAB [facility]   02/20/2021 12:00 PM [employer], AGNP [facility]   02/20/2021  1:00 PM ONCINF CHAIR 38 [facility]   02/21/2021  8:30 AM Guerry Bruin, MD [facility]   03/01/2021  9:45 AM ADULT ONC LAB [facility]   03/01/2021 10:30 AM Doreatha Lew, MD [facility]   04/05/2021  4:00 PM Puneet Ardis Hughs, MD [facility]   04/10/2021  1:30 PM [facility] LAB [facility] [address] [facility]   04/10/2021  2:30 PM Francine Graven, MD [facility]   04/17/2021 10:40 AM Jenell Milliner, MD [facility]   07/12/2021 11:30 AM Gasper Sells, MD [facility]       I spent 35 minutes with DavidButner in direct patient care.      Manfred Arch, PharmD, BCOP, CPP  Pager: [phone]

## 2021-02-05 NOTE — Unmapped (Signed)
Peripheral stick done by Rowe Pavy, 23g L AC, labs drawn and sent.

## 2021-02-05 NOTE — Unmapped (Signed)
Patient to not receive transfusion today per Langley Gauss NP, patient will return 10/5 for lab check and possible PRBC.

## 2021-02-05 NOTE — Unmapped (Signed)
START taking Lasix (furosemide) today. Take 1 tablet when you get home.  Take 2 tablets of POTASSIUM when you take Lasix.  If your weight decreased 5 lbs, STOP the Lasix and potassium.    See the notes from the pharmacy team for the venetoclax  1 Tablet of Venetoclax daily  3 Tablets of Posaconazole daily

## 2021-02-05 NOTE — Unmapped (Signed)
Lab on 02/05/2021   Component Date Value Ref Range Status    Haptoglobin 02/05/2021 196  40 - 280 mg/dL Final    Reticulocyte Auto % 02/05/2021 0.54  0.50 - 2.17 % Final    Absolute Auto Reticulocyte 02/05/2021 12.7 (A) 23.0 - 100.0 10*9/L Final    LDH 02/05/2021 203  120 - 246 U/L Final    Uric Acid 02/05/2021 5.1  3.7 - 9.2 mg/dL Final    Magnesium 16/02/9603 1.6  1.6 - 2.6 mg/dL Final    Sodium 54/01/8118 140  135 - 145 mmol/L Final    Potassium 02/05/2021 3.7  3.5 - 5.1 mmol/L Final    Chloride 02/05/2021 108 (A) 98 - 107 mmol/L Final    CO2 02/05/2021 26.0  20.0 - 31.0 mmol/L Final    Anion Gap 02/05/2021 6  5 - 14 mmol/L Final    BUN 02/05/2021 25 (A) 9 - 23 mg/dL Final    Creatinine 14/78/2956 1.01  0.60 - 1.10 mg/dL Final    BUN/Creatinine Ratio 02/05/2021 25   Final    eGFR CKD-EPI (2021) Male 02/05/2021 78  >=60 mL/min/1.6m2 Final    eGFR calculated with CKD-EPI 2021 equation in accordance with [employer] and [employer] of Nephrology Task Force recommendations.    Glucose 02/05/2021 101  70 - 179 mg/dL Final    Calcium 21/30/8657 8.2 (A) 8.7 - 10.4 mg/dL Final    Albumin 84/69/6295 2.5 (A) 3.4 - 5.0 g/dL Final    Total Protein 02/05/2021 5.1 (A) 5.7 - 8.2 g/dL Final    Total Bilirubin 02/05/2021 0.9  0.3 - 1.2 mg/dL Final    AST 28/41/3244 18  <=34 U/L Final    ALT 02/05/2021 12  10 - 49 U/L Final    Alkaline Phosphatase 02/05/2021 103  46 - 116 U/L Final    Phosphorus 02/05/2021 2.2 (A) 2.4 - 5.1 mg/dL Final    WBC 05/08/7251 0.2 (A) 3.6 - 11.2 10*9/L Final    RBC 02/05/2021 2.36 (A) 4.26 - 5.60 10*12/L Final    HGB 02/05/2021 7.9 (A) 12.9 - 16.5 g/dL Final    HCT 66/44/0347 22.1 (A) 39.0 - 48.0 % Final    MCV 02/05/2021 94.0  77.6 - 95.7 fL Final    MCH 02/05/2021 33.6 (A) 25.9 - 32.4 pg Final    MCHC 02/05/2021 35.8  32.0 - 36.0 g/dL Final    RDW 42/59/5638 18.6 (A) 12.2 - 15.2 % Final    MPV 02/05/2021 7.3  6.8 - 10.7 fL Final    Platelet 02/05/2021 21 (A) 150 - 450 10*9/L Final    Neutrophils % 02/05/2021 13.6  % Final    Lymphocytes % 02/05/2021 60.4  % Final    Monocytes % 02/05/2021 23.2  % Final    Eosinophils % 02/05/2021 2.8  % Final    Basophils % 02/05/2021 0.0  % Final    Absolute Neutrophils 02/05/2021 0.0 (A) 1.8 - 7.8 10*9/L Final    Absolute Lymphocytes 02/05/2021 0.1 (A) 1.1 - 3.6 10*9/L Final    Absolute Monocytes 02/05/2021 0.1 (A) 0.3 - 0.8 10*9/L Final    Absolute Eosinophils 02/05/2021 0.0  0.0 - 0.5 10*9/L Final    Absolute Basophils 02/05/2021 0.0  0.0 - 0.1 10*9/L Final    Anisocytosis 02/05/2021 Slight (A) Not Present Final

## 2021-02-06 ENCOUNTER — Ambulatory Visit: Admit: 2021-02-06 | Discharge: 2021-02-07 | Payer: MEDICARE

## 2021-02-06 MED ADMIN — ondansetron (ZOFRAN) tablet 8 mg: 8 mg | ORAL | @ 15:00:00 | Stop: 2021-02-06

## 2021-02-06 MED ADMIN — azaCITIDine (VIDAZA) syringe: 75 mg/m2 | SUBCUTANEOUS | @ 15:00:00 | Stop: 2021-02-06

## 2021-02-06 NOTE — Unmapped (Signed)
VENOUS ACCESS TEAM PROCEDURE    Order was placed for a PIV by Venous Access Team (VAT).  Patient was assessed at bedside for placement of a PIV. PPE were donned per protocol.  Access was obtained. Blood return noted.  Dressing intact and device well secured.  Flushed with normal saline.  See LDA for details.  Pt advised to inform RN of any s/s of discomfort at the PIV site.    Workup / Procedure Time:  15 minutes       primary care  RN was notified.       Thank you,     Cyndie Mull RN Venous Access Team

## 2021-02-06 NOTE — Unmapped (Signed)
[facility] Leukemia Clinic Follow Up Visit     Patient Name: David Riley  Patient Age: 75 y.o.  Encounter Date: 02/05/2021    Primary Care Provider:  Jenell Milliner, MD    Referring Physician:  Lenon Ahmadi, AGNP  [address]  [id number]  [place],  [place] [address]    Reason for visit:   Initiate therapy for AML    Assessment:  A 75 y.o. 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 Bone marrow biopsy demonstrated 43% blasts.     Today he is Day 5 Cycle 1 azacitidine-venetoclax. Hemoglobin is improved today s/p 2 units PRBCs yesterday. He is fluid overloaded. Will initiate diuresis today and defer further transfusion until Wednesday.    He and his wife will require significant support with managing the medications in the regimen. Will likely keep on posaconazole until ANC is clearly stable and remaining above 0.5      Plan and Recommendations:  Acute Myeloid Leukemia: IDH1+, could consider ivosidenib in future  - C1D5 azacitidine-venetoclax  ?? Azacitidine 75 mg/m2 subcutaneous days 1-7  ?? Venetoclax 100 mg daily Days 1-28 (dose-reduced on posaconazole)        - Allopurinol daily during first cycle and when there is active leukemia  - repeat BMBx 10/18 - orders in place  - RTC weekly to see CPP/APP    Fluid Overload  - Lasix 20mg  daily, with Potassium 20 mEq  - weigh self daily, stop when weight is down 5 lbs    Pancytopenia  - hold Eliquis while platelets are <50k  - blood transfusion tomorrow    Nephrolithiasis  - s/p urethral stent  - will need to follow up with urology to consider stent exchange or removal once he has hematologic improvement, in ~4-6 weeks most likely    Infection management: no active infection    Symptom management: symptoms reviewed and due to acceptable control, no changes made   **Pain  - tramadol PRN; avoid Tylenol  **Nausea  - Compazine PRN    Venous access: I recommend the following change for venous access PICC line insertion     Supportive Care Recommendations:  We recommend based on the patient???s underlying diagnosis and treatment history the following supportive care:      1. Antimicrobial prophylaxis:  AML (not in remission): - Bacterial: Levofloxacin 500mg  PO daily (absolute neutrophils >/= 0.5 until absolute neutrophils >/= 0.5);     - Fungal: AML fungal: Posaconazole 300mg  PO daily (absolute neutrophils >/= 0.5 until absolute neutrophils >/= 0.5);   - Viral: Valacyclovir 500mg  PO daily (continuous)    2. Blood product support:  I recommend the following intervals for laboratory monitoring to determine transfusion needs and monitor for hematologic effects of therapy and underlying disease: twice weekly    Leukoreduced blood products are required.  Irradiated blood products are preferred, but in case of urgent transfusion needs non-irradiated blood products may be used:     -  RBC transfusion threshold: transfuse 2 units for Hgb < 8 g/dL.  -  Platelet transfusion threshold: transfuse 1 unit of platelets for platelet count < 10, or for bleeding or need for invasive procedure.    3. Hematopoietic growth factor support: none    Care coordination:   - repeat BMBx 02/20/21  - RTC weekly  - Discussed that Dr. Senaida Ores will  see him after this cycle due to my departure from the institution.    These services include the following elements of medical decision making:  addressing a hematologic cancer that poses a threat to life and/or bone marrow function  interpretation of diagnostic test reports or ordering of diagnostic tests and independent interpretation of diagnostic tests  High risk of morbidity due to drug therapy requiring monitoring for toxicity or decisions regarding goals and continuation of antineoplastic therapy    ______________________________________________________________________    Langley Gauss, AGPCNP-BC  Nurse Practitioner  Hematology/Oncology  [facility]    Troy Sine. Malen Gauze, MD was available  Leukemia Program  Division of Hematology/Oncology  [facility]    Nurse Navigator (non-clinical trial patients): [name], RN        Tel. [phone]       Fax. [phone]  Toll-free appointments: [phone]  Scheduling assistance: [phone]  After hours/weekends: [phone] (ask for adult hematology/oncology on-call)        History of Present Illness:  We had the pleasure of seeing David Riley in the Leukemia Clinic at the [facility] on 02/05/2021.  He is a 75 y.o. male with Acute Myeloid Leukemia.        His oncologic history is as follows:      Oncology History Overview Note   Referring/Local Oncologist: None    Diagnosis: Acute Myeloid Leukemia    Genetics:   Karyotype/FISH: 66 XY     Molecular Genetics:         Variants of Known/Likely Clinical Significance:   Gene Coding Predicted Protein Variant allele fraction   ASXL1 c.1782C>A p.(Cys594Ter) 22.6 %   CSF3R c.2326C>T p.(Gln776Ter) 4.9 %   IDH1 c.394C>T p.(Arg132Cys) 12.5 %   U2AF1 c.470A>C p.(Gln157Pro) 24.6 %       Pertinent Phenotypic data: blasts express CD7, CD13, CD117, CD34, and HLADR.  They are negative for CD19, CD20, CD22, surface CD3, and other analyzed surface T-cell markers.  They are also negative for cytoplasmic MPO, CD3, and TdT.     Disease-specific prognostic estimate: ELN adverse         Acute myeloid leukemia (CMS-HCC)   01/19/2021 Initial Diagnosis    Acute myeloid leukemia (CMS-HCC)     01/31/2021 -  Chemotherapy    OP AML AZACITIDINE + VENETOCLAX  azacitidine 75 mg/m2 SQ on days 1-7, venetoclax ramp up Week 1 (dose dependent), then azacitidine 75 mg/m2 SQ on Days 1-7 every 28 days           ECOG Performance Status: 2    Medications:    Current Outpatient Medications   Medication Sig Dispense Refill   ??? allopurinol (ZYLOPRIM) 300 MG tablet Take 1 tablet (300 mg total) by mouth daily. 30 tablet 2   ??? finasteride (PROSCAR) 5 mg tablet Take 1 tablet (5 mg total) by mouth daily. 90 tablet 0   ??? furosemide (LASIX) 20 MG tablet Take 1 tablet (20 mg total) by mouth daily as needed for swelling (STOP if weight decreased 5 pounds). 7 tablet 0   ??? gabapentin (NEURONTIN) 100 MG capsule Take up to 3 capsules tid or as directed. 270 capsule 3   ??? hydrocortisone 2.5 % cream Apply to face 2x/day as needed 30 g 3   ??? latanoprost (XALATAN) 0.005 % ophthalmic solution Administer 1 drop to both eyes nightly.      ??? levoFLOXacin (LEVAQUIN) 500 MG tablet Take 1 tablet (500 mg total) by mouth daily. 30 tablet 0   ???  levothyroxine (SYNTHROID) 100 MCG tablet Take 1 tablet (100 mcg total) by mouth daily. 90 tablet 0   ??? lidocaine 2% viscous (XYLOCAINE) 2 % Soln Take 15 mls by mouth every four (4) hours as needed for mouth pain. 100 mL 0   ??? metoprolol succinate (TOPROL-XL) 25 MG 24 hr tablet Take 1 tablet (25 mg total) by mouth daily. 90 tablet 3   ??? pilocarpine (SALAGEN, PILOCARPINE,) 5 MG tablet Take 1 tablet (5 mg total) by mouth Three (3) times a day. 90 tablet 11   ??? posaconazole (NOXAFIL) 100 mg delayed released tablet Take 3 tablets (300 mg total) by mouth daily. Do not start until instructed by outpatient oncology team. 90 tablet 0   ??? potassium chloride (KLOR-CON) 10 MEQ CR tablet Take 2 tablets (20 mEq total) by mouth daily as needed (Take Daily IF you take Lasix same day). 30 tablet 0   ??? pravastatin (PRAVACHOL) 40 MG tablet Take 1 tablet (40 mg total) by mouth every evening. 30 tablet 11   ??? prochlorperazine (COMPAZINE) 10 MG tablet Take 1 tablet (10 mg total) by mouth every six (6) hours as needed for nausea. 30 tablet 0   ??? sertraline (ZOLOFT) 100 MG tablet Take 1 tablet (100 mg total) by mouth daily. 90 tablet 1   ??? timolol (TIMOPTIC) 0.5 % ophthalmic solution Administer 1 drop to the right eye Two (2) times a day.      ??? [START ON 02/07/2021] traMADoL (ULTRAM) 50 mg tablet Take 1 tablet (50 mg total) by mouth every six (6) hours as needed for pain. 120 tablet 0   ??? traZODone (DESYREL) 100 MG tablet Take 1 tablet (100 mg total) by mouth nightly. 90 tablet 3   ??? valACYclovir (VALTREX) 500 MG tablet Take 1 tablet (500 mg total) by mouth daily. 90 tablet 3   ??? venetoclax (VENCLEXTA) 100 mg tablet Take 1 tablet (100 mg total) by mouth daily. Take with a meal and water. Do not chew, crush, or break tablets. Do not start until instructed by outpatient oncology team. 30 tablet 0   ??? vit A/vit C/vit E/zinc/copper (PRESERVISION AREDS ORAL) Take by mouth Two (2) times a day.       No current facility-administered medications for this visit.     Facility-Administered Medications Ordered in Other Visits   Medication Dose Route Frequency Provider Last Rate Last Admin   ??? OKAY TO SEND MEDICATION/CHEMOTHERAPY TO OUTPATIENT UNIT   Other Once Guerry Bruin, MD           Vital Signs:  There were no vitals filed for this visit.  Vitals 10/3 reviewed. Slight hypotension without tachycardia, afebrile.    Relevant Physical Exam Findings:  Notable for 1-2+ bilateral peripheral edema in lower extremities through the shins  Lungs are bilaterally CTA  RRR      Relevant Laboratory, radiology and pathology results:  I personally viewed the most recent internal records, laboratory results, hematopathology report and cytogenetic reports  and discussed the available results with the patient and family  A summary of results follows:  Lab on 02/05/2021   Component Date Value Ref Range Status   ??? Haptoglobin 02/05/2021 196  40 - 280 mg/dL Final   ??? Reticulocyte Auto % 02/05/2021 0.54  0.50 - 2.17 % Final   ??? Absolute Auto Reticulocyte 02/05/2021 12.7 (A) 23.0 - 100.0 10*9/L Final   ??? LDH 02/05/2021 203  120 - 246 U/L Final   ??? Uric Acid 02/05/2021  5.1  3.7 - 9.2 mg/dL Final   ??? Magnesium 16/02/9603 1.6  1.6 - 2.6 mg/dL Final   ??? Sodium 54/01/8118 140  135 - 145 mmol/L Final   ??? Potassium 02/05/2021 3.7  3.5 - 5.1 mmol/L Final   ??? Chloride 02/05/2021 108 (A) 98 - 107 mmol/L Final   ??? CO2 02/05/2021 26.0  20.0 - 31.0 mmol/L Final   ??? Anion Gap 02/05/2021 6  5 - 14 mmol/L Final   ??? BUN 02/05/2021 25 (A) 9 - 23 mg/dL Final   ??? Creatinine 02/05/2021 1.01  0.60 - 1.10 mg/dL Final   ??? BUN/Creatinine Ratio 02/05/2021 25   Final   ??? eGFR CKD-EPI (2021) Male 02/05/2021 78  >=60 mL/min/1.30m2 Final    eGFR calculated with CKD-EPI 2021 equation in accordance with [employer] and [employer] of Nephrology Task Force recommendations.   ??? Glucose 02/05/2021 101  70 - 179 mg/dL Final   ??? Calcium 14/78/2956 8.2 (A) 8.7 - 10.4 mg/dL Final   ??? Albumin 21/30/8657 2.5 (A) 3.4 - 5.0 g/dL Final   ??? Total Protein 02/05/2021 5.1 (A) 5.7 - 8.2 g/dL Final   ??? Total Bilirubin 02/05/2021 0.9  0.3 - 1.2 mg/dL Final   ??? AST 84/69/6295 18  <=34 U/L Final   ??? ALT 02/05/2021 12  10 - 49 U/L Final   ??? Alkaline Phosphatase 02/05/2021 103  46 - 116 U/L Final   ??? Phosphorus 02/05/2021 2.2 (A) 2.4 - 5.1 mg/dL Final   ??? WBC 28/41/3244 0.2 (A) 3.6 - 11.2 10*9/L Final   ??? RBC 02/05/2021 2.36 (A) 4.26 - 5.60 10*12/L Final   ??? HGB 02/05/2021 7.9 (A) 12.9 - 16.5 g/dL Final   ??? HCT 05/08/7251 22.1 (A) 39.0 - 48.0 % Final   ??? MCV 02/05/2021 94.0  77.6 - 95.7 fL Final   ??? MCH 02/05/2021 33.6 (A) 25.9 - 32.4 pg Final   ??? MCHC 02/05/2021 35.8  32.0 - 36.0 g/dL Final   ??? RDW 66/44/0347 18.6 (A) 12.2 - 15.2 % Final   ??? MPV 02/05/2021 7.3  6.8 - 10.7 fL Final   ??? Platelet 02/05/2021 21 (A) 150 - 450 10*9/L Final   ??? Neutrophils % 02/05/2021 13.6  % Final   ??? Lymphocytes % 02/05/2021 60.4  % Final   ??? Monocytes % 02/05/2021 23.2  % Final   ??? Eosinophils % 02/05/2021 2.8  % Final   ??? Basophils % 02/05/2021 0.0  % Final   ??? Absolute Neutrophils 02/05/2021 0.0 (A) 1.8 - 7.8 10*9/L Final   ??? Absolute Lymphocytes 02/05/2021 0.1 (A) 1.1 - 3.6 10*9/L Final   ??? Absolute Monocytes 02/05/2021 0.1 (A) 0.3 - 0.8 10*9/L Final   ??? Absolute Eosinophils 02/05/2021 0.0  0.0 - 0.5 10*9/L Final   ??? Absolute Basophils 02/05/2021 0.0  0.0 - 0.1 10*9/L Final   ??? Anisocytosis 02/05/2021 Slight (A) Not Present Final   Hospital Outpatient Visit on 02/04/2021   Component Date Value Ref Range Status   ??? WBC 02/04/2021 0.3 (A) 3.6 - 11.2 10*9/L Final   ??? RBC 02/04/2021 1.77 (A) 4.26 - 5.60 10*12/L Final   ??? HGB 02/04/2021 6.1 (A) 12.9 - 16.5 g/dL Final   ??? HCT 42/59/5638 17.5 (A) 39.0 - 48.0 % Final   ??? MCV 02/04/2021 98.9 (A) 77.6 - 95.7 fL Final   ??? MCH 02/04/2021 34.5 (A) 25.9 - 32.4 pg Final   ??? MCHC 02/04/2021 34.9  32.0 -  36.0 g/dL Final   ??? RDW 16/02/9603 24.0 (A) 12.2 - 15.2 % Final   ??? MPV 02/04/2021 8.2  6.8 - 10.7 fL Final   ??? Platelet 02/04/2021 22 (A) 150 - 450 10*9/L Final   ??? Neutrophils % 02/04/2021 34.9  % Final   ??? Lymphocytes % 02/04/2021 58.9  % Final   ??? Monocytes % 02/04/2021 1.5  % Final   ??? Eosinophils % 02/04/2021 3.3  % Final   ??? Basophils % 02/04/2021 1.4  % Final   ??? Absolute Neutrophils 02/04/2021 0.1 (A) 1.8 - 7.8 10*9/L Final   ??? Absolute Lymphocytes 02/04/2021 0.2 (A) 1.1 - 3.6 10*9/L Final   ??? Absolute Monocytes 02/04/2021 0.0 (A) 0.3 - 0.8 10*9/L Final   ??? Absolute Eosinophils 02/04/2021 0.0  0.0 - 0.5 10*9/L Final   ??? Absolute Basophils 02/04/2021 0.0  0.0 - 0.1 10*9/L Final   ??? Anisocytosis 02/04/2021 Marked (A) Not Present Final   ??? Smear Review Comments 02/04/2021 See Comment (A) Undefined Final    Slide reviewed  Agranular neutrophils present.  Instrument ID: [id number]   ??? Dohle Bodies 02/04/2021 Present (A) Not Present Final   ??? Toxic Vacuolation 02/04/2021 Present (A) Not Present Final   ??? Crossmatch 02/05/2021 Compatible   Final   ??? Unit Blood Type 02/05/2021 A Pos   Final   ??? ISBT Number 02/05/2021 6200   Final   ??? Unit # 02/05/2021 [id number]   Final   ??? Status 02/05/2021 Transfused   Final   ??? Product ID 02/05/2021 Red Blood Cells   Final   ??? PRODUCT CODE 02/05/2021 [id number]   Final   ??? Crossmatch 02/05/2021 Compatible   Final   ??? Unit Blood Type 02/05/2021 A Pos   Final ??? ISBT Number 02/05/2021 6200   Final   ??? Unit # 02/05/2021 [id number]   Final   ??? Status 02/05/2021 Transfused   Final   ??? Product ID 02/05/2021 Red Blood Cells   Final   ??? PRODUCT CODE 02/05/2021 [id number]   Final   Hospital Outpatient Visit on 02/03/2021   Component Date Value Ref Range Status   ??? Uric Acid 02/03/2021 5.1  3.7 - 9.2 mg/dL Final   ??? LDH 02/03/2021 193  120 - 246 U/L Final   ??? Phosphorus 02/03/2021 2.3 (A) 2.4 - 5.1 mg/dL Final   ??? Magnesium 02/72/5366 1.5 (A) 1.6 - 2.6 mg/dL Final   ??? Sodium 44/07/4740 136  135 - 145 mmol/L Final   ??? Potassium 02/03/2021 3.6  3.4 - 4.8 mmol/L Final   ??? Chloride 02/03/2021 104  98 - 107 mmol/L Final   ??? CO2 02/03/2021 25.0  20.0 - 31.0 mmol/L Final   ??? Anion Gap 02/03/2021 7  5 - 14 mmol/L Final   ??? BUN 02/03/2021 21  9 - 23 mg/dL Final   ??? Creatinine 02/03/2021 1.08  0.60 - 1.10 mg/dL Final   ??? BUN/Creatinine Ratio 02/03/2021 19   Final   ??? eGFR CKD-EPI (2021) Male 02/03/2021 72  >=60 mL/min/1.42m2 Final    eGFR calculated with CKD-EPI 2021 equation in accordance with [employer] and [employer] of Nephrology Task Force recommendations.   ??? Glucose 02/03/2021 91  70 - 179 mg/dL Final   ??? Calcium 59/56/3875 8.3 (A) 8.7 - 10.4 mg/dL Final   ??? Albumin 64/33/2951 2.7 (A) 3.4 - 5.0 g/dL Final   ??? Total Protein 02/03/2021 5.6 (A) 5.7 - 8.2 g/dL Final   ???  Total Bilirubin 02/03/2021 0.9  0.3 - 1.2 mg/dL Final   ??? AST 16/02/9603 17  <=34 U/L Final   ??? ALT 02/03/2021 11  10 - 49 U/L Final   ??? Alkaline Phosphatase 02/03/2021 98  46 - 116 U/L Final   ??? Antibody Screen 02/03/2021 NEG   Final   ??? Blood Type 02/03/2021 AB POS   Final   ??? WBC 02/03/2021 0.3 (A) 3.6 - 11.2 10*9/L Final    WBC count insufficient for precise differential.    ??? RBC 02/03/2021 1.76 (A) 4.26 - 5.60 10*12/L Final   ??? HGB 02/03/2021 6.0 (A) 12.9 - 16.5 g/dL Final   ??? HCT 54/01/8118 17.2 (A) 39.0 - 48.0 % Final   ??? MCV 02/03/2021 98.1 (A) 77.6 - 95.7 fL Final   ??? MCH 02/03/2021 34.4 (A) 25.9 - 32.4 pg Final   ??? MCHC 02/03/2021 35.1  32.0 - 36.0 g/dL Final   ??? RDW 14/78/2956 24.3 (A) 12.2 - 15.2 % Final   ??? MPV 02/03/2021 7.5  6.8 - 10.7 fL Final   ??? Platelet 02/03/2021 20 (A) 150 - 450 10*9/L Final   ??? Neutrophils % 02/03/2021 33.4  % Final   ??? Lymphocytes % 02/03/2021 58.1  % Final   ??? Monocytes % 02/03/2021 7.3  % Final   ??? Eosinophils % 02/03/2021 1.2  % Final   ??? Basophils % 02/03/2021 0.0  % Final   ??? Absolute Neutrophils 02/03/2021 0.1 (A) 1.8 - 7.8 10*9/L Final   ??? Absolute Lymphocytes 02/03/2021 0.2 (A) 1.1 - 3.6 10*9/L Final   ??? Absolute Monocytes 02/03/2021 0.0 (A) 0.3 - 0.8 10*9/L Final   ??? Absolute Eosinophils 02/03/2021 0.0  0.0 - 0.5 10*9/L Final   ??? Absolute Basophils 02/03/2021 0.0  0.0 - 0.1 10*9/L Final   ??? Anisocytosis 02/03/2021 Marked (A) Not Present Final   ??? Smear Review Comments 02/03/2021 See Comment (A) Undefined Final    Slide reviewed  Blasts Present-rare  Agranular neutrophils present.  Instrument ID: [id number]     ??? Dohle Bodies 02/03/2021 Present (A) Not Present Final   ??? Toxic Vacuolation 02/03/2021 Present (A) Not Present Final   ??? Crossmatch 02/04/2021 Compatible   Final   ??? Unit Blood Type 02/04/2021 O Pos   Final   ??? ISBT Number 02/04/2021 5100   Final   ??? Unit # 02/04/2021 [id number]   Final   ??? Status 02/04/2021 Released to Avail   Final   ??? Spec Expiration 02/04/2021 [id number]   Final   ??? Product ID 02/04/2021 Red Blood Cells   Final   ??? PRODUCT CODE 02/04/2021 [id number]   Final   ??? Crossmatch 02/04/2021 Compatible   Final   ??? Unit Blood Type 02/04/2021 A Pos   Final   ??? ISBT Number 02/04/2021 6200   Final   ??? Unit # 02/04/2021 [id number]   Final   ??? Status 02/04/2021 Released to Avail   Final   ??? Spec Expiration 02/04/2021 [id number]   Final   ??? Product ID 02/04/2021 Red Blood Cells   Final   ??? PRODUCT CODE 02/04/2021 [id number]   Final   ??? Crossmatch 02/04/2021 Compatible   Final   ??? Unit Blood Type 02/04/2021 A Pos   Final   ??? ISBT Number 02/04/2021 6200   Final   ??? Unit # 02/04/2021 [id number]   Final   ??? Status 02/04/2021 Released to Avail   Final   ??? Spec Expiration 02/04/2021 [id number]  Final   ??? Product ID 02/04/2021 Red Blood Cells   Final   ??? PRODUCT CODE 02/04/2021 [id number]   Final   Hospital Outpatient Visit on 02/01/2021   Component Date Value Ref Range Status   ??? Crossmatch 02/02/2021 Compatible   Final   ??? Unit Blood Type 02/02/2021 A Pos   Final   ??? ISBT Number 02/02/2021 6200   Final   ??? Unit # 02/02/2021 [id number]   Final   ??? Status 02/02/2021 Released to Avail   Final   ??? Spec Expiration 02/02/2021 [id number]   Final   ??? Product ID 02/02/2021 Red Blood Cells   Final   ??? PRODUCT CODE 02/02/2021 [id number]   Final   ??? Crossmatch 02/02/2021 Compatible   Final   ??? Unit Blood Type 02/02/2021 A Pos   Final   ??? ISBT Number 02/02/2021 6200   Final   ??? Unit # 02/02/2021 [id number]   Final   ??? Status 02/02/2021 Released to Avail   Final   ??? Spec Expiration 02/02/2021 [id number]   Final   ??? Product ID 02/02/2021 Red Blood Cells   Final   ??? PRODUCT CODE 02/02/2021 [id number]   Final   Lab on 02/01/2021   Component Date Value Ref Range Status   ??? Sodium 02/01/2021 139  135 - 145 mmol/L Final   ??? Potassium 02/01/2021 3.4  3.4 - 4.8 mmol/L Final   ??? Chloride 02/01/2021 108 (A) 98 - 107 mmol/L Final   ??? CO2 02/01/2021 23.0  20.0 - 31.0 mmol/L Final   ??? Anion Gap 02/01/2021 8  5 - 14 mmol/L Final   ??? BUN 02/01/2021 17  9 - 23 mg/dL Final   ??? Creatinine 02/01/2021 0.94  0.60 - 1.10 mg/dL Final   ??? BUN/Creatinine Ratio 02/01/2021 18   Final   ??? eGFR CKD-EPI (2021) Male 02/01/2021 85  >=60 mL/min/1.95m2 Final    eGFR calculated with CKD-EPI 2021 equation in accordance with [employer] and [employer] Task Force recommendations.   ??? Glucose 02/01/2021 99  70 - 179 mg/dL Final   ??? Calcium 29/51/8841 8.7  8.7 - 10.4 mg/dL Final   ??? Albumin 66/10/3014 3.4  3.4 - 5.0 g/dL Final   ??? Total Protein 02/01/2021 6.4  5.7 - 8.2 g/dL Final   ??? Total Bilirubin 02/01/2021 0.7  0.3 - 1.2 mg/dL Final   ??? AST 05/14/3233 20  <=34 U/L Final   ??? ALT 02/01/2021 15  10 - 49 U/L Final   ??? Alkaline Phosphatase 02/01/2021 118 (A) 46 - 116 U/L Final   ??? Magnesium 02/01/2021 1.4 (A) 1.6 - 2.6 mg/dL Final   ??? Phosphorus 02/01/2021 2.9  2.4 - 5.1 mg/dL Final   ??? LDH 57/32/2025 233  120 - 246 U/L Final   ??? Uric Acid 02/01/2021 4.7  3.7 - 9.2 mg/dL Final   ??? WBC 42/70/6237 0.5 (A) 3.6 - 11.2 10*9/L Final   ??? RBC 02/01/2021 2.34 (A) 4.26 - 5.60 10*12/L Final   ??? HGB 02/01/2021 8.2 (A) 12.9 - 16.5 g/dL Final   ??? HCT 62/83/1517 23.0 (A) 39.0 - 48.0 % Final   ??? MCV 02/01/2021 98.4 (A) 77.6 - 95.7 fL Final   ??? MCH 02/01/2021 35.0 (A) 25.9 - 32.4 pg Final   ??? MCHC 02/01/2021 35.5  32.0 - 36.0 g/dL Final   ??? RDW 61/60/7371 25.1 (A) 12.2 - 15.2 % Final   ??? MPV 02/01/2021 7.4  6.8 -  10.7 fL Final   ??? Platelet 02/01/2021 32 (A) 150 - 450 10*9/L Final   ??? Neutrophils % 02/01/2021 17.7  % Final   ??? Lymphocytes % 02/01/2021 67.4  % Final   ??? Monocytes % 02/01/2021 9.9  % Final   ??? Eosinophils % 02/01/2021 3.5  % Final   ??? Basophils % 02/01/2021 1.5  % Final   ??? Absolute Neutrophils 02/01/2021 0.1 (A) 1.8 - 7.8 10*9/L Final   ??? Absolute Lymphocytes 02/01/2021 0.3 (A) 1.1 - 3.6 10*9/L Final   ??? Absolute Monocytes 02/01/2021 0.0 (A) 0.3 - 0.8 10*9/L Final   ??? Absolute Eosinophils 02/01/2021 0.0  0.0 - 0.5 10*9/L Final   ??? Absolute Basophils 02/01/2021 0.0  0.0 - 0.1 10*9/L Final   ??? Anisocytosis 02/01/2021 Marked (A) Not Present Final   ??? Smear Review Comments 02/01/2021 See Comment (A) Undefined Final    Slide reviewed. Blasts present.    ??? Giant Platelets 02/01/2021 Present (A) Not Present Final   Hospital Outpatient Visit on 01/31/2021   Component Date Value Ref Range Status   ??? Antibody Screen 01/31/2021 NEG   Final   ??? Blood Type 01/31/2021 AB POS   Final   Lab on 01/31/2021   Component Date Value Ref Range Status   ??? Sodium 01/31/2021 141  135 - 145 mmol/L Final   ??? Potassium 01/31/2021 3.3 (A) 3.4 - 4.8 mmol/L Final   ??? Chloride 01/31/2021 110 (A) 98 - 107 mmol/L Final   ??? CO2 01/31/2021 23.0  20.0 - 31.0 mmol/L Final   ??? Anion Gap 01/31/2021 8  5 - 14 mmol/L Final   ??? BUN 01/31/2021 17  9 - 23 mg/dL Final   ??? Creatinine 01/31/2021 0.89  0.60 - 1.10 mg/dL Final   ??? BUN/Creatinine Ratio 01/31/2021 19   Final   ??? eGFR CKD-EPI (2021) Male 01/31/2021 89  >=60 mL/min/1.57m2 Final    eGFR calculated with CKD-EPI 2021 equation in accordance with [employer] and [employer] Task [employer] recommendations.   ??? Glucose 01/31/2021 85  70 - 179 mg/dL Final   ??? Calcium 29/56/2130 8.1 (A) 8.7 - 10.4 mg/dL Final   ??? Albumin 86/57/8469 3.0 (A) 3.4 - 5.0 g/dL Final   ??? Total Protein 01/31/2021 5.9  5.7 - 8.2 g/dL Final   ??? Total Bilirubin 01/31/2021 0.5  0.3 - 1.2 mg/dL Final   ??? AST 62/95/2841 22  <=34 U/L Final   ??? ALT 01/31/2021 15  10 - 49 U/L Final   ??? Alkaline Phosphatase 01/31/2021 105  46 - 116 U/L Final   ??? Magnesium 01/31/2021 1.3 (A) 1.6 - 2.6 mg/dL Final   ??? Phosphorus 01/31/2021 2.5  2.4 - 5.1 mg/dL Final   ??? LDH 32/44/0102 235  120 - 246 U/L Final   ??? Uric Acid 01/31/2021 3.9  3.7 - 9.2 mg/dL Final   ??? WBC 72/53/6644 0.7 (A) 3.6 - 11.2 10*9/L Final   ??? RBC 01/31/2021 2.24 (A) 4.26 - 5.60 10*12/L Final   ??? HGB 01/31/2021 7.8 (A) 12.9 - 16.5 g/dL Final   ??? HCT 03/47/4259 22.0 (A) 39.0 - 48.0 % Final   ??? MCV 01/31/2021 98.0 (A) 77.6 - 95.7 fL Final   ??? MCH 01/31/2021 34.9 (A) 25.9 - 32.4 pg Final   ??? MCHC 01/31/2021 35.6  32.0 - 36.0 g/dL Final   ??? RDW 56/38/7564 24.4 (A) 12.2 - 15.2 % Final   ??? MPV 01/31/2021 8.2  6.8 - 10.7 fL Final   ??? Platelet 01/31/2021 35 (A) 150 - 450 10*9/L Final   ??? Neutrophils % 01/31/2021 14.0  % Final   ??? Lymphocytes % 01/31/2021 67.7  % Final   ??? Monocytes % 01/31/2021 17.1  % Final   ??? Eosinophils % 01/31/2021 1.2  % Final   ??? Basophils % 01/31/2021 0.0  % Final   ??? Absolute Neutrophils 01/31/2021 0.1 (A) 1.8 - 7.8 10*9/L Final   ??? Absolute Lymphocytes 01/31/2021 0.5 (A) 1.1 - 3.6 10*9/L Final   ??? Absolute Monocytes 01/31/2021 0.1 (A) 0.3 - 0.8 10*9/L Final   ??? Absolute Eosinophils 01/31/2021 0.0  0.0 - 0.5 10*9/L Final   ??? Absolute Basophils 01/31/2021 0.0  0.0 - 0.1 10*9/L Final   ??? Anisocytosis 01/31/2021 Marked (A) Not Present Final   ??? Smear Review Comments 01/31/2021 See Comment (A) Undefined Final    Blasts Present.  Agranular neutrophils present.  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  -  See linked reports for associated Ancillary Studies.      This electronic signature is attestation that the pathologist personally reviewed the submitted material(s) and the final diagnosis reflects that evaluation.

## 2021-02-06 NOTE — Unmapped (Signed)
Hospital Outpatient Visit on 02/06/2021   Component Date Value Ref Range Status    Crossmatch 02/06/2021 Compatible   Final    Unit Blood Type 02/06/2021 A Pos   Final    ISBT Number 02/06/2021 6200   Final    Unit # 02/06/2021 [id number]   Final    Status 02/06/2021 Issued   Final    Spec Expiration 02/06/2021 [id number]   Final    Product ID 02/06/2021 Red Blood Cells   Final    PRODUCT CODE 02/06/2021 [id number]   Final    Crossmatch 02/06/2021 Compatible   Final    Unit Blood Type 02/06/2021 A Pos   Final    ISBT Number 02/06/2021 6200   Final    Unit # 02/06/2021 [id number]   Final    Status 02/06/2021 Ready   Final    Spec Expiration 02/06/2021 [id number]   Final    Product ID 02/06/2021 Red Blood Cells   Final    PRODUCT CODE 02/06/2021 [id number]   Final

## 2021-02-06 NOTE — Unmapped (Signed)
Patient tolerated Aza subQ injection, transfusion pRBC x 2,  without complication; injection site unremarkable. PIV left in place for return visit, no sign of infiltration. No concerns upon departure.

## 2021-02-07 ENCOUNTER — Ambulatory Visit: Admit: 2021-02-07 | Discharge: 2021-02-08 | Payer: MEDICARE

## 2021-02-07 ENCOUNTER — Encounter: Admit: 2021-02-07 | Discharge: 2021-02-08 | Payer: MEDICARE | Attending: Adult Health | Primary: Adult Health

## 2021-02-07 ENCOUNTER — Other Ambulatory Visit: Admit: 2021-02-07 | Discharge: 2021-02-08 | Payer: MEDICARE

## 2021-02-07 DIAGNOSIS — C9200 Acute myeloblastic leukemia, not having achieved remission: Principal | ICD-10-CM

## 2021-02-07 LAB — PHOSPHORUS: PHOSPHORUS: 2.1 mg/dL — ABNORMAL LOW (ref 2.4–5.1)

## 2021-02-07 LAB — COMPREHENSIVE METABOLIC PANEL
ALBUMIN: 2.8 g/dL — ABNORMAL LOW (ref 3.4–5.0)
ALKALINE PHOSPHATASE: 118 U/L — ABNORMAL HIGH (ref 46–116)
ALT (SGPT): 11 U/L (ref 10–49)
ANION GAP: 9 mmol/L (ref 5–14)
AST (SGOT): 25 U/L (ref <=34)
BILIRUBIN TOTAL: 1.6 mg/dL — ABNORMAL HIGH (ref 0.3–1.2)
BLOOD UREA NITROGEN: 23 mg/dL (ref 9–23)
BUN / CREAT RATIO: 22
CALCIUM: 9 mg/dL (ref 8.7–10.4)
CHLORIDE: 106 mmol/L (ref 98–107)
CO2: 24 mmol/L (ref 20.0–31.0)
CREATININE: 1.06 mg/dL
EGFR CKD-EPI (2021) MALE: 73 mL/min/{1.73_m2} (ref >=60)
GLUCOSE RANDOM: 114 mg/dL (ref 70–179)
POTASSIUM: 3.6 mmol/L (ref 3.4–4.8)
PROTEIN TOTAL: 5.8 g/dL (ref 5.7–8.2)
SODIUM: 139 mmol/L (ref 135–145)

## 2021-02-07 LAB — CBC W/ AUTO DIFF
BASOPHILS ABSOLUTE COUNT: 0 10*9/L (ref 0.0–0.1)
BASOPHILS RELATIVE PERCENT: 0 %
EOSINOPHILS ABSOLUTE COUNT: 0 10*9/L (ref 0.0–0.5)
EOSINOPHILS RELATIVE PERCENT: 1.2 %
HEMATOCRIT: 26.6 % — ABNORMAL LOW (ref 39.0–48.0)
HEMOGLOBIN: 9.5 g/dL — ABNORMAL LOW (ref 12.9–16.5)
LYMPHOCYTES ABSOLUTE COUNT: 0.1 10*9/L — ABNORMAL LOW (ref 1.1–3.6)
LYMPHOCYTES RELATIVE PERCENT: 58.8 %
MEAN CORPUSCULAR HEMOGLOBIN CONC: 35.6 g/dL (ref 32.0–36.0)
MEAN CORPUSCULAR HEMOGLOBIN: 31.9 pg (ref 25.9–32.4)
MEAN CORPUSCULAR VOLUME: 89.6 fL (ref 77.6–95.7)
MEAN PLATELET VOLUME: 7.5 fL (ref 6.8–10.7)
MONOCYTES ABSOLUTE COUNT: 0 10*9/L — ABNORMAL LOW (ref 0.3–0.8)
MONOCYTES RELATIVE PERCENT: 9.7 %
NEUTROPHILS ABSOLUTE COUNT: 0.1 10*9/L — CL (ref 1.8–7.8)
NEUTROPHILS RELATIVE PERCENT: 30.3 %
PLATELET COUNT: 13 10*9/L — ABNORMAL LOW (ref 150–450)
RED BLOOD CELL COUNT: 2.97 10*12/L — ABNORMAL LOW (ref 4.26–5.60)
RED CELL DISTRIBUTION WIDTH: 16.9 % — ABNORMAL HIGH (ref 12.2–15.2)
WBC ADJUSTED: 0.2 10*9/L — CL (ref 3.6–11.2)

## 2021-02-07 LAB — LACTATE DEHYDROGENASE: LACTATE DEHYDROGENASE: 236 U/L (ref 120–246)

## 2021-02-07 LAB — URIC ACID: URIC ACID: 4.7 mg/dL

## 2021-02-07 LAB — MAGNESIUM: MAGNESIUM: 1.5 mg/dL — ABNORMAL LOW (ref 1.6–2.6)

## 2021-02-07 MED ORDER — TRAMADOL 50 MG TABLET
ORAL_TABLET | Freq: Four times a day (QID) | ORAL | 0 refills | 30 days | Status: CP | PRN
Start: 2021-02-07 — End: ?

## 2021-02-07 MED ADMIN — azaCITIDine (VIDAZA) syringe: 75 mg/m2 | SUBCUTANEOUS | @ 16:00:00 | Stop: 2021-02-07

## 2021-02-07 MED ADMIN — traMADoL (ULTRAM) tablet 50 mg: 50 mg | ORAL | @ 16:00:00 | Stop: 2021-02-07

## 2021-02-07 MED ADMIN — ondansetron (ZOFRAN) tablet 8 mg: 8 mg | ORAL | @ 16:00:00 | Stop: 2021-02-07

## 2021-02-07 MED ADMIN — magnesium oxide (MAG-OX) tablet 400 mg: 400 mg | ORAL | @ 16:00:00 | Stop: 2021-02-07

## 2021-02-07 NOTE — Unmapped (Signed)
Infusion Center Progress Note    Patient Name: David Riley  Patient Age: 75 y.o.  Encounter Date: 02/07/2021    Reason for visit  Chief Complaint: Leg swelling and back pain  Current Therapy: C1D7 Azacitadine + Venetoclax         Assessment/Plan:  Diarrhea (CTCAE Grade 1): Onset a couple of days after starting treatment. Having 1-2 episodes a day. Denies mucus, blood or black stools and fevers/chills. Treating with Imodium PRN. Took last dose of Imodium last night and has remained without further episodes today.     --Continue Imodium PRN for likely chemotherapy induced diarrhea  --Increase oral intake of fluids with goal of 64 oz daily (water, sports drinks) to prevent dehydration.   ??  ??  BLE edema: Continues to be an ongoing complaint. Evaluated by his primary Oncology team on 02/05/21 and started on Furosemide 20mg  daily for fluid overload. Has history of CHF with last known EF >55% on ECHO from 07/16/2017. He denies chest pain, SOB or calf pain. Has only had one dose of Furosemide thus far and has not been consistent with daily weights.     --Reinforced to patient the need to continue Furosemide daily as previously instructed  --Reinforced to patient to perform and record daily weights  --Advised patient to purchase compression stockings and apply them in the morning. Wear them during the day and remove them prior to bedtime.   --Advised patient to keep legs elevated above heart level to help decrease leg swelling.     ??  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 He denies falls or tripping over his feet. He is not sure if he is taking Gabapentin as he has so many daily medications.   --Advised patient to check his home pill containers for Gabapentin. If not taking I suggested he start taking it as prescribed.   --Falls precautions discussed.     ??  Back pain: Chronic back pain since right upper back excision of soft tissue mass on 10/08/2019. Was previously taking Tylenol, but was instructed to stop and take Tramadol instead. He has been taking two doses daily (one in the morning and one in the evening.) Doesn't feel like it is helping much. Denies loss of bowel or bladder function or weakness in his legs.   --Advised patient to increase the frequency of Tramadol to every 6 hours scheduled for 24 hours and if no improvement in pain with maximal dosage frequency, notify his primary oncology team.       ??  Disposition: David Riley and his spouse verbalized understanding of the information discussed during the visit today. He will notify his primary Oncologist if he has any further question/concerns post discharge.       Subjective/HPI:  David Riley is a 75 y.o. patient with HFpEF, nephorliathiasis, DVT, atrial fibrillation, chronic back pain and new diagnoses of AML. He initiated C1D1 Azacitadine + Venetoclax treatment on 01/31/2021 and is in the infusion clinic for day 7. He has c/o diarrhea, bilateral leg edema and back pain.     Diarrhea started a couple of days after starting chemotherapy. He describes 1-2 episodes daily; typically at night. He denies mucus, blood or black stools and fevers/chills. Treating with Imodium PRN. Took last dose of Imodium last night and has remained without further episodes today.     He has BLE edema, feeling as if his legs weight 50 pounds. He was evaluated  in the Oncology clinic on 02/05/2021 and started on Furosemide 20mg  daily as needed, stopping once weight decreased by 5 pounds. He just took his first dose yesterday. He has not taken a dose today. He has numbness and tingling in his legs and feet from established peripheral neuropathy. He denies calf pain, falls or tripping over his feet. He is not sure if he is taking Gabapentin or not.     He has c/o low back pain. Pain has been present since right upper back excision of soft tissue mass on 10/08/2019. Was previously taking Tylenol, but was instructed by his Oncology team to stop and take Tramadol instead. He has been taking two doses daily (one in the morning and one in the evening.) Doesn't feel like it is helping much. Denies loss of bowel or bladder function or weakness in his legs.         Oncology History:  Oncology History Overview Note   Referring/Local Oncologist: None    Diagnosis: Acute Myeloid Leukemia    Genetics:   Karyotype/FISH: 72 XY     Molecular Genetics:         Variants of Known/Likely Clinical Significance:   Gene Coding Predicted Protein Variant allele fraction   ASXL1 c.1782C>A p.(Cys594Ter) 22.6 %   CSF3R c.2326C>T p.(Gln776Ter) 4.9 %   IDH1 c.394C>T p.(Arg132Cys) 12.5 %   U2AF1 c.470A>C p.(Gln157Pro) 24.6 %       Pertinent Phenotypic data: blasts express CD7, CD13, CD117, CD34, and HLADR.  They are negative for CD19, CD20, CD22, surface CD3, and other analyzed surface T-cell markers.  They are also negative for cytoplasmic MPO, CD3, and TdT.     Disease-specific prognostic estimate: ELN adverse         Acute myeloid leukemia (CMS-HCC)   01/19/2021 Initial Diagnosis    Acute myeloid leukemia (CMS-HCC)     01/31/2021 -  Chemotherapy    OP AML AZACITIDINE + VENETOCLAX  azacitidine 75 mg/m2 SQ on days 1-7, venetoclax ramp up Week 1 (dose dependent), then azacitidine 75 mg/m2 SQ on Days 1-7 every 28 days         Allergies:  No Known Allergies    Medications:  Prior to Admission medications    Medication Dose, Route, Frequency   allopurinol (ZYLOPRIM) 300 MG tablet 300 mg, Oral, Daily (standard)   finasteride (PROSCAR) 5 mg tablet 5 mg, Oral, Daily (standard)   furosemide (LASIX) 20 MG tablet 20 mg, Oral, Daily PRN   gabapentin (NEURONTIN) 100 MG capsule Take up to 3 capsules tid or as directed.   hydrocortisone 2.5 % cream Apply to face 2x/day as needed   latanoprost (XALATAN) 0.005 % ophthalmic solution 1 drop, Both Eyes, Nightly   levoFLOXacin (LEVAQUIN) 500 MG tablet 500 mg, Oral, Every 24 hours   levothyroxine (SYNTHROID) 100 MCG tablet 100 mcg, Oral, Daily (standard)   lidocaine 2% viscous (XYLOCAINE) 2 % Soln Take 15 mls by mouth every four (4) hours as needed for mouth pain.   metoprolol succinate (TOPROL-XL) 25 MG 24 hr tablet 25 mg, Oral, Daily (standard)   pilocarpine (SALAGEN, PILOCARPINE,) 5 MG tablet 5 mg, Oral, 3 times a day (standard)   posaconazole (NOXAFIL) 100 mg delayed released tablet 300 mg, Oral, Daily (standard), Do not start until instructed by outpatient oncology team.   potassium chloride (KLOR-CON) 10 MEQ CR tablet 20 mEq, Oral, Daily PRN   pravastatin (PRAVACHOL) 40 MG tablet 40 mg, Oral, Every evening   prochlorperazine (COMPAZINE) 10 MG tablet 10  mg, Oral, Every 6 hours PRN   sertraline (ZOLOFT) 100 MG tablet 100 mg, Oral, Daily (standard)   timolol (TIMOPTIC) 0.5 % ophthalmic solution 1 drop, Right Eye, 2 times a day (standard)   traMADoL (ULTRAM) 50 mg tablet 50 mg, Oral, Every 6 hours PRN   traZODone (DESYREL) 100 MG tablet 100 mg, Oral, Nightly   valACYclovir (VALTREX) 500 MG tablet 500 mg, Oral, Daily (standard)   venetoclax (VENCLEXTA) 100 mg tablet 100 mg, Oral, Daily (standard), Take with a meal and water. Do not chew, crush, or break tablets. Do not start until instructed by outpatient oncology team.   vit A/vit C/vit E/zinc/copper (PRESERVISION AREDS ORAL) Oral, 2 times a day (standard)       Review of Systems:  Review of Systems   Constitutional: Positive for fatigue. Negative for chills, diaphoresis and fever.   HENT:   Negative for mouth sores, nosebleeds, sore throat and trouble swallowing.         History of Oral lichen planus with associated Xerostomia.   Eyes: Negative for eye problems.   Respiratory: Negative for chest tightness, cough, shortness of breath and wheezing.    Cardiovascular: Positive for leg swelling. Negative for chest pain and palpitations.   Gastrointestinal: Positive for diarrhea. Negative for abdominal distention, abdominal pain, blood in stool, constipation, nausea and vomiting. Genitourinary: Positive for hematuria.         Has stent in place and will require follow up with Urology for discussion of stent removal and/or exchange.    Musculoskeletal: Positive for back pain. Negative for gait problem.   Skin: Negative for rash.   Neurological: Positive for numbness. Negative for gait problem, headaches and light-headedness.        BLE numbness and tingling; has history of peripheral neuropathy.    Hematological: Does not bruise/bleed easily.       Physical Exam:  Vital Signs:  There were no vitals taken for this visit.     Physical Exam  Constitutional:       General: He is not in acute distress.     Appearance: He is not ill-appearing, toxic-appearing or diaphoretic.   HENT:      Head: Normocephalic and atraumatic.      Nose: Nose normal.      Mouth/Throat:      Mouth: Mucous membranes are dry.      Pharynx: No oropharyngeal exudate or posterior oropharyngeal erythema.   Eyes:      Extraocular Movements: Extraocular movements intact.      Conjunctiva/sclera: Conjunctivae normal.      Pupils: Pupils are equal, round, and reactive to light.   Cardiovascular:      Rate and Rhythm: Normal rate and regular rhythm.      Pulses: Normal pulses.      Heart sounds: Normal heart sounds. No murmur heard.    No friction rub. No gallop.   Pulmonary:      Effort: Pulmonary effort is normal. No respiratory distress.      Breath sounds: Normal breath sounds. No wheezing or rhonchi.   Chest:      Chest wall: No tenderness.   Abdominal:      General: Abdomen is flat.      Palpations: Abdomen is soft. There is no mass.      Tenderness: There is abdominal tenderness. There is no rebound.      Comments: TTP at Azacitadine subcutaneous injection sites.    Musculoskeletal:      Right  lower leg: Edema present.      Left lower leg: Edema present.      Comments: 3+ pitting edema BLE   Skin:     General: Skin is warm.      Findings: No lesion or rash.   Neurological:      General: No focal deficit present. Mental Status: He is alert and oriented to person, place, and time. Mental status is at baseline.           Results:  Lab on 02/07/2021   Component Date Value Ref Range Status   ??? Sodium 02/07/2021 139  135 - 145 mmol/L Final   ??? Potassium 02/07/2021 3.6  3.4 - 4.8 mmol/L Final   ??? Chloride 02/07/2021 106  98 - 107 mmol/L Final   ??? CO2 02/07/2021 24.0  20.0 - 31.0 mmol/L Final   ??? Anion Gap 02/07/2021 9  5 - 14 mmol/L Final   ??? BUN 02/07/2021 23  9 - 23 mg/dL Final   ??? Creatinine 02/07/2021 1.06  0.60 - 1.10 mg/dL Final   ??? BUN/Creatinine Ratio 02/07/2021 22   Final   ??? eGFR CKD-EPI (2021) Male 02/07/2021 73  >=60 mL/min/1.12m2 Final    eGFR calculated with CKD-EPI 2021 equation in accordance with [employer] and [employer] of Nephrology Task Force recommendations.   ??? Glucose 02/07/2021 114  70 - 179 mg/dL Final   ??? Calcium 45/40/9811 9.0  8.7 - 10.4 mg/dL Final   ??? Albumin 91/47/8295 2.8 (A) 3.4 - 5.0 g/dL Final   ??? Total Protein 02/07/2021 5.8  5.7 - 8.2 g/dL Final   ??? Total Bilirubin 02/07/2021 1.6 (A) 0.3 - 1.2 mg/dL Final   ??? AST 62/13/0865 25  <=34 U/L Final   ??? ALT 02/07/2021 11  10 - 49 U/L Final   ??? Alkaline Phosphatase 02/07/2021 118 (A) 46 - 116 U/L Final   ??? Magnesium 02/07/2021 1.5 (A) 1.6 - 2.6 mg/dL Final   ??? Phosphorus 02/07/2021 2.1 (A) 2.4 - 5.1 mg/dL Final   ??? LDH 02/07/2021 236  120 - 246 U/L Final   ??? Uric Acid 02/07/2021 4.7  3.7 - 9.2 mg/dL Final   ??? WBC 78/46/9629 0.2 (A) 3.6 - 11.2 10*9/L Final   ??? RBC 02/07/2021 2.97 (A) 4.26 - 5.60 10*12/L Final   ??? HGB 02/07/2021 9.5 (A) 12.9 - 16.5 g/dL Final   ??? HCT 52/84/1324 26.6 (A) 39.0 - 48.0 % Final   ??? MCV 02/07/2021 89.6  77.6 - 95.7 fL Final   ??? MCH 02/07/2021 31.9  25.9 - 32.4 pg Final   ??? MCHC 02/07/2021 35.6  32.0 - 36.0 g/dL Final   ??? RDW 40/02/2724 16.9 (A) 12.2 - 15.2 % Final   ??? MPV 02/07/2021 7.5  6.8 - 10.7 fL Final   ??? Platelet 02/07/2021 13 (A) 150 - 450 10*9/L Final   ??? Neutrophils % 02/07/2021 30.3  % Final ??? Lymphocytes % 02/07/2021 58.8  % Final   ??? Monocytes % 02/07/2021 9.7  % Final   ??? Eosinophils % 02/07/2021 1.2  % Final   ??? Basophils % 02/07/2021 0.0  % Final   ??? Absolute Neutrophils 02/07/2021 0.1 (A) 1.8 - 7.8 10*9/L Final   ??? Absolute Lymphocytes 02/07/2021 0.1 (A) 1.1 - 3.6 10*9/L Final   ??? Absolute Monocytes 02/07/2021 0.0 (A) 0.3 - 0.8 10*9/L Final   ??? Absolute Eosinophils 02/07/2021 0.0  0.0 - 0.5 10*9/L Final   ??? Absolute Basophils  02/07/2021 0.0  0.0 - 0.1 10*9/L Final   ??? Anisocytosis 02/07/2021 Slight (A) Not Present Final           I personally spent 35 minutes face-to-face and non-face-to-face in the care of this patient, which includes all pre, intra, and post visit time on the date of service.         Montel Culver, ANP  ADVANCED PRACTICE PROVIDER FOR INFUSION PROGRAM

## 2021-02-07 NOTE — Unmapped (Signed)
Lab on 02/07/2021   Component Date Value Ref Range Status    Sodium 02/07/2021 139  135 - 145 mmol/L Final    Potassium 02/07/2021 3.6  3.4 - 4.8 mmol/L Final    Chloride 02/07/2021 106  98 - 107 mmol/L Final    CO2 02/07/2021 24.0  20.0 - 31.0 mmol/L Final    Anion Gap 02/07/2021 9  5 - 14 mmol/L Final    BUN 02/07/2021 23  9 - 23 mg/dL Final    Creatinine 16/02/9603 1.06  0.60 - 1.10 mg/dL Final    BUN/Creatinine Ratio 02/07/2021 22   Final    eGFR CKD-EPI (2021) Male 02/07/2021 73  >=60 mL/min/1.60m2 Final    eGFR calculated with CKD-EPI 2021 equation in accordance with [employer] and [employer] of Nephrology Task Force recommendations.    Glucose 02/07/2021 114  70 - 179 mg/dL Final    Calcium 54/01/8118 9.0  8.7 - 10.4 mg/dL Final    Albumin 14/78/2956 2.8 (A) 3.4 - 5.0 g/dL Final    Total Protein 02/07/2021 5.8  5.7 - 8.2 g/dL Final    Total Bilirubin 02/07/2021 1.6 (A) 0.3 - 1.2 mg/dL Final    AST 21/30/8657 25  <=34 U/L Final    ALT 02/07/2021 11  10 - 49 U/L Final    Alkaline Phosphatase 02/07/2021 118 (A) 46 - 116 U/L Final    Magnesium 02/07/2021 1.5 (A) 1.6 - 2.6 mg/dL Final    Phosphorus 84/69/6295 2.1 (A) 2.4 - 5.1 mg/dL Final    LDH 28/41/3244 236  120 - 246 U/L Final    Uric Acid 02/07/2021 4.7  3.7 - 9.2 mg/dL Final    WBC 05/08/7251 0.2 (A) 3.6 - 11.2 10*9/L Final    RBC 02/07/2021 2.97 (A) 4.26 - 5.60 10*12/L Final    HGB 02/07/2021 9.5 (A) 12.9 - 16.5 g/dL Final    HCT 66/44/0347 26.6 (A) 39.0 - 48.0 % Final    MCV 02/07/2021 89.6  77.6 - 95.7 fL Final    MCH 02/07/2021 31.9  25.9 - 32.4 pg Final    MCHC 02/07/2021 35.6  32.0 - 36.0 g/dL Final    RDW 42/59/5638 16.9 (A) 12.2 - 15.2 % Final    MPV 02/07/2021 7.5  6.8 - 10.7 fL Final    Platelet 02/07/2021 13 (A) 150 - 450 10*9/L Final    Neutrophils % 02/07/2021 30.3  % Final    Lymphocytes % 02/07/2021 58.8  % Final    Monocytes % 02/07/2021 9.7  % Final    Eosinophils % 02/07/2021 1.2  % Final    Basophils % 02/07/2021 0.0  % Final    Absolute Neutrophils 02/07/2021 0.1 (A) 1.8 - 7.8 10*9/L Final    Absolute Lymphocytes 02/07/2021 0.1 (A) 1.1 - 3.6 10*9/L Final    Absolute Monocytes 02/07/2021 0.0 (A) 0.3 - 0.8 10*9/L Final    Absolute Eosinophils 02/07/2021 0.0  0.0 - 0.5 10*9/L Final    Absolute Basophils 02/07/2021 0.0  0.0 - 0.1 10*9/L Final    Anisocytosis 02/07/2021 Slight (A) Not Present Final

## 2021-02-07 NOTE — Unmapped (Signed)
Notified infusion APP regarding patient reporting 10/10 upper back pain not relieved by prescribed tramadol. Patient also reports that his feet feel like they are 50lbs. [facility] APP to chairside to assess patient. Okay to give treatment per APP. Patient completed and tolerated treatment.  AVS given and patient discharged to home.

## 2021-02-07 NOTE — Unmapped (Signed)
Diarrhea:  --It is great to hear that you have had no episodes of diarrhea today.   --Continue Imodium as previously instructed if it returns.       Leg Swelling:  --You should weigh yourself daily and record results.   --Continue Lasix daily as previously instructed and stop if your weight is down 5 pounds.  --Purchase compression stockings and apply them in the morning. Wear them during the day and remove them prior to bedtime.   --Keep legs elevated above the level of your heart to help decrease leg swelling.     Numbness/Tingling in feet:  --Take Gabapentin as prescribed. Medication prescribed In August 2022.   --If you do not have this medication at home notify your primary oncology team.   --Take care when walking. Use extra precautions and move slowly to prevent falls.     Back pain:  --You should increase the frequency of Tramadol you take. You can take it up to four times daily.  --START Tramadol every 6 hours scheduled for 24 hours to see if taking it more often will give you better pain relief. If not, notify your primary oncology team.       --You should drink at least 64oz of fluids daily (water, sports drinks) to prevent dehydration.         David Riley, ANP  ADVANCED PRACTICE PROVIDER FOR INFUSION PROGRAM          Lab Results   Component Value Date    WBC 0.2 (LL) 02/07/2021    HGB 9.5 (L) 02/07/2021    HCT 26.6 (L) 02/07/2021    PLT 13 (L) 02/07/2021       Lab Results   Component Value Date    NA 139 02/07/2021    K 3.6 02/07/2021    CL 106 02/07/2021    CO2 24.0 02/07/2021    BUN 23 02/07/2021    CREATININE 1.06 02/07/2021    GLU 114 02/07/2021    CALCIUM 9.0 02/07/2021    MG 1.5 (L) 02/07/2021    PHOS 2.1 (L) 02/07/2021       Lab Results   Component Value Date    BILITOT 1.6 (H) 02/07/2021    BILIDIR 0.20 07/06/2017    PROT 5.8 02/07/2021    ALBUMIN 2.8 (L) 02/07/2021    ALT 11 02/07/2021    AST 25 02/07/2021    ALKPHOS 118 (H) 02/07/2021    GGT 38 06/23/2012       Lab Results   Component Value Date    PT 14.1 (H) 01/19/2021    PT 14.9 (H) 01/19/2021    INR 1.24 01/19/2021    INR 1.30 01/19/2021    APTT 33.1 01/19/2021

## 2021-02-08 ENCOUNTER — Other Ambulatory Visit: Payer: Self-pay

## 2021-02-08 ENCOUNTER — Encounter: Payer: Self-pay | Admitting: *Deleted

## 2021-02-08 ENCOUNTER — Emergency Department: Payer: Medicare Other

## 2021-02-08 ENCOUNTER — Emergency Department
Admission: EM | Admit: 2021-02-08 | Discharge: 2021-02-08 | Disposition: A | Discharge status: Home / Self Care | Payer: Medicare Other | Source: Home / Self Care | Attending: Emergency Medicine | Admitting: Emergency Medicine

## 2021-02-08 DIAGNOSIS — R531 Weakness: Principal | ICD-10-CM

## 2021-02-08 DIAGNOSIS — Z96642 Presence of left artificial hip joint: Secondary | ICD-10-CM | POA: Insufficient documentation

## 2021-02-08 DIAGNOSIS — W01198A Fall on same level from slipping, tripping and stumbling with subsequent striking against other object, initial encounter: Secondary | ICD-10-CM | POA: Insufficient documentation

## 2021-02-08 DIAGNOSIS — Z79899 Other long term (current) drug therapy: Secondary | ICD-10-CM | POA: Insufficient documentation

## 2021-02-08 DIAGNOSIS — T83592A Infection and inflammatory reaction due to indwelling ureteral stent, initial encounter: Secondary | ICD-10-CM | POA: Diagnosis not present

## 2021-02-08 DIAGNOSIS — I509 Heart failure, unspecified: Secondary | ICD-10-CM | POA: Insufficient documentation

## 2021-02-08 DIAGNOSIS — Z7901 Long term (current) use of anticoagulants: Secondary | ICD-10-CM | POA: Insufficient documentation

## 2021-02-08 DIAGNOSIS — M545 Low back pain, unspecified: Secondary | ICD-10-CM | POA: Insufficient documentation

## 2021-02-08 DIAGNOSIS — N201 Calculus of ureter: Secondary | ICD-10-CM | POA: Diagnosis not present

## 2021-02-08 DIAGNOSIS — W19XXXA Unspecified fall, initial encounter: Secondary | ICD-10-CM

## 2021-02-08 DIAGNOSIS — E039 Hypothyroidism, unspecified: Secondary | ICD-10-CM | POA: Insufficient documentation

## 2021-02-08 LAB — URINALYSIS, COMPLETE (UACMP) WITH MICROSCOPIC
Bacteria, UA: NONE SEEN
Bilirubin Urine: NEGATIVE
Glucose, UA: NEGATIVE mg/dL
Ketones, ur: NEGATIVE mg/dL
Leukocytes,Ua: NEGATIVE
Nitrite: NEGATIVE
Protein, ur: 100 mg/dL — AB
RBC / HPF: >50 RBC/hpf — ABNORMAL HIGH (ref 0–5)
Specific Gravity, Urine: 1.009 (ref 1.005–1.030)
pH: 5 (ref 5.0–8.0)

## 2021-02-08 LAB — BASIC METABOLIC PANEL
Anion gap: 7 (ref 5–15)
BUN: 23 mg/dL (ref 8–23)
CO2: 23 mmol/L (ref 22–32)
Calcium: 8.1 mg/dL — ABNORMAL LOW (ref 8.9–10.3)
Chloride: 105 mmol/L (ref 98–111)
Creatinine, Ser: 1.15 mg/dL (ref 0.61–1.24)
GFR, Estimated: >60 mL/min (ref >60)
Glucose, Bld: 119 mg/dL — ABNORMAL HIGH (ref 70–99)
Potassium: 3.4 mmol/L — ABNORMAL LOW (ref 3.5–5.1)
Sodium: 135 mmol/L (ref 135–145)

## 2021-02-08 LAB — TROPONIN I (HIGH SENSITIVITY)
Troponin I (High Sensitivity): 4 ng/L (ref <18)
Troponin I (High Sensitivity): 5 ng/L (ref <18)

## 2021-02-08 LAB — CBC
HCT: 22.9 % — ABNORMAL LOW (ref 39.0–52.0)
Hemoglobin: 8.1 g/dL — ABNORMAL LOW (ref 13.0–17.0)
MCH: 32.4 pg (ref 26.0–34.0)
MCHC: 35.4 g/dL (ref 30.0–36.0)
MCV: 91.6 fL (ref 80.0–100.0)
Platelets: 10 10*3/uL — CL (ref 150–400)
RBC: 2.5 MIL/uL — ABNORMAL LOW (ref 4.22–5.81)
RDW: 17.9 % — ABNORMAL HIGH (ref 11.5–15.5)
WBC: 0.2 10*3/uL — CL (ref 4.0–10.5)
nRBC: 0 % (ref 0.0–0.2)

## 2021-02-08 IMAGING — CT CT CERVICAL SPINE W/O CM
2 series · 15 of 29 positions shown, 19 images · non-contrast
Comparison: CT cervical spine dated [DATE]

CLINICAL DATA: Fall

EXAM:
CT CERVICAL SPINE WITHOUT CONTRAST
TECHNIQUE: Multidetector CT imaging of the cervical spine was performed without
intravenous contrast. Multiplanar CT image reconstructions were also
generated.

[Series 3: c spine soft · axial · 0.36mm/px · z∈[+233,+363]mm · 10 of 77 slices shown, 13 images]
[im 6/77  soft-tissue]
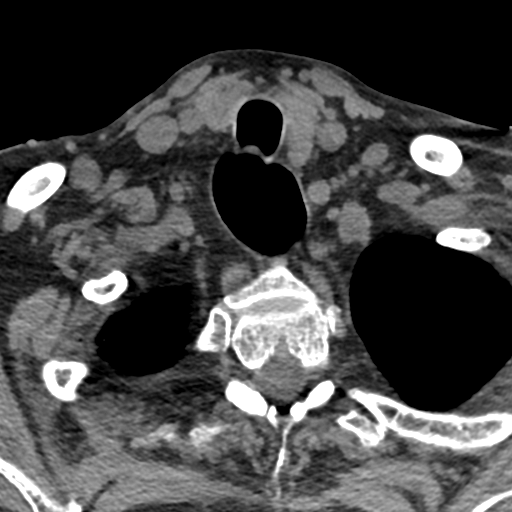
[im 6/77  bone]
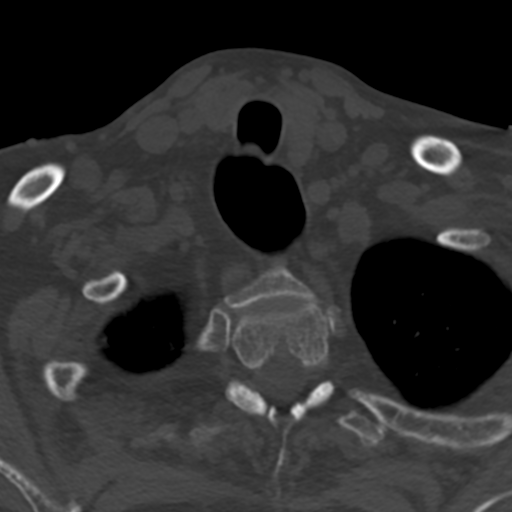
[im 12/77  bone]
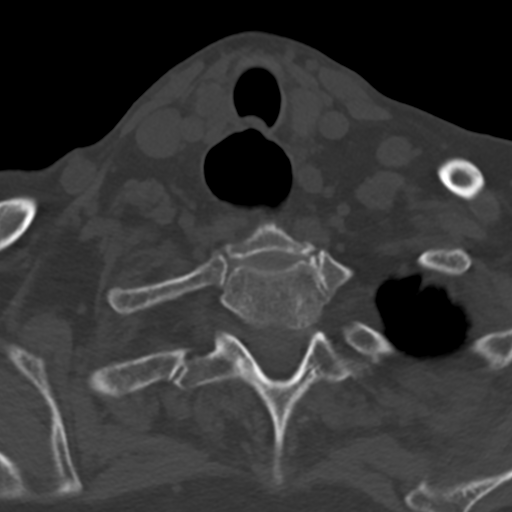
[im 24/77  bone]
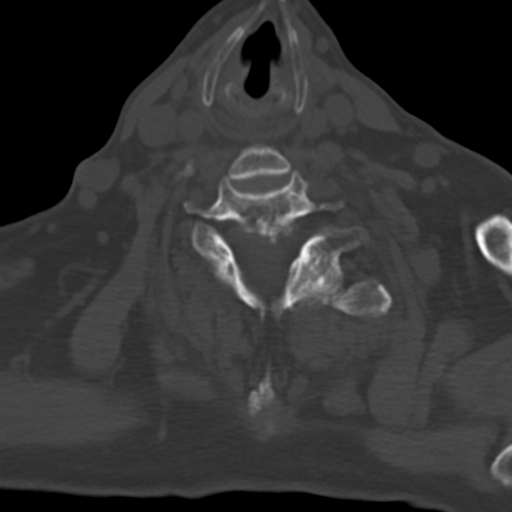
[im 30/77  bone]
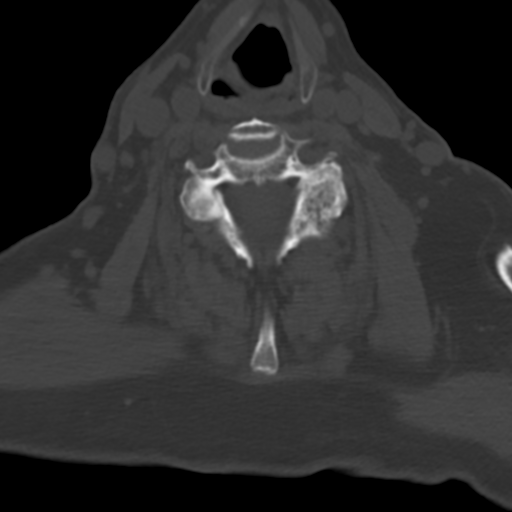
[im 36/77  soft-tissue]
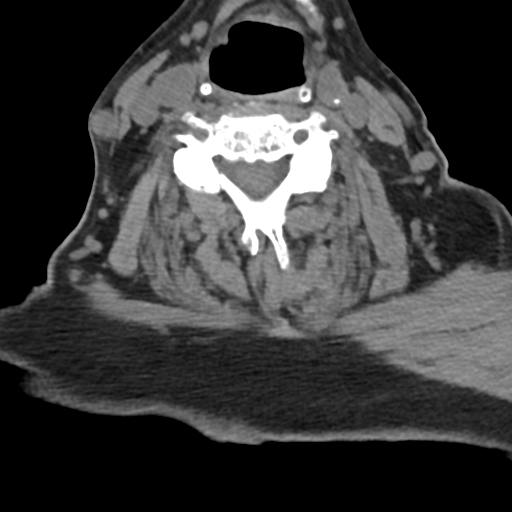
[im 36/77  bone]
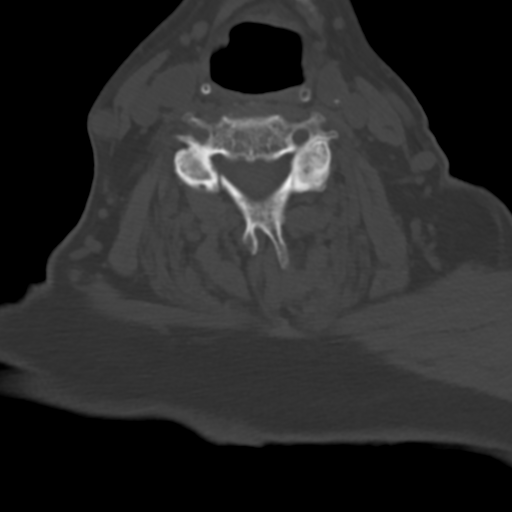
[im 41/77  bone]
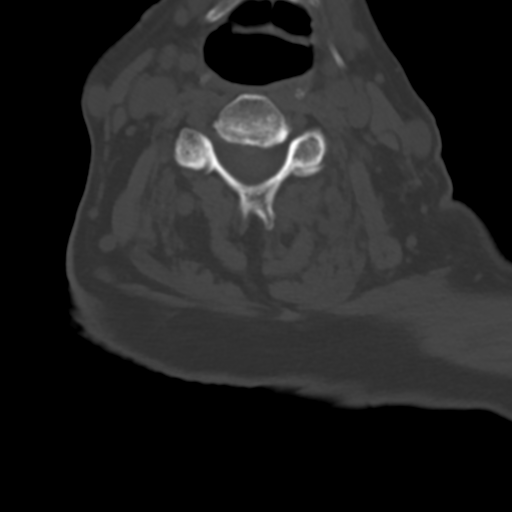
[im 47/77  bone]
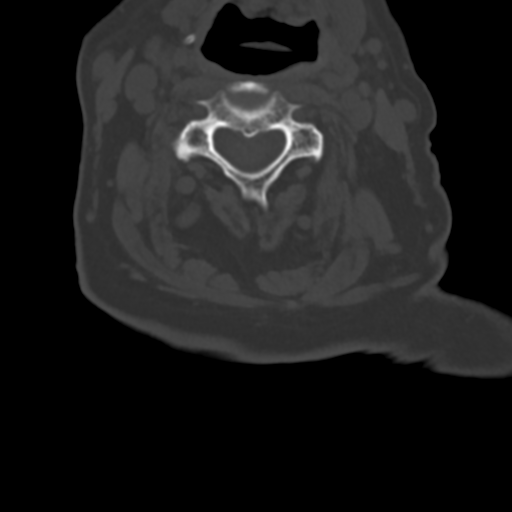
[im 59/77  bone]
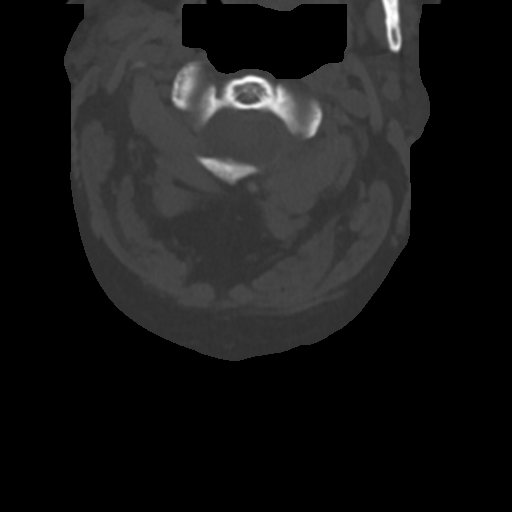
[im 65/77  soft-tissue]
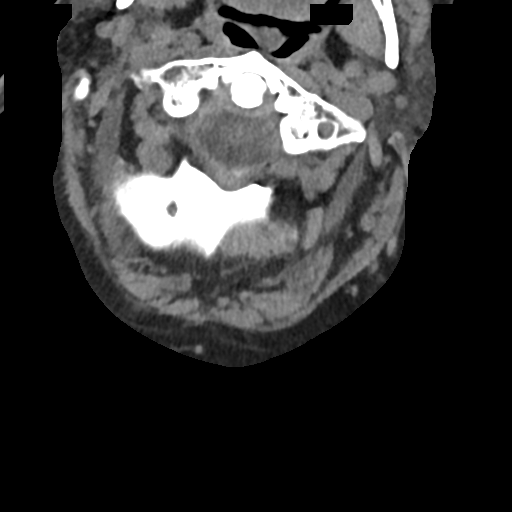
[im 65/77  bone]
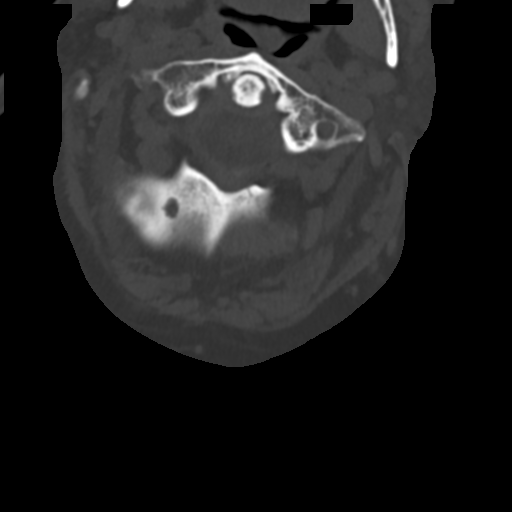
[im 71/77  bone]
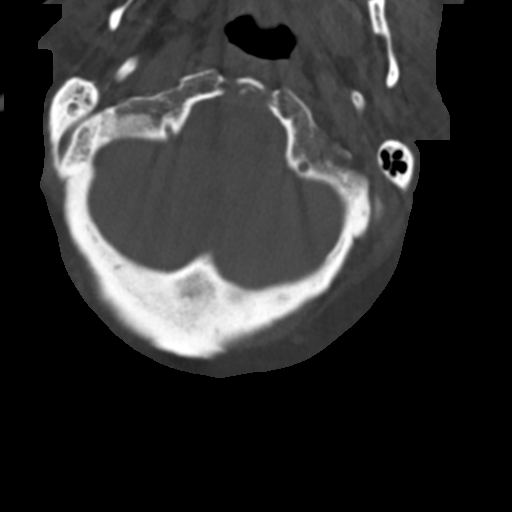

[Series 6: sagittal bone · sagittal · 0.34mm/px · 5 of 66 slices shown, 6 images]
[im 22/66  bone]
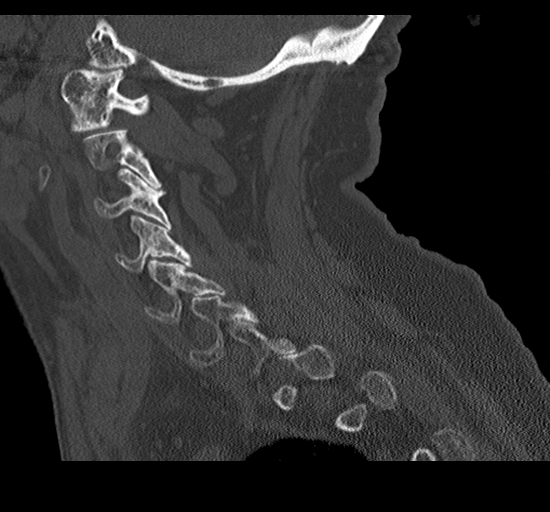
[im 28/66  bone]
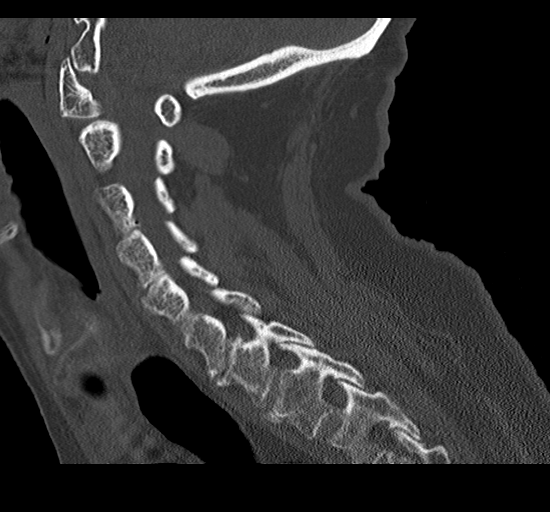
[im 33/66  soft-tissue]
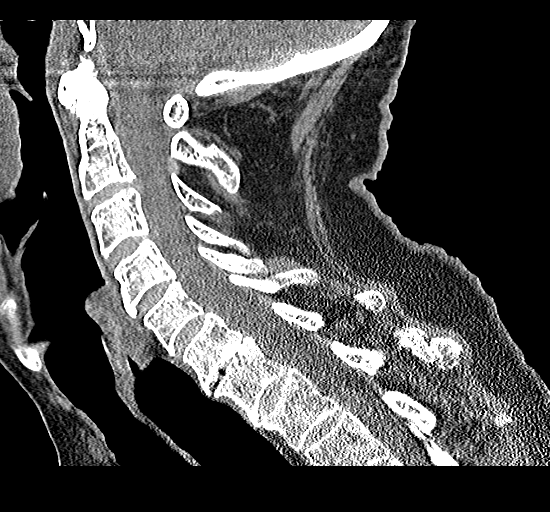
[im 33/66  bone]
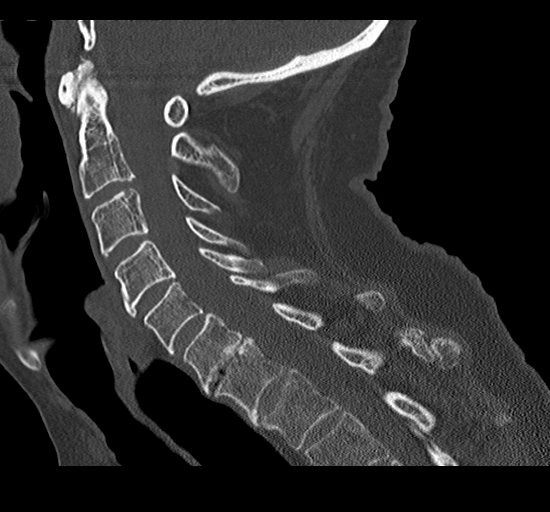
[im 38/66  bone]
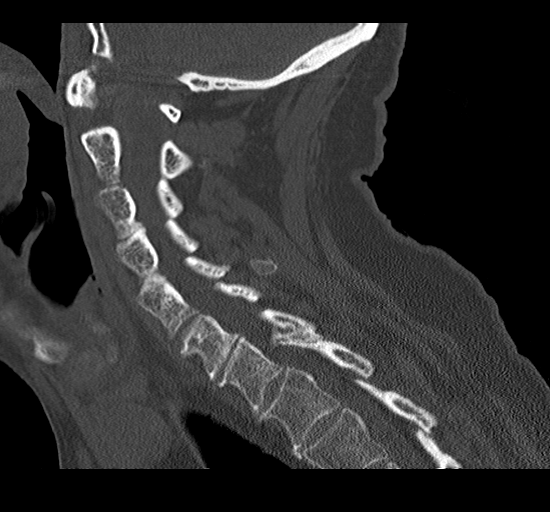
[im 44/66  bone]
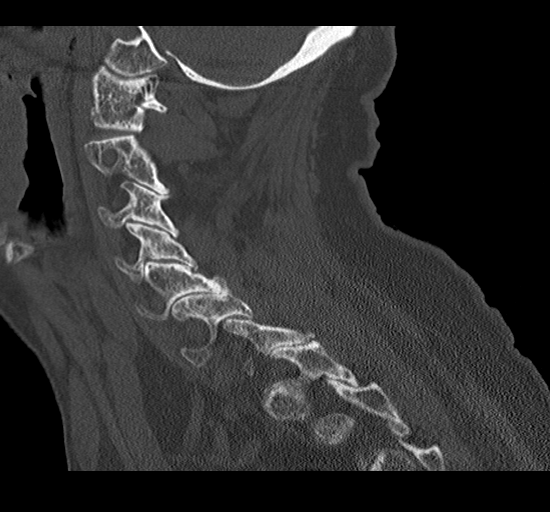

[15 of 29 positions shown; findings below may reference images not displayed]

FINDINGS: Alignment: Normal.

Skull base and vertebrae: No acute fracture. No primary bone lesion
or focal pathologic process.

Soft tissues and spinal canal: No prevertebral fluid or swelling. No
visible canal hematoma.

Disc levels: Multilevel degenerative disc disease, most pronounced
at C6-C7. Mild facet arthropathy.

Upper chest: Partially visualized right pleural effusion.

Other: None.
IMPRESSION: No CT evidence of acute cervical spine injury.

Partially visualized right pleural effusion.

## 2021-02-08 IMAGING — CR DG LUMBAR SPINE 2-3V
1 series · 3 of 3 positions shown · non-contrast
Comparison: Lumbar spine MRI [DATE]. CT abdomen and pelvis
[DATE].

CLINICAL DATA: Fall today.  Low back pain.  History of leukemia.

EXAM:
LUMBAR SPINE - 2-3 VIEW

[Series 1: dg lumbar spine 2-3 views · 0.14mm/px · 3 of 3 slices shown]
[im 1/3]
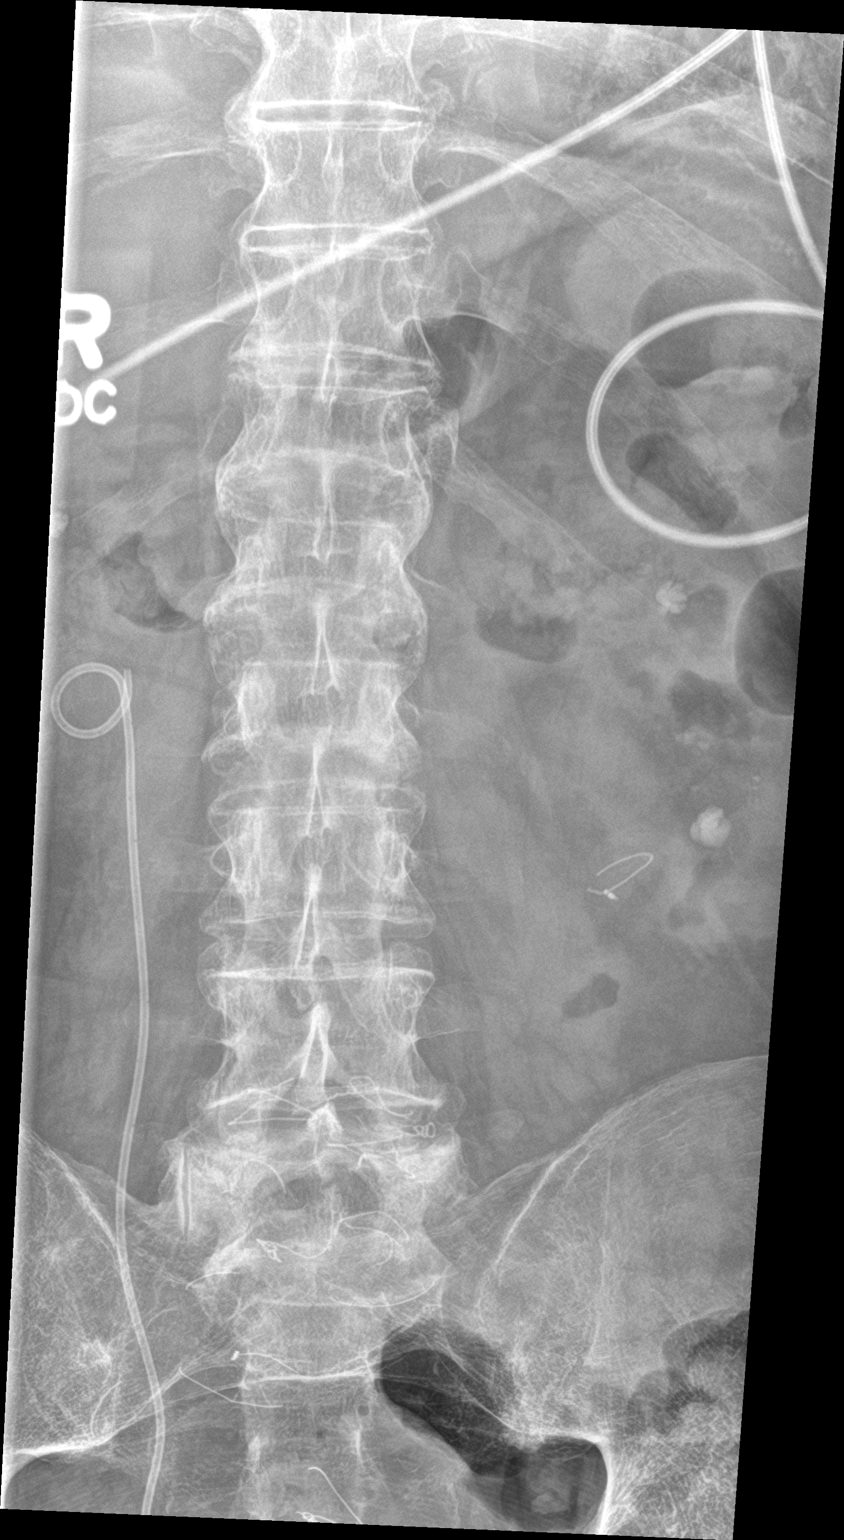
[im 2/3]
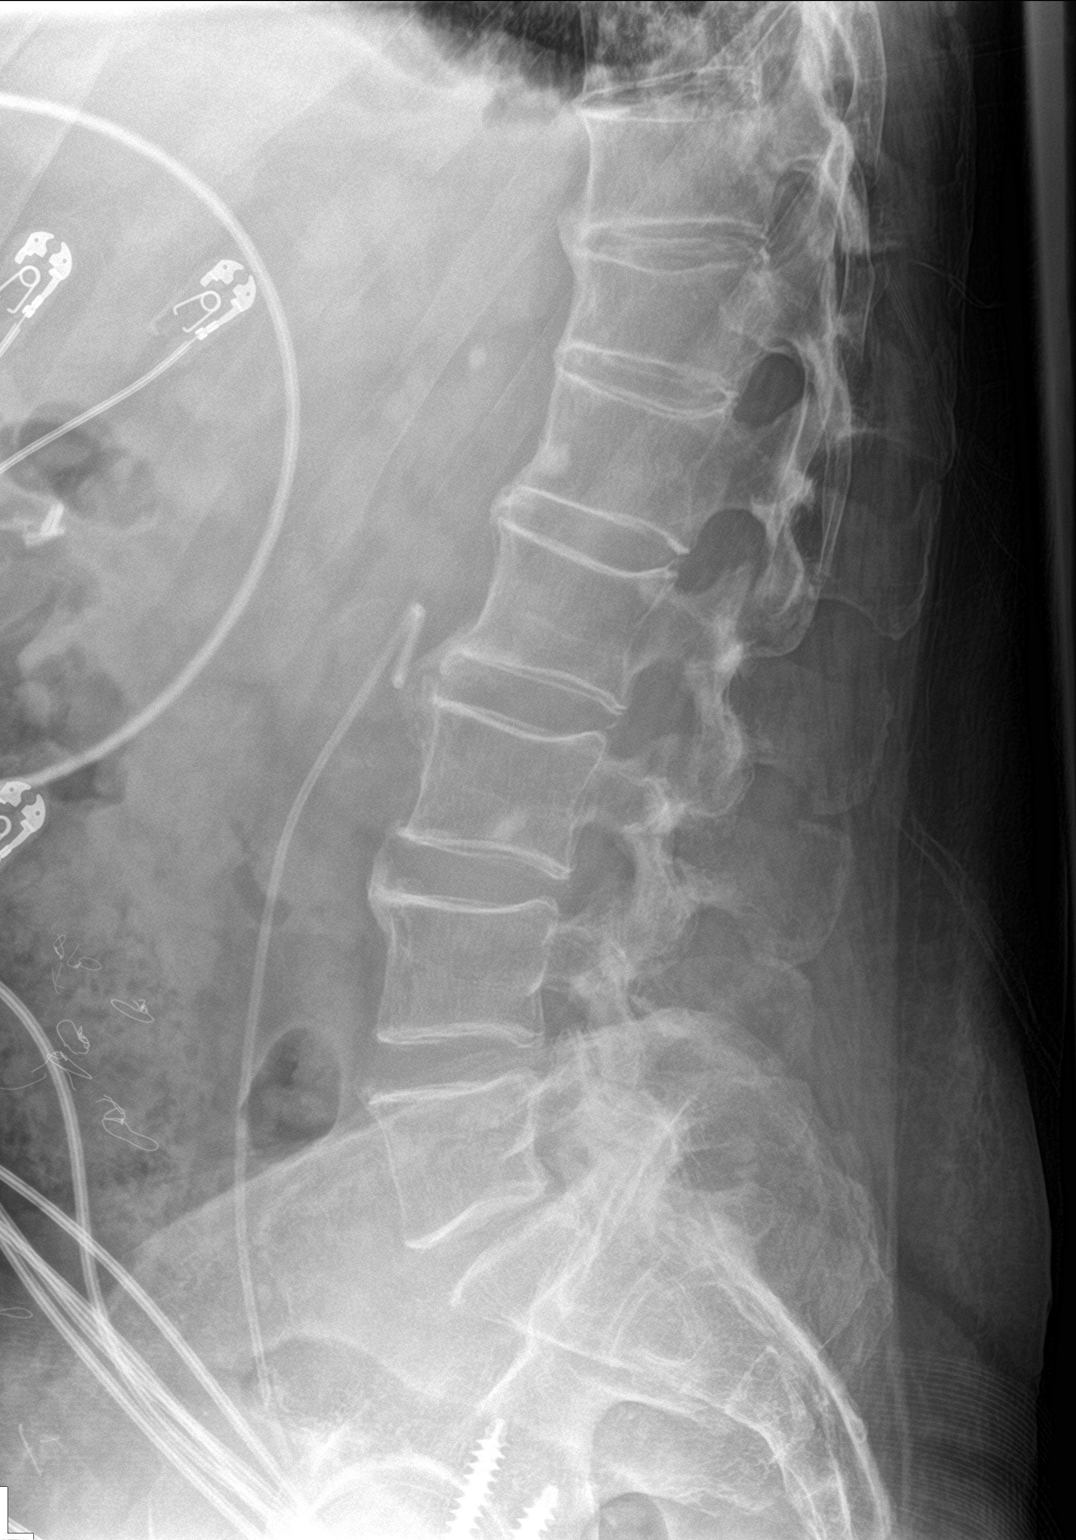
[im 3/3]
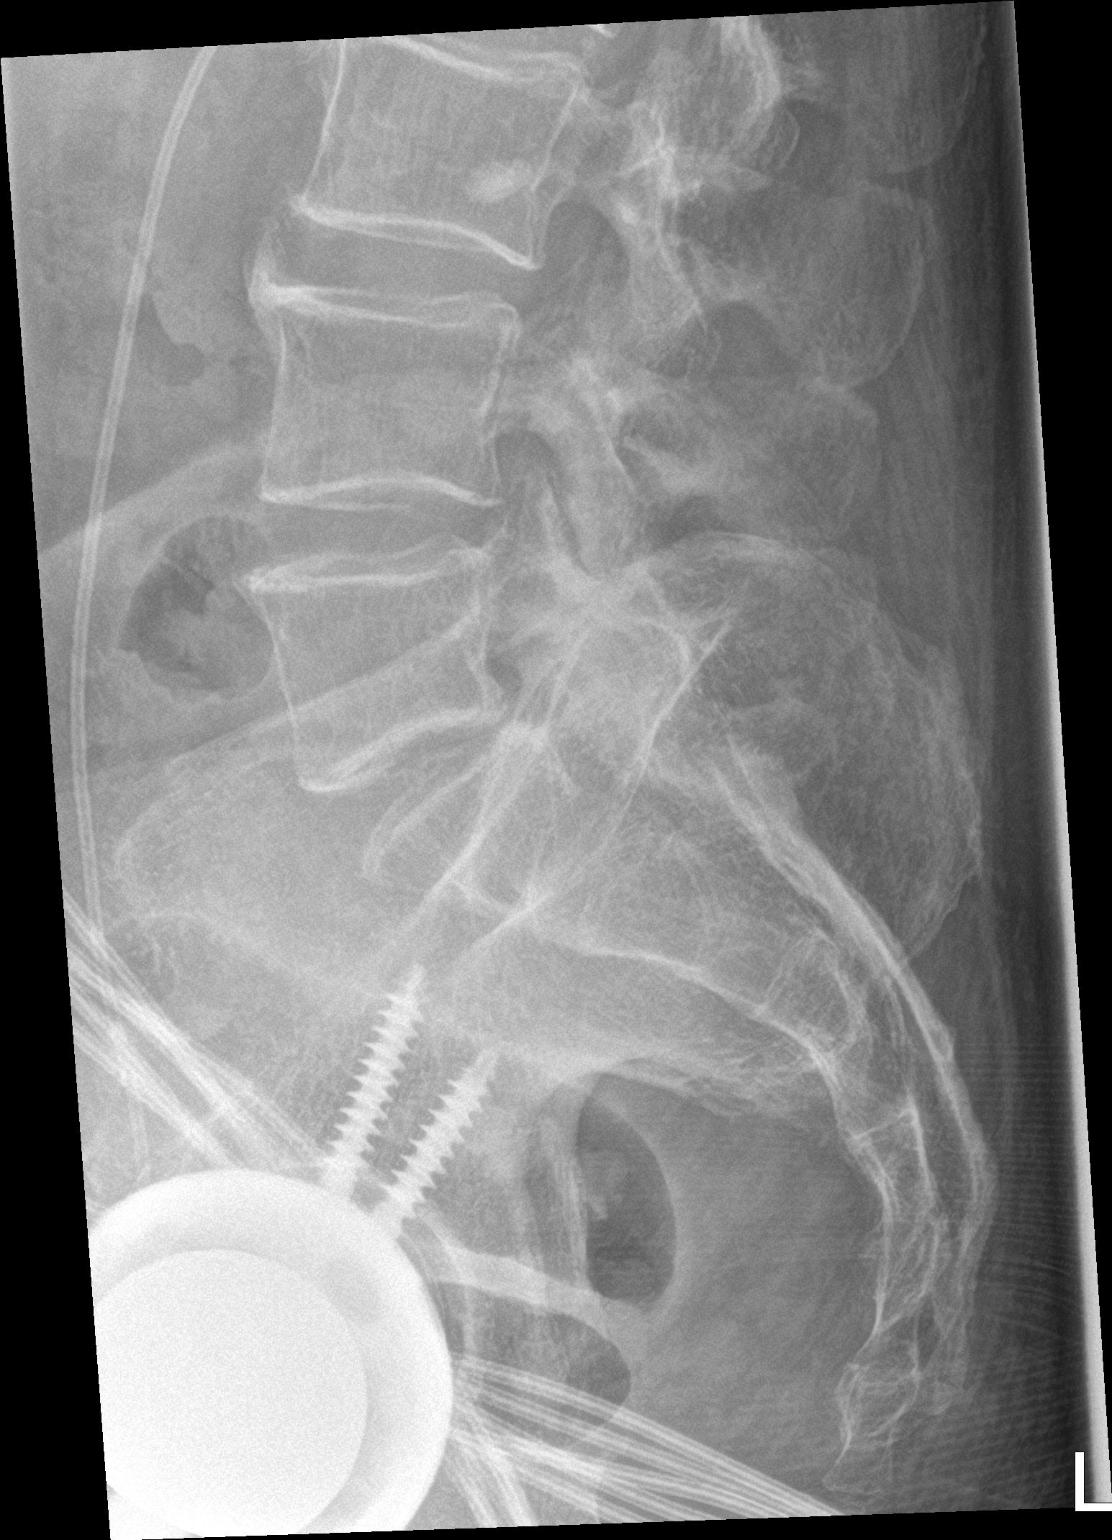

[3 of 3 positions shown; findings below may reference images not displayed]

FINDINGS: There are 5 non rib-bearing lumbar type vertebrae. Trace
anterolisthesis of L5 on S1 is unchanged. A mild chronic T12
superior endplate compression deformity is unchanged. No acute
fracture is identified. There is unchanged mild disc space narrowing
at L5-S1. Lower lumbar facet arthrosis is asymmetrically severe on
the right at L5-S1. A right-sided ureteral stent and bilateral renal
calculi are partially visualized. Prior right hip arthroplasty is
noted.
IMPRESSION: No acute osseous abnormality identified.

## 2021-02-08 IMAGING — CT CT HEAD W/O CM
4 series · 17 of 47 positions shown, 19 images · non-contrast
Comparison: Head CT dated [DATE]

CLINICAL DATA: Fall

EXAM:
CT HEAD WITHOUT CONTRAST
TECHNIQUE: Contiguous axial images were obtained from the base of the skull
through the vertex without intravenous contrast.

[Series 2: head wo · axial · 0.42mm/px · z∈[+384,+499]mm · 6 of 33 slices shown, 8 images]
[im 5/33  brain]
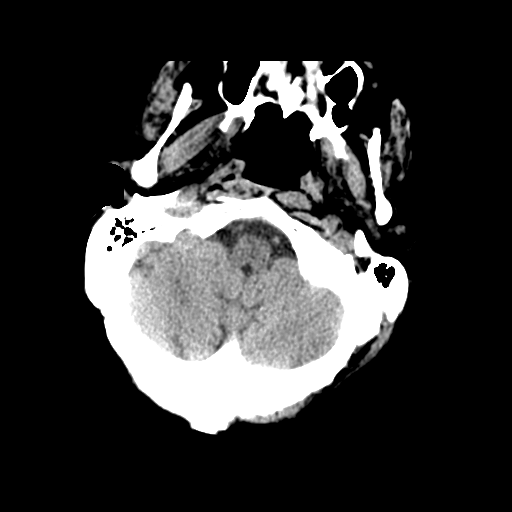
[im 5/33  bone]
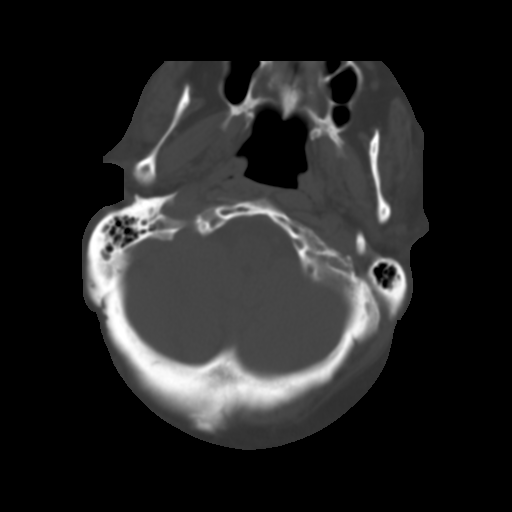
[im 10/33  brain]
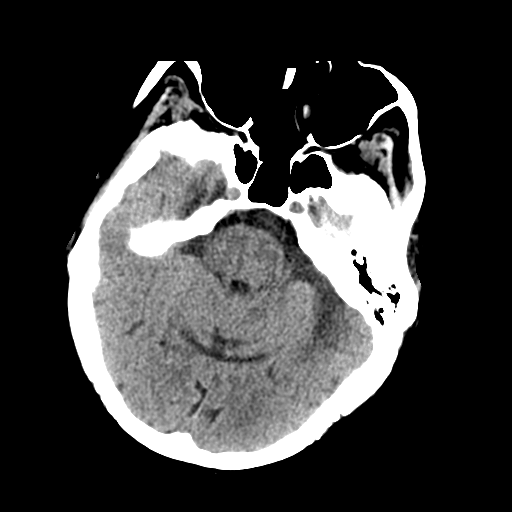
[im 14/33  brain]
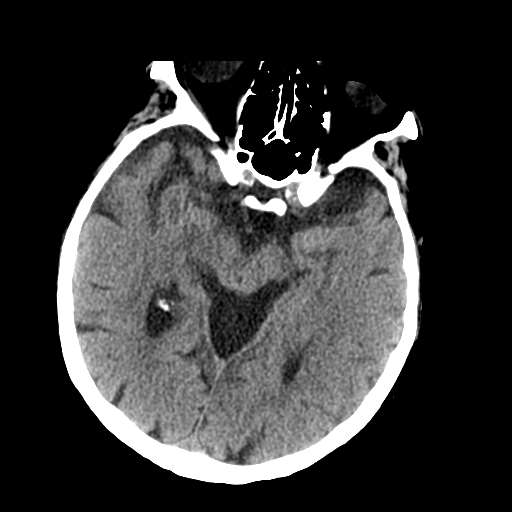
[im 19/33  brain]
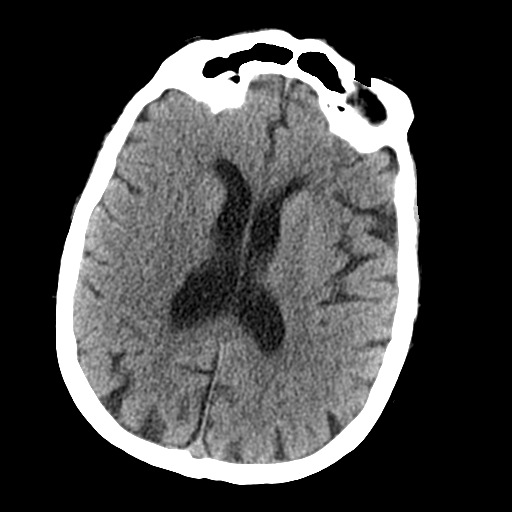
[im 23/33  brain]
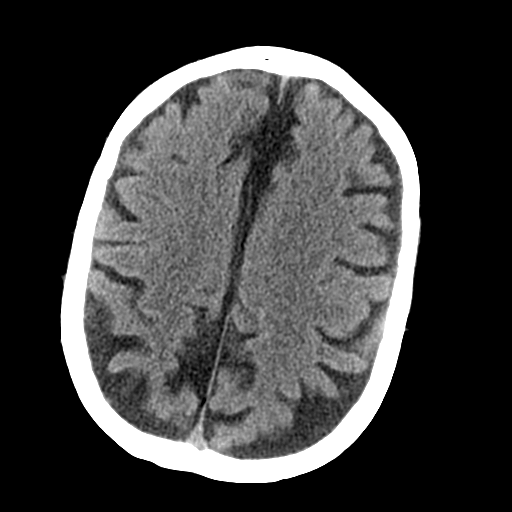
[im 23/33  bone]
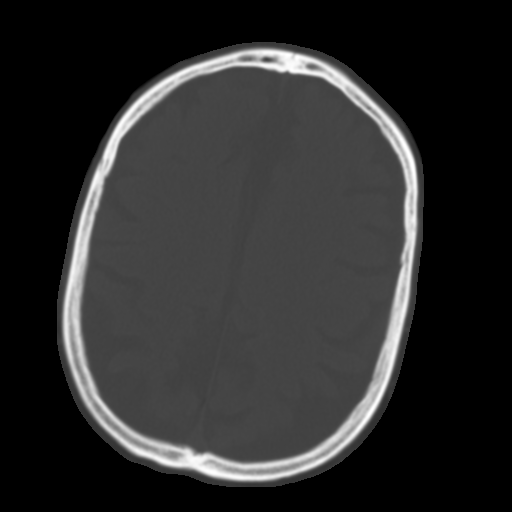
[im 28/33  brain]
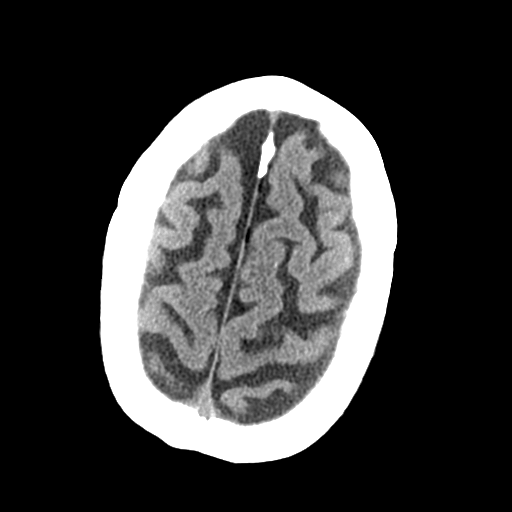

[Series 3: head bone · axial · 0.42mm/px · z∈[+378,+458]mm · 5 of 84 slices shown]
[im 8/84  bone]
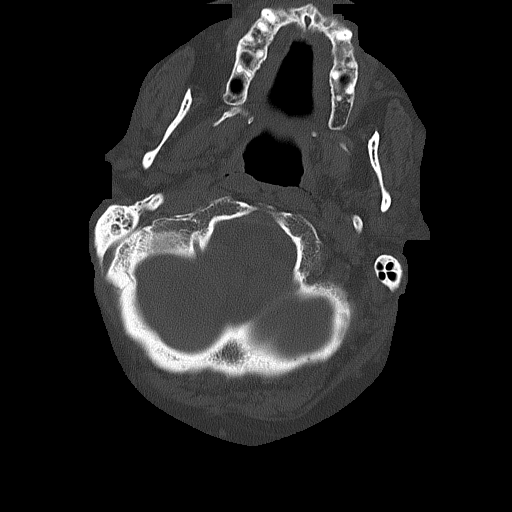
[im 16/84  bone]
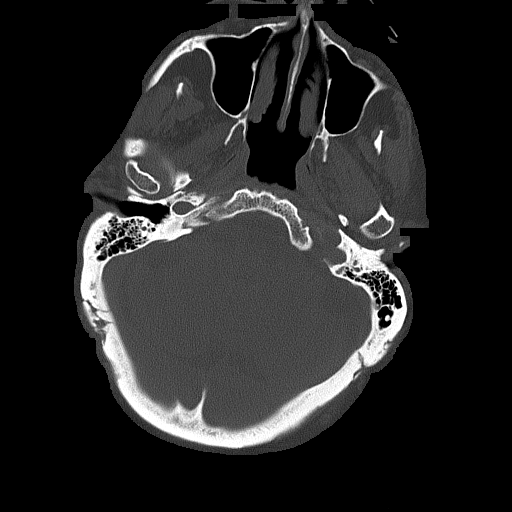
[im 28/84  bone]
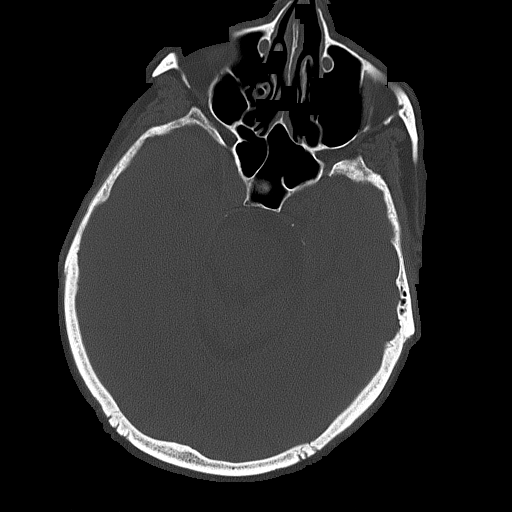
[im 36/84  bone]
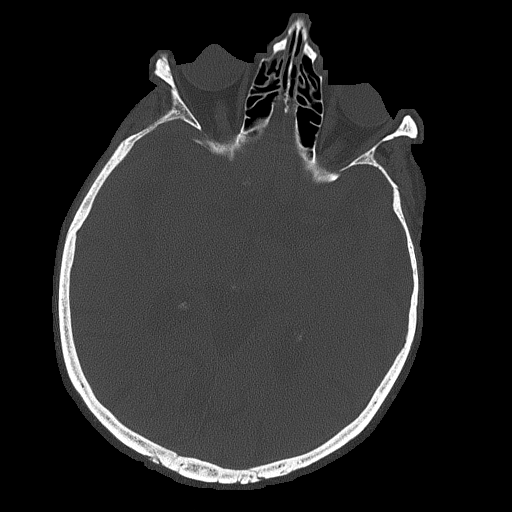
[im 48/84  bone]
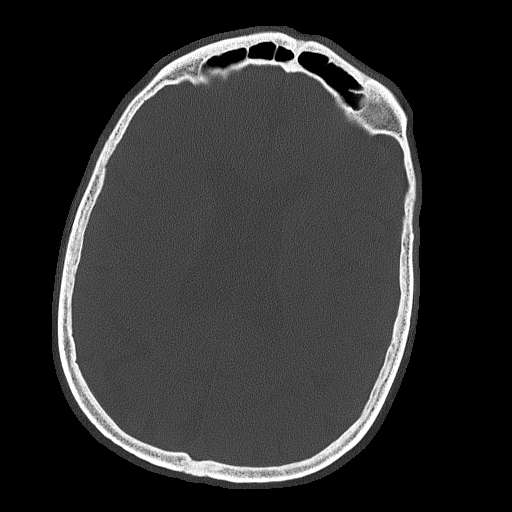

[Series 4: coronal soft tissue · coronal · 0.32mm/px · 3 of 74 slices shown]
[im 27/74  brain]
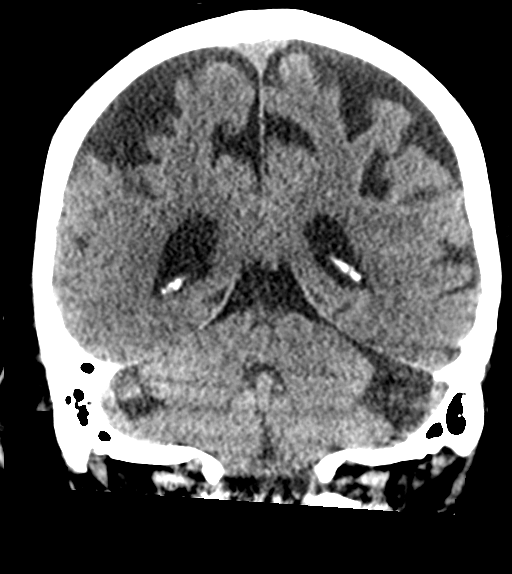
[im 34/74  brain]
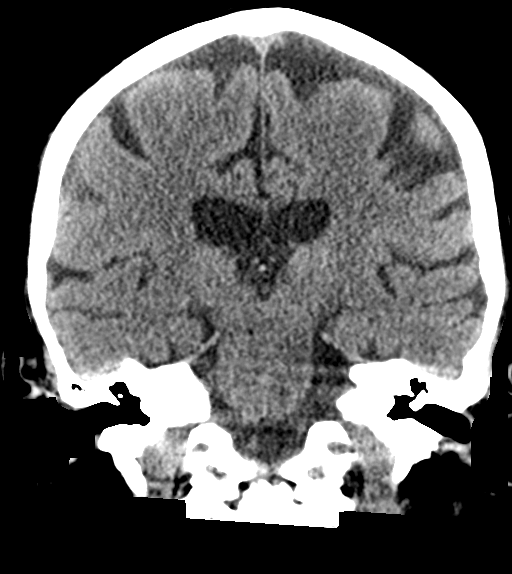
[im 40/74  brain]
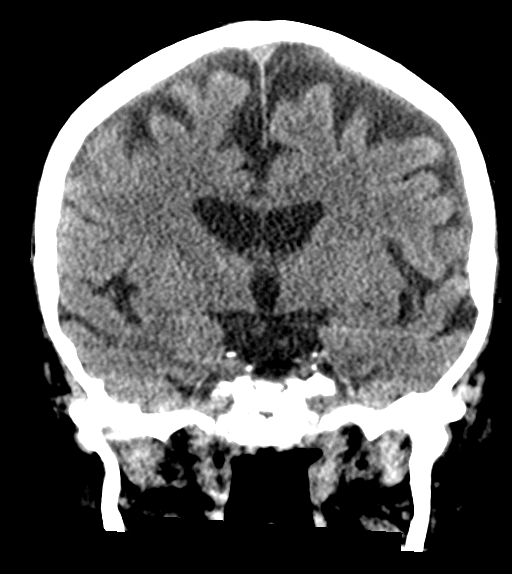

[Series 5: sagittal soft tissue · sagittal · 0.36mm/px · 3 of 53 slices shown]
[im 18/53  brain]
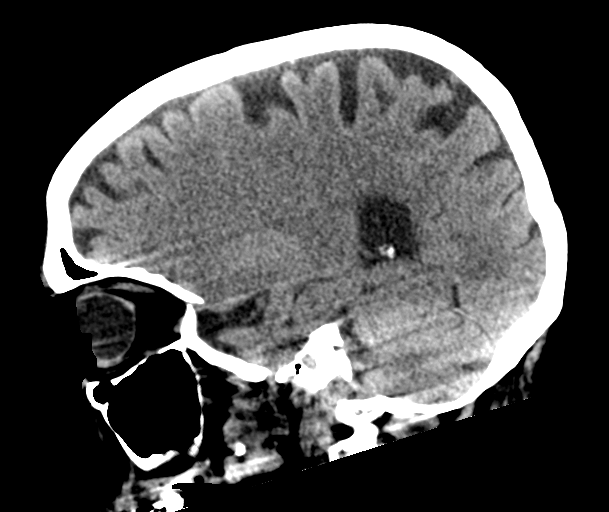
[im 27/53  brain]
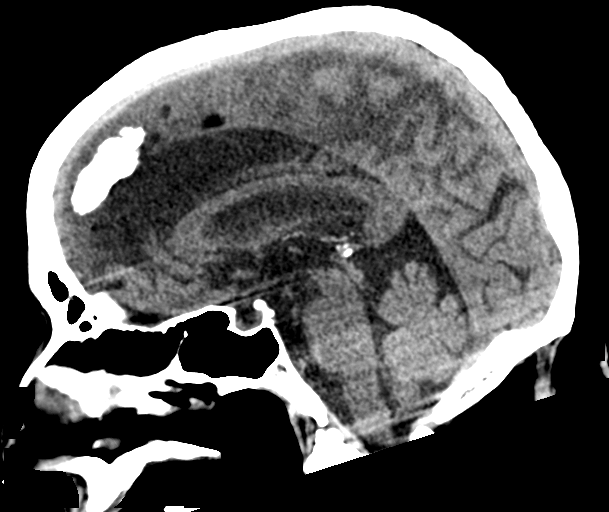
[im 35/53  brain]
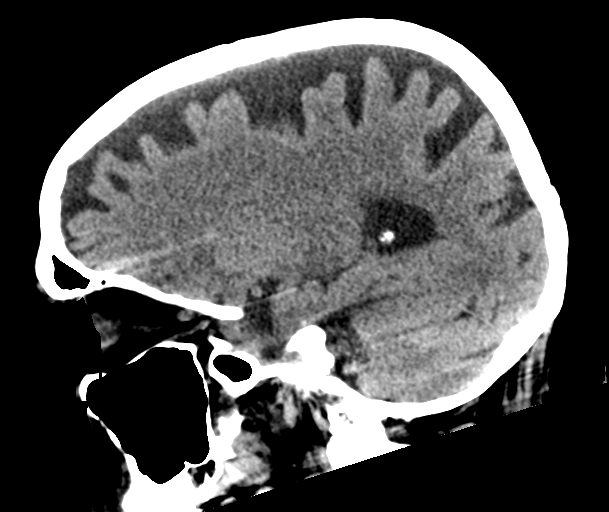

[17 of 47 positions shown; findings below may reference images not displayed]

FINDINGS: Brain: Chronic white matter ischemic change. Mild generalized
atrophy, likely age related. No evidence of acute infarction,
hemorrhage, hydrocephalus, extra-axial collection or mass
lesion/mass effect.

Vascular: No hyperdense vessel or unexpected calcification.

Skull: Normal. Negative for fracture or focal lesion.

Sinuses/Orbits: No acute finding.

Other: None.
IMPRESSION: No acute intracranial abnormality.

## 2021-02-08 NOTE — ED Notes (Signed)
[facility] called to transport back to residence

## 2021-02-08 NOTE — Discharge Instructions (Addendum)
Please seek medical attention for any high fevers, chest pain, shortness of breath, change in behavior, persistent vomiting, bloody stool or any other new or concerning symptoms.  

## 2021-02-08 NOTE — ED Notes (Signed)
Pt was on plavix 2 weeks and stopped.  No neck or back pain after the fall.  Pt alert  iv in place.

## 2021-02-08 NOTE — ED Notes (Signed)
This RN to bedside, introduced self to patient, pt requesting something to drink, provided something to drink by this RN, urinal emptied by this RN. Pt requesting to know why the delay with EMS transport, this RN apologized and explained delay. Pt states understanding at this time.

## 2021-02-08 NOTE — ED Notes (Signed)
Pt D/C into the care of [facility] for transport home. Pt visualized in NAD at this time. VSS at time of D/C.

## 2021-02-08 NOTE — ED Provider Notes (Addendum)
[facility] Emergency Department Provider Note   ____________________________________________   I have reviewed the triage vital signs and the nursing notes.   HISTORY  Chief Complaint Fall   History limited by: Not Limited   HPI Gordon Farrell is a 75 y.o. male who presents to the emergency department today after a fall. The patient just finished seven days of chemotherapy at [facility]. This morning he states that he was feeling weak and off balance. Went to sit on his recliner when he fell over after losing his balance and striking his head against the ottoman. Throughout the day he is complaining of generalized pain. The patient contacted his oncologists at [facility] and they recommended that he go to the emergency department for evaluation.   Records reviewed. Per medical record review patient has a history of low platelets, white count and anemia.   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   Compression fracture of lumbar vertebra ([place]) 03/08/2015   Crohn's disease of large intestine ([place]) 03/08/2015   Deep vein thrombosis (DVT) ([place]) 03/08/2015   Psychogenic cyclical vomiting 27/07/5007    Past Surgical History:  Procedure Laterality Date   BACK SURGERY     CHOLECYSTECTOMY     COLON RESECTION     multiple times   LEFT HEART CATH AND CORONARY ANGIOGRAPHY N/A 10/23/2020   Procedure: LEFT HEART CATH AND CORONARY ANGIOGRAPHY with coronary intervention;  Surgeon: Dionisio David, MD;  Location:  [place] [facility];  Service: Cardiovascular;  Laterality: N/A;   TOTAL HIP ARTHROPLASTY  05/06/2010    Prior to Admission medications   Medication Sig Start Date End Date Taking? Authorizing Provider  apixaban (ELIQUIS) 2.5 MG TABS tablet Take 1 tablet (2.5 mg total) by mouth 2 (two) times daily. 11/04/20   Terrilee Croak, MD  atorvastatin (LIPITOR) 40 MG tablet Take 40 mg by mouth daily. 08/15/20   [provider]  finasteride (PROSCAR) 5 MG tablet Take 5 mg by mouth daily. 10/04/20   [provider]  gabapentin (NEURONTIN) 100 MG capsule Take 100 mg by mouth 3 (three) times daily. 09/28/20   [provider]  latanoprost (XALATAN) 0.005 % ophthalmic solution Place 1 drop into both eyes at bedtime.    [provider]  levothyroxine (SYNTHROID) 100 MCG tablet Take 100 mcg by mouth daily. 01/11/20 01/10/21  [provider]  metoprolol tartrate (LOPRESSOR) 25 MG tablet Take 1 tablet (25 mg total) by mouth 2 (two) times daily. 10/23/20   Sharen Hones, MD  pantoprazole (PROTONIX) 40 MG tablet Take 1 tablet (40 mg total) by mouth daily. 10/23/20 11/22/20  Sharen Hones, MD  pilocarpine (SALAGEN) 5 MG tablet Take 5 mg by mouth in the morning, at noon, and at bedtime. 08/11/20 08/11/21  [provider]  pyridoxine (B-6) 100 MG tablet Take 100 mg by mouth daily.    [provider]  sertraline (ZOLOFT) 100 MG tablet Take 100 mg by mouth daily. 05/23/20 05/23/21  [provider]  timolol (TIMOPTIC) 0.5 % ophthalmic  solution Place 1 drop into the right eye 2 (two) times daily.    [provider]  traZODone (DESYREL) 100 MG tablet Take 100 mg by mouth at bedtime. 08/25/20   [provider]    Allergies Patient has no known allergies.  Family History  Problem Relation Age of Onset   Osteoporosis Mother     Social History Social History   Tobacco Use   Smoking status: Never   Smokeless tobacco: Never  Vaping Use   Vaping Use:  Never used  Substance Use Topics   Alcohol use: No    Alcohol/week: 0.0 standard drinks   Drug use: Never    Review of Systems Constitutional: No fever/chills. Positive for generalized weakness. Eyes: No visual changes. ENT: No sore throat. Cardiovascular: Denies chest pain. Respiratory: Denies shortness of breath. Gastrointestinal: No abdominal pain.  No nausea, no vomiting.  No diarrhea.   Genitourinary: Negative for dysuria. Musculoskeletal: Positive for back pain, positive for generalized pain. Skin: Negative for rash. Neurological: Positive for balance issues.   ____________________________________________   PHYSICAL EXAM:  VITAL SIGNS: ED Triage Vitals  Enc Vitals Group     BP 02/08/21 1522 113/64     Pulse Rate 02/08/21 1522 64     Resp 02/08/21 1522 20     Temp 02/08/21 1522 97.6 F (36.4 C)     Temp Source 02/08/21 1522 Oral     SpO2 02/08/21 1522 99 %     Weight 02/08/21 1523 188 lb (85.3 kg)     Height 02/08/21 1523 5\' 9"  (1.753 m)     Head Circumference --      Peak Flow --      Pain Score 02/08/21 1523 0   Constitutional: Alert and oriented.  Eyes: Conjunctivae are normal.  ENT      Head: Normocephalic and atraumatic.      Nose: No congestion/rhinnorhea.      Mouth/Throat: Mucous membranes are moist.      Neck: No stridor. Hematological/Lymphatic/Immunilogical: No cervical lymphadenopathy. Cardiovascular: Normal rate, regular rhythm.  No murmurs, rubs, or gallops.  Respiratory: Normal respiratory effort without tachypnea nor retractions. Breath sounds are clear and equal bilaterally. No wheezes/rales/rhonchi. Gastrointestinal: Soft and non tender. No rebound. No guarding.  Genitourinary: Deferred Musculoskeletal: Normal range of motion in all extremities. No lower extremity edema. Neurologic:  Normal speech and language. No gross focal neurologic deficits are appreciated.  Skin:  Skin is warm, dry and intact. No rash noted. Psychiatric: Mood and  affect are normal. Speech and behavior are normal. Patient exhibits appropriate insight and judgment.  ____________________________________________    LABS (pertinent positives/negatives)  CBC wbc 0.2, hgb 8.1, plt 10 Trop hs 4 BMP na 135, k 3.4, glu 119, cr 1.15 UA cloudy, hgb moderate, protein 100, rbc >50, wbc 0-5 ____________________________________________   EKG  I, Nance Pear, attending physician, personally viewed and interpreted this EKG  EKG Time: 1526 Rate: 90 Rhythm: sinus rhythm Axis: normal Intervals: qtc 463 QRS: low voltage qrs ST changes: no st elevation Impression: abnormal ekg   ____________________________________________    RADIOLOGY  CT head No acute intracranial abnormality  CT cervical spine No evidence of acute injury of cervical spine  Lumbar spine No acute abnormality ____________________________________________   PROCEDURES  Procedures  ____________________________________________   INITIAL IMPRESSION / ASSESSMENT AND PLAN / ED COURSE  Pertinent labs & imaging results that were available during my care of the patient were reviewed by me and considered in my medical decision  making (see chart for details).   Patient presented to the emergency department today after suffering a fall.  Patient recently finished 7 days of chemo was off balance.  CT head and cervical spine here without any acute abnormality.  He was also complaining of some lumbar spine pain so x-ray was obtained which did not show any acute abnormality.  Blood work here does show significant derangements of the CBC although it is not significantly different from recent values taken at [facility] where patient is seen for oncology.  Did discuss with Dr. Ferdinand Lango with [facility] oncology over the telephone.  At this time did feel it was reasonable for patient be discharged home to follow-up in clinic.  I discussed this with the patient.   Patient's wife did apparently express some  concern about patient's ability to come home. I discussed with patient and offered overnight observation for physical therapy evaluation and possible placement tomorrow. Patient however declined. He states he wants to go home. Feels comfortable and safe going home.  ____________________________________________   FINAL CLINICAL IMPRESSION(S) / ED DIAGNOSES  Final diagnoses:  Fall, initial encounter     Note: This dictation was prepared with Diplomatic Services operational officer dictation. Any transcriptional errors that result from this process are unintentional     Nance Pear, MD 02/08/21 1950    Nance Pear, MD 02/08/21 2012

## 2021-02-08 NOTE — ED Triage Notes (Signed)
Pt brought in via ems from home.  Pt fell this am.  Pt has leukemia, chemo yesterday at [facility].  Pt struck head on wooden ottoman.  No loc  no vomiting.  Pt alert  speech clear.  Pt is weak after chemo.

## 2021-02-08 NOTE — ED Notes (Signed)
Wandra Feinstein for oncology consult [facility]

## 2021-02-08 NOTE — Unmapped (Signed)
RN spoke with patient's wife, I need to know why he is coming. He was just diagnosed with Leukemia. Need to cancel the appointment with [facility].

## 2021-02-08 NOTE — Unmapped (Signed)
Pt's wife states that he is being taken to the [facility] in [place].    Thanks  Yehuda Mao

## 2021-02-08 NOTE — Unmapped (Signed)
Returned call to [place], the patient's spouse.  The patient had a fall this morning and hit the back of his head on the ottoman.  He has not had any change in mental status, but has been confined to the recliner today as his lower legs are pretty swollen.  We discussed that his platelets are very low (13 yesterday 10/5) and he is at risk for bleeding.  He needs to be evaluated emergently.  She does not drive due to macular degeneration, so I recommended she call 911.  Jamesetta So reported understanding and was going to call as soon as we got off the call.  Will notify oncology team.

## 2021-02-08 NOTE — Unmapped (Signed)
Hi,     [name] contacted the [employer] regarding the following:    - States that Tamara took a fall today and hit his head on the ottoman, States that Stephens's feet/legs are so swollen that she wants to discuss if he needs a higher dose of lasix to assist with the swelling.    Please contact [name] at [phone].    Thanks in advance,    Rosary Lively  [facility]   [phone]

## 2021-02-09 ENCOUNTER — Ambulatory Visit: Admit: 2021-02-09 | Discharge: 2021-02-10 | Payer: MEDICARE | Attending: Adult Health | Primary: Adult Health

## 2021-02-09 ENCOUNTER — Ambulatory Visit: Admit: 2021-02-09 | Discharge: 2021-02-10 | Payer: MEDICARE

## 2021-02-09 ENCOUNTER — Other Ambulatory Visit: Admit: 2021-02-09 | Discharge: 2021-02-10 | Payer: MEDICARE

## 2021-02-09 DIAGNOSIS — C9200 Acute myeloblastic leukemia, not having achieved remission: Principal | ICD-10-CM

## 2021-02-09 LAB — CBC W/ AUTO DIFF
BASOPHILS ABSOLUTE COUNT: 0 10*9/L (ref 0.0–0.1)
BASOPHILS RELATIVE PERCENT: 0 %
EOSINOPHILS ABSOLUTE COUNT: 0 10*9/L (ref 0.0–0.5)
EOSINOPHILS RELATIVE PERCENT: 0.3 %
HEMATOCRIT: 27 % — ABNORMAL LOW (ref 39.0–48.0)
HEMOGLOBIN: 9.6 g/dL — ABNORMAL LOW (ref 12.9–16.5)
LYMPHOCYTES ABSOLUTE COUNT: 0.1 10*9/L — ABNORMAL LOW (ref 1.1–3.6)
LYMPHOCYTES RELATIVE PERCENT: 43.2 %
MEAN CORPUSCULAR HEMOGLOBIN CONC: 35.4 g/dL (ref 32.0–36.0)
MEAN CORPUSCULAR HEMOGLOBIN: 31.8 pg (ref 25.9–32.4)
MEAN CORPUSCULAR VOLUME: 89.6 fL (ref 77.6–95.7)
MEAN PLATELET VOLUME: 8.1 fL (ref 6.8–10.7)
MONOCYTES ABSOLUTE COUNT: 0 10*9/L — ABNORMAL LOW (ref 0.3–0.8)
MONOCYTES RELATIVE PERCENT: 4.1 %
NEUTROPHILS ABSOLUTE COUNT: 0.2 10*9/L — CL (ref 1.8–7.8)
NEUTROPHILS RELATIVE PERCENT: 52.4 %
PLATELET COUNT: 10 10*9/L — ABNORMAL LOW (ref 150–450)
RED BLOOD CELL COUNT: 3.01 10*12/L — ABNORMAL LOW (ref 4.26–5.60)
RED CELL DISTRIBUTION WIDTH: 16.2 % — ABNORMAL HIGH (ref 12.2–15.2)
WBC ADJUSTED: 0.3 10*9/L — CL (ref 3.6–11.2)

## 2021-02-09 LAB — COMPREHENSIVE METABOLIC PANEL
ALBUMIN: 2.7 g/dL — ABNORMAL LOW (ref 3.4–5.0)
ALKALINE PHOSPHATASE: 176 U/L — ABNORMAL HIGH (ref 46–116)
ALT (SGPT): 34 U/L (ref 10–49)
ANION GAP: 9 mmol/L (ref 5–14)
AST (SGOT): 42 U/L — ABNORMAL HIGH (ref <=34)
BILIRUBIN TOTAL: 1.5 mg/dL — ABNORMAL HIGH (ref 0.3–1.2)
BLOOD UREA NITROGEN: 19 mg/dL (ref 9–23)
BUN / CREAT RATIO: 17
CALCIUM: 8.5 mg/dL — ABNORMAL LOW (ref 8.7–10.4)
CHLORIDE: 104 mmol/L (ref 98–107)
CO2: 25 mmol/L (ref 20.0–31.0)
CREATININE: 1.12 mg/dL — ABNORMAL HIGH
EGFR CKD-EPI (2021) MALE: 69 mL/min/{1.73_m2} (ref >=60)
GLUCOSE RANDOM: 95 mg/dL (ref 70–179)
POTASSIUM: 3.7 mmol/L (ref 3.4–4.8)
PROTEIN TOTAL: 5.6 g/dL — ABNORMAL LOW (ref 5.7–8.2)
SODIUM: 138 mmol/L (ref 135–145)

## 2021-02-09 LAB — SLIDE REVIEW

## 2021-02-09 LAB — PLATELET COUNT: PLATELET COUNT: 12 10*9/L — ABNORMAL LOW (ref 150–450)

## 2021-02-09 LAB — MAGNESIUM: MAGNESIUM: 1.6 mg/dL (ref 1.6–2.6)

## 2021-02-09 NOTE — Unmapped (Signed)
Hematology/Oncology Outpatient Call    Caller: [facility]   Primary oncologist: Ze[facility]      David Riley is a 75 y.o. 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 C1D7 Aza/Ven 02/07/21.    Reason for Call: ED provider calling for recommendations/update    Coming in with weakness and balance issues. Had fall at home hit wooden part of his ottoman in the morning. CT head and neck w/o were negative for intracranial hemorrhage     WBC 0.2, Hb 8.1, Plt 10k - stable from prior    Afebrile. Vital signs stable per ED provider. Wanting to know if they can send him home.     I subsequently received a call from his wife who informed me that he is not doing well at home and stayed in his chair all day due to leg numbness and weakness. 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 I explained that I am not there to evaluate him in person, but she should explain this to the ED providers at [facility]. If they feel necessary to admit him we could plan to transfer him over once a bed is available.     Assessment/Plan:   - I discussed with the ED provider at [facility] that I would defer to their clinical judgement regarding readiness for discharge from the ED as I am not there in person to evaluate for myself.  - If admitted, he could be transferred over to [facility] ED pending bed availability  - Advised ED provider that medicine team could call back tomorrow AM to evaluate for bed availability. Would need to go to [facility] bed.  - I will make sure his primary oncologist is aware of this course of events.       Fellow taking call:  Malva Cogan, MD  February 08, 2021 7:42 PM

## 2021-02-09 NOTE — Unmapped (Addendum)
I have contacted the patient and his wife to review/clarify the following:    ?? On Thursday, February 08, 2021 David Riley experienced a ground-level fall while in the process of getting into his easy chair.  ?? The patient attributes his fall to losing his sense of balance while turning/pivoting to sit in the chair.    ?? Reassuringly, the patient has been and continues to use a walker with ambulation.    ?? David Riley was seen and evaluated in the emergency department at [facility] on 02/08/2021 and results from that encounter are available using Care Everywhere within Epic.  ?? Completed CT imaging and lab work indicated no immediate interventions or treatments are warranted while in the emergency department.  ?? David Riley does report having a series of bruises as a result of the ground-level fall.    ?? David Riley remains set for repeat lab and transfusion support later today, Friday, February 09, 2021 at [facility] and intends to proceed with that appointment as scheduled.    ?? ADDENDA 02/09/2021 at 10:55 AM:  ?? After reviewing the update noted above with David Gauss, NP, arrangements have been made for David Riley to be evaluated by one of the midlevel providers in the infusion center during today's lab and transfusion support visit.    ?? Follow-up in the clinic setting is tentatively set next for 03/02/2021 along with same-day lab work and transfusion support if needed.    Otherwise, the patient and his wife both expressed verbal understanding and had no other questions or concerns at this time.  They are amenable to the plan outlined above and will contact clinic back sooner should they have any other issues.    No other actions taken and this updates been shared with the Leukemia Care Team.

## 2021-02-10 NOTE — Unmapped (Signed)
Infusion Center Progress Note    Patient Name: David Riley  Patient Age: 75 y.o.  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 Repeat count 12. Platelet shortage so no additional unit able to be transfused. Will see about add-on Monday after call with NN    BLE edema-  --continue lasix 20mg  PO daily  --get compression stockings  --elevate legs above the level of the heart    David Riley and his wife verbalize understanding instructions provided. They have contact numbers for their primary oncology team and on-call fellow should questions or concerns arise after discharge from infusion. Additionally, they have lab + possible transfusion appointment scheduled on Tuesday February 21, 2021. Will have NN reach out to them on Monday 02/15/2021 to see how they are doing at home and discuss if they are interested in admission on Tuesday.     Subjective/HPI:  David Riley is a 75 y.o. 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    David Riley reports fall at home yesterday for which he was seen in his local ED. He has a bruise on his head and a couple on his arms from the fall. He states they did a CT scan of his head and he was fine, so they discharged him home. His wife states she was hoping they would transfer him to [facility], but there were no beds available.     When asked if he is having a difficult time at home, David Riley states no. He states he would be better if he didn't have to come to [facility] so often. He is looking forward to 3 days off since he isn't back for labs and transfusion again until Tuesday next week. He states he is getting around somewhat with a walker in the home, and mostly in a wheelchair out of the home. David Riley agrees with this and states EMS took him home from the hospital last night, but a daughter of their neighbor drove them to [facility] today and he was able to get in the car with minimal assist. David Riley states that his feet are numb, not burning or painful, so sometimes he trips or stumbles. He is taking gabapentin 100mg  TID, but doesn't know what good it's for since it doesn't fix the numbness. [name] also has some lower extremity edema, that makes his legs heavier to lift and maneuver for himself. His wife has yet to get him compression stockings but they are trying to elevate his legs higher at night in the bed. His legs are not elevated above the level of his heart for the day.     David Riley states her husband could stand to eat and drink more. He states he just doesn't have much of an appetite. Reports he is moving his bowels and bladder without issue.     Discussed care at home with the [facility]. 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 He does not think he needs a SNF currently, but hasn't ruled out that he may need to consider receiving SNF level care in the near future. He would like to continue to try at home with his wife and [facility] PT/OT. PT and OT saw him the day before his fall. I asked his wife is she was comfortable taking him home to continue to care for him in the home today and she stated yes. They have help close by if they need it. Additionally, I provided the phone number for the on-call fellow this weekend if they have any issues. I discussed with both of them, that if he continues to struggle this weekend with his mobility and general care at home, that they should be open about it so we can put him in the hospital for more medical interventions. Additionally, I asked their NN, David Riley, to reach out to them on Monday to see how they are doing.     Review of Systems:  Review of Systems   Constitutional: Positive for appetite change and fatigue. Negative for chills, diaphoresis and fever.   HENT:   Negative for mouth sores and trouble swallowing.    Respiratory: Negative for cough, shortness of breath and wheezing.    Cardiovascular: Positive for leg swelling. Negative for chest pain and palpitations.   Gastrointestinal: Negative for abdominal pain, constipation, diarrhea, nausea and vomiting.   Genitourinary: Negative for difficulty urinating and dysuria.    Musculoskeletal: Positive for back pain.   Skin: Positive for wound.        Abrasion and knot on right side of head from fall with head hit on side of ottoman   Neurological: Positive for extremity weakness.   Hematological: Bruises/bleeds easily.        Bruising on upper arms. Looking forward to getting port due to multiple IV sticks just to get labs and access   Psychiatric/Behavioral: Negative.        Oncology History:  Oncology History Overview Note   Referring/Local Oncologist: None    Diagnosis: Acute Myeloid Leukemia    Genetics:   Karyotype/FISH: 35 XY     Molecular Genetics:         Variants of Known/Likely Clinical Significance:   Gene Coding Predicted Protein Variant allele fraction   ASXL1 c.1782C>A p.(Cys594Ter) 22.6 %   CSF3R c.2326C>T p.(Gln776Ter) 4.9 %   IDH1 c.394C>T p.(Arg132Cys) 12.5 %   U2AF1 c.470A>C p.(Gln157Pro) 24.6 %       Pertinent Phenotypic data: blasts express CD7, CD13, CD117, CD34, and HLADR.  They are negative for CD19, CD20, CD22, surface CD3, and other analyzed surface T-cell markers.  They are also negative for cytoplasmic MPO, CD3, and TdT.     Disease-specific prognostic estimate: ELN adverse         Acute myeloid leukemia (CMS-HCC)   01/19/2021 Initial Diagnosis    Acute myeloid leukemia (CMS-HCC)     01/31/2021 -  Chemotherapy    OP AML AZACITIDINE + VENETOCLAX  azacitidine 75 mg/m2 SQ on days 1-7, venetoclax ramp up Week 1 (dose dependent), then azacitidine 75 mg/m2 SQ on Days 1-7 every 28 days         Allergies:  No Known Allergies    Medications:  Reviewed in the EMR    Physical Exam:  There were no vitals taken for this visit.    Physical Exam  Vitals reviewed.   Constitutional:       General: He  is awake.      Comments: Tired appearing male, resting in recliner, watching baseball game. Presents with wife.   Cardiovascular:      Rate and Rhythm: Normal rate and regular rhythm.      Heart sounds: Normal heart sounds. No murmur heard.    No friction rub. No gallop.   Pulmonary:      Effort: Pulmonary effort is normal.      Breath sounds: Normal breath sounds. No decreased breath sounds, wheezing, rhonchi or rales.   Abdominal:      General: Bowel sounds are normal.      Palpations: Abdomen is soft.      Tenderness: There is abdominal tenderness.      Comments: Mild tenderness in rlq over prior azacitidine injection sites   Musculoskeletal:      Right lower leg: 2+ Pitting Edema present.      Left lower leg: 2+ Pitting Edema present.   Skin:     General: Skin is warm and dry.      Findings: Ecchymosis present.      Comments: Scattered bruising of BUE. Dime size pink abrasion and lump on right head behind ear.    Neurological:      General: No focal deficit present.      Mental Status: He is alert and oriented to person, place, and time.      Motor: Weakness present.   Psychiatric:         Attention and Perception: Attention normal.         Mood and Affect: Mood normal.         Speech: Speech normal.         Behavior: Behavior is cooperative.           Results:  Hospital Outpatient Visit on 02/09/2021   Component Date Value Ref Range Status   ??? Unit Blood Type 02/09/2021 A Pos   Final   ??? ISBT Number 02/09/2021 [id number]   Final   ??? Unit # 02/09/2021 [id number]   Final   ??? Status 02/09/2021 Returned from Issue   Final   ??? Sodium 02/09/2021 138  135 - 145 mmol/L Final   ??? Potassium 02/09/2021 3.7  3.4 - 4.8 mmol/L Final   ??? Chloride 02/09/2021 104  98 - 107 mmol/L Final   ??? CO2 02/09/2021 25.0  20.0 - 31.0 mmol/L Final   ??? Anion Gap 02/09/2021 9  5 - 14 mmol/L Final   ??? BUN 02/09/2021 19  9 - 23 mg/dL Final   ??? Creatinine 02/09/2021 1.12 (A) 0.60 - 1.10 mg/dL Final   ??? BUN/Creatinine Ratio 02/09/2021 17   Final   ??? eGFR CKD-EPI (2021) Male 02/09/2021 69  >=60 mL/min/1.11m2 Final    eGFR calculated with CKD-EPI 2021 equation in accordance with [employer] and [employer] of Nephrology Task Force recommendations.   ??? Glucose 02/09/2021 95  70 - 179 mg/dL Final   ??? Calcium 11/91/4782 8.5 (A) 8.7 - 10.4 mg/dL Final   ??? Albumin 95/62/1308 2.7 (A) 3.4 - 5.0 g/dL Final   ??? Total Protein 02/09/2021 5.6 (A) 5.7 - 8.2 g/dL Final   ??? Total Bilirubin 02/09/2021 1.5 (A) 0.3 - 1.2 mg/dL Final   ??? AST 65/78/4696 42 (A) <=34 U/L Final   ??? ALT 02/09/2021 34  10 - 49 U/L Final   ??? Alkaline Phosphatase 02/09/2021 176 (A) 46 - 116 U/L Final   ??? Magnesium 02/09/2021 1.6  1.6 -  2.6 mg/dL Final   ??? Unit Blood Type 02/09/2021 A Pos   Final   ??? ISBT Number 02/09/2021 [id number]   Final   ??? Unit # 02/09/2021 [id number]   Final   ??? Status 02/09/2021 Issued   Final   ??? Product ID 02/09/2021 Platelets   Final   ??? PRODUCT CODE 02/09/2021 [id number]   Final   Lab on 02/09/2021   Component Date Value Ref Range Status   ??? WBC 02/09/2021 0.3 (A) 3.6 - 11.2 10*9/L Final   ??? RBC 02/09/2021 3.01 (A) 4.26 - 5.60 10*12/L Final   ??? HGB 02/09/2021 9.6 (A) 12.9 - 16.5 g/dL Final   ??? HCT 95/62/1308 27.0 (A) 39.0 - 48.0 % Final   ??? MCV 02/09/2021 89.6  77.6 - 95.7 fL Final   ??? MCH 02/09/2021 31.8  25.9 - 32.4 pg Final   ??? MCHC 02/09/2021 35.4  32.0 - 36.0 g/dL Final   ??? RDW 65/78/4696 16.2 (A) 12.2 - 15.2 % Final   ??? MPV 02/09/2021 8.1  6.8 - 10.7 fL Final   ??? Platelet 02/09/2021 10 (A) 150 - 450 10*9/L Final   ??? Neutrophils % 02/09/2021 52.4  % Final   ??? Lymphocytes % 02/09/2021 43.2  % Final   ??? Monocytes % 02/09/2021 4.1  % Final   ??? Eosinophils % 02/09/2021 0.3  % Final   ??? Basophils % 02/09/2021 0.0  % Final   ??? Absolute Neutrophils 02/09/2021 0.2 (A) 1.8 - 7.8 10*9/L Final   ??? Absolute Lymphocytes 02/09/2021 0.1 (A) 1.1 - 3.6 10*9/L Final   ??? Absolute Monocytes 02/09/2021 0.0 (A) 0.3 - 0.8 10*9/L Final   ??? Absolute Eosinophils 02/09/2021 0.0  0.0 - 0.5 10*9/L Final   ??? Absolute Basophils 02/09/2021 0.0  0.0 - 0.1 10*9/L Final   ??? Anisocytosis 02/09/2021 Slight (A) Not Present Final       I spent a total of 35 minutes in the care of this patient.     Lavonda Jumbo, AGNP-BC  [facility]

## 2021-02-10 NOTE — Unmapped (Signed)
Patient arrived to unit accompanied by spouse for platelets.  Platelets released, marked as issued in system, did not arrive to unit.  Blood bank notified and instructed this nurse to place another send for order for the patient's initial bag.  Patient tolerated blood product without incident.  Per Zella Ball, APP, patient is to only have one bag of platelets, to depart after having platelet count drawn.

## 2021-02-11 ENCOUNTER — Emergency Department: Payer: Medicare Other

## 2021-02-11 ENCOUNTER — Inpatient Hospital Stay: Payer: Medicare Other

## 2021-02-11 ENCOUNTER — Inpatient Hospital Stay: Payer: Medicare Other | Admitting: Anesthesiology

## 2021-02-11 ENCOUNTER — Encounter: Payer: Self-pay | Admitting: Radiology

## 2021-02-11 ENCOUNTER — Other Ambulatory Visit: Payer: Self-pay | Admitting: Urology

## 2021-02-11 ENCOUNTER — Encounter
Admission: EM | Disposition: A | Discharge status: Other Acute Inpatient Hospital | Payer: Self-pay | Source: Home / Self Care | Attending: Pulmonary Disease

## 2021-02-11 ENCOUNTER — Inpatient Hospital Stay
Admission: EM | Admit: 2021-02-11 | Discharge: 2021-02-12 | DRG: 659 | Disposition: A | Discharge status: Other Acute Inpatient Hospital | Payer: Medicare Other | Attending: Internal Medicine | Admitting: Internal Medicine

## 2021-02-11 ENCOUNTER — Other Ambulatory Visit: Payer: Self-pay

## 2021-02-11 DIAGNOSIS — E8809 Other disorders of plasma-protein metabolism, not elsewhere classified: Secondary | ICD-10-CM | POA: Diagnosis present

## 2021-02-11 DIAGNOSIS — I482 Chronic atrial fibrillation, unspecified: Secondary | ICD-10-CM | POA: Diagnosis present

## 2021-02-11 DIAGNOSIS — K5010 Crohn's disease of large intestine without complications: Secondary | ICD-10-CM | POA: Diagnosis present

## 2021-02-11 DIAGNOSIS — N179 Acute kidney failure, unspecified: Secondary | ICD-10-CM | POA: Diagnosis present

## 2021-02-11 DIAGNOSIS — Z8673 Personal history of transient ischemic attack (TIA), and cerebral infarction without residual deficits: Secondary | ICD-10-CM | POA: Diagnosis not present

## 2021-02-11 DIAGNOSIS — J9811 Atelectasis: Secondary | ICD-10-CM | POA: Diagnosis present

## 2021-02-11 DIAGNOSIS — W19XXXA Unspecified fall, initial encounter: Secondary | ICD-10-CM

## 2021-02-11 DIAGNOSIS — N201 Calculus of ureter: Secondary | ICD-10-CM | POA: Diagnosis present

## 2021-02-11 DIAGNOSIS — D61818 Other pancytopenia: Secondary | ICD-10-CM | POA: Diagnosis present

## 2021-02-11 DIAGNOSIS — Z6830 Body mass index (BMI) 30.0-30.9, adult: Secondary | ICD-10-CM | POA: Diagnosis not present

## 2021-02-11 DIAGNOSIS — Z7901 Long term (current) use of anticoagulants: Secondary | ICD-10-CM

## 2021-02-11 DIAGNOSIS — D709 Neutropenia, unspecified: Secondary | ICD-10-CM | POA: Diagnosis not present

## 2021-02-11 DIAGNOSIS — N132 Hydronephrosis with renal and ureteral calculous obstruction: Secondary | ICD-10-CM | POA: Diagnosis present

## 2021-02-11 DIAGNOSIS — N400 Enlarged prostate without lower urinary tract symptoms: Secondary | ICD-10-CM | POA: Diagnosis present

## 2021-02-11 DIAGNOSIS — Z20822 Contact with and (suspected) exposure to covid-19: Secondary | ICD-10-CM | POA: Diagnosis present

## 2021-02-11 DIAGNOSIS — I5022 Chronic systolic (congestive) heart failure: Secondary | ICD-10-CM | POA: Diagnosis present

## 2021-02-11 DIAGNOSIS — L8990 Pressure ulcer of unspecified site, unspecified stage: Secondary | ICD-10-CM | POA: Insufficient documentation

## 2021-02-11 DIAGNOSIS — E669 Obesity, unspecified: Secondary | ICD-10-CM | POA: Diagnosis present

## 2021-02-11 DIAGNOSIS — Y831 Surgical operation with implant of artificial internal device as the cause of abnormal reaction of the patient, or of later complication, without mention of misadventure at the time of the procedure: Secondary | ICD-10-CM | POA: Diagnosis present

## 2021-02-11 DIAGNOSIS — Y929 Unspecified place or not applicable: Secondary | ICD-10-CM | POA: Diagnosis not present

## 2021-02-11 DIAGNOSIS — W06XXXA Fall from bed, initial encounter: Secondary | ICD-10-CM | POA: Diagnosis present

## 2021-02-11 DIAGNOSIS — Z515 Encounter for palliative care: Secondary | ICD-10-CM | POA: Diagnosis not present

## 2021-02-11 DIAGNOSIS — J90 Pleural effusion, not elsewhere classified: Secondary | ICD-10-CM

## 2021-02-11 DIAGNOSIS — E43 Unspecified severe protein-calorie malnutrition: Secondary | ICD-10-CM | POA: Diagnosis present

## 2021-02-11 DIAGNOSIS — A419 Sepsis, unspecified organism: Secondary | ICD-10-CM | POA: Diagnosis present

## 2021-02-11 DIAGNOSIS — N1330 Unspecified hydronephrosis: Secondary | ICD-10-CM

## 2021-02-11 DIAGNOSIS — Z86718 Personal history of other venous thrombosis and embolism: Secondary | ICD-10-CM | POA: Diagnosis not present

## 2021-02-11 DIAGNOSIS — R5081 Fever presenting with conditions classified elsewhere: Secondary | ICD-10-CM | POA: Diagnosis not present

## 2021-02-11 DIAGNOSIS — C9200 Acute myeloblastic leukemia, not having achieved remission: Secondary | ICD-10-CM | POA: Diagnosis present

## 2021-02-11 DIAGNOSIS — Z79890 Hormone replacement therapy (postmenopausal): Secondary | ICD-10-CM | POA: Diagnosis not present

## 2021-02-11 DIAGNOSIS — R6521 Severe sepsis with septic shock: Secondary | ICD-10-CM | POA: Diagnosis present

## 2021-02-11 DIAGNOSIS — G4730 Sleep apnea, unspecified: Secondary | ICD-10-CM | POA: Diagnosis present

## 2021-02-11 DIAGNOSIS — D696 Thrombocytopenia, unspecified: Secondary | ICD-10-CM

## 2021-02-11 DIAGNOSIS — T83592A Infection and inflammatory reaction due to indwelling ureteral stent, initial encounter: Secondary | ICD-10-CM | POA: Diagnosis present

## 2021-02-11 DIAGNOSIS — Z79899 Other long term (current) drug therapy: Secondary | ICD-10-CM | POA: Diagnosis not present

## 2021-02-11 DIAGNOSIS — Z96649 Presence of unspecified artificial hip joint: Secondary | ICD-10-CM | POA: Diagnosis present

## 2021-02-11 DIAGNOSIS — Z86711 Personal history of pulmonary embolism: Secondary | ICD-10-CM

## 2021-02-11 DIAGNOSIS — N134 Hydroureter: Secondary | ICD-10-CM | POA: Diagnosis not present

## 2021-02-11 HISTORY — PX: CYSTOSCOPY W/ URETERAL STENT PLACEMENT: SHX1429

## 2021-02-11 LAB — URINALYSIS, COMPLETE (UACMP) WITH MICROSCOPIC
RBC / HPF: >50 RBC/hpf — ABNORMAL HIGH (ref 0–5)
Specific Gravity, Urine: 1.023 (ref 1.005–1.030)
Squamous Epithelial / HPF: NONE SEEN (ref 0–5)

## 2021-02-11 LAB — LACTIC ACID, PLASMA
Lactic Acid, Venous: 3.2 mmol/L (ref 0.5–1.9)
Lactic Acid, Venous: 2.6 mmol/L (ref 0.5–1.9)
Lactic Acid, Venous: 2.5 mmol/L (ref 0.5–1.9)

## 2021-02-11 LAB — CBC WITH DIFFERENTIAL/PLATELET
Abs Immature Granulocytes: 0 10*3/uL (ref 0.00–0.07)
Basophils Absolute: 0 10*3/uL (ref 0.0–0.1)
Basophils Relative: 2 %
Eosinophils Absolute: 0 10*3/uL (ref 0.0–0.5)
Eosinophils Relative: 0 %
HCT: 22.4 % — ABNORMAL LOW (ref 39.0–52.0)
Hemoglobin: 8 g/dL — ABNORMAL LOW (ref 13.0–17.0)
Immature Granulocytes: 0 %
Lymphocytes Relative: 8 %
Lymphs Abs: 0.1 10*3/uL — ABNORMAL LOW (ref 0.7–4.0)
MCH: 32.7 pg (ref 26.0–34.0)
MCHC: 35.7 g/dL (ref 30.0–36.0)
MCV: 91.4 fL (ref 80.0–100.0)
Monocytes Absolute: 0 10*3/uL — ABNORMAL LOW (ref 0.1–1.0)
Monocytes Relative: 6 %
Neutro Abs: 0.5 10*3/uL — ABNORMAL LOW (ref 1.7–7.7)
Neutrophils Relative %: 84 %
Platelets: 8 10*3/uL — CL (ref 150–400)
RBC: 2.45 MIL/uL — ABNORMAL LOW (ref 4.22–5.81)
RDW: 16.6 % — ABNORMAL HIGH (ref 11.5–15.5)
WBC: 0.6 10*3/uL — CL (ref 4.0–10.5)
nRBC: 0 % (ref 0.0–0.2)

## 2021-02-11 LAB — COMPREHENSIVE METABOLIC PANEL
ALT: 25 U/L (ref 0–44)
AST: 36 U/L (ref 15–41)
Albumin: 2.5 g/dL — ABNORMAL LOW (ref 3.5–5.0)
Alkaline Phosphatase: 133 U/L — ABNORMAL HIGH (ref 38–126)
Anion gap: 9 (ref 5–15)
BUN: 29 mg/dL — ABNORMAL HIGH (ref 8–23)
CO2: 24 mmol/L (ref 22–32)
Calcium: 8.3 mg/dL — ABNORMAL LOW (ref 8.9–10.3)
Chloride: 103 mmol/L (ref 98–111)
Creatinine, Ser: 1.36 mg/dL — ABNORMAL HIGH (ref 0.61–1.24)
GFR, Estimated: 54 mL/min — ABNORMAL LOW (ref >60)
Glucose, Bld: 123 mg/dL — ABNORMAL HIGH (ref 70–99)
Potassium: 3.5 mmol/L (ref 3.5–5.1)
Sodium: 136 mmol/L (ref 135–145)
Total Bilirubin: 2.8 mg/dL — ABNORMAL HIGH (ref 0.3–1.2)
Total Protein: 5.3 g/dL — ABNORMAL LOW (ref 6.5–8.1)

## 2021-02-11 LAB — POTASSIUM: Potassium: 3.3 mmol/L — ABNORMAL LOW (ref 3.5–5.1)

## 2021-02-11 LAB — RESP PANEL BY RT-PCR (FLU A&B, COVID) ARPGX2
Influenza A by PCR: NEGATIVE
Influenza B by PCR: NEGATIVE
SARS Coronavirus 2 by RT PCR: NEGATIVE

## 2021-02-11 LAB — PLATELET COUNT
Platelets: 22 10*3/uL — CL (ref 150–400)
Platelets: 27 10*3/uL — CL (ref 150–400)

## 2021-02-11 LAB — MAGNESIUM
Magnesium: 1.4 mg/dL — ABNORMAL LOW (ref 1.7–2.4)
Magnesium: 1.3 mg/dL — ABNORMAL LOW (ref 1.7–2.4)

## 2021-02-11 LAB — LIPASE, BLOOD: Lipase: 24 U/L (ref 11–51)

## 2021-02-11 LAB — PROTIME-INR
INR: 1.2 (ref 0.8–1.2)
Prothrombin Time: 15.5 seconds — ABNORMAL HIGH (ref 11.4–15.2)

## 2021-02-11 LAB — STREP PNEUMONIAE URINARY ANTIGEN: Strep Pneumo Urinary Antigen: NEGATIVE

## 2021-02-11 LAB — MRSA NEXT GEN BY PCR, NASAL: MRSA by PCR Next Gen: NOT DETECTED

## 2021-02-11 LAB — PHOSPHORUS
Phosphorus: 1.7 mg/dL — ABNORMAL LOW (ref 2.5–4.6)
Phosphorus: 3.5 mg/dL (ref 2.5–4.6)

## 2021-02-11 LAB — CORTISOL: Cortisol, Plasma: 24.3 ug/dL

## 2021-02-11 LAB — APTT: aPTT: 39 seconds — ABNORMAL HIGH (ref 24–36)

## 2021-02-11 LAB — PROCALCITONIN: Procalcitonin: 1.05 ng/mL

## 2021-02-11 LAB — GLUCOSE, CAPILLARY: Glucose-Capillary: 97 mg/dL (ref 70–99)

## 2021-02-11 LAB — BRAIN NATRIURETIC PEPTIDE: B Natriuretic Peptide: 386.2 pg/mL — ABNORMAL HIGH (ref 0.0–100.0)

## 2021-02-11 IMAGING — CR DG SHOULDER 2+V*R*
1 series · 3 of 3 positions shown · non-contrast
Comparison: No priors.

CLINICAL DATA: 74-year-old male with history of trauma from a fall
out of bed this morning. Right shoulder pain.

EXAM:
RIGHT SHOULDER - 2+ VIEW

[Series 1: dg shoulder right · 0.14mm/px · 3 of 3 slices shown]
[im 1/3]
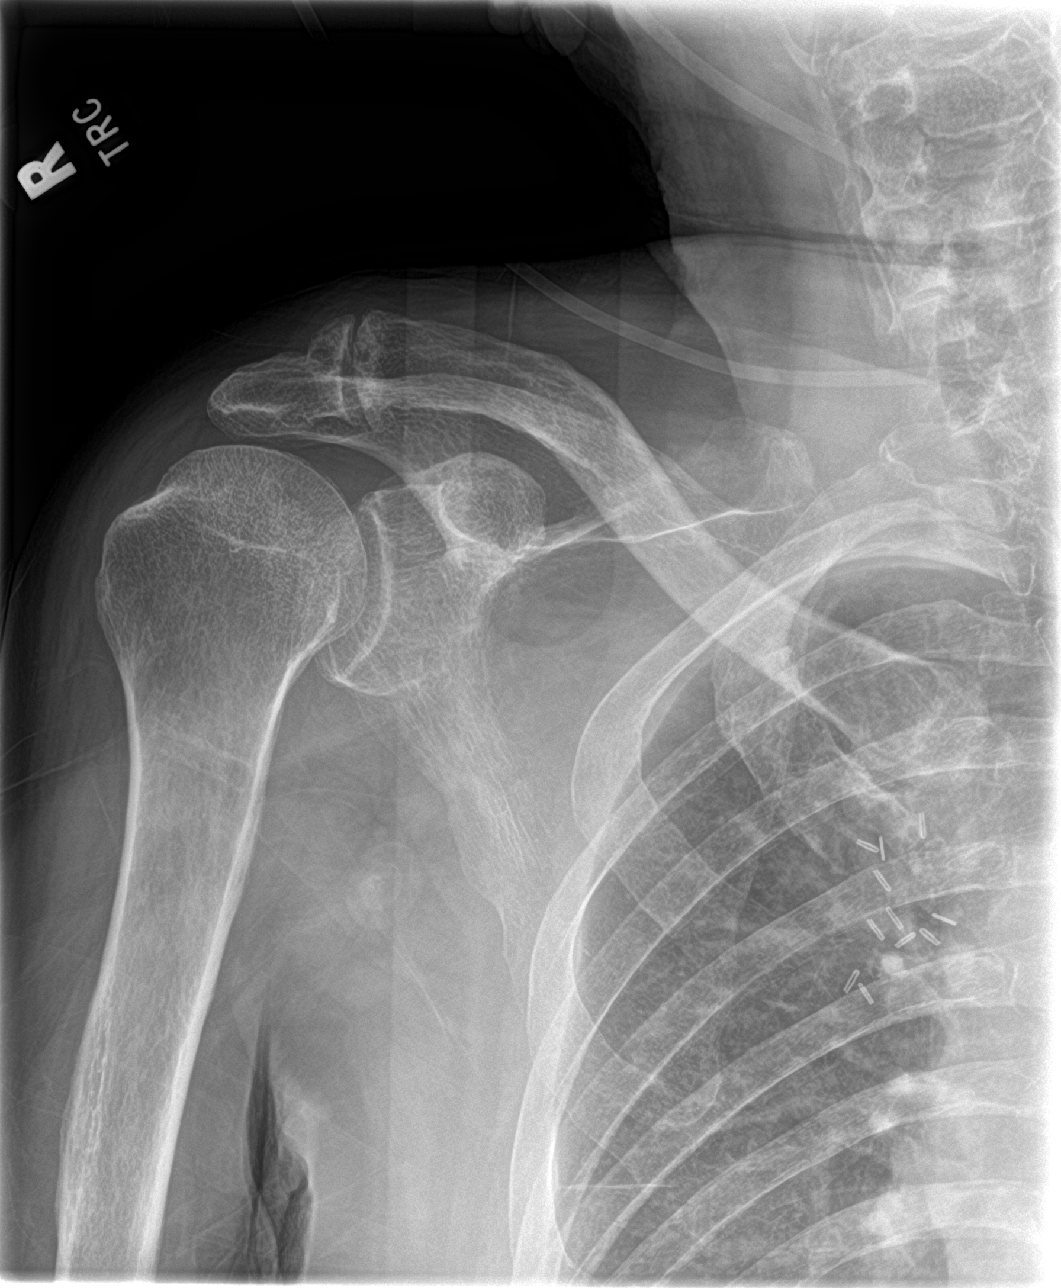
[im 2/3]
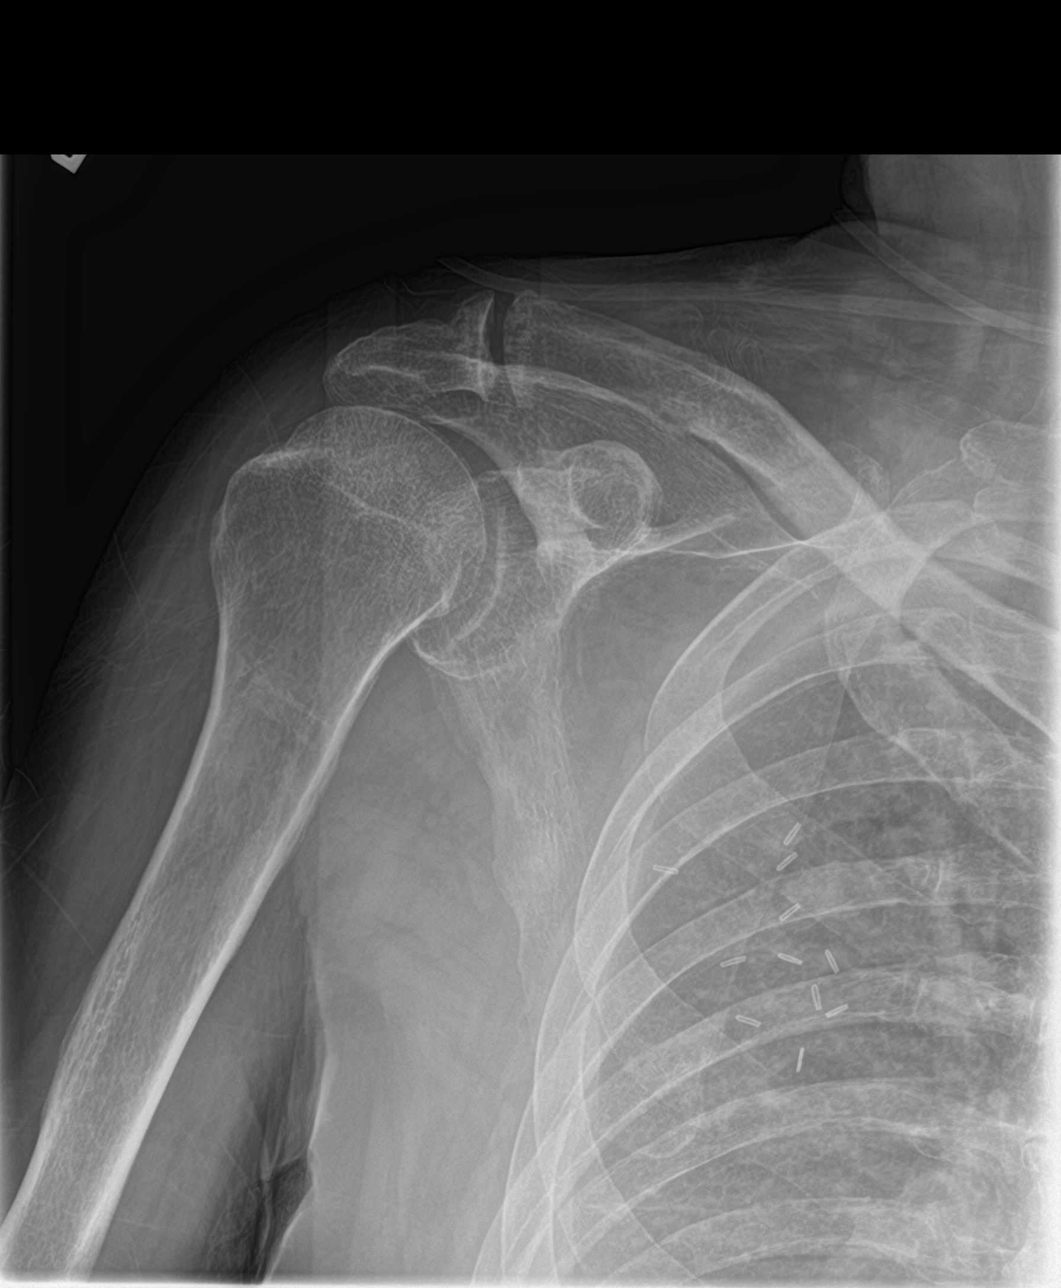
[im 3/3]
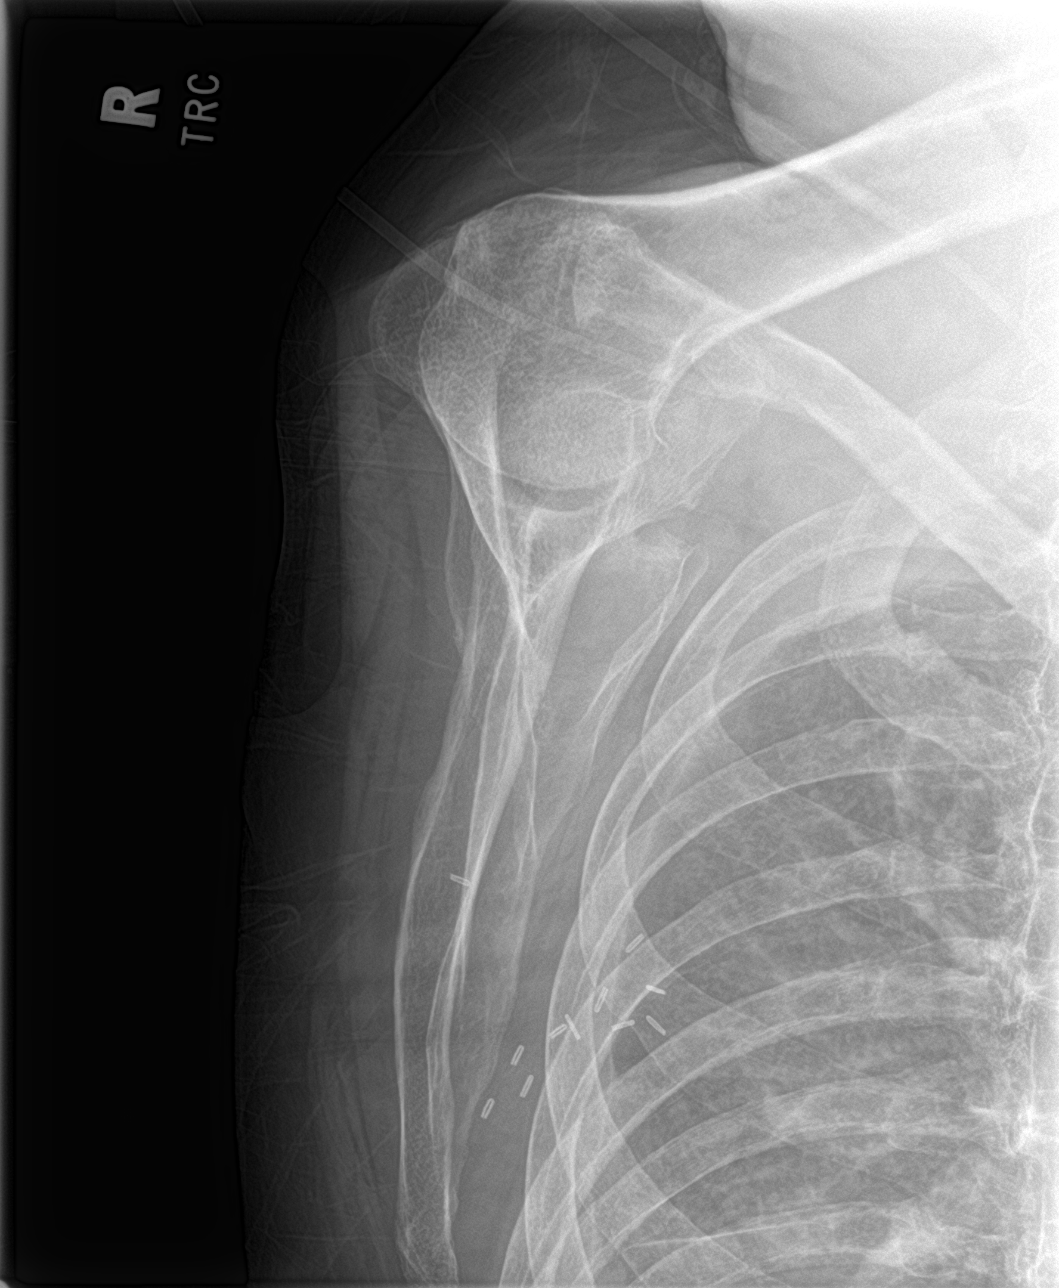

[3 of 3 positions shown; findings below may reference images not displayed]

FINDINGS: Three views of the right shoulder demonstrate no acute displaced
fracture or dislocation. Humeral head is high-riding, suggesting
rotator cuff pathology. There is joint space narrowing, subchondral
sclerosis, subchondral cyst formation and osteophyte formation in
the glenohumeral and acromioclavicular joints, compatible with
osteoarthritis. Numerous surgical clips project over the right
hemithorax.
IMPRESSION: 1. No acute radiographic abnormality of the right shoulder.
2. Acromioclavicular and glenohumeral joint osteoarthritis with
high-riding humeral head suggesting chronic rotator cuff disease.

## 2021-02-11 IMAGING — CT CT CERVICAL SPINE W/O CM
3 of 4 series · 11 of 34 positions shown, 13 images · non-contrast
Comparison: Recent CT cervical spine [DATE]. CT head today.
Chest CT [DATE].

CLINICAL DATA: 74-year-old male fell out of bed. Right side pain.
Leukemia, on chemotherapy.

EXAM:
CT CERVICAL SPINE WITHOUT CONTRAST
TECHNIQUE: Multidetector CT imaging of the cervical spine was performed without
intravenous contrast. Multiplanar CT image reconstructions were also
generated.

[Series 6: orthogonal bone · axial · 0.32mm/px · z∈[-283,-162]mm · 3 of 121 slices shown, 4 images]
[im 35/121  soft-tissue]
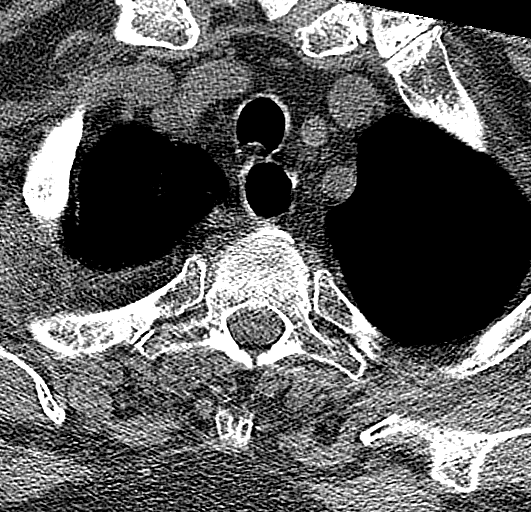
[im 35/121  bone]
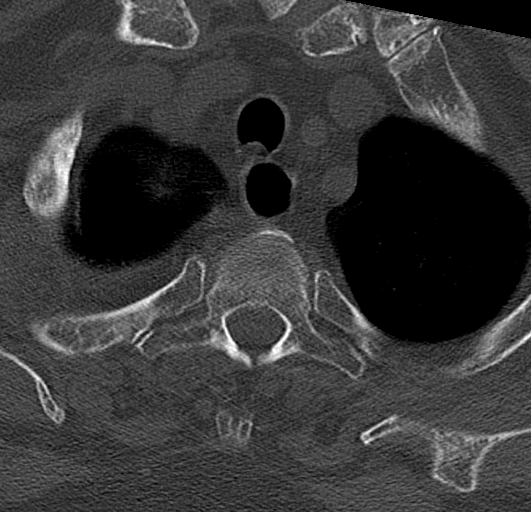
[im 69/121  bone]
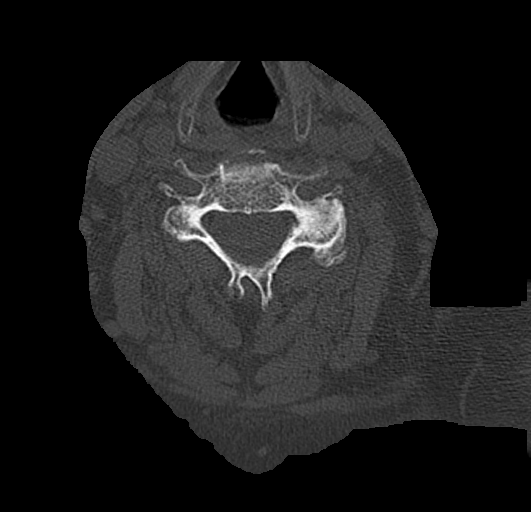
[im 103/121  bone]
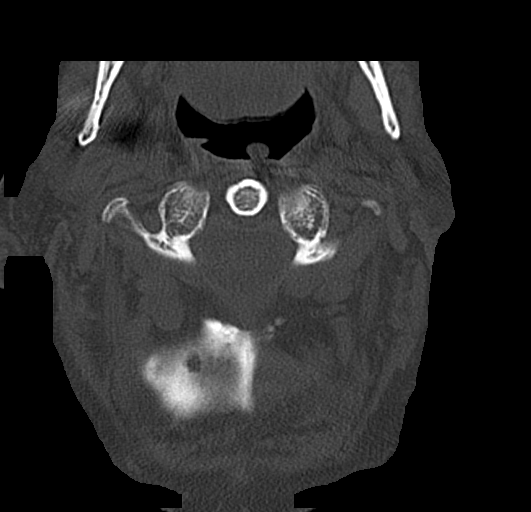

[Series 7: sagittal bone · sagittal · 0.32mm/px · 5 of 65 slices shown, 6 images]
[im 22/65  bone]
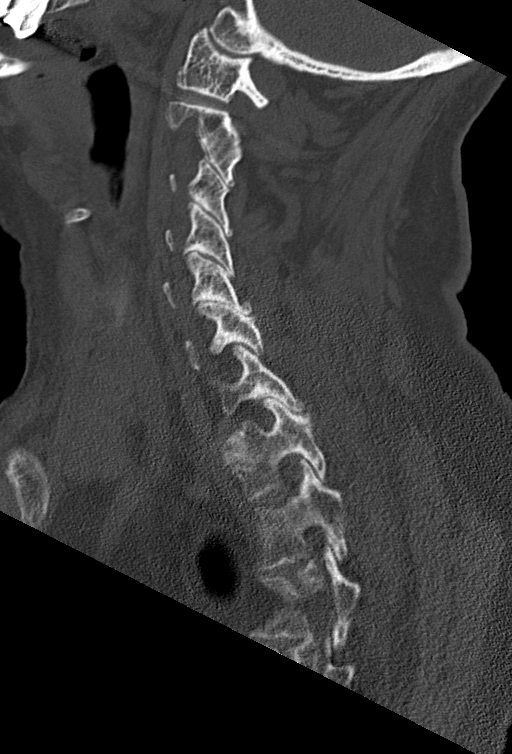
[im 27/65  bone]
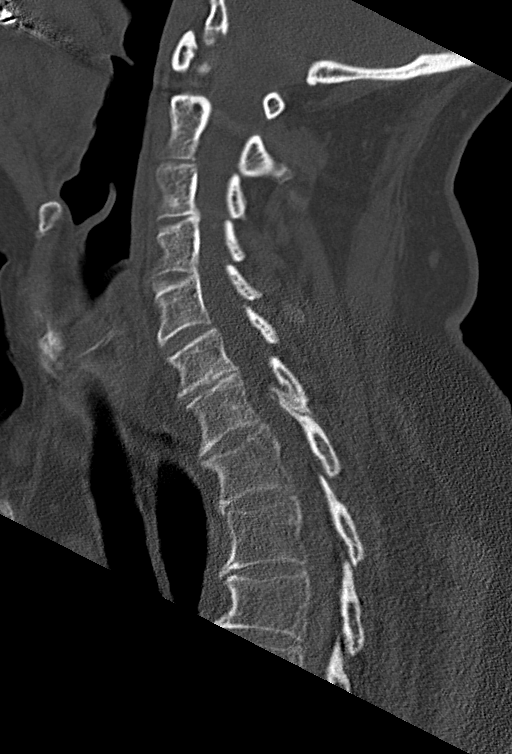
[im 33/65  soft-tissue]
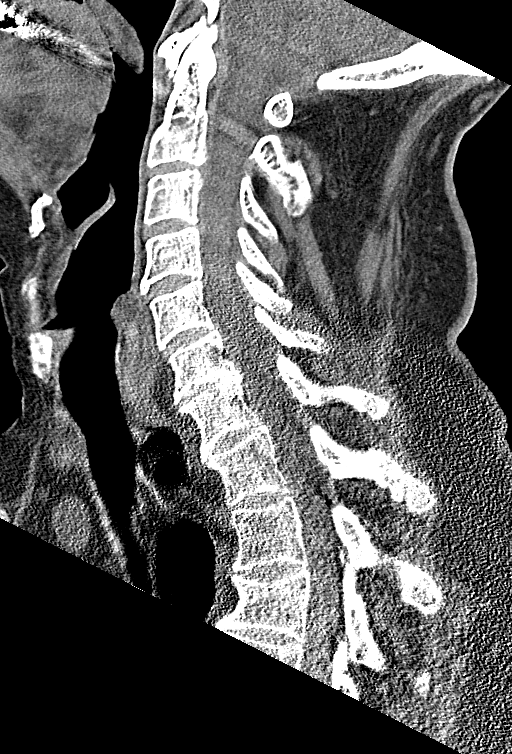
[im 33/65  bone]
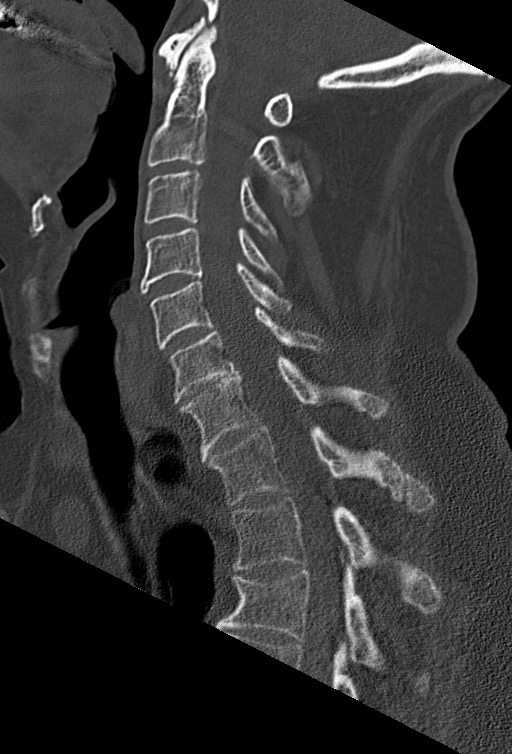
[im 38/65  bone]
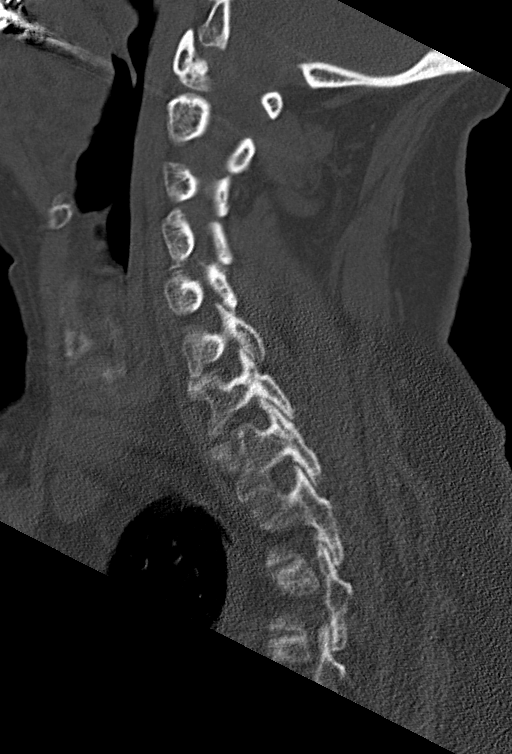
[im 43/65  bone]
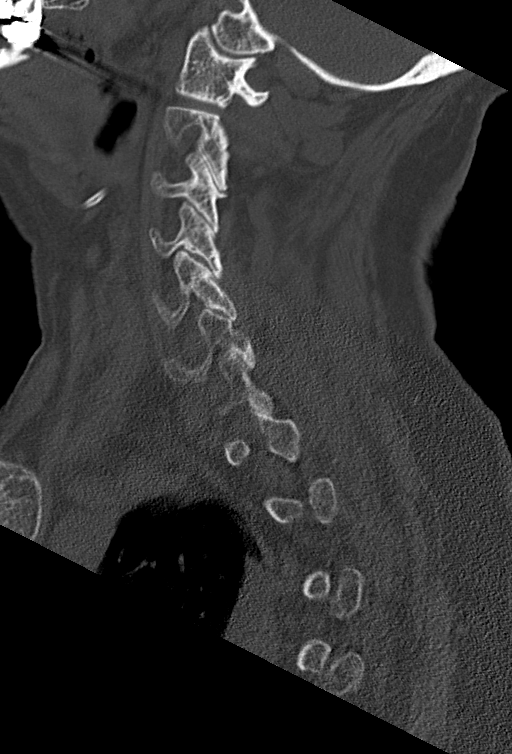

[Series 8: coronal bone · coronal · 0.25mm/px · 3 of 78 slices shown]
[im 24/78  bone]
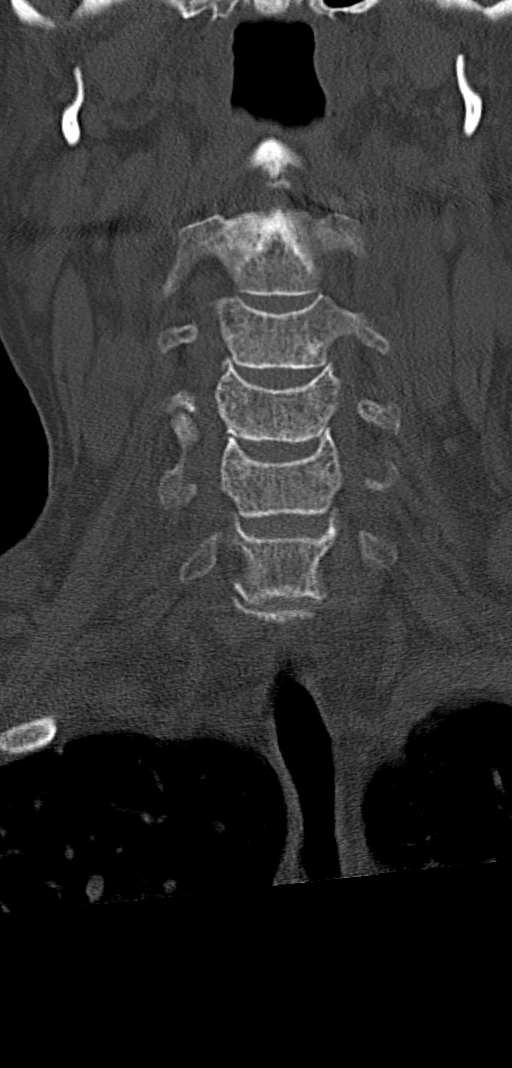
[im 34/78  bone]
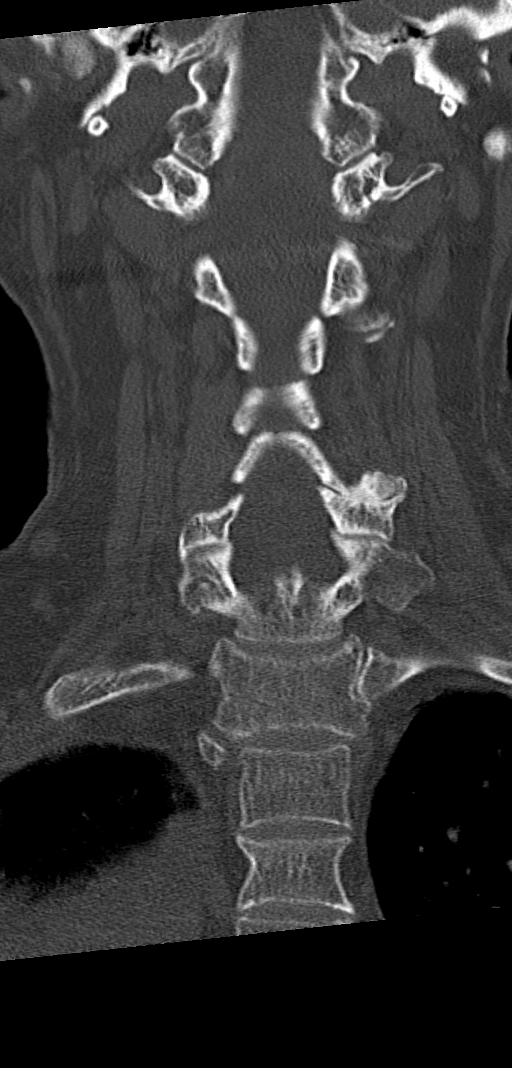
[im 44/78  bone]
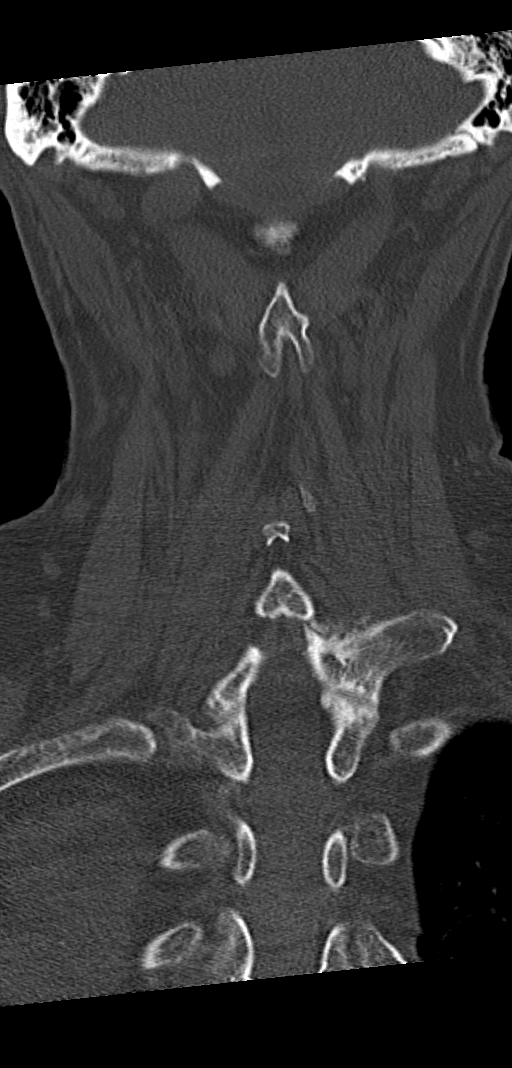

[11 of 34 positions shown; findings below may reference images not displayed]

FINDINGS: Alignment: Stable cervical lordosis. Cervicothoracic junction
alignment is within normal limits. Bilateral posterior element
alignment is within normal limits.

Skull base and vertebrae: Osteopenia. Visualized skull base is
intact. No atlanto-occipital dissociation. C1 and C2 appear intact
and aligned. No acute osseous abnormality identified.

Soft tissues and spinal canal: No prevertebral fluid or swelling. No
visible canal hematoma. Negative noncontrast visible neck soft
tissues.

Disc levels: Cervical spine degeneration is mild for age and stable.

Upper chest: Mild chronic compression fractures of T1 and T3 are
stable since [REDACTED]. Layering right pleural effusion in the apex seems
to have intermediate fluid density. Otherwise negative lung apices,
visible superior mediastinum.
IMPRESSION: 1. No acute traumatic injury identified in the cervical spine.
2. Persistent layering pleural effusion in the right lung apex,
possibly exudate of.
3. Mild chronic T1 and T3 compression fractures.

## 2021-02-11 IMAGING — DX DG ABD PORTABLE 1V
2 series · 2 of 2 positions shown · non-contrast
Comparison: CT abdomen pelvis [DATE]

CLINICAL DATA: Follow-up post CT contrast to check for right stent
placement.

EXAM:
PORTABLE ABDOMEN - 1 VIEW

[abdomen supine (1 of 2)]
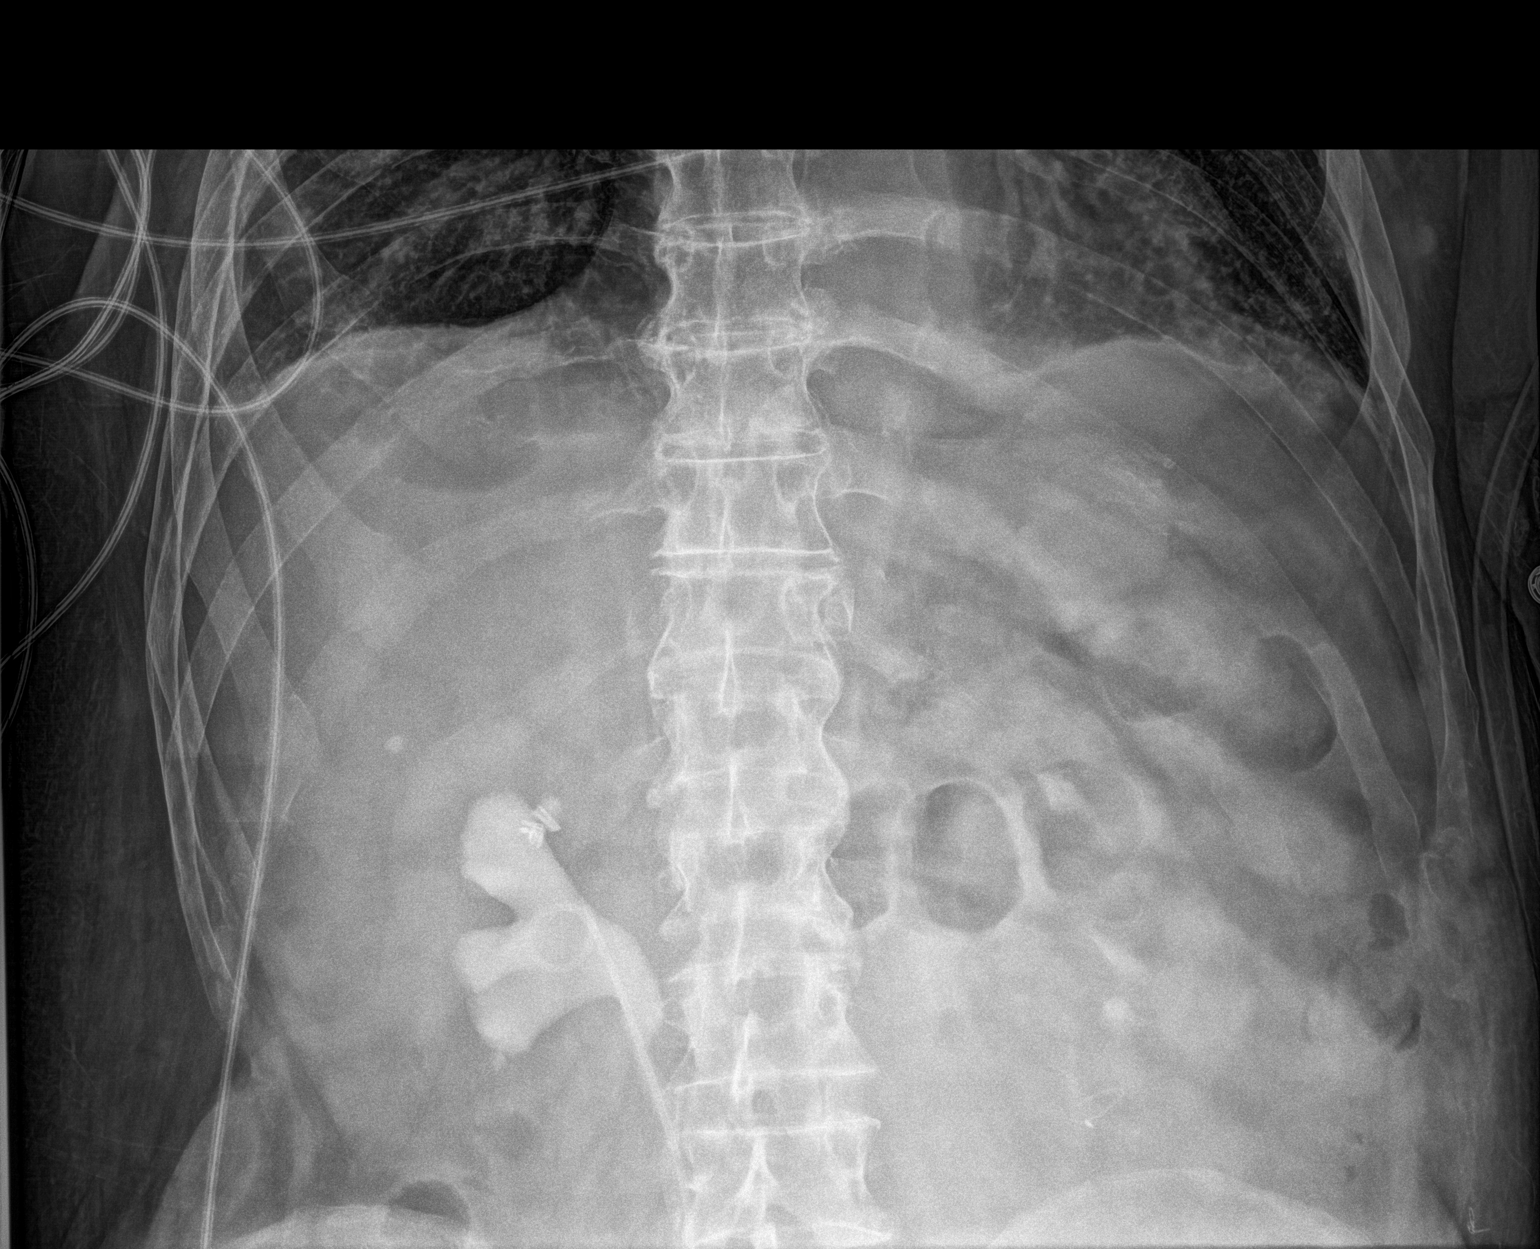

[abdomen supine (2 of 2)]
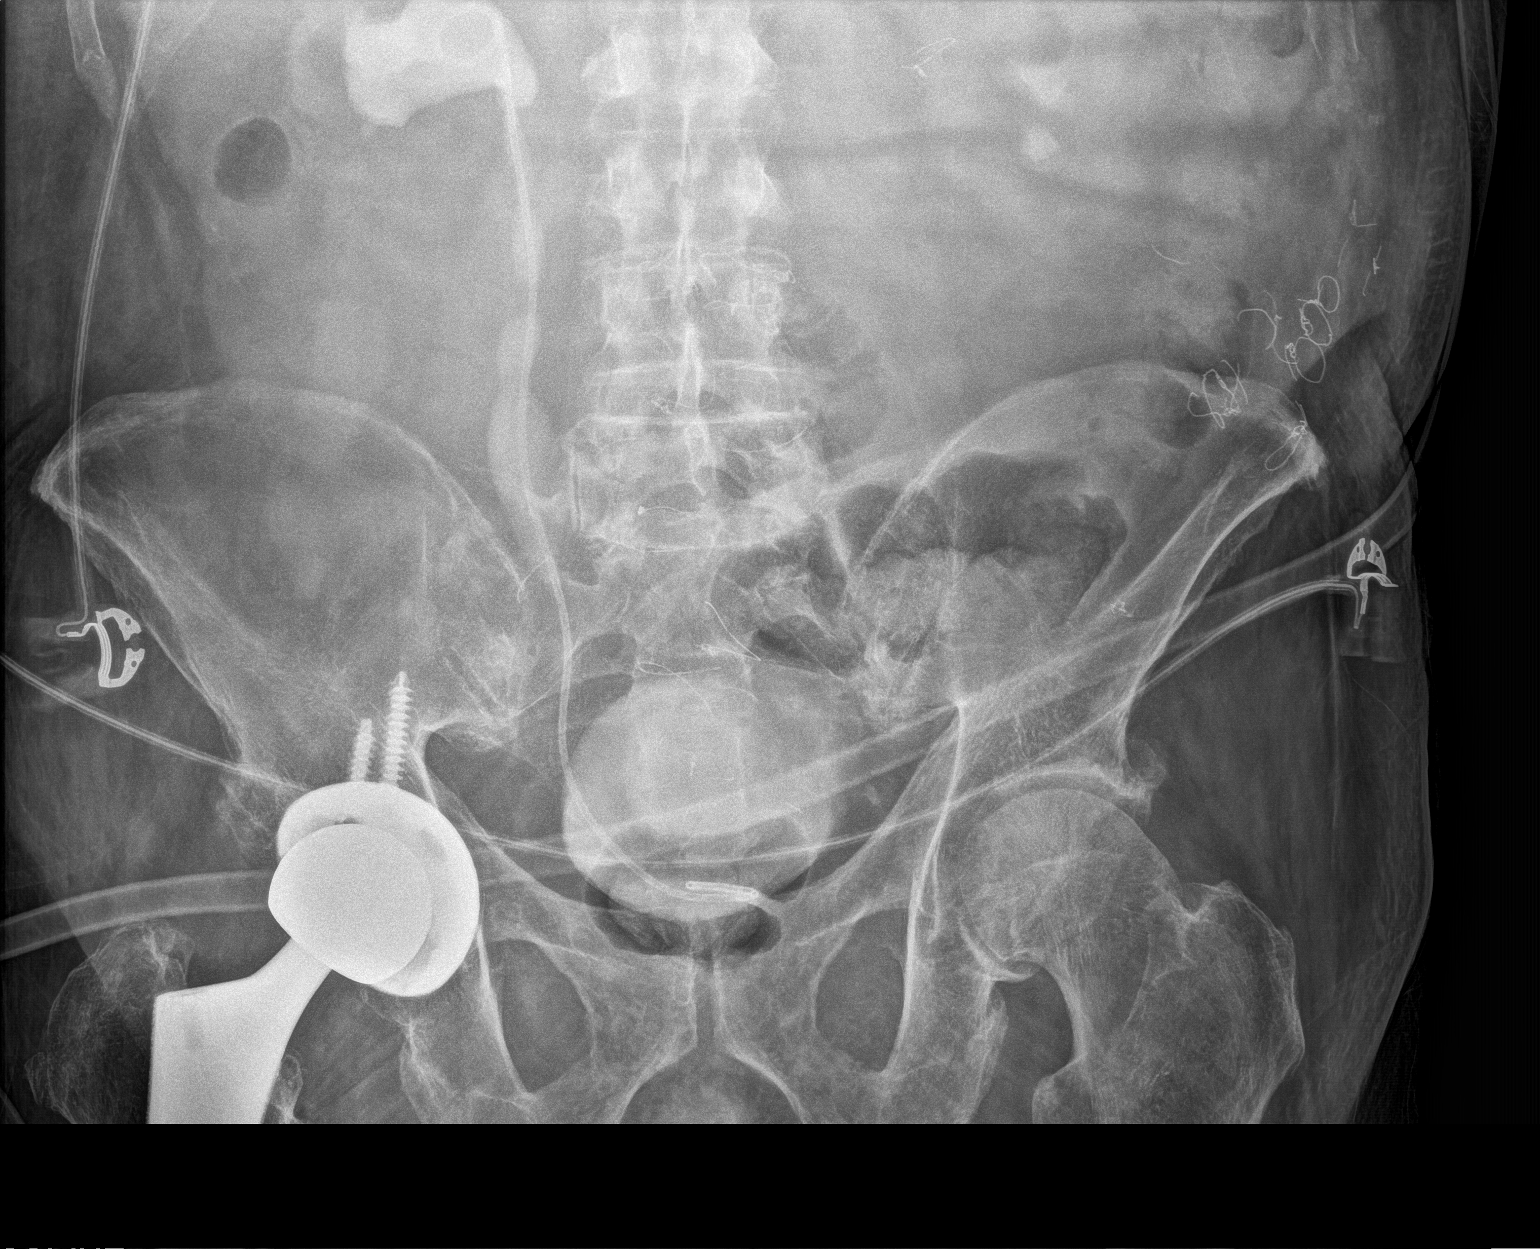

[2 of 2 positions shown; findings below may reference images not displayed]

FINDINGS: Right double pigtail ureteral catheter is identified in good
position. Moderate right hydronephrosis is noted.
IMPRESSION: Right double pigtail ureteral catheter is identified in good
position.

## 2021-02-11 IMAGING — CT CT ABD-PELV W/ CM
2 of 5 series · 15 of 46 positions shown, 17 images · IV contrast (APPLIED)
Comparison: CT the abdomen and pelvis [DATE].

CLINICAL DATA: 74-year-old male with history of trauma after
falling out of bed this morning.

EXAM:
CT ABDOMEN AND PELVIS WITH CONTRAST
TECHNIQUE: Multidetector CT imaging of the abdomen and pelvis was performed
using the standard protocol following bolus administration of
intravenous contrast.
CONTRAST:  75mL OMNIPAQUE IOHEXOL 350 MG/ML SOLN

[Series 2: routine abd/pel with (person_name) · axial · 0.96mm/px · z∈[-895,-430]mm · 12 of 105 slices shown, 14 images]
[im 6/105  soft-tissue]
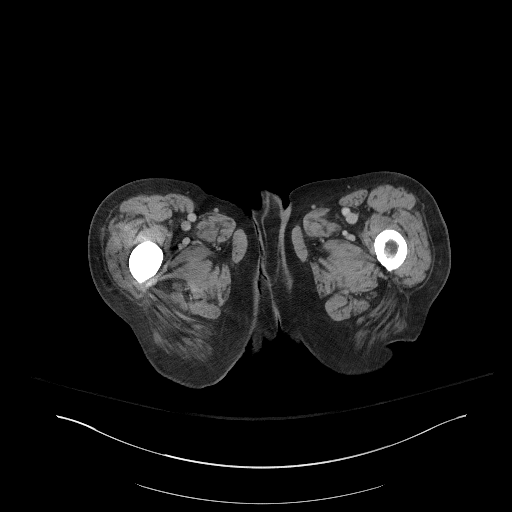
[im 6/105  bone]
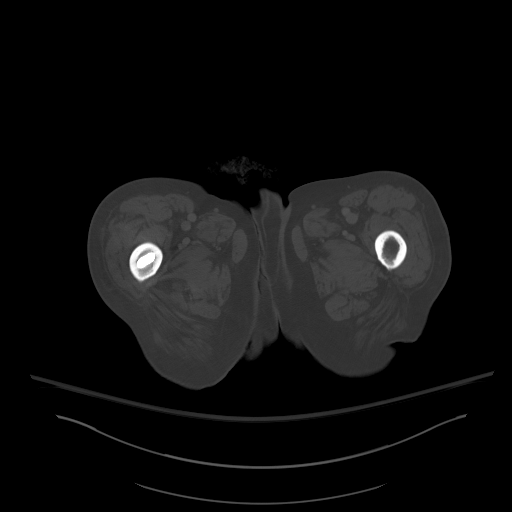
[im 17/105  soft-tissue]
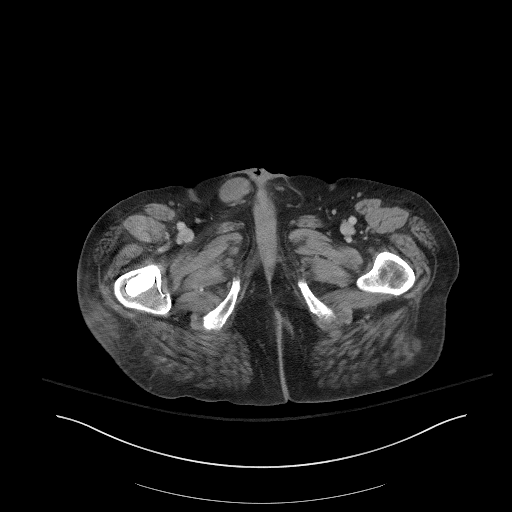
[im 22/105  soft-tissue]
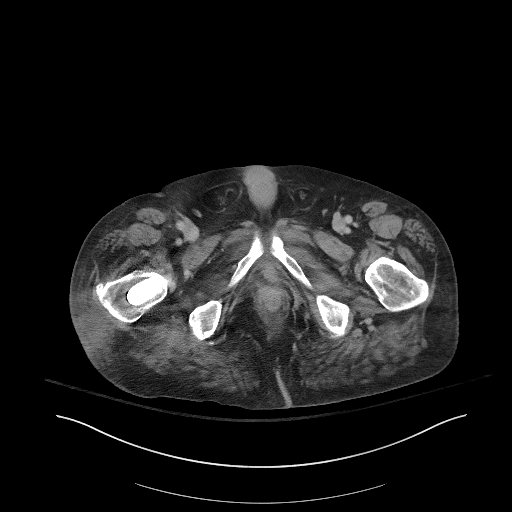
[im 33/105  soft-tissue]
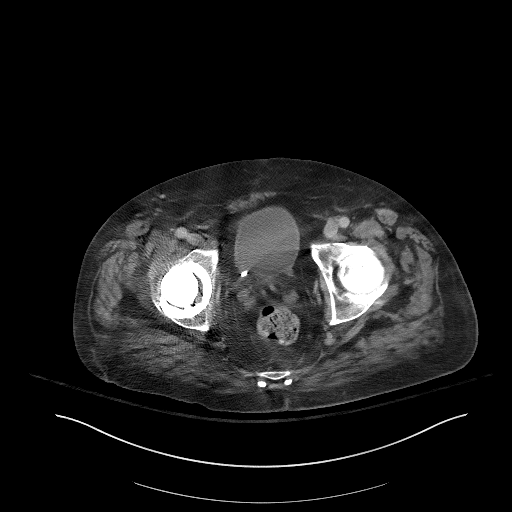
[im 39/105  soft-tissue]
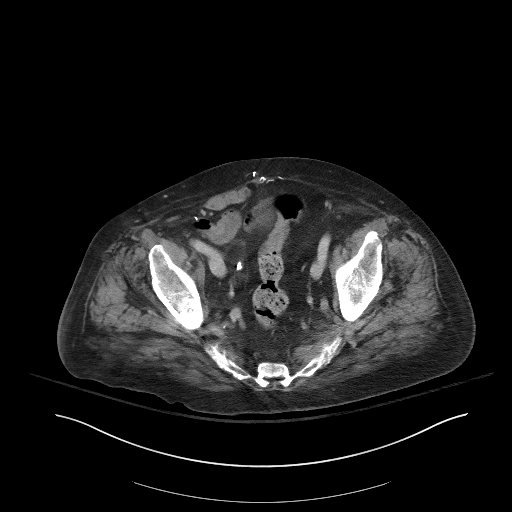
[im 50/105  soft-tissue]
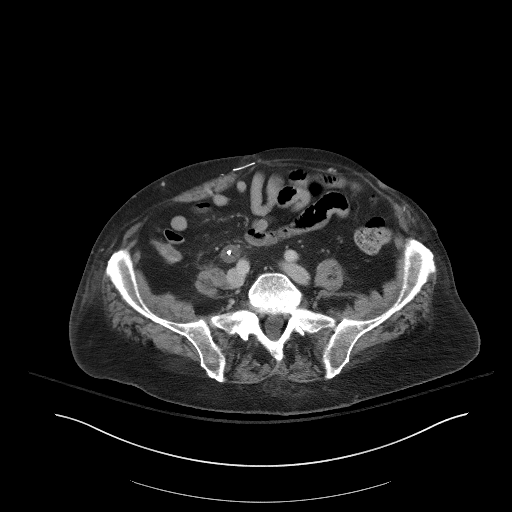
[im 55/105  soft-tissue]
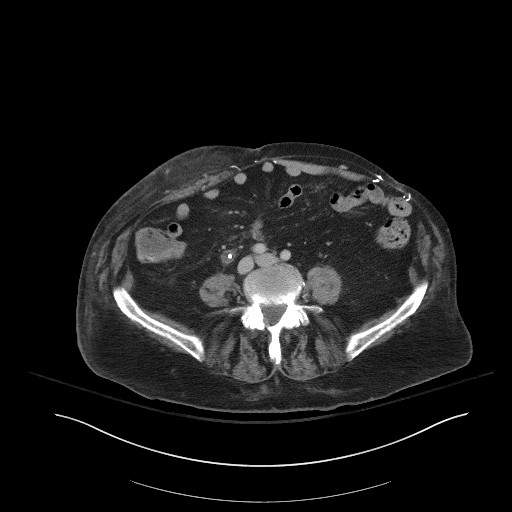
[im 66/105  soft-tissue]
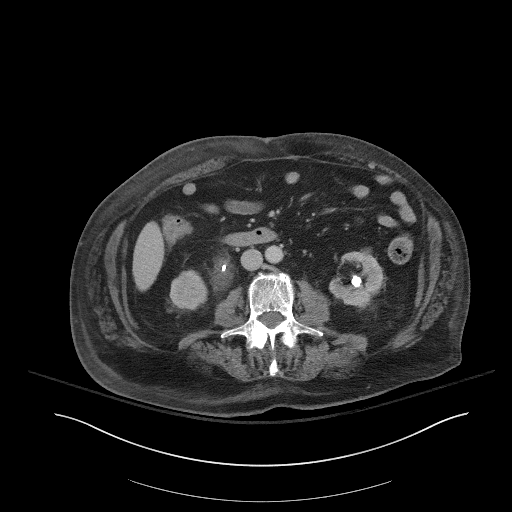
[im 72/105  soft-tissue]
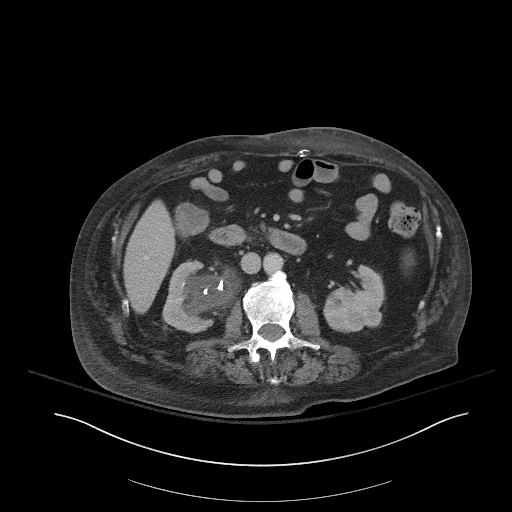
[im 72/105  bone]
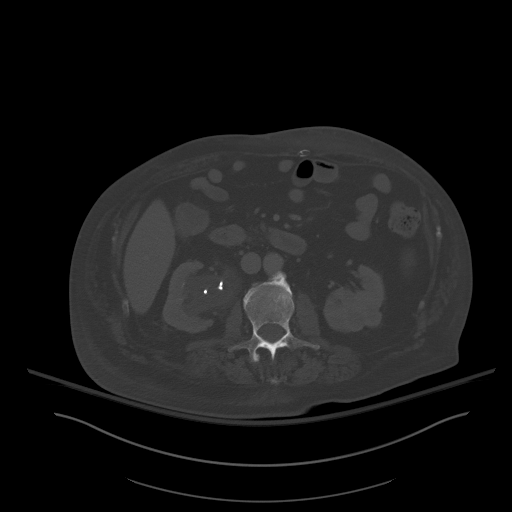
[im 83/105  soft-tissue]
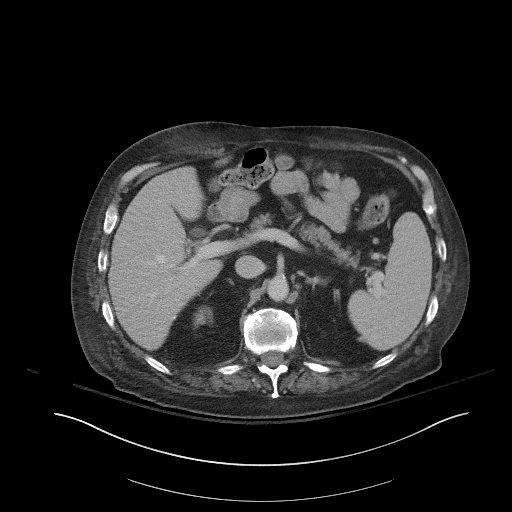
[im 88/105  soft-tissue]
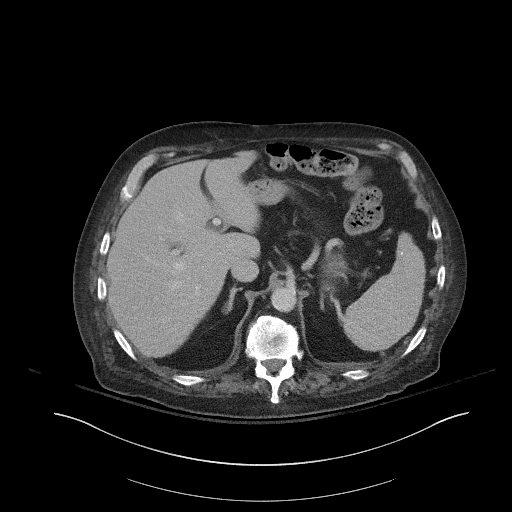
[im 99/105  soft-tissue]
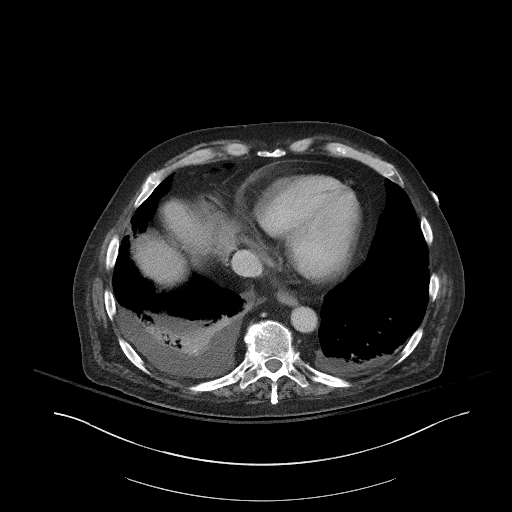

[Series 5: coronal st · coronal · 0.86mm/px · 3 of 105 slices shown]
[im 35/105  soft-tissue]
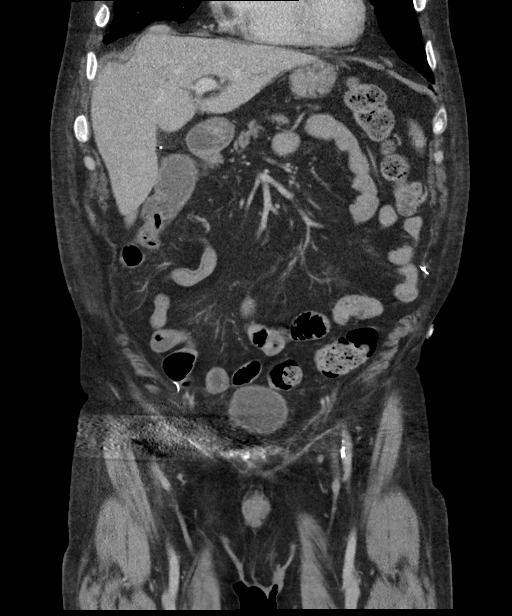
[im 47/105  soft-tissue]
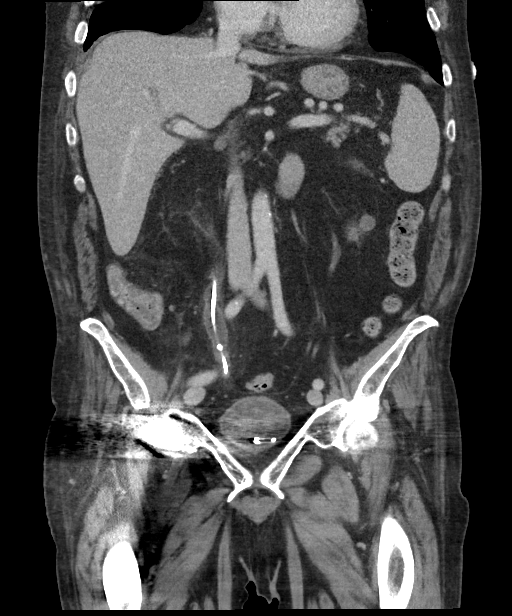
[im 58/105  soft-tissue]
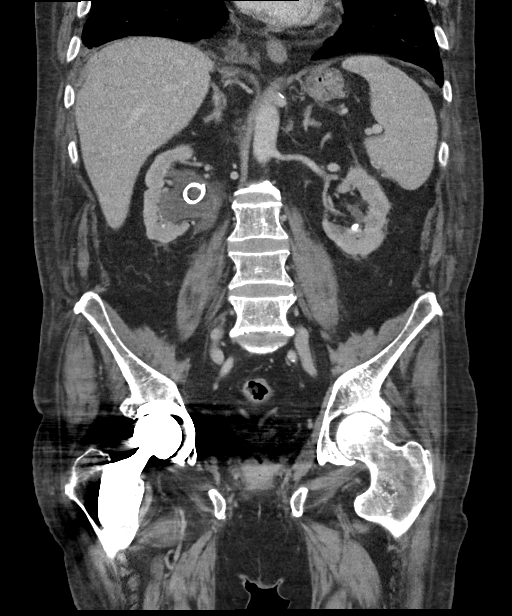

[15 of 46 positions shown; findings below may reference images not displayed]

FINDINGS: Lower chest: Small to moderate right and trace left pleural
effusions with areas of passive subsegmental atelectasis in the
lower lobes of the lungs bilaterally.

Hepatobiliary: No suspicious cystic or solid hepatic lesions. No
intra or extrahepatic biliary ductal dilatation. Status post
cholecystectomy.

Pancreas: No pancreatic mass. No pancreatic ductal dilatation. No
pancreatic or peripancreatic fluid collections or inflammatory
changes.

Spleen: Unremarkable.

Adrenals/Urinary Tract: Nonobstructive calculi are present within
the collecting systems of both kidneys measuring up to 9 mm in the
upper pole collecting system of the right kidney and 1 cm in the
lower pole collecting system of left kidney. Right-sided double-J
ureteral stent in position with proximal loop reformed in the right
renal pelvis and distal loop reformed in the region of the urinary
bladder. Along the course of the right ureter there are 2 small
calculi, largest of which is in the mid ureter (coronal image 47 of
series 5 and axial image 60 of series 2) measuring 5 mm. Smaller 3
mm distal ureteral calculus also noted on axial image 67 of series
2. There is moderate right hydroureteronephrosis with extensive
periureteric soft tissue stranding. No calculi are noted along the
course of the left ureter or within the lumen of the urinary
bladder. In the lower pole of the left kidney there is a 1.2 cm
exophytic lesion anteriorly, compatible with a simple cyst. No
suspicious renal lesions. Bilateral adrenal glands are normal in
appearance. Urinary bladder is nearly decompressed, but otherwise
unremarkable in appearance.

Stomach/Bowel: The appearance of the stomach is normal. There is no
pathologic dilatation of small bowel or colon. Normal appendix.

Vascular/Lymphatic: Aortic atherosclerosis, without evidence of
aneurysm or dissection in the abdominal or pelvic vasculature. No
lymphadenopathy noted in the abdomen or pelvis.

Reproductive: Prostate gland and seminal vesicles are unremarkable
in appearance.

Other: No significant volume of ascites.  No pneumoperitoneum.

Musculoskeletal: Chronic appearing T12 compression fracture with 20%
loss of anterior vertebral body height. There are no aggressive
appearing lytic or blastic lesions noted in the visualized portions
of the skeleton. Status post right hip arthroplasty. Multifocal
edema in the subcutaneous fat of the anterior abdominal wall.
IMPRESSION: 1. There is what appears to be mild edema in the subcutaneous fat of
the anterior abdominal wall, which could correlate to areas of soft
tissue contusion. No other signs of significant acute traumatic
injury are noted elsewhere in the abdomen or pelvis.
2. Right-sided double-J ureteral stent in position with 2 stones
along the course of the right ureter and moderate right
hydroureteronephrosis which has slightly increased compared to the
prior examination, as above. Multiple additional nonobstructive
calculi are noted in the collecting systems of both kidneys
measuring up to 1 cm in the lower pole collecting system of left
kidney.
3. Small to moderate right and trace left pleural effusions with
areas of passive subsegmental atelectasis in the lower lobes of the
lungs bilaterally.
4. Aortic atherosclerosis.
5. Additional incidental findings, as above.

## 2021-02-11 IMAGING — CT CT HEAD W/O CM
3 series · 16 of 47 positions shown, 19 images · non-contrast
Comparison: Recent head CT [DATE].

CLINICAL DATA: 74-year-old male fell out of bed. Right side pain.
Leukemia, on chemotherapy.

EXAM:
CT HEAD WITHOUT CONTRAST
TECHNIQUE: Contiguous axial images were obtained from the base of the skull
through the vertex without intravenous contrast.

[Series 2: head wo · axial · 0.46mm/px · z∈[-115,+35]mm · 10 of 36 slices shown, 13 images]
[im 3/36  brain]
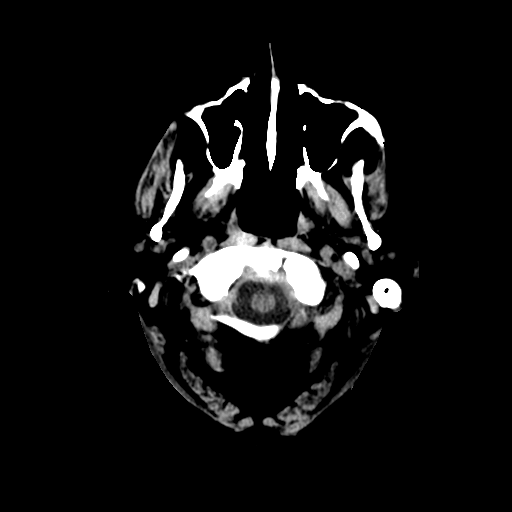
[im 3/36  bone]
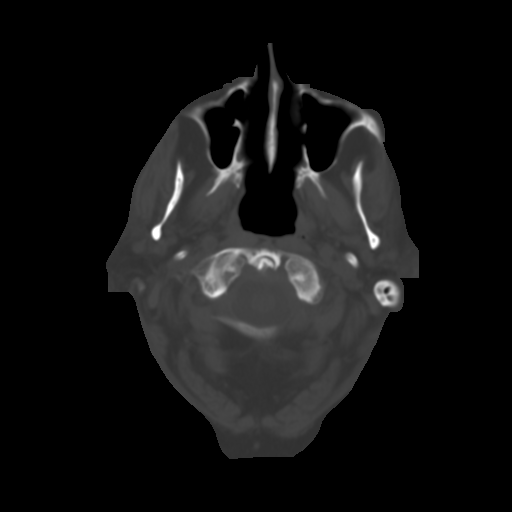
[im 7/36  brain]
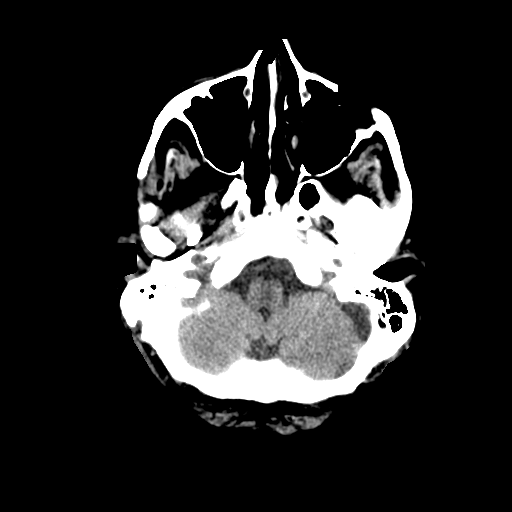
[im 10/36  brain]
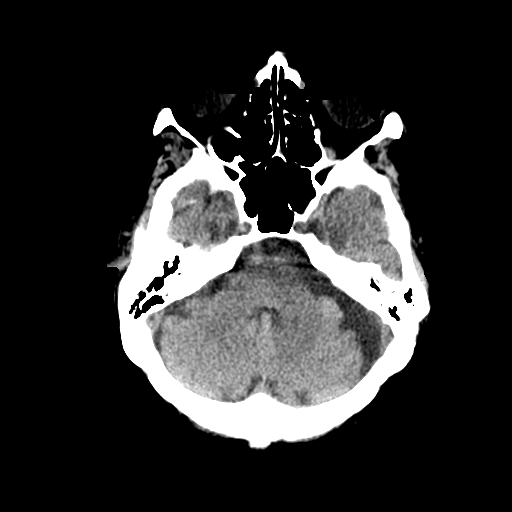
[im 13/36  brain]
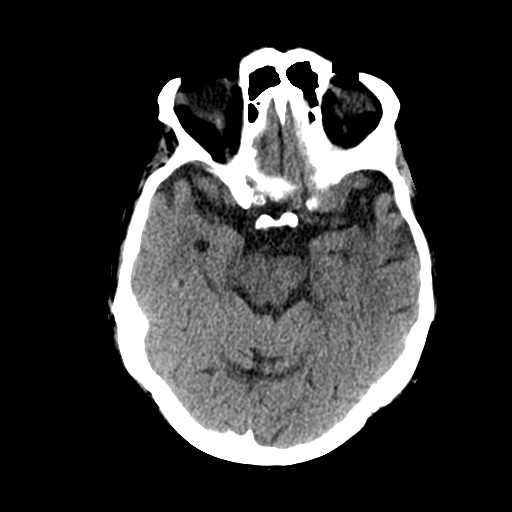
[im 16/36  brain]
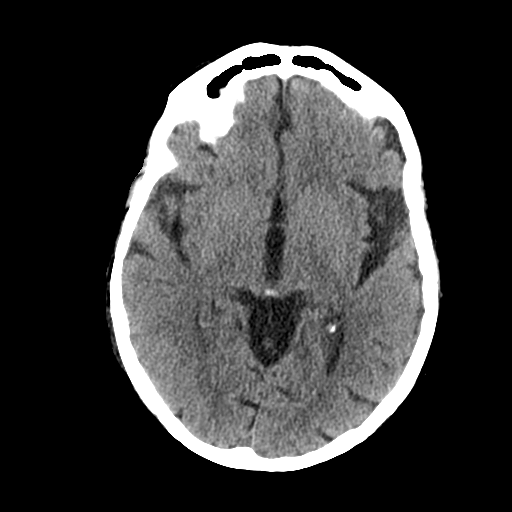
[im 16/36  bone]
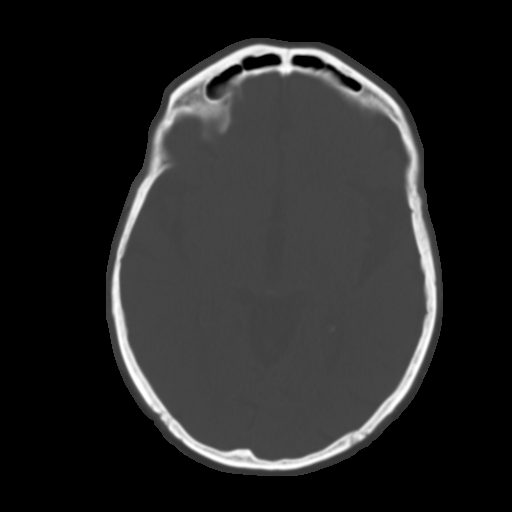
[im 20/36  brain]
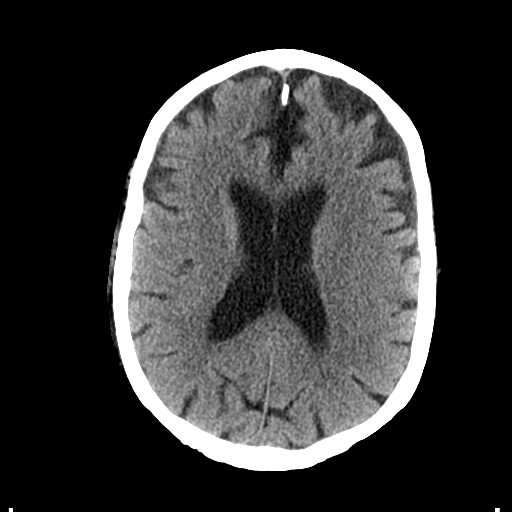
[im 23/36  brain]
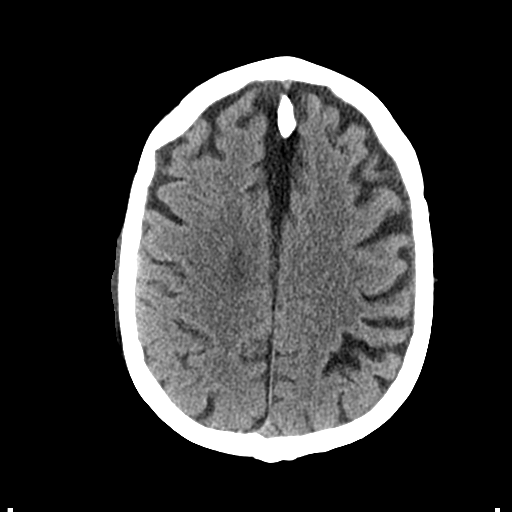
[im 27/36  brain]
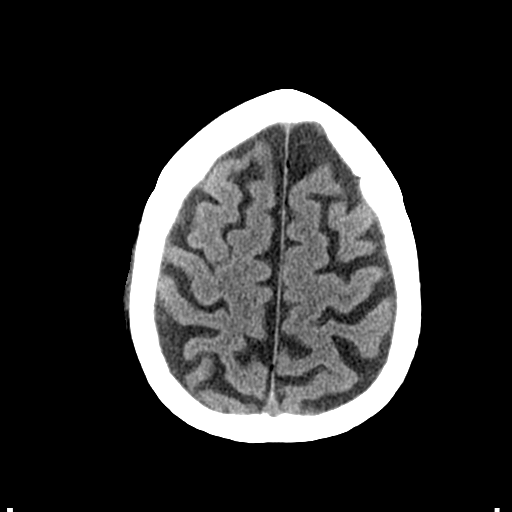
[im 29/36  brain]
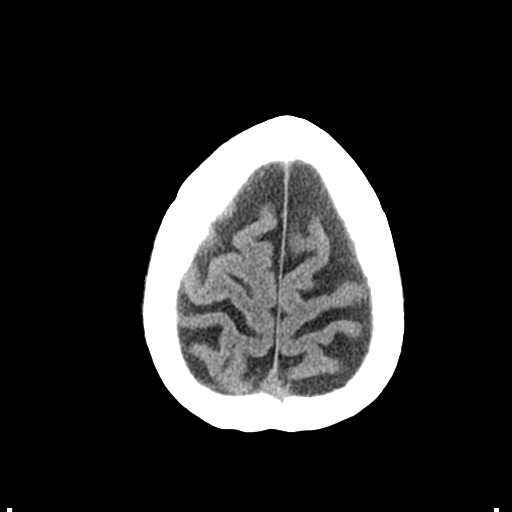
[im 29/36  bone]
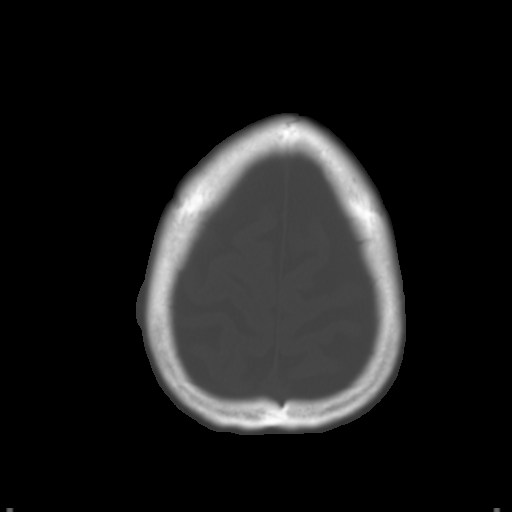
[im 33/36  brain]
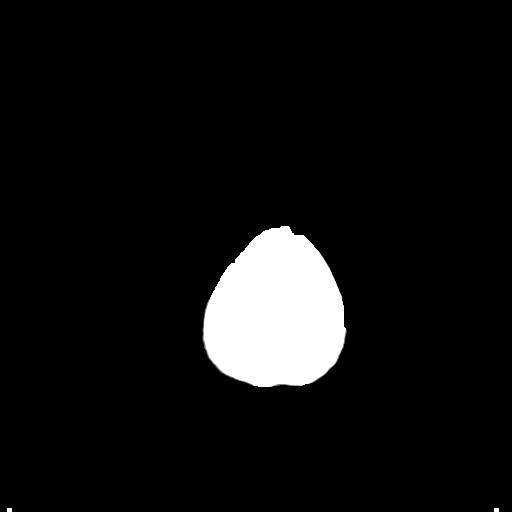

[Series 4: coronal soft tissue · coronal · 0.35mm/px · 3 of 74 slices shown]
[im 25/74  brain]
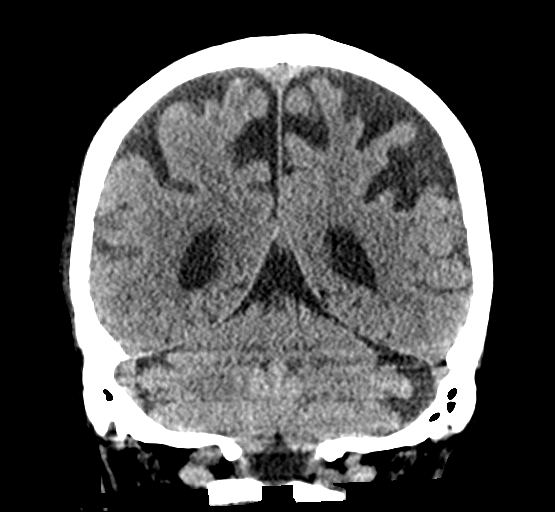
[im 33/74  brain]
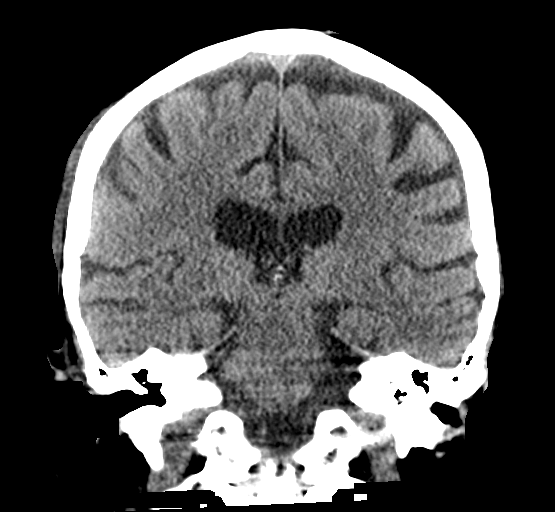
[im 41/74  brain]
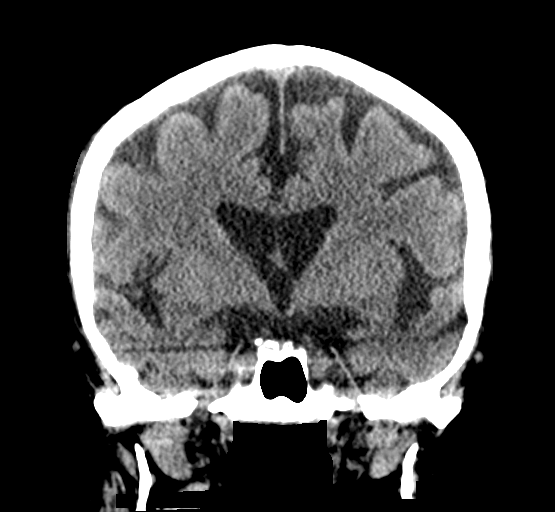

[Series 5: sagittal soft tissue · sagittal · 0.35mm/px · 3 of 62 slices shown]
[im 21/62  brain]
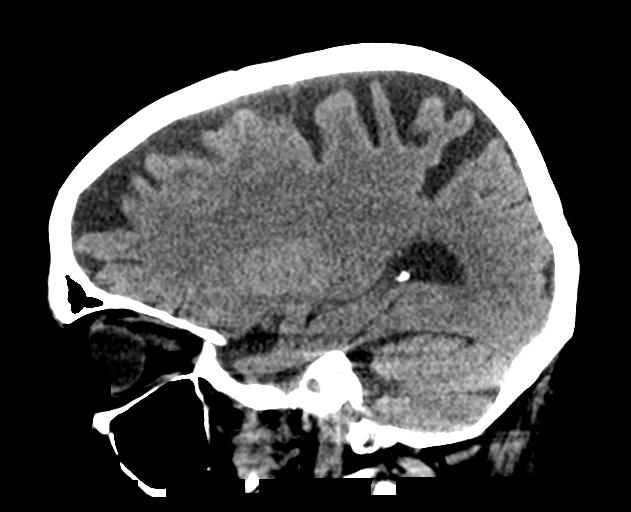
[im 31/62  brain]
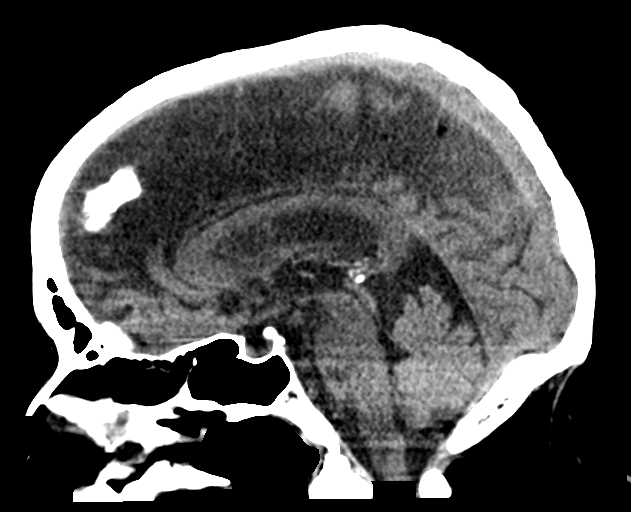
[im 41/62  brain]
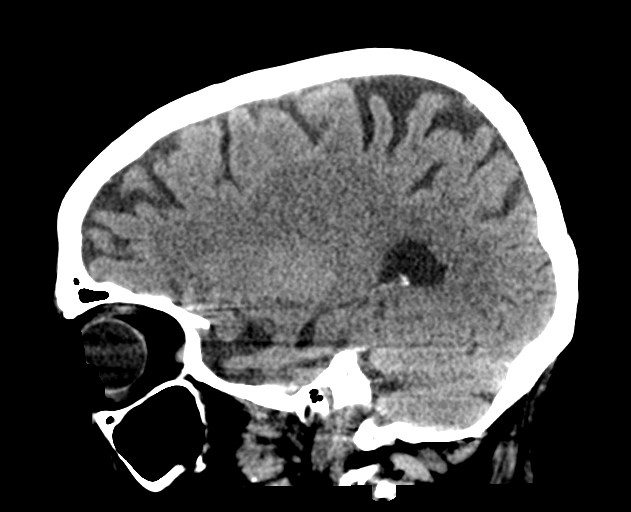

[16 of 47 positions shown; findings below may reference images not displayed]

FINDINGS: Brain: Stable cerebral volume. No midline shift, ventriculomegaly,
mass effect, evidence of mass lesion, intracranial hemorrhage or
evidence of cortically based acute infarction. Gray-white matter
differentiation remains normal for age.

Vascular: Mild Calcified atherosclerosis at the skull base. No
suspicious intracranial vascular hyperdensity.

Skull: No fracture or suspicious osseous lesion identified.

Sinuses/Orbits: Visualized paranasal sinuses and mastoids are stable
and well aerated.

Other: Right lateral convexity scalp hematoma (series 3, image 62).
Underlying calvarium intact. Other orbit and scalp soft tissues
appears stable.
IMPRESSION: 1. Right side scalp hematoma without underlying skull fracture.
2. Stable and negative for age non contrast CT appearance of the
brain.

## 2021-02-11 IMAGING — CR DG HIP (WITH OR WITHOUT PELVIS) 2-3V*R*
1 series · 4 of 4 positions shown · non-contrast
Comparison: No priors.

CLINICAL DATA: 74-year-old male with history of trauma from a fall
out of bed. Right hip pain.

EXAM:
DG HIP (WITH OR WITHOUT PELVIS) 2-3V RIGHT

[Series 1: dg hip unilat w or w/o pelvis 2-3 views  · non-contrast · 0.14mm/px · 4 of 4 slices shown]
[im 1/4]
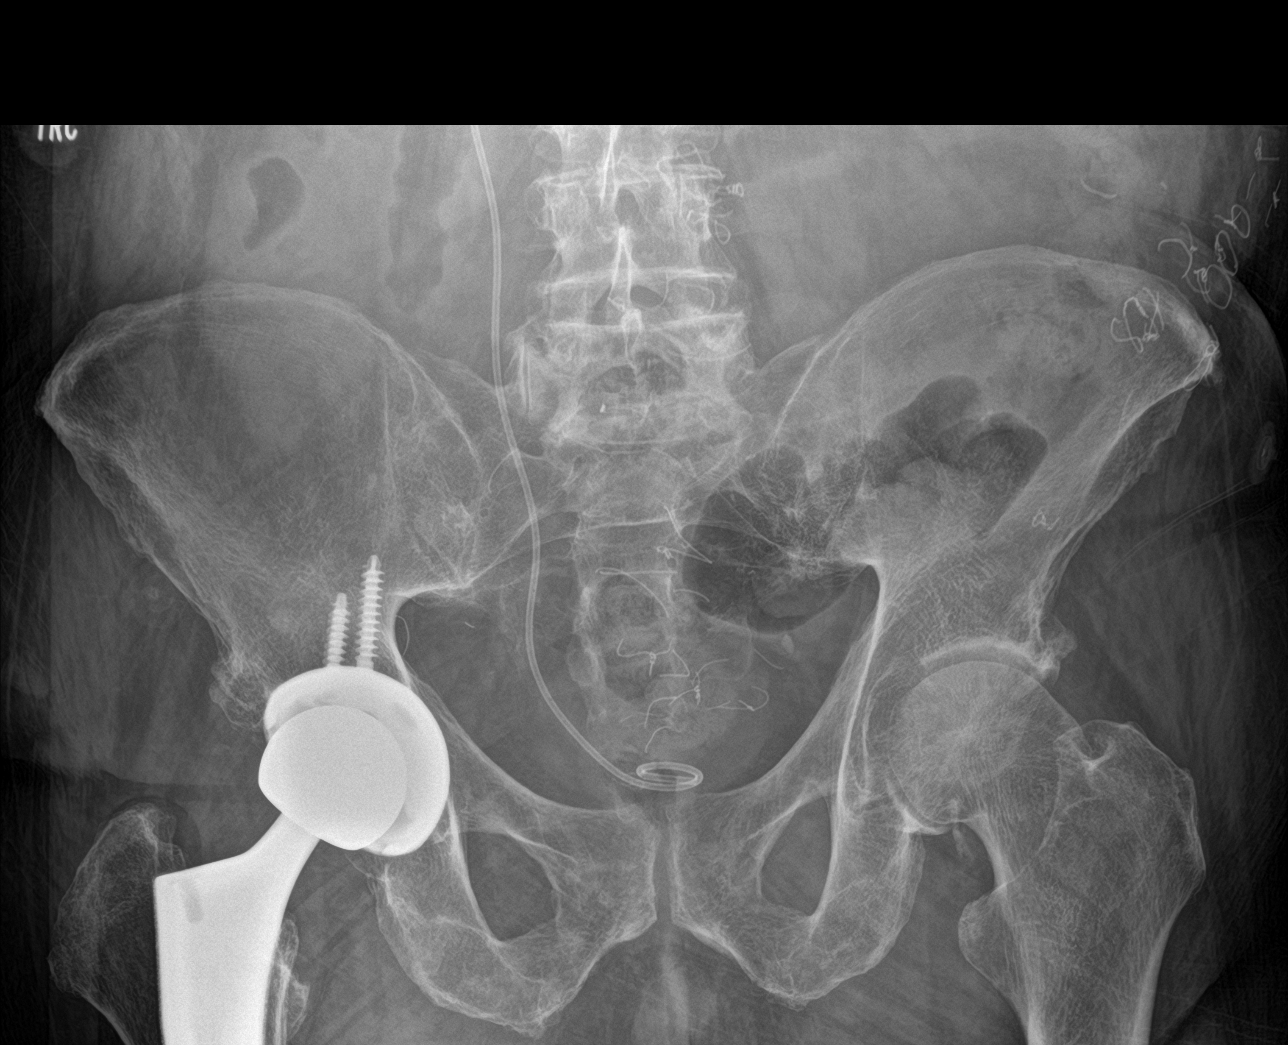
[im 2/4]
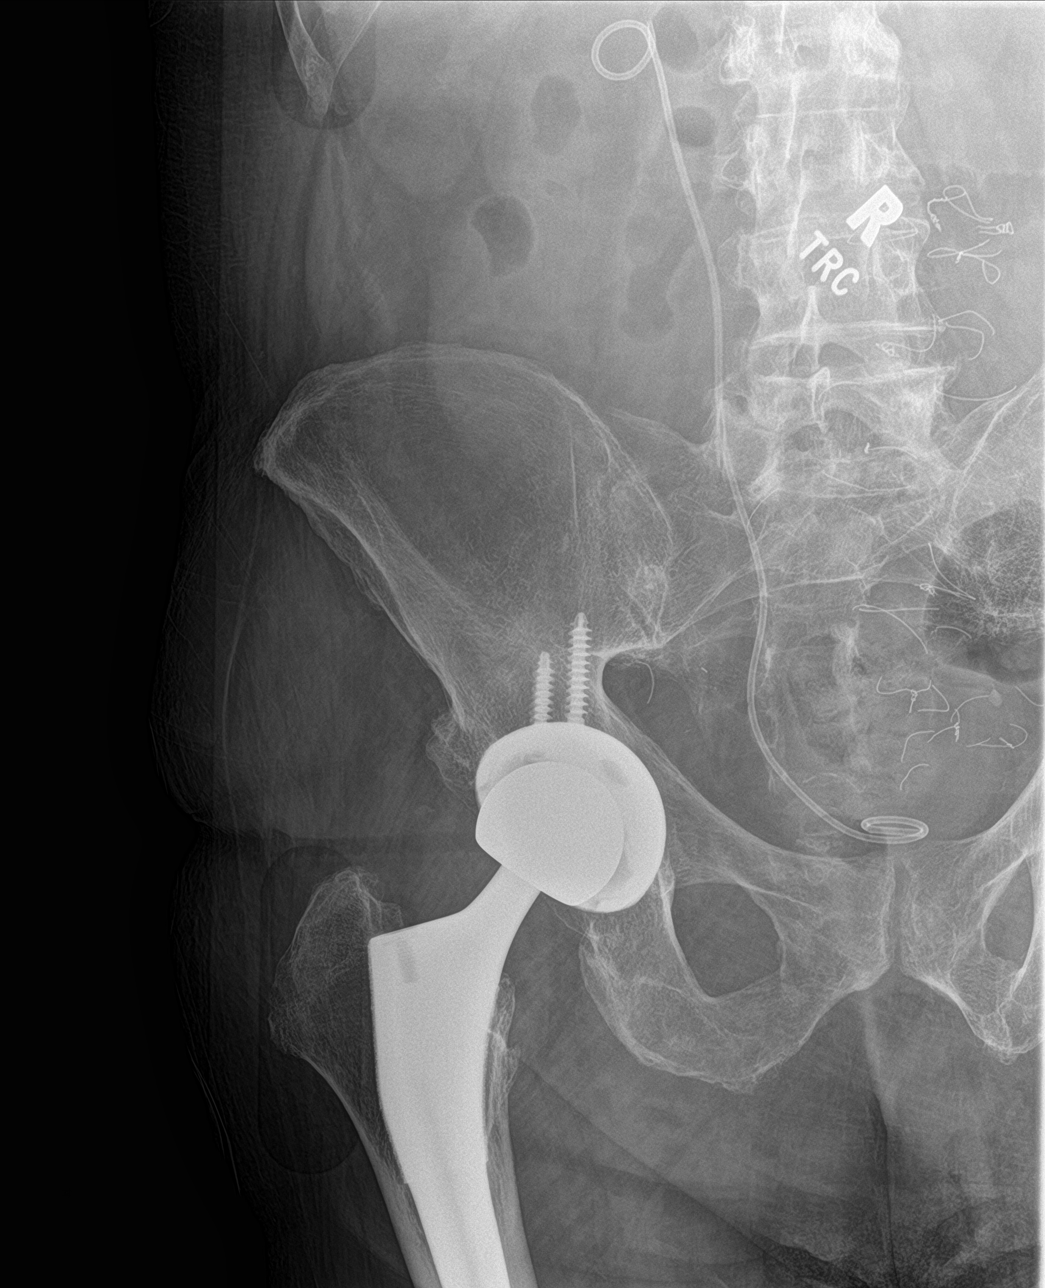
[im 3/4]
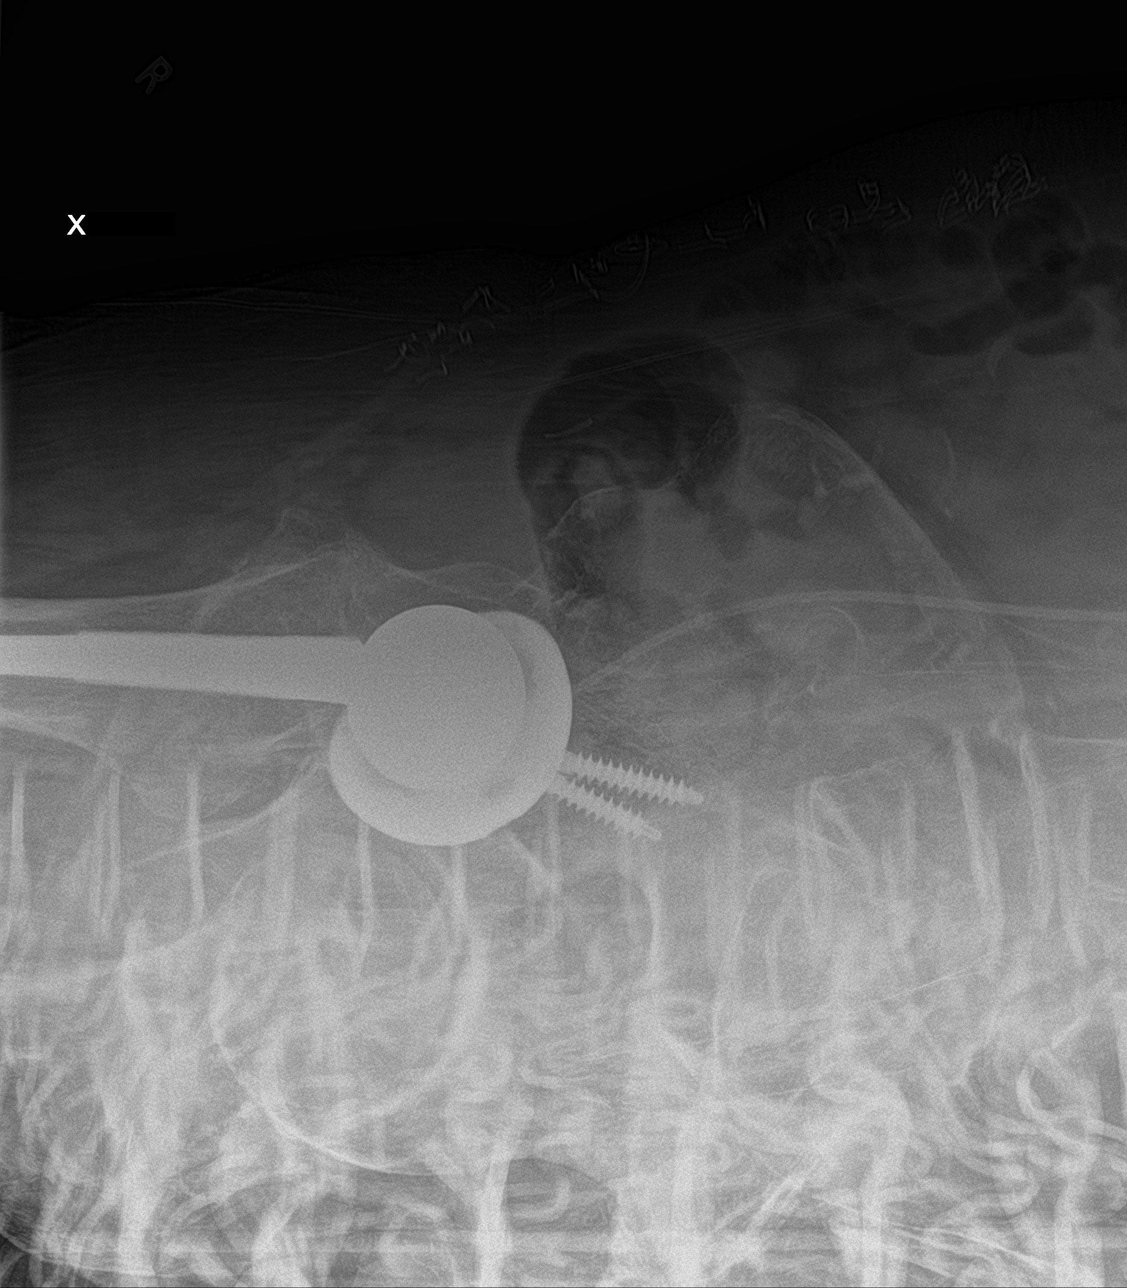
[im 4/4]
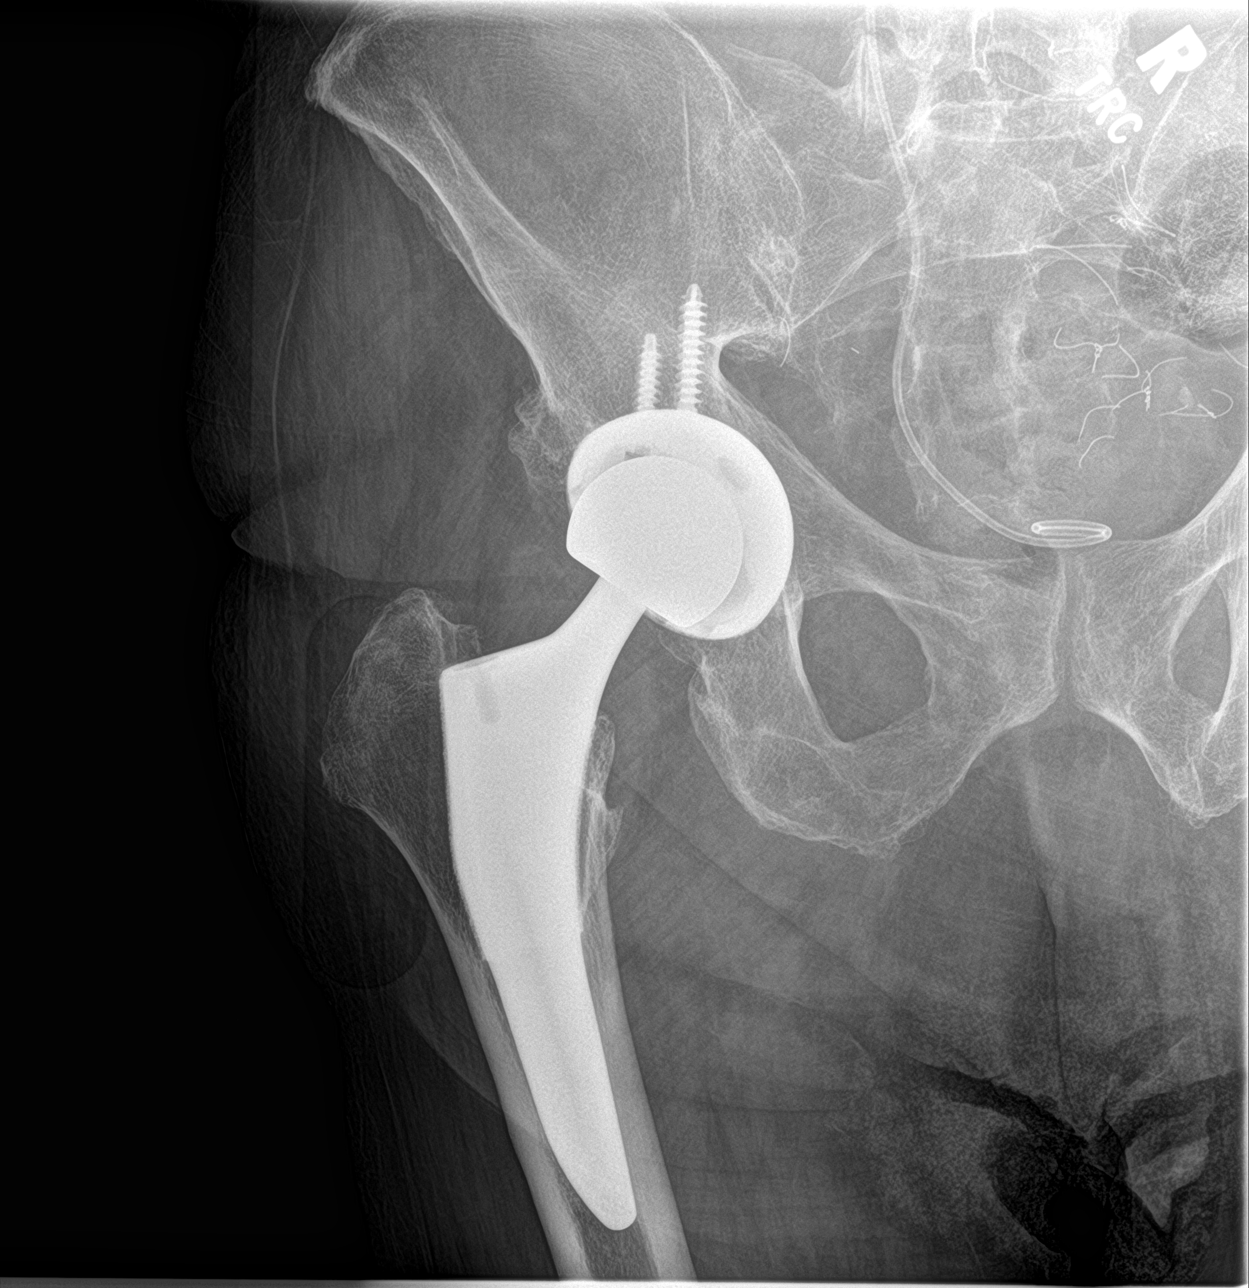

[4 of 4 positions shown; findings below may reference images not displayed]

FINDINGS: Patient is status post right hip arthroplasty. The femoral and
acetabular components of the prosthesis appear well seated without
definite periprosthetic fracture or other acute abnormality. Bony
pelvic ring appears intact. Left proximal femur as visualized
appears intact. Joint space narrowing, subchondral sclerosis,
subchondral cyst formation and osteophyte formation is noted in the
left hip joint, compatible with moderate to severe osteoarthritis.
The in the of what appears to be a right-sided ureteral stent is
noted, with the distal loop reformed in the central pelvis,
presumably within the lumen of the urinary bladder. Multiple sutures
are noted in the low anatomic pelvis, mid abdomen and left side of
the abdomen.
IMPRESSION: 1. No acute radiographic abnormality of the bony pelvis or right
hip.
2. Status post right hip arthroplasty.
3. Moderate to severe left hip joint osteoarthritis.

## 2021-02-11 SURGERY — CYSTOSCOPY, FLEXIBLE, WITH STENT REPLACEMENT
Anesthesia: Monitor Anesthesia Care | Laterality: Right

## 2021-02-11 MED ORDER — DEXTROSE 5 % IV SOLN
10.0000 mg/kg | Freq: Three times a day (TID) | INTRAVENOUS | Status: DC
  Administered 2021-02-11 – 2021-02-12 (×3): 855 mg via INTRAVENOUS
  Filled 2021-02-11 (×5): qty 17.1

## 2021-02-11 MED ORDER — SODIUM CHLORIDE 0.9 % IV SOLN
1.0000 g | Freq: Once | INTRAVENOUS | Status: AC
  Administered 2021-02-11: 1 g via INTRAVENOUS
  Filled 2021-02-11: qty 1

## 2021-02-11 MED ORDER — PROPOFOL 500 MG/50ML IV EMUL
INTRAVENOUS | Status: AC
  Filled 2021-02-11: qty 50

## 2021-02-11 MED ORDER — VANCOMYCIN HCL 1500 MG/300ML IV SOLN
1500.0000 mg | INTRAVENOUS | Status: DC
  Filled 2021-02-11: qty 300

## 2021-02-11 MED ORDER — SODIUM CHLORIDE 0.9 % IR SOLN
Status: DC | PRN
  Administered 2021-02-11: 10 mL

## 2021-02-11 MED ORDER — FENTANYL CITRATE (PF) 100 MCG/2ML IJ SOLN
INTRAMUSCULAR | Status: AC
  Filled 2021-02-11: qty 2

## 2021-02-11 MED ORDER — MORPHINE SULFATE (PF) 2 MG/ML IV SOLN
1.0000 mg | INTRAVENOUS | Status: DC | PRN
  Administered 2021-02-11 – 2021-02-12 (×5): 1 mg via INTRAVENOUS
  Filled 2021-02-11 (×4): qty 1

## 2021-02-11 MED ORDER — VANCOMYCIN HCL 750 MG/150ML IV SOLN
750.0000 mg | Freq: Once | INTRAVENOUS | Status: AC
  Administered 2021-02-11: 750 mg via INTRAVENOUS
  Filled 2021-02-11: qty 150

## 2021-02-11 MED ORDER — SODIUM CHLORIDE 0.9 % IV SOLN: 10.0000 mL/h | Freq: Once | INTRAVENOUS | Status: DC

## 2021-02-11 MED ORDER — POTASSIUM CHLORIDE CRYS ER 20 MEQ PO TBCR
40.0000 meq | EXTENDED_RELEASE_TABLET | Freq: Once | ORAL | Status: AC
  Administered 2021-02-11: 40 meq via ORAL
  Filled 2021-02-11: qty 2

## 2021-02-11 MED ORDER — TBO-FILGRASTIM 300 MCG/0.5ML ~~LOC~~ SOSY
300.0000 ug | PREFILLED_SYRINGE | Freq: Every day | SUBCUTANEOUS | Status: AC
  Administered 2021-02-11: 300 ug via SUBCUTANEOUS
  Filled 2021-02-11: qty 0.5

## 2021-02-11 MED ORDER — ACETAMINOPHEN 500 MG PO TABS
1000.0000 mg | ORAL_TABLET | Freq: Once | ORAL | Status: AC
  Administered 2021-02-11: 1000 mg via ORAL
  Filled 2021-02-11: qty 2

## 2021-02-11 MED ORDER — POLYETHYLENE GLYCOL 3350 17 G PO PACK: 17.0000 g | PACK | Freq: Every day | ORAL | Status: DC | PRN

## 2021-02-11 MED ORDER — SODIUM CHLORIDE 0.9 % IV SOLN: 300.0000 mg | INTRAVENOUS | Status: DC

## 2021-02-11 MED ORDER — CHLORHEXIDINE GLUCONATE CLOTH 2 % EX PADS
6.0000 | MEDICATED_PAD | Freq: Every day | CUTANEOUS | Status: DC
  Administered 2021-02-11 – 2021-02-12 (×2): 6 via TOPICAL

## 2021-02-11 MED ORDER — PROPOFOL 500 MG/50ML IV EMUL
INTRAVENOUS | Status: DC | PRN
  Administered 2021-02-11: 100 ug/kg/min via INTRAVENOUS

## 2021-02-11 MED ORDER — MORPHINE SULFATE (PF) 2 MG/ML IV SOLN
INTRAVENOUS | Status: AC
  Filled 2021-02-11: qty 1

## 2021-02-11 MED ORDER — FENTANYL CITRATE (PF) 100 MCG/2ML IJ SOLN
INTRAMUSCULAR | Status: DC | PRN
  Administered 2021-02-11 (×4): 25 ug via INTRAVENOUS

## 2021-02-11 MED ORDER — PROPOFOL 10 MG/ML IV BOLUS
INTRAVENOUS | Status: DC | PRN
  Administered 2021-02-11: 40 mg via INTRAVENOUS

## 2021-02-11 MED ORDER — IOHEXOL 180 MG/ML  SOLN
INTRAMUSCULAR | Status: DC | PRN
  Administered 2021-02-11: 10 mL

## 2021-02-11 MED ORDER — IOHEXOL 350 MG/ML SOLN
75.0000 mL | Freq: Once | INTRAVENOUS | Status: AC | PRN
  Administered 2021-02-11: 75 mL via INTRAVENOUS

## 2021-02-11 MED ORDER — SODIUM CHLORIDE 0.9 % IV SOLN
2.0000 g | Freq: Two times a day (BID) | INTRAVENOUS | Status: DC
  Administered 2021-02-11 – 2021-02-12 (×2): 2 g via INTRAVENOUS
  Filled 2021-02-11 (×4): qty 2

## 2021-02-11 MED ORDER — ACETAMINOPHEN 325 MG PO TABS
ORAL_TABLET | ORAL | Status: AC
  Filled 2021-02-11: qty 2

## 2021-02-11 MED ORDER — LACTATED RINGERS IV BOLUS (SEPSIS)
1000.0000 mL | Freq: Once | INTRAVENOUS | Status: AC
  Administered 2021-02-11: 1000 mL via INTRAVENOUS

## 2021-02-11 MED ORDER — PANTOPRAZOLE SODIUM 40 MG IV SOLR
40.0000 mg | Freq: Every day | INTRAVENOUS | Status: DC
  Administered 2021-02-11: 40 mg via INTRAVENOUS
  Filled 2021-02-11: qty 40

## 2021-02-11 MED ORDER — NOREPINEPHRINE 4 MG/250ML-% IV SOLN
0.0000 ug/min | INTRAVENOUS | Status: DC
  Administered 2021-02-11: 2 ug/min via INTRAVENOUS
  Filled 2021-02-11: qty 250

## 2021-02-11 MED ORDER — VANCOMYCIN HCL IN DEXTROSE 1-5 GM/200ML-% IV SOLN
1000.0000 mg | Freq: Once | INTRAVENOUS | Status: AC
  Administered 2021-02-11: 1000 mg via INTRAVENOUS
  Filled 2021-02-11: qty 200

## 2021-02-11 MED ORDER — LACTATED RINGERS IV BOLUS
1000.0000 mL | Freq: Once | INTRAVENOUS | Status: AC
  Administered 2021-02-11: 1000 mL via INTRAVENOUS

## 2021-02-11 MED ORDER — FLUCONAZOLE IN SODIUM CHLORIDE 400-0.9 MG/200ML-% IV SOLN
400.0000 mg | INTRAVENOUS | Status: DC
  Administered 2021-02-12: 400 mg via INTRAVENOUS
  Filled 2021-02-11: qty 200

## 2021-02-11 MED ORDER — MAGNESIUM SULFATE 4 GM/100ML IV SOLN
4.0000 g | Freq: Once | INTRAVENOUS | Status: AC
  Administered 2021-02-11: 4 g via INTRAVENOUS
  Filled 2021-02-11: qty 100

## 2021-02-11 MED ORDER — LACTATED RINGERS IV SOLN: INTRAVENOUS | Status: AC

## 2021-02-11 MED ORDER — LACTATED RINGERS IV BOLUS
500.0000 mL | Freq: Once | INTRAVENOUS | Status: AC
  Administered 2021-02-11: 500 mL via INTRAVENOUS

## 2021-02-11 MED ORDER — LACTATED RINGERS IV BOLUS (SEPSIS): 1000.0000 mL | Freq: Once | INTRAVENOUS | Status: DC

## 2021-02-11 MED ORDER — FLUCONAZOLE IN SODIUM CHLORIDE 400-0.9 MG/200ML-% IV SOLN
800.0000 mg | Freq: Once | INTRAVENOUS | Status: AC
  Administered 2021-02-11: 400 mg via INTRAVENOUS
  Filled 2021-02-11: qty 400

## 2021-02-11 MED ORDER — ACETAMINOPHEN 325 MG PO TABS
650.0000 mg | ORAL_TABLET | ORAL | Status: DC | PRN
  Administered 2021-02-11: 650 mg via ORAL
  Filled 2021-02-11: qty 2

## 2021-02-11 SURGICAL SUPPLY — 19 items
BAG DRAIN CYSTO-URO LG1000N (MISCELLANEOUS) ×2 IMPLANT
BRUSH SCRUB EZ 1% IODOPHOR (MISCELLANEOUS) ×2 IMPLANT
CATH URETL OPEN 5X70 (CATHETERS) IMPLANT
GAUZE 4X4 16PLY ~~LOC~~+RFID DBL (SPONGE) ×2 IMPLANT
GLIDEWIRE STR 0.035 150CM 3CM (WIRE) ×2 IMPLANT
GLOVE SURG UNDER POLY LF SZ7.5 (GLOVE) ×2 IMPLANT
GOWN STRL REUS W/ TWL XL LVL3 (GOWN DISPOSABLE) ×1 IMPLANT
GOWN STRL REUS W/TWL XL LVL3 (GOWN DISPOSABLE) ×2
GUIDEWIRE STR DUAL SENSOR (WIRE) ×2 IMPLANT
IV NS IRRIG 3000ML ARTHROMATIC (IV SOLUTION) ×2 IMPLANT
KIT TURNOVER CYSTO (KITS) ×2 IMPLANT
PACK CYSTO AR (MISCELLANEOUS) ×2 IMPLANT
SET CYSTO W/LG BORE CLAMP LF (SET/KITS/TRAYS/PACK) ×2 IMPLANT
STENT CONTOUR 7FRX24 (STENTS) ×2 IMPLANT
STENT URET 6FRX24 CONTOUR (STENTS) IMPLANT
STENT URET 6FRX26 CONTOUR (STENTS) IMPLANT
SURGILUBE 2OZ TUBE FLIPTOP (MISCELLANEOUS) ×2 IMPLANT
WATER STERILE IRR 1000ML POUR (IV SOLUTION) ×2 IMPLANT
WATER STERILE IRR 500ML POUR (IV SOLUTION) ×2 IMPLANT

## 2021-02-11 NOTE — ED Triage Notes (Addendum)
Pt slid out of bed this am injuring right hip and arm. Pt with bruising on right arm. Pt denies head injury. Pt abd wall is red and swollen, pt states he has been getting chemo in abd.

## 2021-02-11 NOTE — Op Note (Signed)
Preoperative diagnosis:  Septic shock Right hydronephrosis with obstruction  Postoperative diagnosis:  Same  Procedure: Cystoscopy Right ureteral stent removal Right ureteral stent placement Right retrograde pyelogram with interpretation Intraoperative fluoroscopy and <1  Surgeon: Abbie Sons, MD  Anesthesia: MAC  Complications: None  Intraoperative findings:  Urethra normal in caliber without stricture Nonocclusive prostate Inflammatory changes right hemitrigone secondary to indwelling stent Mild stent encrustation Persistent contrast with moderate hydronephrosis and hydroureter to the proximal/mid ureteral junction (12-hour postcontrast administration) Right retrograde pyelogram with moderate hydronephrosis and no contrast extravasation   EBL: Minimal  Specimens: Urine right renal pelvis for culture  Indication: Gordon Farrell is a 75 y.o. male status post right ureteroscopic stone removal at [facility] 10/03/2020.  Intraoperative finding remarkable for occlusive papillary tissue proximal to the stone with biopsies showing reactive change.  He has been scheduled several times for follow-up ureteroscopy/stent removal however was canceled on several occasions due to hospitalization, need for cardiac clearance and recent diagnosis of AML.    Presented to ED this morning in septic shock, fever to 103 degrees.  CT showed increased right hydronephrosis compared with a prior CT with stent June 2022.  A 4-hour postcontrast KUB showed retained contrast with moderate hydronephrosis/hydroureter to the proximal/mid ureteral junction.  After reviewing the management options for treatment, he elected to proceed with the above surgical procedure(s). We have discussed the potential benefits and risks of the procedure, side effects of the proposed treatment, the likelihood of the patient achieving the goals of the procedure, and any potential problems that might occur during the procedure or  recuperation. Informed consent has been obtained.  Description of procedure:  The patient was taken to the operating room and transferred to the OR table.  Sedation was obtained by anesthesia and the patient was placed in the dorsal lithotomy position, prepped and draped in the usual sterile fashion. A preoperative time-out was performed.   A 21 French cystoscope was lubricated, passed per urethra and advanced proximally into the bladder with findings as described above.  A 31F open-ended ureteral catheter was placed through the cystoscope and positioned at the right UO.  A 0.035 Glidewire was then placed through the ureteral catheter into the UO alongside the indwelling stent and advanced without difficulty into the renal pelvis under fluoroscopic guidance.  The ureteral catheter was advanced over the guidewire to the renal pelvis and the guidewire was removed.  Slightly turbid blood-tinged urine was aspirated and sent for culture.  Retrograde pyelogram was then performed to ensure correct location of the ureteral catheter and showed no extravasation.  A 0.038 sensor wire was placed through the ureteral catheter and the ureteral catheter was removed.  The cystoscope was removed and repassed alongside the guidewire.  The previously placed ureteral stent was grasped with endoscopic forceps and removed under fluoroscopic guidance without difficulty.  The guidewire was backloaded on the cystoscope which was readvanced into the bladder.  A 51F/24 cm Contour ureteral stent was advanced over the guidewire without difficulty.  The proximal curl was within an upper pole calyx and the distal end of the stent was well positioned in the bladder.  There was brisk efflux blood-tinged urine through the stent after placement.  A 16 French Foley catheter was placed with return of pink-tinged urine and placed to gravity drainage.    He was transported back to the ICU in guarded condition.  Recommendation: Indwelling  Foley catheter to maximize drainage until he is showing signs of clinical improvement  Abbie Sons, M.D.

## 2021-02-11 NOTE — Consult Note (Signed)
Urology Consult  Requesting physician: Dr. Alfred Levins  Reason for consultation: Sepsis with right hydronephrosis  Chief Complaint: Weakness  History of Present Illness: Gordon Farrell is a 75 y.o. male with significant comorbidities who presented to the ED this morning after a fall.  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  Completed 5 cycles azacitidine-venetoclax.  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  On Eliquis for recurrent DVT/chronic A. fib/PE.   CT abdomen pelvis performed this morning with moderate right hydronephrosis/hydroureter with an indwelling right ureteral stent.  Urologic history:  Urgent right ureteral stent placement [facility] 08/20/2020 for obstructing right distal ureteral calculi with suspected infection Subsequently underwent right ureteroscopy with stone removal 10/03/2020.  Stone was noted to be impacted and papillary ureteral mucosa occluding the ureteral lumen was noted proximal to the stone.  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  UA >50 RBC, 21-50 WBC and few bacteria  Hypotensive in ED which did not respond to fluid resuscitation and on Levophed  He denies flank  pain but complains of tender abdominal rash and tenderness right hip region and occipital area  Past Medical History:  Diagnosis Date   Anemia    Dysrhythmia    Hypothyroidism     Past Surgical History:  Procedure Laterality Date   BACK SURGERY     CHOLECYSTECTOMY     COLON RESECTION     multiple times   LEFT HEART CATH AND CORONARY ANGIOGRAPHY N/A 10/23/2020   Procedure: LEFT HEART CATH AND CORONARY ANGIOGRAPHY with coronary intervention;  Surgeon: Dionisio David, MD;  Location: [place] [facility];  Service: Cardiovascular;  Laterality: N/A;   TOTAL HIP ARTHROPLASTY  05/06/2010    Home Medications:  Current Meds  Medication Sig   allopurinol (ZYLOPRIM) 300 MG tablet Take 300 mg by mouth daily.   finasteride (PROSCAR) 5 MG tablet Take 5 mg by mouth daily.   furosemide (LASIX) 20 MG tablet Take 20 mg by mouth daily as needed for edema or fluid.   latanoprost (XALATAN) 0.005 % ophthalmic solution Place 1 drop into both eyes at bedtime.   levofloxacin (LEVAQUIN) 500 MG tablet Take 500 mg by mouth daily.   levothyroxine (SYNTHROID) 100 MCG tablet Take 100 mcg by mouth daily.   metoprolol succinate (TOPROL-XL) 25 MG 24 hr tablet Take 25 mg by mouth daily.   pilocarpine (SALAGEN) 5 MG tablet Take 5 mg by mouth 3 (three) times daily.   potassium chloride (KLOR-CON) 10 MEQ tablet Take 10 mEq by mouth daily as needed (supplementation if taking furosemide dose).   pravastatin (PRAVACHOL) 40 MG tablet Take 40 mg by mouth every evening.   prochlorperazine (COMPAZINE) 10 MG tablet Take 10 mg by mouth every 6 (six) hours as needed for nausea.   sertraline (  ZOLOFT) 100 MG tablet Take 100 mg by mouth daily.   timolol (TIMOPTIC) 0.5 % ophthalmic solution Place 1 drop into the right eye 2 (two) times daily.   traMADol (ULTRAM) 50 MG tablet Take 50 mg by mouth every 6 (six) hours as needed for pain.   traZODone (DESYREL) 100 MG tablet Take 100 mg by mouth at bedtime.   valACYclovir (VALTREX) 500  MG tablet Take 500 mg by mouth daily.   venetoclax (VENCLEXTA) 100 MG tablet Take 400 mg by mouth daily. Tablets should be swallowed whole with a meal and a full glass of water.    Allergies: No Known Allergies  Family History  Problem Relation Age of Onset   Osteoporosis Mother     Social History:  reports that he has never smoked. He has never used smokeless tobacco. He reports that he does not drink alcohol and does not use drugs.  ROS: 10 point review of systems negative except for pain over right hip, abdomen and occiput   Physical Exam:  Vital signs in last 24 hours: Temp:  [97.8 F (36.6 C)-103 F (39.4 C)] 98.2 F (36.8 C) (10/09 1015) Pulse Rate:  [88-117] 109 (10/09 1015) Resp:  [12-26] 14 (10/09 1015) BP: (82-120)/(40-61) 120/40 (10/09 1015) SpO2:  [91 %-99 %] 99 % (10/09 1015) Weight:  [85 kg-89 kg] 89 kg (10/09 0927) Constitutional:  Alert, mild distress secondary to pain HEENT: [place] AT, moist mucus membranes.  Trachea midline, no masses Cardiovascular: Irregular rate Respiratory: Normal respiratory effort, lungs clear bilaterally GI: Abdomen is soft, tender, erythematous rash GU: No CVA tenderness Skin: As above Lymph: No cervical or inguinal adenopathy Neurologic: No focal deficits   Laboratory Data:  Recent Labs    02/08/21 1526 02/11/21 0513  WBC 0.2* 0.6*  HGB 8.1* 8.0*  HCT 22.9* 22.4*   Recent Labs    02/08/21 1526 02/11/21 0513  NA 135 136  K 3.4* 3.5  CL 105 103  CO2 23 24  GLUCOSE 119* 123*  BUN 23 29*  CREATININE 1.15 1.36*  CALCIUM 8.1* 8.3*   Recent Labs    02/11/21 0513  INR 1.2   No results for input(s): LABURIN in the last 72 hours. Results for orders placed or performed during the hospital encounter of 02/11/21  Resp Panel by RT-PCR (Flu A&B, Covid) Nasopharyngeal Swab     Status: None   Collection Time: 02/11/21  5:13 AM   Specimen: Nasopharyngeal Swab; Nasopharyngeal(NP) swabs in vial transport medium  Result Value  Ref Range Status   SARS Coronavirus 2 by RT PCR NEGATIVE NEGATIVE Final    Comment: (NOTE) SARS-CoV-2 target nucleic acids are NOT DETECTED.  The SARS-CoV-2 RNA is generally detectable in upper respiratory specimens during the acute phase of infection. The lowest concentration of SARS-CoV-2 viral copies this assay can detect is 138 copies/mL. A negative result does not preclude SARS-Cov-2 infection and should not be used as the sole basis for treatment or other patient management decisions. A negative result may occur with  improper specimen collection/handling, submission of specimen other than nasopharyngeal swab, presence of viral mutation(s) within the areas targeted by this assay, and inadequate number of viral copies(<138 copies/mL). A negative result must be combined with clinical observations, patient history, and epidemiological information. The expected result is Negative.  Fact Sheet for Patients:  EntrepreneurPulse.com.au  Fact Sheet for Healthcare Providers:  IncredibleEmployment.be  This test is no t yet approved or cleared by the [place] FDA and  has  been authorized for detection and/or diagnosis of SARS-CoV-2 by FDA under an Emergency Use Authorization (EUA). This EUA will remain  in effect (meaning this test can be used) for the duration of the COVID-19 declaration under Section 564(b)(1) of the Act, 21 U.S.C.section 360bbb-3(b)(1), unless the authorization is terminated  or revoked sooner.       Influenza A by PCR NEGATIVE NEGATIVE Final   Influenza B by PCR NEGATIVE NEGATIVE Final    Comment: (NOTE) The Xpert Xpress SARS-CoV-2/FLU/RSV plus assay is intended as an aid in the diagnosis of influenza from Nasopharyngeal swab specimens and should not be used as a sole basis for treatment. Nasal washings and aspirates are unacceptable for Xpert Xpress SARS-CoV-2/FLU/RSV testing.  Fact Sheet for  Patients: EntrepreneurPulse.com.au  Fact Sheet for Healthcare Providers: IncredibleEmployment.be  This test is not yet approved or cleared by the [place] FDA and has been authorized for detection and/or diagnosis of SARS-CoV-2 by FDA under an Emergency Use Authorization (EUA). This EUA will remain in effect (meaning this test can be used) for the duration of the COVID-19 declaration under Section 564(b)(1) of the Act, 21 U.S.C. section 360bbb-3(b)(1), unless the authorization is terminated or revoked.  Performed at [facility], [address] [place], [place] [address]   MRSA Next Gen by PCR, Nasal     Status: None   Collection Time: 02/11/21  8:49 AM   Specimen: Nasal Mucosa; Nasal Swab  Result Value Ref Range Status   MRSA by PCR Next Gen NOT DETECTED NOT DETECTED Final    Comment: (NOTE) The GeneXpert MRSA Assay (FDA approved for NASAL specimens only), is one component of a comprehensive MRSA colonization surveillance program. It is not intended to diagnose MRSA infection nor to guide or monitor treatment for MRSA infections. Test performance is not FDA approved in patients less than 68 years old. Performed at [facility], [address], [place], [place] [address]      Radiologic Imaging: Images CT abdomen pelvis today and June 2022 were personally reviewed and interpreted.  A KUB was ordered 4 hours postcontrast which shows persistent contrast with moderate hydronephrosis/hydroureter to the junction of the proximal/mid ureter.  Stent is in good position  DG Shoulder Right  Result Date: 02/11/2021 CLINICAL DATA:  75 year old male with history of trauma from a fall out of bed this morning. Right shoulder pain. EXAM: RIGHT SHOULDER - 2+ VIEW COMPARISON:  No priors. FINDINGS: Three views of the right shoulder demonstrate no acute displaced fracture or dislocation. Humeral head is high-riding, suggesting rotator  cuff pathology. There is joint space narrowing, subchondral sclerosis, subchondral cyst formation and osteophyte formation in the glenohumeral and acromioclavicular joints, compatible with osteoarthritis. Numerous surgical clips project over the right hemithorax. IMPRESSION: 1. No acute radiographic abnormality of the right shoulder. 2. Acromioclavicular and glenohumeral joint osteoarthritis with high-riding humeral head suggesting chronic rotator cuff disease. Electronically Signed   By: Vinnie Langton M.D.   On: 02/11/2021 06:37   CT HEAD WO CONTRAST (5MM)  Result Date: 02/11/2021 CLINICAL DATA:  75 year old male fell out of bed. Right side pain. Leukemia, on chemotherapy. EXAM: CT HEAD WITHOUT CONTRAST TECHNIQUE: Contiguous axial images were obtained from the base of the skull through the vertex without intravenous contrast. COMPARISON:  Recent head CT 02/08/2021. FINDINGS: Brain: Stable cerebral volume. No midline shift, ventriculomegaly, mass effect, evidence of mass lesion, intracranial hemorrhage or evidence of cortically based acute infarction. Gray-white matter differentiation remains normal for age. Vascular: Mild Calcified atherosclerosis  at the skull base. No suspicious intracranial vascular hyperdensity. Skull: No fracture or suspicious osseous lesion identified. Sinuses/Orbits: Visualized paranasal sinuses and mastoids are stable and well aerated. Other: Right lateral convexity scalp hematoma (series 3, image 62). Underlying calvarium intact. Other orbit and scalp soft tissues appears stable. IMPRESSION: 1. Right side scalp hematoma without underlying skull fracture. 2. Stable and negative for age non contrast CT appearance of the brain. Electronically Signed   By: Genevie Ann M.D.   On: 02/11/2021 06:55   CT Cervical Spine Wo Contrast  Result Date: 02/11/2021 CLINICAL DATA:  75 year old male fell out of bed. Right side pain. Leukemia, on chemotherapy. EXAM: CT CERVICAL SPINE WITHOUT CONTRAST  TECHNIQUE: Multidetector CT imaging of the cervical spine was performed without intravenous contrast. Multiplanar CT image reconstructions were also generated. COMPARISON:  Recent CT cervical spine 02/08/2021. CT head today. Chest CT 10/26/2020. FINDINGS: Alignment: Stable cervical lordosis. Cervicothoracic junction alignment is within normal limits. Bilateral posterior element alignment is within normal limits. Skull base and vertebrae: Osteopenia. Visualized skull base is intact. No atlanto-occipital dissociation. C1 and C2 appear intact and aligned. No acute osseous abnormality identified. Soft tissues and spinal canal: No prevertebral fluid or swelling. No visible canal hematoma. Negative noncontrast visible neck soft tissues. Disc levels: Cervical spine degeneration is mild for age and stable. Upper chest: Mild chronic compression fractures of T1 and T3 are stable since June. Layering right pleural effusion in the apex seems to have intermediate fluid density. Otherwise negative lung apices, visible superior mediastinum. IMPRESSION: 1. No acute traumatic injury identified in the cervical spine. 2. Persistent layering pleural effusion in the right lung apex, possibly exudate of. 3. Mild chronic T1 and T3 compression fractures. Electronically Signed   By: Genevie Ann M.D.   On: 02/11/2021 06:59   CT ABDOMEN PELVIS W CONTRAST  Result Date: 02/11/2021 CLINICAL DATA:  75 year old male with history of trauma after falling out of bed this morning. EXAM: CT ABDOMEN AND PELVIS WITH CONTRAST TECHNIQUE: Multidetector CT imaging of the abdomen and pelvis was performed using the standard protocol following bolus administration of intravenous contrast. CONTRAST:  82m OMNIPAQUE IOHEXOL 350 MG/ML SOLN COMPARISON:  CT the abdomen and pelvis 10/27/2020. FINDINGS: Lower chest: Small to moderate right and trace left pleural effusions with areas of passive subsegmental atelectasis in the lower lobes of the lungs bilaterally.  Hepatobiliary: No suspicious cystic or solid hepatic lesions. No intra or extrahepatic biliary ductal dilatation. Status post cholecystectomy. Pancreas: No pancreatic mass. No pancreatic ductal dilatation. No pancreatic or peripancreatic fluid collections or inflammatory changes. Spleen: Unremarkable. Adrenals/Urinary Tract: Nonobstructive calculi are present within the collecting systems of both kidneys measuring up to 9 mm in the upper pole collecting system of the right kidney and 1 cm in the lower pole collecting system of left kidney. Right-sided double-J ureteral stent in position with proximal loop reformed in the right renal pelvis and distal loop reformed in the region of the urinary bladder. Along the course of the right ureter there are 2 small calculi, largest of which is in the mid ureter (coronal image 47 of series 5 and axial image 60 of series 2) measuring 5 mm. Smaller 3 mm distal ureteral calculus also noted on axial image 67 of series 2. There is moderate right hydroureteronephrosis with extensive periureteric soft tissue stranding. No calculi are noted along the course of the left ureter or within the lumen of the urinary bladder. In the lower pole of the left kidney there is  a 1.2 cm exophytic lesion anteriorly, compatible with a simple cyst. No suspicious renal lesions. Bilateral adrenal glands are normal in appearance. Urinary bladder is nearly decompressed, but otherwise unremarkable in appearance. Stomach/Bowel: The appearance of the stomach is normal. There is no pathologic dilatation of small bowel or colon. Normal appendix. Vascular/Lymphatic: Aortic atherosclerosis, without evidence of aneurysm or dissection in the abdominal or pelvic vasculature. No lymphadenopathy noted in the abdomen or pelvis. Reproductive: Prostate gland and seminal vesicles are unremarkable in appearance. Other: No significant volume of ascites.  No pneumoperitoneum. Musculoskeletal: Chronic appearing T12  compression fracture with 20% loss of anterior vertebral body height. There are no aggressive appearing lytic or blastic lesions noted in the visualized portions of the skeleton. Status post right hip arthroplasty. Multifocal edema in the subcutaneous fat of the anterior abdominal wall. IMPRESSION: 1. There is what appears to be mild edema in the subcutaneous fat of the anterior abdominal wall, which could correlate to areas of soft tissue contusion. No other signs of significant acute traumatic injury are noted elsewhere in the abdomen or pelvis. 2. Right-sided double-J ureteral stent in position with 2 stones along the course of the right ureter and moderate right hydroureteronephrosis which has slightly increased compared to the prior examination, as above. Multiple additional nonobstructive calculi are noted in the collecting systems of both kidneys measuring up to 1 cm in the lower pole collecting system of left kidney. 3. Small to moderate right and trace left pleural effusions with areas of passive subsegmental atelectasis in the lower lobes of the lungs bilaterally. 4. Aortic atherosclerosis. 5. Additional incidental findings, as above. Electronically Signed   By: Vinnie Langton M.D.   On: 02/11/2021 06:54   DG Chest Portable 1 View  Result Date: 02/11/2021 CLINICAL DATA:  75 year old male with history of fever. EXAM: PORTABLE CHEST 1 VIEW COMPARISON:  Chest x-ray 10/26/2020. FINDINGS: Lung volumes are normal. Mild diffuse peribronchial cuffing. No consolidative airspace disease. No pleural effusions. No pneumothorax. No pulmonary nodule or mass noted. Pulmonary vasculature and the cardiomediastinal silhouette are within normal limits. Numerous small surgical clips project over the right hemithorax. IMPRESSION: 1. Mild diffuse peribronchial cuffing which could suggest an acute bronchitis. Electronically Signed   By: Vinnie Langton M.D.   On: 02/11/2021 06:34   DG Abd Portable 1V  Result Date:  02/11/2021 CLINICAL DATA:  Follow-up post CT contrast to check for right stent placement. EXAM: PORTABLE ABDOMEN - 1 VIEW COMPARISON:  CT abdomen pelvis February 11, 2021 FINDINGS: Right double pigtail ureteral catheter is identified in good position. Moderate right hydronephrosis is noted. IMPRESSION: Right double pigtail ureteral catheter is identified in good position. Electronically Signed   By: Abelardo Diesel M.D.   On: 02/11/2021 10:38   DG Hip Unilat W or Wo Pelvis 2-3 Views Right  Result Date: 02/11/2021 CLINICAL DATA:  75 year old male with history of trauma from a fall out of bed. Right hip pain. EXAM: DG HIP (WITH OR WITHOUT PELVIS) 2-3V RIGHT COMPARISON:  No priors. FINDINGS: Patient is status post right hip arthroplasty. The femoral and acetabular components of the prosthesis appear well seated without definite periprosthetic fracture or other acute abnormality. Bony pelvic ring appears intact. Left proximal femur as visualized appears intact. Joint space narrowing, subchondral sclerosis, subchondral cyst formation and osteophyte formation is noted in the left hip joint, compatible with moderate to severe osteoarthritis. The in the of what appears to be a right-sided ureteral stent is noted, with the distal loop reformed in  the central pelvis, presumably within the lumen of the urinary bladder. Multiple sutures are noted in the low anatomic pelvis, mid abdomen and left side of the abdomen. IMPRESSION: 1. No acute radiographic abnormality of the bony pelvis or right hip. 2. Status post right hip arthroplasty. 3. 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 Electronically Signed   By: Vinnie Langton M.D.   On: 02/11/2021 06:36    Impression:   1.  Septic shock Indwelling right ureteral stent present since 10/03/2020 with moderate hydronephrosis/hydroureter and persistent contrast on a 4-hour delay KUB consistent with obstruction which is concerning for a source of sepsis Abdominal wall  cellulitis also a possible source  2.  Right hydronephrosis/hydroureter As above  3.  AML Patient desires to stay full code  Recommendation: Mr. Nicotra has high-grade right ureteral obstruction which is concerning as a possible source of his sepsis.  The possibility of sepsis from cellulitis has also been discussed. We discussed placement of a percutaneous nephrostomy tube by IR though with his profound thrombocytopenia unlikely this could be performed today Cystoscopy with right ureteral stent exchange was discussed.  He was informed this would require anesthesia and we discussed possible complications of CVA, MI and even death.  Other complications including ureteral injury and inability to place the stent due to complete obstruction were discussed. The option of no intervention at present with continued antibiotic therapy and close monitoring was also discussed. He indicated he wanted to discuss these options over with his wife and after that discussion has decided on cystoscopy with ureteral stent exchange.  I did speak with his wife by telephone and discussed the options and complications with her also.  All of her questions were answered.    02/11/2021, 10:57 AM  John Giovanni,  MD

## 2021-02-11 NOTE — Anesthesia Preprocedure Evaluation (Addendum)
Anesthesia Evaluation  Patient identified by MRN, date of birth, ID band Patient awake    Airway Mallampati: III  TM Distance: >3 FB     Dental  (+) Partial Upper,    Pulmonary     + wheezing      Cardiovascular +CHF  + dysrhythmias  Rhythm:Irregular     Neuro/Psych Pt s/p fall at home with contusion to scalp, no intracranial injury per CT scan.  Chronic mild T1 compression fracture CVA    GI/Hepatic   Endo/Other  Hypothyroidism   Renal/GU    Kidney stones; ureteral stent in place with several stones and hydronephrosis Pt with fever and sepsis perhaps related to kidney infection vs. Soft tissue infection of decubitus ulcers etc.    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   Anesthesia Other Findings   Reproductive/Obstetrics                          Anesthesia Physical Anesthesia Plan  ASA: 4  Anesthesia Plan: MAC   Post-op Pain Management:    Induction:   PONV Risk Score and Plan: 2 and Ondansetron  Airway Management Planned: Simple Face Mask  Additional Equipment:   Intra-op Plan:   Post-operative Plan:   Informed Consent: I have reviewed the patients History and Physical, chart, labs and discussed the procedure including the risks, benefits and alternatives for the proposed anesthesia with the patient or authorized representative who has indicated his/her understanding and acceptance.       Plan Discussed with:   Anesthesia Plan Comments: (MAC vs. IVGA discussed with patient.  Plan to avoid intubation due to respiratory status.)       Anesthesia Quick Evaluation

## 2021-02-11 NOTE — ED Notes (Signed)
Blood bank called to notify that there are no platelets in house as what they had expired at midnight. They have requested platelets from [facility] that should arrive around 8:30am.

## 2021-02-11 NOTE — H&P (Addendum)
CRITICAL CARE H&P    Name: Gordon Farrell MRN: [id number] DOB: 02-Mar-1946     LOS: 0   SUBJECTIVE FINDINGS & SIGNIFICANT EVENTS    Patient description:  This is a pleasant 75 year old male has a history of recurrent DVTs, chronic atrial fibrillation with systolic CHF, previous CVA pulmonary embolism, history of sleep apnea Crohn's disease, recurrent vomiting, history of recurrent nephrolithiasis, history of ventral hernia, obesity, BPH, chronic vitamin B6 deficiency, chronic pancytopenia seen by oncology Dr. 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  Followed up with oncology at [facility].  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  He finished 5 cycles of azacitidine-venetoclax.  He received transfusion with 2 units of PRBCs 1 week prior to admission.  Plan was to keep him on posaconazole until [place] is more stable.  Discussed goals of care with patient he wishes to continue to stay full code with maximal aggression of medical therapy.  He has a painful and tender erythematous rash across abdomen, traumatized bruising over the right greater trochanter, and a similar painful/tender erythematous swelling over the right occiput.  He does admit to fall with landing on right side.  Also has stage II ulcerated skin breakdown over the sacrum and intergluteal cleft.  PCCM asked for admission due to septic shock requiring Levophed in the context of pancytopenia with possible abdominal wall cellulitis.  Lines/tubes :   Microbiology/Sepsis markers: Results for orders placed or performed during the hospital encounter of 02/11/21  Resp Panel by RT-PCR (Flu A&B, Covid) Nasopharyngeal Swab     Status: None   Collection Time: 02/11/21   5:13 AM   Specimen: Nasopharyngeal Swab; Nasopharyngeal(NP) swabs in vial transport medium  Result Value Ref Range Status   SARS Coronavirus 2 by RT PCR NEGATIVE NEGATIVE Final    Comment: (NOTE) SARS-CoV-2 target nucleic acids are NOT DETECTED.  The SARS-CoV-2 RNA is generally detectable in upper respiratory specimens during the acute phase of infection. The lowest concentration of SARS-CoV-2 viral copies this assay can detect is 138 copies/mL. A negative result does not preclude SARS-Cov-2 infection and should not be used as the sole basis for treatment or other patient management decisions. A negative result may occur with  improper specimen collection/handling, submission of specimen other than nasopharyngeal swab, presence of viral mutation(s) within the areas targeted by this assay, and inadequate number of viral copies(<138 copies/mL). A negative result must be combined with clinical observations, patient history, and epidemiological information. The expected result is Negative.  Fact Sheet for Patients:  EntrepreneurPulse.com.au  Fact Sheet for Healthcare Providers:  IncredibleEmployment.be  This test is no t yet approved or cleared by the [place] FDA and  has been authorized for detection and/or diagnosis of SARS-CoV-2 by FDA under an Emergency Use Authorization (EUA). This EUA will remain  in effect (meaning this test can be used) for the duration of the COVID-19 declaration under Section 564(b)(1) of the Act, 21 U.S.C.section 360bbb-3(b)(1), unless the authorization is terminated  or revoked sooner.       Influenza A by PCR NEGATIVE NEGATIVE Final   Influenza B by PCR NEGATIVE NEGATIVE Final    Comment: (NOTE) The Xpert Xpress SARS-CoV-2/FLU/RSV plus assay is intended as an aid in the diagnosis of influenza from Nasopharyngeal swab specimens and should not be used as a sole basis for treatment. Nasal washings and aspirates  are unacceptable for Xpert Xpress  SARS-CoV-2/FLU/RSV testing.  Fact Sheet for Patients: EntrepreneurPulse.com.au  Fact Sheet for Healthcare Providers: IncredibleEmployment.be  This test is not yet approved or cleared by the [place] FDA and has been authorized for detection and/or diagnosis of SARS-CoV-2 by FDA under an Emergency Use Authorization (EUA). This EUA will remain in effect (meaning this test can be used) for the duration of the COVID-19 declaration under Section 564(b)(1) of the Act, 21 U.S.C. section 360bbb-3(b)(1), unless the authorization is terminated or revoked.  Performed at [facility], [address] [place], [place] [address]     Anti-infectives:  Anti-infectives (From admission, onward)    Start     Dose/Rate Route Frequency Ordered Stop   02/11/21 0545  ceFEPIme (MAXIPIME) 1 g in sodium chloride 0.9 % 100 mL IVPB        1 g 200 mL/hr over 30 Minutes Intravenous  Once 02/11/21 0542 02/11/21 0722   02/11/21 0545  vancomycin (VANCOCIN) IVPB 1000 mg/200 mL premix        1,000 mg 200 mL/hr over 60 Minutes Intravenous  Once 02/11/21 0542 02/11/21 0815        Consults:   Neurology and heme-onc   PAST MEDICAL HISTORY   Past Medical History:  Diagnosis Date   Anemia    Dysrhythmia    Hypothyroidism      SURGICAL HISTORY   Past Surgical History:  Procedure Laterality Date   BACK SURGERY     CHOLECYSTECTOMY     COLON RESECTION     multiple times   LEFT HEART CATH AND CORONARY ANGIOGRAPHY N/A 10/23/2020   Procedure: LEFT HEART CATH AND CORONARY ANGIOGRAPHY with coronary intervention;  Surgeon: Dionisio David, MD;  Location: [place] [facility];  Service: Cardiovascular;  Laterality: N/A;   TOTAL HIP ARTHROPLASTY  05/06/2010     FAMILY HISTORY   Family History  Problem Relation Age of Onset   Osteoporosis Mother      SOCIAL HISTORY   Social History   Tobacco Use   Smoking  status: Never   Smokeless tobacco: Never  Vaping Use   Vaping Use: Never used  Substance Use Topics   Alcohol use: No    Alcohol/week: 0.0 standard drinks   Drug use: Never     MEDICATIONS   Current Medication:  Current Facility-Administered Medications:    0.9 %  sodium chloride infusion, 10 mL/hr, Intravenous, Once, Alfred Levins, [place], MD   lactated ringers infusion, , Intravenous, Continuous, Alfred Levins, [place], MD, Paused at 02/11/21 6629   norepinephrine (LEVOPHED) 12m in 2547mpremix infusion, 0-40 mcg/min, Intravenous, Continuous, VeAlfred LevinsCa[place]MD, Last Rate: 15 mL/hr at 02/11/21 0807, 4 mcg/min at 02/11/21 0807  Current Outpatient Medications:    apixaban (ELIQUIS) 2.5 MG TABS tablet, Take 1 tablet (2.5 mg total) by mouth 2 (two) times daily., Disp: 60 tablet, Rfl:    atorvastatin (LIPITOR) 40 MG tablet, Take 40 mg by mouth daily., Disp: , Rfl:    finasteride (PROSCAR) 5 MG tablet, Take 5 mg by mouth daily., Disp: , Rfl:    gabapentin (NEURONTIN) 100 MG capsule, Take 100 mg by mouth 3 (three) times daily., Disp: , Rfl:    latanoprost (XALATAN) 0.005 % ophthalmic solution, Place 1 drop into both eyes at bedtime., Disp: , Rfl:    levothyroxine (SYNTHROID) 100 MCG tablet, Take 100 mcg by mouth daily., Disp: , Rfl:    metoprolol tartrate (LOPRESSOR) 25 MG tablet, Take 1 tablet (25 mg total) by mouth 2 (two) times daily.,  Disp: 60 tablet, Rfl: 0   pantoprazole (PROTONIX) 40 MG tablet, Take 1 tablet (40 mg total) by mouth daily., Disp: 30 tablet, Rfl: 0   pilocarpine (SALAGEN) 5 MG tablet, Take 5 mg by mouth in the morning, at noon, and at bedtime., Disp: , Rfl:    pyridoxine (B-6) 100 MG tablet, Take 100 mg by mouth daily., Disp: , Rfl:    sertraline (ZOLOFT) 100 MG tablet, Take 100 mg by mouth daily., Disp: , Rfl:    timolol (TIMOPTIC) 0.5 % ophthalmic solution, Place 1 drop into the right eye 2 (two) times daily., Disp: , Rfl:    traZODone (DESYREL) 100 MG tablet, Take 100  mg by mouth at bedtime., Disp: , Rfl:     ALLERGIES   Patient has no known allergies.    REVIEW OF SYSTEMS    10 point review of systems conducted is negative except for pain over the right hip and abdominal area as well as the occiput  PHYSICAL EXAMINATION   Vital Signs: Temp:  [98.6 F (37 C)-103 F (39.4 C)] 98.6 F (37 C) (10/09 0759) Pulse Rate:  [89-117] 107 (10/09 0759) Resp:  [12-19] 19 (10/09 0759) BP: (82-104)/(45-61) 89/55 (10/09 0759) SpO2:  [91 %-99 %] 97 % (10/09 0759) Weight:  [85 kg-85.3 kg] 85.3 kg (10/09 0536)  GENERAL: Age-appropriate mild distress due to pain HEAD: Normocephalic, atraumatic.  EYES: Pupils equal, round, reactive to light.  No scleral icterus.  MOUTH: Moist mucosal membrane. NECK: Supple. No thyromegaly. No nodules. No JVD.  PULMONARY: Mild rhonchi bilaterally CARDIOVASCULAR: S1 and S2.  Irregular rate and rhythm. No murmurs, rubs, or gallops.  Atrial fibrillation noted GASTROINTESTINAL: Soft, nontender, non-distended. No masses. Positive bowel sounds. No hepatosplenomegaly.  Urinary collection system in place externally MUSCULOSKELETAL: No swelling, clubbing, or edema.  NEUROLOGIC: Mild distress due to acute illness SKIN:intact,warm,dry  Sacral pressure wound down to anal area stage 2-3 present on admission.  Additional integumentary findings as below.  Media Information Document Information  Photos    02/11/2021 08:26  Attached To:  Hospital Encounter on 02/11/21   Source Information  Ottie Glazier, MD  [facility]   Media Information Document Information  Photos    02/11/2021 08:27  Attached To:  Hospital Encounter on 02/11/21   Source Information  Ottie Glazier, MD  [facility]    PERTINENT DATA     Infusions:  sodium chloride     lactated ringers Stopped (02/11/21 0728)   norepinephrine (LEVOPHED) Adult infusion 4 mcg/min (02/11/21 0807)   Scheduled Medications:  PRN  Medications:  Hemodynamic parameters:   Intake/Output: 10/08 0701 - 10/09 0700 In: 2000 [IV Piggyback:2000] Out: -   Ventilator  Settings:  LAB RESULTS:  Basic Metabolic Panel: Recent Labs  Lab 02/08/21 1526 02/11/21 0513  NA 135 136  K 3.4* 3.5  CL 105 103  CO2 23 24  GLUCOSE 119* 123*  BUN 23 29*  CREATININE 1.15 1.36*  CALCIUM 8.1* 8.3*   Liver Function Tests: Recent Labs  Lab 02/11/21 0513  AST 36  ALT 25  ALKPHOS 133*  BILITOT 2.8*  PROT 5.3*  ALBUMIN 2.5*   No results for input(s): LIPASE, AMYLASE in the last 168 hours. No results for input(s): AMMONIA in the last 168 hours. CBC: Recent Labs  Lab 02/08/21 1526 02/11/21 0513  WBC 0.2* 0.6*  NEUTROABS  --  0.5*  HGB 8.1* 8.0*  HCT 22.9* 22.4*  MCV 91.6 91.4  PLT 10* 8*   Cardiac  Enzymes: No results for input(s): CKTOTAL, CKMB, CKMBINDEX, TROPONINI in the last 168 hours. BNP: Invalid input(s): POCBNP CBG: No results for input(s): GLUCAP in the last 168 hours.     IMAGING RESULTS:  Imaging: DG Shoulder Right  Result Date: 02/11/2021 CLINICAL DATA:  75 year old male with history of trauma from a fall out of bed this morning. Right shoulder pain. EXAM: RIGHT SHOULDER - 2+ VIEW COMPARISON:  No priors. FINDINGS: Three views of the right shoulder demonstrate no acute displaced fracture or dislocation. Humeral head is high-riding, suggesting rotator cuff pathology. There is joint space narrowing, subchondral sclerosis, subchondral cyst formation and osteophyte formation in the glenohumeral and acromioclavicular joints, compatible with osteoarthritis. Numerous surgical clips project over the right hemithorax. IMPRESSION: 1. No acute radiographic abnormality of the right shoulder. 2. Acromioclavicular and glenohumeral joint osteoarthritis with high-riding humeral head suggesting chronic rotator cuff disease. Electronically Signed   By: Vinnie Langton M.D.   On: 02/11/2021 06:37   CT HEAD WO CONTRAST  (5MM)  Result Date: 02/11/2021 CLINICAL DATA:  75 year old male fell out of bed. Right side pain. Leukemia, on chemotherapy. EXAM: CT HEAD WITHOUT CONTRAST TECHNIQUE: Contiguous axial images were obtained from the base of the skull through the vertex without intravenous contrast. COMPARISON:  Recent head CT 02/08/2021. FINDINGS: Brain: Stable cerebral volume. No midline shift, ventriculomegaly, mass effect, evidence of mass lesion, intracranial hemorrhage or evidence of cortically based acute infarction. Gray-white matter differentiation remains normal for age. Vascular: Mild Calcified atherosclerosis at the skull base. No suspicious intracranial vascular hyperdensity. Skull: No fracture or suspicious osseous lesion identified. Sinuses/Orbits: Visualized paranasal sinuses and mastoids are stable and well aerated. Other: Right lateral convexity scalp hematoma (series 3, image 62). Underlying calvarium intact. Other orbit and scalp soft tissues appears stable. IMPRESSION: 1. Right side scalp hematoma without underlying skull fracture. 2. Stable and negative for age non contrast CT appearance of the brain. Electronically Signed   By: Genevie Ann M.D.   On: 02/11/2021 06:55   CT Cervical Spine Wo Contrast  Result Date: 02/11/2021 CLINICAL DATA:  75 year old male fell out of bed. Right side pain. Leukemia, on chemotherapy. EXAM: CT CERVICAL SPINE WITHOUT CONTRAST TECHNIQUE: Multidetector CT imaging of the cervical spine was performed without intravenous contrast. Multiplanar CT image reconstructions were also generated. COMPARISON:  Recent CT cervical spine 02/08/2021. CT head today. Chest CT 10/26/2020. FINDINGS: Alignment: Stable cervical lordosis. Cervicothoracic junction alignment is within normal limits. Bilateral posterior element alignment is within normal limits. Skull base and vertebrae: Osteopenia. Visualized skull base is intact. No atlanto-occipital dissociation. C1 and C2 appear intact and aligned. No  acute osseous abnormality identified. Soft tissues and spinal canal: No prevertebral fluid or swelling. No visible canal hematoma. Negative noncontrast visible neck soft tissues. Disc levels: Cervical spine degeneration is mild for age and stable. Upper chest: Mild chronic compression fractures of T1 and T3 are stable since June. Layering right pleural effusion in the apex seems to have intermediate fluid density. Otherwise negative lung apices, visible superior mediastinum. IMPRESSION: 1. No acute traumatic injury identified in the cervical spine. 2. Persistent layering pleural effusion in the right lung apex, possibly exudate of. 3. Mild chronic T1 and T3 compression fractures. Electronically Signed   By: Genevie Ann M.D.   On: 02/11/2021 06:59   CT ABDOMEN PELVIS W CONTRAST  Result Date: 02/11/2021 CLINICAL DATA:  75 year old male with history of trauma after falling out of bed this morning. EXAM: CT ABDOMEN AND PELVIS WITH CONTRAST TECHNIQUE: Multidetector  CT imaging of the abdomen and pelvis was performed using the standard protocol following bolus administration of intravenous contrast. CONTRAST:  58m OMNIPAQUE IOHEXOL 350 MG/ML SOLN COMPARISON:  CT the abdomen and pelvis 10/27/2020. FINDINGS: Lower chest: Small to moderate right and trace left pleural effusions with areas of passive subsegmental atelectasis in the lower lobes of the lungs bilaterally. Hepatobiliary: No suspicious cystic or solid hepatic lesions. No intra or extrahepatic biliary ductal dilatation. Status post cholecystectomy. Pancreas: No pancreatic mass. No pancreatic ductal dilatation. No pancreatic or peripancreatic fluid collections or inflammatory changes. Spleen: Unremarkable. Adrenals/Urinary Tract: Nonobstructive calculi are present within the collecting systems of both kidneys measuring up to 9 mm in the upper pole collecting system of the right kidney and 1 cm in the lower pole collecting system of left kidney. Right-sided double-J  ureteral stent in position with proximal loop reformed in the right renal pelvis and distal loop reformed in the region of the urinary bladder. Along the course of the right ureter there are 2 small calculi, largest of which is in the mid ureter (coronal image 47 of series 5 and axial image 60 of series 2) measuring 5 mm. Smaller 3 mm distal ureteral calculus also noted on axial image 67 of series 2. There is moderate right hydroureteronephrosis with extensive periureteric soft tissue stranding. No calculi are noted along the course of the left ureter or within the lumen of the urinary bladder. In the lower pole of the left kidney there is a 1.2 cm exophytic lesion anteriorly, compatible with a simple cyst. No suspicious renal lesions. Bilateral adrenal glands are normal in appearance. Urinary bladder is nearly decompressed, but otherwise unremarkable in appearance. Stomach/Bowel: The appearance of the stomach is normal. There is no pathologic dilatation of small bowel or colon. Normal appendix. Vascular/Lymphatic: Aortic atherosclerosis, without evidence of aneurysm or dissection in the abdominal or pelvic vasculature. No lymphadenopathy noted in the abdomen or pelvis. Reproductive: Prostate gland and seminal vesicles are unremarkable in appearance. Other: No significant volume of ascites.  No pneumoperitoneum. Musculoskeletal: Chronic appearing T12 compression fracture with 20% loss of anterior vertebral body height. There are no aggressive appearing lytic or blastic lesions noted in the visualized portions of the skeleton. Status post right hip arthroplasty. Multifocal edema in the subcutaneous fat of the anterior abdominal wall. IMPRESSION: 1. There is what appears to be mild edema in the subcutaneous fat of the anterior abdominal wall, which could correlate to areas of soft tissue contusion. No other signs of significant acute traumatic injury are noted elsewhere in the abdomen or pelvis. 2. Right-sided  double-J ureteral stent in position with 2 stones along the course of the right ureter and moderate right hydroureteronephrosis which has slightly increased compared to the prior examination, as above. Multiple additional nonobstructive calculi are noted in the collecting systems of both kidneys measuring up to 1 cm in the lower pole collecting system of left kidney. 3. Small to moderate right and trace left pleural effusions with areas of passive subsegmental atelectasis in the lower lobes of the lungs bilaterally. 4. Aortic atherosclerosis. 5. Additional incidental findings, as above. Electronically Signed   By: DVinnie LangtonM.D.   On: 02/11/2021 06:54   DG Chest Portable 1 View  Result Date: 02/11/2021 CLINICAL DATA:  75year old male with history of fever. EXAM: PORTABLE CHEST 1 VIEW COMPARISON:  Chest x-ray 10/26/2020. FINDINGS: Lung volumes are normal. Mild diffuse peribronchial cuffing. No consolidative airspace disease. No pleural effusions. No pneumothorax. No pulmonary nodule or  mass noted. Pulmonary vasculature and the cardiomediastinal silhouette are within normal limits. Numerous small surgical clips project over the right hemithorax. IMPRESSION: 1. Mild diffuse peribronchial cuffing which could suggest an acute bronchitis. Electronically Signed   By: Vinnie Langton M.D.   On: 02/11/2021 06:34   DG Hip Unilat W or Wo Pelvis 2-3 Views Right  Result Date: 02/11/2021 CLINICAL DATA:  75 year old male with history of trauma from a fall out of bed. Right hip pain. EXAM: DG HIP (WITH OR WITHOUT PELVIS) 2-3V RIGHT COMPARISON:  No priors. FINDINGS: Patient is status post right hip arthroplasty. The femoral and acetabular components of the prosthesis appear well seated without definite periprosthetic fracture or other acute abnormality. Bony pelvic ring appears intact. Left proximal femur as visualized appears intact. Joint space narrowing, subchondral sclerosis, subchondral cyst formation and  osteophyte formation is noted in the left hip joint, compatible with moderate to severe osteoarthritis. The in the of what appears to be a right-sided ureteral stent is noted, with the distal loop reformed in the central pelvis, presumably within the lumen of the urinary bladder. Multiple sutures are noted in the low anatomic pelvis, mid abdomen and left side of the abdomen. IMPRESSION: 1. No acute radiographic abnormality of the bony pelvis or right hip. 2. Status post right hip arthroplasty. 3. 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 Electronically Signed   By: Vinnie Langton M.D.   On: 02/11/2021 06:36   '@PROBHOSP' @ DG Shoulder Right  Result Date: 02/11/2021 CLINICAL DATA:  75 year old male with history of trauma from a fall out of bed this morning. Right shoulder pain. EXAM: RIGHT SHOULDER - 2+ VIEW COMPARISON:  No priors. FINDINGS: Three views of the right shoulder demonstrate no acute displaced fracture or dislocation. Humeral head is high-riding, suggesting rotator cuff pathology. There is joint space narrowing, subchondral sclerosis, subchondral cyst formation and osteophyte formation in the glenohumeral and acromioclavicular joints, compatible with osteoarthritis. Numerous surgical clips project over the right hemithorax. IMPRESSION: 1. No acute radiographic abnormality of the right shoulder. 2. Acromioclavicular and glenohumeral joint osteoarthritis with high-riding humeral head suggesting chronic rotator cuff disease. Electronically Signed   By: Vinnie Langton M.D.   On: 02/11/2021 06:37   CT HEAD WO CONTRAST (5MM)  Result Date: 02/11/2021 CLINICAL DATA:  75 year old male fell out of bed. Right side pain. Leukemia, on chemotherapy. EXAM: CT HEAD WITHOUT CONTRAST TECHNIQUE: Contiguous axial images were obtained from the base of the skull through the vertex without intravenous contrast. COMPARISON:  Recent head CT 02/08/2021. FINDINGS: Brain: Stable cerebral volume. No midline shift,  ventriculomegaly, mass effect, evidence of mass lesion, intracranial hemorrhage or evidence of cortically based acute infarction. Gray-white matter differentiation remains normal for age. Vascular: Mild Calcified atherosclerosis at the skull base. No suspicious intracranial vascular hyperdensity. Skull: No fracture or suspicious osseous lesion identified. Sinuses/Orbits: Visualized paranasal sinuses and mastoids are stable and well aerated. Other: Right lateral convexity scalp hematoma (series 3, image 62). Underlying calvarium intact. Other orbit and scalp soft tissues appears stable. IMPRESSION: 1. Right side scalp hematoma without underlying skull fracture. 2. Stable and negative for age non contrast CT appearance of the brain. Electronically Signed   By: Genevie Ann M.D.   On: 02/11/2021 06:55   CT Cervical Spine Wo Contrast  Result Date: 02/11/2021 CLINICAL DATA:  75 year old male fell out of bed. Right side pain. Leukemia, on chemotherapy. EXAM: CT CERVICAL SPINE WITHOUT CONTRAST TECHNIQUE: Multidetector CT imaging of the cervical spine was performed without intravenous contrast.  Multiplanar CT image reconstructions were also generated. COMPARISON:  Recent CT cervical spine 02/08/2021. CT head today. Chest CT 10/26/2020. FINDINGS: Alignment: Stable cervical lordosis. Cervicothoracic junction alignment is within normal limits. Bilateral posterior element alignment is within normal limits. Skull base and vertebrae: Osteopenia. Visualized skull base is intact. No atlanto-occipital dissociation. C1 and C2 appear intact and aligned. No acute osseous abnormality identified. Soft tissues and spinal canal: No prevertebral fluid or swelling. No visible canal hematoma. Negative noncontrast visible neck soft tissues. Disc levels: Cervical spine degeneration is mild for age and stable. Upper chest: Mild chronic compression fractures of T1 and T3 are stable since June. Layering right pleural effusion in the apex seems to  have intermediate fluid density. Otherwise negative lung apices, visible superior mediastinum. IMPRESSION: 1. No acute traumatic injury identified in the cervical spine. 2. Persistent layering pleural effusion in the right lung apex, possibly exudate of. 3. Mild chronic T1 and T3 compression fractures. Electronically Signed   By: Genevie Ann M.D.   On: 02/11/2021 06:59   CT ABDOMEN PELVIS W CONTRAST  Result Date: 02/11/2021 CLINICAL DATA:  75 year old male with history of trauma after falling out of bed this morning. EXAM: CT ABDOMEN AND PELVIS WITH CONTRAST TECHNIQUE: Multidetector CT imaging of the abdomen and pelvis was performed using the standard protocol following bolus administration of intravenous contrast. CONTRAST:  31m OMNIPAQUE IOHEXOL 350 MG/ML SOLN COMPARISON:  CT the abdomen and pelvis 10/27/2020. FINDINGS: Lower chest: Small to moderate right and trace left pleural effusions with areas of passive subsegmental atelectasis in the lower lobes of the lungs bilaterally. Hepatobiliary: No suspicious cystic or solid hepatic lesions. No intra or extrahepatic biliary ductal dilatation. Status post cholecystectomy. Pancreas: No pancreatic mass. No pancreatic ductal dilatation. No pancreatic or peripancreatic fluid collections or inflammatory changes. Spleen: Unremarkable. Adrenals/Urinary Tract: Nonobstructive calculi are present within the collecting systems of both kidneys measuring up to 9 mm in the upper pole collecting system of the right kidney and 1 cm in the lower pole collecting system of left kidney. Right-sided double-J ureteral stent in position with proximal loop reformed in the right renal pelvis and distal loop reformed in the region of the urinary bladder. Along the course of the right ureter there are 2 small calculi, largest of which is in the mid ureter (coronal image 47 of series 5 and axial image 60 of series 2) measuring 5 mm. Smaller 3 mm distal ureteral calculus also noted on axial  image 67 of series 2. There is moderate right hydroureteronephrosis with extensive periureteric soft tissue stranding. No calculi are noted along the course of the left ureter or within the lumen of the urinary bladder. In the lower pole of the left kidney there is a 1.2 cm exophytic lesion anteriorly, compatible with a simple cyst. No suspicious renal lesions. Bilateral adrenal glands are normal in appearance. Urinary bladder is nearly decompressed, but otherwise unremarkable in appearance. Stomach/Bowel: The appearance of the stomach is normal. There is no pathologic dilatation of small bowel or colon. Normal appendix. Vascular/Lymphatic: Aortic atherosclerosis, without evidence of aneurysm or dissection in the abdominal or pelvic vasculature. No lymphadenopathy noted in the abdomen or pelvis. Reproductive: Prostate gland and seminal vesicles are unremarkable in appearance. Other: No significant volume of ascites.  No pneumoperitoneum. Musculoskeletal: Chronic appearing T12 compression fracture with 20% loss of anterior vertebral body height. There are no aggressive appearing lytic or blastic lesions noted in the visualized portions of the skeleton. Status post right hip arthroplasty. Multifocal  edema in the subcutaneous fat of the anterior abdominal wall. IMPRESSION: 1. There is what appears to be mild edema in the subcutaneous fat of the anterior abdominal wall, which could correlate to areas of soft tissue contusion. No other signs of significant acute traumatic injury are noted elsewhere in the abdomen or pelvis. 2. Right-sided double-J ureteral stent in position with 2 stones along the course of the right ureter and moderate right hydroureteronephrosis which has slightly increased compared to the prior examination, as above. Multiple additional nonobstructive calculi are noted in the collecting systems of both kidneys measuring up to 1 cm in the lower pole collecting system of left kidney. 3. Small to  moderate right and trace left pleural effusions with areas of passive subsegmental atelectasis in the lower lobes of the lungs bilaterally. 4. Aortic atherosclerosis. 5. Additional incidental findings, as above. Electronically Signed   By: Vinnie Langton M.D.   On: 02/11/2021 06:54   DG Chest Portable 1 View  Result Date: 02/11/2021 CLINICAL DATA:  75 year old male with history of fever. EXAM: PORTABLE CHEST 1 VIEW COMPARISON:  Chest x-ray 10/26/2020. FINDINGS: Lung volumes are normal. Mild diffuse peribronchial cuffing. No consolidative airspace disease. No pleural effusions. No pneumothorax. No pulmonary nodule or mass noted. Pulmonary vasculature and the cardiomediastinal silhouette are within normal limits. Numerous small surgical clips project over the right hemithorax. IMPRESSION: 1. Mild diffuse peribronchial cuffing which could suggest an acute bronchitis. Electronically Signed   By: Vinnie Langton M.D.   On: 02/11/2021 06:34   DG Hip Unilat W or Wo Pelvis 2-3 Views Right  Result Date: 02/11/2021 CLINICAL DATA:  75 year old male with history of trauma from a fall out of bed. Right hip pain. EXAM: DG HIP (WITH OR WITHOUT PELVIS) 2-3V RIGHT COMPARISON:  No priors. FINDINGS: Patient is status post right hip arthroplasty. The femoral and acetabular components of the prosthesis appear well seated without definite periprosthetic fracture or other acute abnormality. Bony pelvic ring appears intact. Left proximal femur as visualized appears intact. Joint space narrowing, subchondral sclerosis, subchondral cyst formation and osteophyte formation is noted in the left hip joint, compatible with moderate to severe osteoarthritis. The in the of what appears to be a right-sided ureteral stent is noted, with the distal loop reformed in the central pelvis, presumably within the lumen of the urinary bladder. Multiple sutures are noted in the low anatomic pelvis, mid abdomen and left side of the abdomen.  IMPRESSION: 1. No acute radiographic abnormality of the bony pelvis or right hip. 2. Status post right hip arthroplasty. 3. 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 Electronically Signed   By: Vinnie Langton M.D.   On: 02/11/2021 06:36        ASSESSMENT AND PLAN    -Multidisciplinary rounds held today     Septic shock Present on admission - presumably due to abdominal wall cellulitis           - PCR - VZV sterile swab culture           - anaerobic and aerobic bacterial swab culture -empirically on Cefepime and Vancomycin -follow ABG and LA - lactate trend is incremented upwards -PCR mrsa nasal -Procalcitonin trend -IVF resuscitative to judicious use with history of CHF and currently with 3+ lower extremity edema and bilateral pleural effusions worse on the right -follow up cultures -Have not delivered Solu-Cortef at this time will consider if necessary   Recurrent nephrolithiasis Status post ureteral stent Urology is on case-appreciate input -status post  CT imaging: Right-sided double-J ureteral stent in position with 2 stones along the course of the right ureter and moderate right hydroureteronephrosis which has slightly increased compared to the prior examination, as above. Multiple additional nonobstructive calculi are noted in the collecting systems of both kidneys measuring up to 1 cm in the lower pole collecting system of left kidney.    Acute Kidney Injury KDIGO 2 with hydronephrosis - reviewed with Urologist - plan for intervention post medical stabilization with pancytopenia and septic shock -Nephrolithiasis and ureteral stone placed with previous severe hydronephrosis few months back.      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 Irradiated blood products are preferred, but in case of urgent transfusion needs non-irradiated blood products may be used:  -Granix initiated for severe leukopenia until further recommendations by medical oncologist   Bilateral pleural effusions with compressive atelectasis worse at the right lower lobe      -Judicious use of fluid infusions       -Monitor for respiratory distress and oxygen requirement   -Supplemental O2 as needed   -Bronchopulmonary hygiene for atelectatic changes and recruitment maneuvers -may utilize MetaNeb therapy with albuterol     Severe protein calorie malnutrition -Bitemporal and peripheral muscle wasting Hypoalbuminemia with third spacing on examination Monitor for refeeding syndrome -RD nutritional evaluation     Chronic pancytopenia    -Severe thrombocytopenia-emergent platelet pheresis -use irradiated blood products    -Granix subcu daily -Medical oncology consultation for further recommendations    ID -continue IV abx as prescibed -follow up cultures    GI/Nutrition GI PROPHYLAXIS as indicated DIET-->TF's as tolerated Constipation protocol as indicated    ENDO - ICU hypoglycemic\Hyperglycemia protocol -check FSBS per protocol   ELECTROLYTES -follow labs as needed -replace as needed -pharmacy consultation   DVT/GI PRX ordered -SCDs  TRANSFUSIONS AS NEEDED MONITOR FSBS ASSESS the  need for LABS as needed   Critical care provider statement:   Total critical care time: 109 minutes   Performed by: AleLanney Gins   Critical care time was exclusive of separately billable procedures and treating other patients.   Critical care was necessary to treat or prevent imminent or life-threatening deterioration.   Critical care was time spent personally by me on the following activities: development of treatment plan with patient and/or surrogate as well as nursing, discussions with consultants, evaluation of patient's response to treatment, examination of patient, obtaining history from patient or surrogate, ordering and performing treatments and interventions, ordering and review of laboratory studies, ordering and review of radiographic studies, pulse oximetry and re-evaluation of patient's condition.     This document was prepared using Dragon voice recognition software and may include unintentional dictation errors.    FuaOttie Glazier.D.  [facility]

## 2021-02-11 NOTE — Consult Note (Signed)
[place]  [phone] [phone]  ID: Gordon Farrell OB: [birth year]  MR#: [id number]  ZTI#:[id number]  Patient Care Team: Danae Orleans, MD as PCP - General (Internal Medicine)  CHIEF COMPLAINT: Septic shock/hydronephrosis  HPI: Gordon Farrell is a 75 year old male who presented to [facility] ED yesterday for a fall.  Work-up included a CT abdomen/pelvis which shows moderate right hydronephrosis hydroureter and indwelling right ureteral stent.  While in the ED he was hypotensive without response to fluid resuscitation.  He is on Levophed.  KUB was consistent with obstruction concerning for source of sepsis versus abdominal wall cellulitis.  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  There is also possibility for cystoscopy with right ureteral stent exchange.  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  He finished 5 cycles of azacitidine venetoclax.  He receives packed red blood cells as needed.  Last was about a week ago.  He underwent cystoscopy with exchange of ureteral stent with Dr. Bernardo Heater..  Tolerated procedure well.  He is still in ICU.  Labs from this morning show a hemoglobin of 6.1 and a platelet count of 15,000.  Interval history-patient is being transferred to [facility] under the care of Dr. Gloriann Loan with [facility] oncology. He is feeling ok. Has hip pain. Can eat and drink.   REVIEW OF SYSTEMS:   Review of Systems  Constitutional: Negative.  Negative for chills, fever, malaise/fatigue and weight loss.  HENT:  Negative for congestion, ear pain and tinnitus.   Eyes: Negative.  Negative for blurred vision and double vision.  Respiratory:  Positive for shortness of breath. Negative for cough and sputum production.   Cardiovascular:  Negative.  Negative for chest pain, palpitations and leg swelling.  Gastrointestinal: Negative.  Negative for abdominal pain, constipation, diarrhea, nausea and vomiting.  Genitourinary:  Negative for dysuria, frequency and urgency.  Musculoskeletal:  Positive for falls and joint pain. Negative for back pain.  Skin: Negative.  Negative for rash.  Neurological: Negative.  Negative for weakness and headaches.  Endo/Heme/Allergies: Negative.  Does not bruise/bleed easily.  Psychiatric/Behavioral: Negative.  Negative for depression. The patient is not nervous/anxious and does not have insomnia.    As per HPI. Otherwise, a complete review of systems is negative.  PAST MEDICAL HISTORY: Past Medical History:  Diagnosis Date   Anemia    Dysrhythmia    Hypothyroidism     PAST SURGICAL HISTORY: Past Surgical History:  Procedure Laterality Date   BACK SURGERY     CHOLECYSTECTOMY     COLON RESECTION     multiple times   LEFT HEART CATH AND CORONARY ANGIOGRAPHY N/A 10/23/2020   Procedure: LEFT HEART CATH AND CORONARY ANGIOGRAPHY with coronary intervention;  Surgeon: Dionisio David, MD;  Location: [place] [facility];  Service: Cardiovascular;  Laterality: N/A;   TOTAL HIP ARTHROPLASTY  05/06/2010    FAMILY HISTORY: Family History  Problem Relation Age of Onset   Osteoporosis Mother     ADVANCED DIRECTIVES (Y/N):  @ADVDIR @  HEALTH MAINTENANCE: Social History   Tobacco Use   Smoking status: Never   Smokeless tobacco: Never  Vaping Use   Vaping Use: Never used  Substance Use Topics   Alcohol use: No    Alcohol/week: 0.0 standard drinks   Drug use:  Never     Colonoscopy:  PAP:  Bone density:  Lipid panel:  No Known Allergies  Current Facility-Administered Medications  Medication Dose Route Frequency Provider Last Rate Last Admin   0.9 %  sodium chloride infusion  10 mL/hr Intravenous Once Alfred Levins, [place], MD       acetaminophen (TYLENOL) 325 MG tablet             acetaminophen (TYLENOL) tablet 650 mg  650 mg Oral Q4H PRN Ottie Glazier, MD   650 mg at 02/11/21 1427   acyclovir (ZOVIRAX) 855 mg in dextrose 5 % 250 mL IVPB  10 mg/kg Intravenous Q8H Nazari, Walid A, RPH 267.1 mL/hr at 02/11/21 1313 855 mg at 02/11/21 1313   ceFEPIme (MAXIPIME) 2 g in sodium chloride 0.9 % 100 mL IVPB  2 g Intravenous Q12H Shanlever, Charles M, RPH       Chlorhexidine Gluconate Cloth 2 % PADS 6 each  6 each Topical Q0600 Ottie Glazier, MD   6 each at 02/11/21 1034   [START ON 02/12/2021] fluconazole (DIFLUCAN) IVPB 400 mg  400 mg Intravenous Q24H Ottie Glazier, MD       lactated ringers infusion   Intravenous Continuous Alfred Levins, [place], MD 150 mL/hr at 02/11/21 0828 New Bag at 02/11/21 0828   morphine 2 MG/ML injection 1 mg  1 mg Intravenous Q3H PRN Ottie Glazier, MD   1 mg at 02/11/21 1310   morphine 2 MG/ML injection            norepinephrine (LEVOPHED) 4mg  in 28mL premix infusion  0-40 mcg/min Intravenous Continuous Rudene Re, MD 15 mL/hr at 02/11/21 0807 4 mcg/min at 02/11/21 0807   pantoprazole (PROTONIX) injection 40 mg  40 mg Intravenous QHS Ottie Glazier, MD       polyethylene glycol (MIRALAX / GLYCOLAX) packet 17 g  17 g Oral Daily PRN Ottie Glazier, MD       [START ON 02/12/2021] vancomycin (VANCOREADY) IVPB 1500 mg/300 mL  1,500 mg Intravenous Q24H Lu Duffel, [place]        OBJECTIVE: Vitals:   02/11/21 1500 02/11/21 1508  BP: (!) 111/48 (!) 111/48  Pulse: 85 87  Resp: (!) 23 (!) 29  Temp: 100.2 F (37.9 C) 100.2 F (37.9 C)  SpO2: 92% 93%     Body mass index is 28.98 kg/m.    ECOG FS:4 - Bedbound  Physical Exam Constitutional:      Appearance: Normal appearance.  HENT:     Head: Normocephalic and atraumatic.  Eyes:     Pupils: Pupils are equal, round, and reactive to light.  Cardiovascular:     Rate and Rhythm: Normal rate and regular rhythm.     Heart sounds: Normal heart sounds. No murmur heard. Pulmonary:      Effort: Pulmonary effort is normal.     Breath sounds: Normal breath sounds. No wheezing.  Abdominal:     General: Bowel sounds are normal. There is no distension.     Palpations: Abdomen is soft.     Tenderness: There is no abdominal tenderness.  Musculoskeletal:        General: Normal range of motion.     Cervical back: Normal range of motion.  Skin:    General: Skin is warm and dry.     Findings: No rash.  Neurological:     Mental Status: He is alert and oriented to person, place, and time.  Psychiatric:        Judgment: Judgment normal.  LAB RESULTS:  Lab Results  Component Value Date   NA 136 02/11/2021   K 3.5 02/11/2021   CL 103 02/11/2021   CO2 24 02/11/2021   GLUCOSE 123 (H) 02/11/2021   BUN 29 (H) 02/11/2021   CREATININE 1.36 (H) 02/11/2021   CALCIUM 8.3 (L) 02/11/2021   PROT 5.3 (L) 02/11/2021   ALBUMIN 2.5 (L) 02/11/2021   AST 36 02/11/2021   ALT 25 02/11/2021   ALKPHOS 133 (H) 02/11/2021   BILITOT 2.8 (H) 02/11/2021   GFRNONAA 54 (L) 02/11/2021    Lab Results  Component Value Date   WBC 0.6 (LL) 02/11/2021   NEUTROABS 0.5 (L) 02/11/2021   HGB 8.0 (L) 02/11/2021   HCT 22.4 (L) 02/11/2021   MCV 91.4 02/11/2021   PLT 22 (LL) 02/11/2021     STUDIES: DG Lumbar Spine 2-3 Views  Result Date: 02/08/2021 CLINICAL DATA:  Fall today.  Low back pain.  History of leukemia. EXAM: LUMBAR SPINE - 2-3 VIEW COMPARISON:  Lumbar spine MRI 10/26/2020. CT abdomen and pelvis 10/27/2020. FINDINGS: There are 5 non rib-bearing lumbar type vertebrae. Trace anterolisthesis of L5 on S1 is unchanged. A mild chronic T12 superior endplate compression deformity is unchanged. No acute fracture is identified. There is unchanged mild disc space narrowing at L5-S1. Lower lumbar facet arthrosis is asymmetrically severe on the right at L5-S1. A right-sided ureteral stent and bilateral renal calculi are partially visualized. Prior right hip arthroplasty is noted. IMPRESSION: No acute  osseous abnormality identified. Electronically Signed   By: Logan Bores M.D.   On: 02/08/2021 18:47   DG Shoulder Right  Result Date: 02/11/2021 CLINICAL DATA:  76 year old male with history of trauma from a fall out of bed this morning. Right shoulder pain. EXAM: RIGHT SHOULDER - 2+ VIEW COMPARISON:  No priors. FINDINGS: Three views of the right shoulder demonstrate no acute displaced fracture or dislocation. Humeral head is high-riding, suggesting rotator cuff pathology. There is joint space narrowing, subchondral sclerosis, subchondral cyst formation and osteophyte formation in the glenohumeral and acromioclavicular joints, compatible with osteoarthritis. Numerous surgical clips project over the right hemithorax. IMPRESSION: 1. No acute radiographic abnormality of the right shoulder. 2. Acromioclavicular and glenohumeral joint osteoarthritis with high-riding humeral head suggesting chronic rotator cuff disease. Electronically Signed   By: Vinnie Langton M.D.   On: 02/11/2021 06:37   CT HEAD WO CONTRAST (5MM)  Result Date: 02/11/2021 CLINICAL DATA:  75 year old male fell out of bed. Right side pain. Leukemia, on chemotherapy. EXAM: CT HEAD WITHOUT CONTRAST TECHNIQUE: Contiguous axial images were obtained from the base of the skull through the vertex without intravenous contrast. COMPARISON:  Recent head CT 02/08/2021. FINDINGS: Brain: Stable cerebral volume. No midline shift, ventriculomegaly, mass effect, evidence of mass lesion, intracranial hemorrhage or evidence of cortically based acute infarction. Gray-white matter differentiation remains normal for age. Vascular: Mild Calcified atherosclerosis at the skull base. No suspicious intracranial vascular hyperdensity. Skull: No fracture or suspicious osseous lesion identified. Sinuses/Orbits: Visualized paranasal sinuses and mastoids are stable and well aerated. Other: Right lateral convexity scalp hematoma (series 3, image 62). Underlying calvarium  intact. Other orbit and scalp soft tissues appears stable. IMPRESSION: 1. Right side scalp hematoma without underlying skull fracture. 2. Stable and negative for age non contrast CT appearance of the brain. Electronically Signed   By: Genevie Ann M.D.   On: 02/11/2021 06:55   CT Head Wo Contrast  Result Date: 02/08/2021 CLINICAL DATA:  Fall EXAM: CT HEAD WITHOUT CONTRAST TECHNIQUE: Contiguous  axial images were obtained from the base of the skull through the vertex without intravenous contrast. COMPARISON:  Head CT dated October 26, 2020 FINDINGS: Brain: Chronic white matter ischemic change. Mild generalized atrophy, likely age related. No evidence of acute infarction, hemorrhage, hydrocephalus, extra-axial collection or mass lesion/mass effect. Vascular: No hyperdense vessel or unexpected calcification. Skull: Normal. Negative for fracture or focal lesion. Sinuses/Orbits: No acute finding. Other: None. IMPRESSION: No acute intracranial abnormality. Electronically Signed   By: Yetta Glassman M.D.   On: 02/08/2021 16:23   CT Cervical Spine Wo Contrast  Result Date: 02/11/2021 CLINICAL DATA:  75 year old male fell out of bed. Right side pain. Leukemia, on chemotherapy. EXAM: CT CERVICAL SPINE WITHOUT CONTRAST TECHNIQUE: Multidetector CT imaging of the cervical spine was performed without intravenous contrast. Multiplanar CT image reconstructions were also generated. COMPARISON:  Recent CT cervical spine 02/08/2021. CT head today. Chest CT 10/26/2020. FINDINGS: Alignment: Stable cervical lordosis. Cervicothoracic junction alignment is within normal limits. Bilateral posterior element alignment is within normal limits. Skull base and vertebrae: Osteopenia. Visualized skull base is intact. No atlanto-occipital dissociation. C1 and C2 appear intact and aligned. No acute osseous abnormality identified. Soft tissues and spinal canal: No prevertebral fluid or swelling. No visible canal hematoma. Negative noncontrast  visible neck soft tissues. Disc levels: Cervical spine degeneration is mild for age and stable. Upper chest: Mild chronic compression fractures of T1 and T3 are stable since June. Layering right pleural effusion in the apex seems to have intermediate fluid density. Otherwise negative lung apices, visible superior mediastinum. IMPRESSION: 1. No acute traumatic injury identified in the cervical spine. 2. Persistent layering pleural effusion in the right lung apex, possibly exudate of. 3. Mild chronic T1 and T3 compression fractures. Electronically Signed   By: Genevie Ann M.D.   On: 02/11/2021 06:59   CT Cervical Spine Wo Contrast  Result Date: 02/08/2021 CLINICAL DATA:  Fall EXAM: CT CERVICAL SPINE WITHOUT CONTRAST TECHNIQUE: Multidetector CT imaging of the cervical spine was performed without intravenous contrast. Multiplanar CT image reconstructions were also generated. COMPARISON:  CT cervical spine dated October 26, 2020 FINDINGS: Alignment: Normal. Skull base and vertebrae: No acute fracture. No primary bone lesion or focal pathologic process. Soft tissues and spinal canal: No prevertebral fluid or swelling. No visible canal hematoma. Disc levels: Multilevel degenerative disc disease, most pronounced at C6-C7. Mild facet arthropathy. Upper chest: Partially visualized right pleural effusion. Other: None. IMPRESSION: No CT evidence of acute cervical spine injury. Partially visualized right pleural effusion. Electronically Signed   By: Yetta Glassman M.D.   On: 02/08/2021 16:28   CT ABDOMEN PELVIS W CONTRAST  Result Date: 02/11/2021 CLINICAL DATA:  75 year old male with history of trauma after falling out of bed this morning. EXAM: CT ABDOMEN AND PELVIS WITH CONTRAST TECHNIQUE: Multidetector CT imaging of the abdomen and pelvis was performed using the standard protocol following bolus administration of intravenous contrast. CONTRAST:  74mL OMNIPAQUE IOHEXOL 350 MG/ML SOLN COMPARISON:  CT the abdomen and pelvis  10/27/2020. FINDINGS: Lower chest: Small to moderate right and trace left pleural effusions with areas of passive subsegmental atelectasis in the lower lobes of the lungs bilaterally. Hepatobiliary: No suspicious cystic or solid hepatic lesions. No intra or extrahepatic biliary ductal dilatation. Status post cholecystectomy. Pancreas: No pancreatic mass. No pancreatic ductal dilatation. No pancreatic or peripancreatic fluid collections or inflammatory changes. Spleen: Unremarkable. Adrenals/Urinary Tract: Nonobstructive calculi are present within the collecting systems of both kidneys measuring up to 9 mm in the upper  pole collecting system of the right kidney and 1 cm in the lower pole collecting system of left kidney. Right-sided double-J ureteral stent in position with proximal loop reformed in the right renal pelvis and distal loop reformed in the region of the urinary bladder. Along the course of the right ureter there are 2 small calculi, largest of which is in the mid ureter (coronal image 47 of series 5 and axial image 60 of series 2) measuring 5 mm. Smaller 3 mm distal ureteral calculus also noted on axial image 67 of series 2. There is moderate right hydroureteronephrosis with extensive periureteric soft tissue stranding. No calculi are noted along the course of the left ureter or within the lumen of the urinary bladder. In the lower pole of the left kidney there is a 1.2 cm exophytic lesion anteriorly, compatible with a simple cyst. No suspicious renal lesions. Bilateral adrenal glands are normal in appearance. Urinary bladder is nearly decompressed, but otherwise unremarkable in appearance. Stomach/Bowel: The appearance of the stomach is normal. There is no pathologic dilatation of small bowel or colon. Normal appendix. Vascular/Lymphatic: Aortic atherosclerosis, without evidence of aneurysm or dissection in the abdominal or pelvic vasculature. No lymphadenopathy noted in the abdomen or pelvis.  Reproductive: Prostate gland and seminal vesicles are unremarkable in appearance. Other: No significant volume of ascites.  No pneumoperitoneum. Musculoskeletal: Chronic appearing T12 compression fracture with 20% loss of anterior vertebral body height. There are no aggressive appearing lytic or blastic lesions noted in the visualized portions of the skeleton. Status post right hip arthroplasty. Multifocal edema in the subcutaneous fat of the anterior abdominal wall. IMPRESSION: 1. There is what appears to be mild edema in the subcutaneous fat of the anterior abdominal wall, which could correlate to areas of soft tissue contusion. No other signs of significant acute traumatic injury are noted elsewhere in the abdomen or pelvis. 2. Right-sided double-J ureteral stent in position with 2 stones along the course of the right ureter and moderate right hydroureteronephrosis which has slightly increased compared to the prior examination, as above. Multiple additional nonobstructive calculi are noted in the collecting systems of both kidneys measuring up to 1 cm in the lower pole collecting system of left kidney. 3. Small to moderate right and trace left pleural effusions with areas of passive subsegmental atelectasis in the lower lobes of the lungs bilaterally. 4. Aortic atherosclerosis. 5. Additional incidental findings, as above. Electronically Signed   By: Vinnie Langton M.D.   On: 02/11/2021 06:54   DG Chest Portable 1 View  Result Date: 02/11/2021 CLINICAL DATA:  75 year old male with history of fever. EXAM: PORTABLE CHEST 1 VIEW COMPARISON:  Chest x-ray 10/26/2020. FINDINGS: Lung volumes are normal. Mild diffuse peribronchial cuffing. No consolidative airspace disease. No pleural effusions. No pneumothorax. No pulmonary nodule or mass noted. Pulmonary vasculature and the cardiomediastinal silhouette are within normal limits. Numerous small surgical clips project over the right hemithorax. IMPRESSION: 1. Mild  diffuse peribronchial cuffing which could suggest an acute bronchitis. Electronically Signed   By: Vinnie Langton M.D.   On: 02/11/2021 06:34   DG Abd Portable 1V  Result Date: 02/11/2021 CLINICAL DATA:  Follow-up post CT contrast to check for right stent placement. EXAM: PORTABLE ABDOMEN - 1 VIEW COMPARISON:  CT abdomen pelvis February 11, 2021 FINDINGS: Right double pigtail ureteral catheter is identified in good position. Moderate right hydronephrosis is noted. IMPRESSION: Right double pigtail ureteral catheter is identified in good position. Electronically Signed   By: Abelardo Diesel  M.D.   On: 02/11/2021 10:38   DG Hip Unilat W or Wo Pelvis 2-3 Views Right  Result Date: 02/11/2021 CLINICAL DATA:  75 year old male with history of trauma from a fall out of bed. Right hip pain. EXAM: DG HIP (WITH OR WITHOUT PELVIS) 2-3V RIGHT COMPARISON:  No priors. FINDINGS: Patient is status post right hip arthroplasty. The femoral and acetabular components of the prosthesis appear well seated without definite periprosthetic fracture or other acute abnormality. Bony pelvic ring appears intact. Left proximal femur as visualized appears intact. Joint space narrowing, subchondral sclerosis, subchondral cyst formation and osteophyte formation is noted in the left hip joint, compatible with moderate to severe osteoarthritis. The in the of what appears to be a right-sided ureteral stent is noted, with the distal loop reformed in the central pelvis, presumably within the lumen of the urinary bladder. Multiple sutures are noted in the low anatomic pelvis, mid abdomen and left side of the abdomen. IMPRESSION: 1. No acute radiographic abnormality of the bony pelvis or right hip. 2. Status post right hip arthroplasty. 3. 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 Electronically Signed   By: Vinnie Langton M.D.   On: 02/11/2021 06:36    ASSESSMENT: Gordon Farrell is a 75 year old patient who was admitted to ICU for septic  shock secondary to hydronephrosis.  Had ureteral stent replaced yesterday.  PLAN: Patient is currently being treated for AML.  He is status post 5 cycles of azacitidine and venetoclax.  Plan is to transfer him to [facility] for further treatment.  Labs from this morning show white blood cell count of 800 and platelet count of 15,000.  Creatinine is 1.34 with a BUN of 31.  Patient requires irradiated blood products.  Hemoglobin is 6.1.  Patient may need a unit of blood and platelets prior to transfer.  Recommend 1 unit PRBC to maintain hemoglobin greater than 7.  Recommend platelets to maintain a count greater than 10,000. He will continue treatment with [facility].   I spent 35 minutes dedicated to the care of this patient (face-to-face and non-face-to-face) on the date of the encounter to include what is described in the assessment and plan.  Patient expressed understanding and was in agreement with this plan. He also understands that He can call clinic at any time with any questions, concerns, or complaints.   Cancer Staging No matching staging information was found for the patient.  Jacquelin Hawking, NP   02/11/2021 3:17 PM

## 2021-02-11 NOTE — Progress Notes (Signed)
Returns from or in stable condition

## 2021-02-11 NOTE — ED Provider Notes (Signed)
[facility] Emergency Department Provider Note  ____________________________________________  Time seen: Approximately 5:56 AM  I have reviewed the triage vital signs and the nursing notes.   HISTORY  Chief Complaint Fall  Level 5 caveat:  Portions of the history and physical were unable to be obtained due to confusion   HPI Gordon Farrell is a 75 y.o. 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  Patient reports that he slid out of the bed this morning and is complaining of right shoulder and hip pain.  Patient has cellulitis of the abdominal wall.  He states that he was receiving chemotherapy in his abdomen.  Review of epic shows that patient's last chemo was on 10/5.  He also has a fever of 103F.  Patient denies headache, chest pain or abdominal pain.  Patient is slightly confused. He is not sure what kind of cancer he has or when he last did receive any treatments.  Past Medical History:  Diagnosis Date   Anemia    Dysrhythmia    Hypothyroidism     Patient Active Problem List   Diagnosis Date Noted   Pancytopenia ([place]) 11/27/2020   CVA (cerebral vascular accident) ([place]) 10/28/2020   Symptomatic anemia 10/26/2020   Unspecified atrial fibrillation ([place]) 10/21/2020   Acute CHF (congestive heart failure) ([place]) 10/21/2020   Dysphagia 10/21/2020   Chest pain 10/20/2020   Acquired hypothyroidism 03/08/2015   Benign prostatic hyperplasia with urinary obstruction 03/08/2015   Compression fracture of lumbar vertebra ([place]) 03/08/2015   Crohn's disease of large intestine ([place]) 03/08/2015   Deep vein thrombosis (DVT) ([place]) 03/08/2015   Psychogenic cyclical vomiting 91/63/8466    Past Surgical History:  Procedure Laterality Date   BACK SURGERY     CHOLECYSTECTOMY     COLON RESECTION     multiple  times   LEFT HEART CATH AND CORONARY ANGIOGRAPHY N/A 10/23/2020   Procedure: LEFT HEART CATH AND CORONARY ANGIOGRAPHY with coronary intervention;  Surgeon: Dionisio David, MD;  Location: [facility];  Service: Cardiovascular;  Laterality: N/A;   TOTAL HIP ARTHROPLASTY  05/06/2010    Prior to Admission medications   Medication Sig Start Date End Date Taking? Authorizing Provider  apixaban (ELIQUIS) 2.5 MG TABS tablet Take 1 tablet (2.5 mg total) by mouth 2 (two) times daily. 11/04/20   Terrilee Croak, MD  atorvastatin (LIPITOR) 40 MG tablet Take 40 mg by mouth daily. 08/15/20   [provider]  finasteride (PROSCAR) 5 MG tablet Take 5 mg by mouth daily. 10/04/20   [provider]  gabapentin (NEURONTIN) 100 MG capsule Take 100 mg by mouth 3 (three) times daily. 09/28/20   [provider]  latanoprost (XALATAN) 0.005 % ophthalmic solution Place 1 drop into both eyes at bedtime.    [provider]  levothyroxine (SYNTHROID) 100 MCG tablet Take 100 mcg by mouth daily. 01/11/20 01/10/21  [provider]  metoprolol tartrate (LOPRESSOR) 25 MG tablet Take 1 tablet (25 mg total) by mouth 2 (two) times daily. 10/23/20   Sharen Hones, MD  pantoprazole (PROTONIX) 40 MG tablet Take 1 tablet (40 mg total) by mouth daily. 10/23/20 11/22/20  Sharen Hones, MD  pilocarpine (SALAGEN) 5 MG tablet Take 5 mg by mouth in the morning, at noon, and at bedtime. 08/11/20 08/11/21  [provider]  pyridoxine (B-6) 100  MG tablet Take 100 mg by mouth daily.    [provider]  sertraline (ZOLOFT) 100 MG tablet Take 100 mg by mouth daily. 05/23/20 05/23/21  [provider]  timolol (TIMOPTIC) 0.5 % ophthalmic solution Place 1 drop into the right eye 2 (two) times daily.    [provider]  traZODone (DESYREL) 100 MG tablet Take 100 mg by mouth at bedtime. 08/25/20   [provider]    Allergies Patient has no known allergies.  Family History   Problem Relation Age of Onset   Osteoporosis Mother     Social History Social History   Tobacco Use   Smoking status: Never   Smokeless tobacco: Never  Vaping Use   Vaping Use: Never used  Substance Use Topics   Alcohol use: No    Alcohol/week: 0.0 standard drinks   Drug use: Never    Review of Systems  Constitutional: Negative for fever. Eyes: Negative for visual changes. ENT: Negative for facial injury or neck injury Cardiovascular: Negative for chest injury. Respiratory: Negative for shortness of breath. Negative for chest wall injury. Gastrointestinal: Negative for abdominal pain or injury. Genitourinary: Negative for dysuria. Musculoskeletal: Negative for back injury, + R shoulder and R hip pain Skin: Negative for laceration/abrasions. Neurological: Negative for head injury.   ____________________________________________   PHYSICAL EXAM:  VITAL SIGNS: ED Triage Vitals  Enc Vitals Group     BP 02/11/21 0459 104/61     Pulse Rate 02/11/21 0459 94     Resp 02/11/21 0459 14     Temp 02/11/21 0500 (!) 103 F (39.4 C)     Temp Source 02/11/21 0459 Oral     SpO2 02/11/21 0459 98 %     Weight 02/11/21 0500 187 lb 6.3 oz (85 kg)     Height 02/11/21 0500 5\' 9"  (1.753 m)     Head Circumference --      Peak Flow --      Pain Score 02/11/21 0459 10     Pain Loc --      Pain Edu? --      Excl. in [place]? --     Full spinal precautions maintained throughout the trauma exam. Constitutional: Alert and oriented to self only. No acute distress. Does not appear intoxicated. HEENT Head: Normocephalic and atraumatic. Face: No facial bony tenderness. Stable midface Ears: No hemotympanum bilaterally. No Battle sign Eyes: No eye injury. PERRL. No raccoon eyes Nose: Nontender. No epistaxis. No rhinorrhea Mouth/Throat: Mucous membranes are moist. No oropharyngeal blood. No dental injury. Airway patent without stridor. Normal voice. Neck: no C-collar. No midline c-spine  tenderness.  Cardiovascular: Normal rate, regular rhythm. Normal and symmetric distal pulses are present in all extremities. Pulmonary/Chest: Chest wall is stable and nontender to palpation/compression. Normal respiratory effort. Breath sounds are normal. No crepitus.  Abdominal: Abdominal wall is erythematous, warm, and tender to the touch.  There is an area that has a significant swelling on the right lower quadrant of the abdominal wall.  He has diffuse tenderness to palpation of the abdomen  Musculoskeletal: Several bruises on all extremities.  Tender to palpation over the right hip and right shoulder with no obvious deformities.  Nontender with normal full range of motion in all other extremities. No thoracic or lumbar midline spinal tenderness. Pelvis is stable. Skin: Skin is warm, dry and intact. No abrasions or contutions. Psychiatric: Speech and behavior are appropriate. Neurological: Normal speech and language. Moves all extremities to command. No gross focal  neurologic deficits are appreciated.  Glascow Coma Score: 4 - Opens eyes on own 6 - Follows simple motor commands 4 - Seems confused, disoriented GCS: 14    ____________________________________________   LABS (all labs ordered are listed, but only abnormal results are displayed)  Labs Reviewed  LACTIC ACID, PLASMA - Abnormal; Notable for the following components:      Result Value   Lactic Acid, Venous 3.2 (*)    All other components within normal limits  COMPREHENSIVE METABOLIC PANEL - Abnormal; Notable for the following components:   Glucose, Bld 123 (*)    BUN 29 (*)    Creatinine, Ser 1.36 (*)    Calcium 8.3 (*)    Total Protein 5.3 (*)    Albumin 2.5 (*)    Alkaline Phosphatase 133 (*)    Total Bilirubin 2.8 (*)    GFR, Estimated 54 (*)    All other components within normal limits  CBC WITH DIFFERENTIAL/PLATELET - Abnormal; Notable for the following components:   WBC 0.6 (*)    RBC 2.45 (*)    Hemoglobin  8.0 (*)    HCT 22.4 (*)    RDW 16.6 (*)    Platelets 8 (*)    Neutro Abs 0.5 (*)    Lymphs Abs 0.1 (*)    Monocytes Absolute 0.0 (*)    All other components within normal limits  URINALYSIS, COMPLETE (UACMP) WITH MICROSCOPIC - Abnormal; Notable for the following components:   Color, Urine RED (*)    APPearance CLOUDY (*)    Glucose, UA   (*)    Value: TEST NOT REPORTED DUE TO COLOR INTERFERENCE OF URINE PIGMENT   Hgb urine dipstick   (*)    Value: TEST NOT REPORTED DUE TO COLOR INTERFERENCE OF URINE PIGMENT   Bilirubin Urine   (*)    Value: TEST NOT REPORTED DUE TO COLOR INTERFERENCE OF URINE PIGMENT   Ketones, ur   (*)    Value: TEST NOT REPORTED DUE TO COLOR INTERFERENCE OF URINE PIGMENT   Protein, ur   (*)    Value: TEST NOT REPORTED DUE TO COLOR INTERFERENCE OF URINE PIGMENT   Nitrite   (*)    Value: TEST NOT REPORTED DUE TO COLOR INTERFERENCE OF URINE PIGMENT   Leukocytes,Ua   (*)    Value: TEST NOT REPORTED DUE TO COLOR INTERFERENCE OF URINE PIGMENT   RBC / HPF >50 (*)    Bacteria, UA FEW (*)    All other components within normal limits  PROTIME-INR - Abnormal; Notable for the following components:   Prothrombin Time 15.5 (*)    All other components within normal limits  APTT - Abnormal; Notable for the following components:   aPTT 39 (*)    All other components within normal limits  RESP PANEL BY RT-PCR (FLU A&B, COVID) ARPGX2  CULTURE, BLOOD (ROUTINE X 2)  CULTURE, BLOOD (ROUTINE X 2)  URINE CULTURE  LACTIC ACID, PLASMA  PREPARE PLATELET PHERESIS   ____________________________________________  EKG  ED ECG REPORT I, Rudene Re, the attending physician, personally viewed and interpreted this ECG.  Sinus tachycardia with a rate of 109, no ST elevations or depressions.  Normal intervals and normal axis. ____________________________________________  RADIOLOGY  I have personally reviewed the images performed during this visit and I agree with the  Radiologist's read.   Interpretation by Radiologist:  DG Shoulder Right  Result Date: 02/11/2021 CLINICAL DATA:  75 year old male with history of trauma from a fall out  of bed this morning. Right shoulder pain. EXAM: RIGHT SHOULDER - 2+ VIEW COMPARISON:  No priors. FINDINGS: Three views of the right shoulder demonstrate no acute displaced fracture or dislocation. Humeral head is high-riding, suggesting rotator cuff pathology. There is joint space narrowing, subchondral sclerosis, subchondral cyst formation and osteophyte formation in the glenohumeral and acromioclavicular joints, compatible with osteoarthritis. Numerous surgical clips project over the right hemithorax. IMPRESSION: 1. No acute radiographic abnormality of the right shoulder. 2. Acromioclavicular and glenohumeral joint osteoarthritis with high-riding humeral head suggesting chronic rotator cuff disease. Electronically Signed   By: Vinnie Langton M.D.   On: 02/11/2021 06:37   CT HEAD WO CONTRAST (5MM)  Result Date: 02/11/2021 CLINICAL DATA:  75 year old male fell out of bed. Right side pain. Leukemia, on chemotherapy. EXAM: CT HEAD WITHOUT CONTRAST TECHNIQUE: Contiguous axial images were obtained from the base of the skull through the vertex without intravenous contrast. COMPARISON:  Recent head CT 02/08/2021. FINDINGS: Brain: Stable cerebral volume. No midline shift, ventriculomegaly, mass effect, evidence of mass lesion, intracranial hemorrhage or evidence of cortically based acute infarction. Gray-white matter differentiation remains normal for age. Vascular: Mild Calcified atherosclerosis at the skull base. No suspicious intracranial vascular hyperdensity. Skull: No fracture or suspicious osseous lesion identified. Sinuses/Orbits: Visualized paranasal sinuses and mastoids are stable and well aerated. Other: Right lateral convexity scalp hematoma (series 3, image 62). Underlying calvarium intact. Other orbit and scalp soft tissues appears  stable. IMPRESSION: 1. Right side scalp hematoma without underlying skull fracture. 2. Stable and negative for age non contrast CT appearance of the brain. Electronically Signed   By: Genevie Ann M.D.   On: 02/11/2021 06:55   CT Cervical Spine Wo Contrast  Result Date: 02/11/2021 CLINICAL DATA:  75 year old male fell out of bed. Right side pain. Leukemia, on chemotherapy. EXAM: CT CERVICAL SPINE WITHOUT CONTRAST TECHNIQUE: Multidetector CT imaging of the cervical spine was performed without intravenous contrast. Multiplanar CT image reconstructions were also generated. COMPARISON:  Recent CT cervical spine 02/08/2021. CT head today. Chest CT 10/26/2020. FINDINGS: Alignment: Stable cervical lordosis. Cervicothoracic junction alignment is within normal limits. Bilateral posterior element alignment is within normal limits. Skull base and vertebrae: Osteopenia. Visualized skull base is intact. No atlanto-occipital dissociation. C1 and C2 appear intact and aligned. No acute osseous abnormality identified. Soft tissues and spinal canal: No prevertebral fluid or swelling. No visible canal hematoma. Negative noncontrast visible neck soft tissues. Disc levels: Cervical spine degeneration is mild for age and stable. Upper chest: Mild chronic compression fractures of T1 and T3 are stable since June. Layering right pleural effusion in the apex seems to have intermediate fluid density. Otherwise negative lung apices, visible superior mediastinum. IMPRESSION: 1. No acute traumatic injury identified in the cervical spine. 2. Persistent layering pleural effusion in the right lung apex, possibly exudate of. 3. Mild chronic T1 and T3 compression fractures. Electronically Signed   By: Genevie Ann M.D.   On: 02/11/2021 06:59   CT ABDOMEN PELVIS W CONTRAST  Result Date: 02/11/2021 CLINICAL DATA:  75 year old male with history of trauma after falling out of bed this morning. EXAM: CT ABDOMEN AND PELVIS WITH CONTRAST TECHNIQUE:  Multidetector CT imaging of the abdomen and pelvis was performed using the standard protocol following bolus administration of intravenous contrast. CONTRAST:  57mL OMNIPAQUE IOHEXOL 350 MG/ML SOLN COMPARISON:  CT the abdomen and pelvis 10/27/2020. FINDINGS: Lower chest: Small to moderate right and trace left pleural effusions with areas of passive subsegmental atelectasis in the lower lobes  of the lungs bilaterally. Hepatobiliary: No suspicious cystic or solid hepatic lesions. No intra or extrahepatic biliary ductal dilatation. Status post cholecystectomy. Pancreas: No pancreatic mass. No pancreatic ductal dilatation. No pancreatic or peripancreatic fluid collections or inflammatory changes. Spleen: Unremarkable. Adrenals/Urinary Tract: Nonobstructive calculi are present within the collecting systems of both kidneys measuring up to 9 mm in the upper pole collecting system of the right kidney and 1 cm in the lower pole collecting system of left kidney. Right-sided double-J ureteral stent in position with proximal loop reformed in the right renal pelvis and distal loop reformed in the region of the urinary bladder. Along the course of the right ureter there are 2 small calculi, largest of which is in the mid ureter (coronal image 47 of series 5 and axial image 60 of series 2) measuring 5 mm. Smaller 3 mm distal ureteral calculus also noted on axial image 67 of series 2. There is moderate right hydroureteronephrosis with extensive periureteric soft tissue stranding. No calculi are noted along the course of the left ureter or within the lumen of the urinary bladder. In the lower pole of the left kidney there is a 1.2 cm exophytic lesion anteriorly, compatible with a simple cyst. No suspicious renal lesions. Bilateral adrenal glands are normal in appearance. Urinary bladder is nearly decompressed, but otherwise unremarkable in appearance. Stomach/Bowel: The appearance of the stomach is normal. There is no pathologic  dilatation of small bowel or colon. Normal appendix. Vascular/Lymphatic: Aortic atherosclerosis, without evidence of aneurysm or dissection in the abdominal or pelvic vasculature. No lymphadenopathy noted in the abdomen or pelvis. Reproductive: Prostate gland and seminal vesicles are unremarkable in appearance. Other: No significant volume of ascites.  No pneumoperitoneum. Musculoskeletal: Chronic appearing T12 compression fracture with 20% loss of anterior vertebral body height. There are no aggressive appearing lytic or blastic lesions noted in the visualized portions of the skeleton. Status post right hip arthroplasty. Multifocal edema in the subcutaneous fat of the anterior abdominal wall. IMPRESSION: 1. There is what appears to be mild edema in the subcutaneous fat of the anterior abdominal wall, which could correlate to areas of soft tissue contusion. No other signs of significant acute traumatic injury are noted elsewhere in the abdomen or pelvis. 2. Right-sided double-J ureteral stent in position with 2 stones along the course of the right ureter and moderate right hydroureteronephrosis which has slightly increased compared to the prior examination, as above. Multiple additional nonobstructive calculi are noted in the collecting systems of both kidneys measuring up to 1 cm in the lower pole collecting system of left kidney. 3. Small to moderate right and trace left pleural effusions with areas of passive subsegmental atelectasis in the lower lobes of the lungs bilaterally. 4. Aortic atherosclerosis. 5. Additional incidental findings, as above. Electronically Signed   By: Vinnie Langton M.D.   On: 02/11/2021 06:54   DG Chest Portable 1 View  Result Date: 02/11/2021 CLINICAL DATA:  75 year old male with history of fever. EXAM: PORTABLE CHEST 1 VIEW COMPARISON:  Chest x-ray 10/26/2020. FINDINGS: Lung volumes are normal. Mild diffuse peribronchial cuffing. No consolidative airspace disease. No pleural  effusions. No pneumothorax. No pulmonary nodule or mass noted. Pulmonary vasculature and the cardiomediastinal silhouette are within normal limits. Numerous small surgical clips project over the right hemithorax. IMPRESSION: 1. Mild diffuse peribronchial cuffing which could suggest an acute bronchitis. Electronically Signed   By: Vinnie Langton M.D.   On: 02/11/2021 06:34   DG Hip Unilat W or Wo Pelvis 2-3  Views Right  Result Date: 02/11/2021 CLINICAL DATA:  75 year old male with history of trauma from a fall out of bed. Right hip pain. EXAM: DG HIP (WITH OR WITHOUT PELVIS) 2-3V RIGHT COMPARISON:  No priors. FINDINGS: Patient is status post right hip arthroplasty. The femoral and acetabular components of the prosthesis appear well seated without definite periprosthetic fracture or other acute abnormality. Bony pelvic ring appears intact. Left proximal femur as visualized appears intact. Joint space narrowing, subchondral sclerosis, subchondral cyst formation and osteophyte formation is noted in the left hip joint, compatible with moderate to severe osteoarthritis. The in the of what appears to be a right-sided ureteral stent is noted, with the distal loop reformed in the central pelvis, presumably within the lumen of the urinary bladder. Multiple sutures are noted in the low anatomic pelvis, mid abdomen and left side of the abdomen. IMPRESSION: 1. No acute radiographic abnormality of the bony pelvis or right hip. 2. Status post right hip arthroplasty. 3. 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 Electronically Signed   By: Vinnie Langton M.D.   On: 02/11/2021 06:36     ____________________________________________   PROCEDURES  Procedure(s) performed:yes .1-3 Lead EKG Interpretation Performed by: Rudene Re, MD Authorized by: Rudene Re, MD     Interpretation: abnormal     ECG rate assessment: tachycardic     Rhythm: sinus tachycardia     Ectopy: none     Conduction:  normal     Critical Care performed: yes  CRITICAL CARE Performed by: Rudene Re  ?  Total critical care time: 45 min  Critical care time was exclusive of separately billable procedures and treating other patients.  Critical care was necessary to treat or prevent imminent or life-threatening deterioration.  Critical care was time spent personally by me on the following activities: development of treatment plan with patient and/or surrogate as well as nursing, discussions with consultants, evaluation of patient's response to treatment, examination of patient, obtaining history from patient or surrogate, ordering and performing treatments and interventions, ordering and review of laboratory studies, ordering and review of radiographic studies, pulse oximetry and re-evaluation of patient's condition.  ____________________________________________   INITIAL IMPRESSION / ASSESSMENT AND PLAN / ED COURSE  75 y.o. male with a history of acute myeloid leukemia, thrombocytopenia, neutropenia, anemia, DVT on Eliquis, CVA, CHF with preserved EF who presents for evaluation after a fall.  Patient came after slipping out of bed this morning and complaining of pain in the right shoulder and hip.  On arrival to the emergency room patient is found to have a fever of 103F.  Has obvious signs of cellulitis of the abdominal wall which is location where patient is receiving chemotherapy.  He also has a significant swollen area on the right lower quadrant abdominal wall concerning for possible abscess.  There is no crepitus or necrotic tissue concerning for necrotizing infection.  Patient also has several bruises on his extremities.  Head is otherwise atraumatic.  Patient is on blood thinners therefore CT head and cervical spine ordered.  X-ray of the shoulder and hip to rule out traumatic injury.  CT of abdomen pelvis ordered as well.  Labs consistent with neutropenia with a white count of 0.6 and absolute  neutrophil count of 0.5.  Patient also has thrombocytopenia with platelets of 8.  Review of epic shows the patient has received platelet transfusion yesterday 1 unit.  Will transfuse 3 units of platelets.  Will initiate broad-spectrum antibiotics.  Lactic's elevated at 3.2.  Will initiate IV fluid hydration per sepsis protocol.  Instead of 3 L we will give to since patient has a history of CHF.  Labs showing mild AKI.  UA with bacteria and RBCs but very limited study due to interference pigmented in the urine.  Urine was tea colored.  Imaging is pending.  Anticipate admission.   _________________________ 7:15 AM on 02/11/2021 ----------------------------------------- No traumatic injuries from the fall.  CT abdomen pelvis concerning for ureteral stone with hydronephrosis.  UA is limited due to pigmentation but does seem to have bacteria and WBCs concerning for possible alternative source of patient's infection.  After 2 L bolus patient's BP is 82/45 with a MAP of 50.  Will initiate third bolus and start patient on levophed.  Discussed with Dr. Lanney Gins for ICU admission. Discussed with Dr. Bernardo Heater from Urology who will evaluate patient in the ED and reach out to ICU MD for recommendations.       ____________________________________________  Please note:  Patient was evaluated in Emergency Department today for the symptoms described in the history of present illness. Patient was evaluated in the context of the global COVID-19 pandemic, which necessitated consideration that the patient might be at risk for infection with the SARS-CoV-2 virus that causes COVID-19. Institutional protocols and algorithms that pertain to the evaluation of patients at risk for COVID-19 are in a state of rapid change based on information released by regulatory bodies including the CDC and federal and state organizations. These policies and algorithms were followed during the patient's care in the ED.  Some ED evaluations and  interventions may be delayed as a result of limited staffing during the pandemic.   ____________________________________________   FINAL CLINICAL IMPRESSION(S) / ED DIAGNOSES   Final diagnoses:  Fall, initial encounter  Neutropenic fever ([place])  Thrombocytopenia ([place])  Sepsis with acute organ dysfunction and septic shock, due to unspecified organism, unspecified type ([place])  Ureteral stone      NEW MEDICATIONS STARTED DURING THIS VISIT:  ED Discharge Orders     None        Note:  This document was prepared using Dragon voice recognition software and may include unintentional dictation errors.    Rudene Re, MD 02/11/21 [phone]

## 2021-02-11 NOTE — Consult Note (Signed)
CODE SEPSIS - PHARMACY COMMUNICATION  **Broad Spectrum Antibiotics should be administered within 1 hour of Sepsis diagnosis**  Time Code Sepsis Called/Page Received: 0709  Antibiotics Ordered: Vancomycin/Cefepime  Time of 1st antibiotic administration: 0652  Additional action taken by pharmacy: none  If necessary, Name of Provider/Nurse Contacted: Stanley ,PharmD Clinical Pharmacist  02/11/2021  7:37 AM

## 2021-02-11 NOTE — Transfer of Care (Signed)
Immediate Anesthesia Transfer of Care Note  Patient: Gordon Farrell  Procedure(s) Performed: CYSTOSCOPY WITH STENT REPLACEMENT (Right)  Patient Location: ICU  Anesthesia Type:General  Level of Consciousness: awake  Airway & Oxygen Therapy: Patient Spontanous Breathing and Patient connected to face mask oxygen  Post-op Assessment: Report given to RN and Post -op Vital signs reviewed and stable  Post vital signs: stable  Last Vitals:  Vitals Value Taken Time  BP 116/55 02/11/21 1845  Temp    Pulse 93 02/11/21 1851  Resp 20 02/11/21 1851  SpO2 100 % 02/11/21 1851  Vitals shown include unvalidated device data.  Last Pain:  Vitals:   02/11/21 1715  TempSrc:   PainSc: 3          Complications: No notable events documented.

## 2021-02-11 NOTE — Progress Notes (Signed)
Spoke with Dorian RN re  PIV.  Pt has adequate IV access at this time for current meds and blood products. Recommend salvaging veins for potential future needs.  Dorian RN in agreement with plan.

## 2021-02-11 NOTE — Progress Notes (Signed)
[facility] following for sepsis protocol. 

## 2021-02-11 NOTE — ED Notes (Signed)
Lab called to report lactic of 2.6

## 2021-02-11 NOTE — Anesthesia Postprocedure Evaluation (Signed)
Anesthesia Post Note  Patient: Gordon Farrell  Procedure(s) Performed: CYSTOSCOPY WITH STENT REPLACEMENT (Right)  Patient location during evaluation: ICU Anesthesia Type: MAC Level of consciousness: awake and alert Pain management: pain level controlled Vital Signs Assessment: post-procedure vital signs reviewed and stable Respiratory status: spontaneous breathing Cardiovascular status: blood pressure returned to baseline (on minimal support (same as before surgery)) Anesthetic complications: no Comments: Pt tolerated procedure well.  Pt on low dose levophed, same as prior to stent exchange.  No anesthetic issues;   No notable events documented.   Last Vitals:  Vitals:   02/11/21 1630 02/11/21 1645  BP: (!) 100/42 (!) 92/54  Pulse: 96 (!) 106  Resp: (!) 22 (!) 22  Temp:  37.1 C  SpO2: 97% 96%    Last Pain:  Vitals:   02/11/21 1715  TempSrc:   PainSc: 3                  Harrie Foreman

## 2021-02-11 NOTE — Consult Note (Signed)
Pharmacy Antibiotic Note  Gordon Farrell is a 75 y.o. male admitted on 02/11/2021 with sepsis and rash (cellulitis vs fungal vs shingles).    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  He finished 5 cycles of azacitidine-venetoclax.  He received transfusion with 2 units of PRBCs 1 week prior to admission.  Plan with [facility] was to keep him on posaconazole until [place] was more stable.     Patient NPO and septic and pharmacy has been consulted for vancomcyin, cefepime, fluconazole, acyclovir dosing.  Plan: Will dose Cefepime 2g q12 h  Will dose 778m IV vancomycin now (10035malready given in ED) to complete 17507moading dose, followed by  Vancomycin 1500 mg IV Q 24 hrs.  Goal AUC 400-550. Expected AUC: 539 SCr used: 1.36  Will start acyclovir 15m67m q8h  Will give IV fluconazole 800mg30mding dose, followed by 400mg 60maily (can possibly switch back to posaconazole once pt no longer NPO)   Height: '5\' 9"'  (175.3 cm) Weight: 89 kg (196 lb 3.4 oz) IBW/kg (Calculated) : 70.7  Temp (24hrs), Avg:99.8 F (37.7 C), Min:97.8 F (36.6 C), Max:103 F (39.4 C)  Recent Labs  Lab 02/08/21 1526 02/11/21 0513 02/11/21 0754  WBC 0.2* 0.6*  --   CREATININE 1.15 1.36*  --   LATICACIDVEN  --  3.2* 2.6*    Estimated Creatinine Clearance: 51.8 mL/min (A) (by C-G formula based on SCr of 1.36 mg/dL (H)).    No Known Allergies  Antimicrobials this admission: Cefepime 10/9 > Vancomycin 10/9 > Acyclovir 10/9 > Fluconazole 10/9 >  Dose adjustments this admission: N?A  Microbiology results: 10/9 BCx: pending 10/9 UCx: pending  10/9 Wound Cx's: pending  10/9 Varicella-zoster by PCR 10/9 MRSA PCR: pending  Thank you for allowing pharmacy to be a part of this patient's care.  CharleLu DuffelmD, BCPS Clinical Pharmacist 02/11/2021 9:38 AM

## 2021-02-11 NOTE — Unmapped (Signed)
Recent:   What is the date of your last related visit?    Related acute medications Rx'd:    Home treatment tried:        Relevant:   Allergies: Patient has no known allergies.  Medications:    Health History:   Weight:       [place] [facility] patients only:  What was the date of your last cancer treatment (mm/dd/yy)?:   Was the treatment oral or infusion?:   Are you currently on TVEC (yes/no)?:

## 2021-02-11 NOTE — Unmapped (Signed)
Recent:   What is the date of your last related visit?  In er at [facility] for fall & fever 10/9 at 0400  Related acute medications Rx'd: started  Chemo therapy oral and infusions on 9/28  Home treatment tried:  none      Relevant:   Allergies: Patient has no known allergies.  Medications: na   Health History: cancer-leukemia   Weight:na      [place] [facility] patients only:  What was the date of your last cancer treatment (mm/dd/yy)?: 10/6  Was the treatment oral or infusion?:infusion  Are you currently on TVEC (yes/no)?: na    Reason for Disposition  ??? General information question, no triage required and triager able to answer question    Answer Assessment - Initial Assessment Questions  1. REASON FOR CALL or QUESTION: What is your reason for calling today? or How can I best help you? or What question do you have that I can help answer?      Patient fell this morning 10/9 at 0400 and spouse called ambulance, patient was taken to [place] er and had fever 103.0, spouse reports abdomen very red where patient receives chemo injections, spouse would like patient transferred to [place] and would like Dr. Malen Gauze to call her when clinic re-opens    Protocols used: INFORMATION ONLY CALL - NO TRIAGE-A-AH

## 2021-02-12 ENCOUNTER — Ambulatory Visit: Admit: 2021-02-12 | Discharge: 2021-03-06 | Disposition: E | Payer: MEDICARE | Source: Other Acute Inpatient Hospital

## 2021-02-12 ENCOUNTER — Encounter: Admit: 2021-02-12 | Discharge: 2021-03-06 | Disposition: E | Payer: MEDICARE | Source: Other Acute Inpatient Hospital

## 2021-02-12 ENCOUNTER — Encounter
Admit: 2021-02-12 | Discharge: 2021-03-06 | Disposition: E | Payer: MEDICARE | Source: Other Acute Inpatient Hospital | Attending: Pulmonary Disease

## 2021-02-12 ENCOUNTER — Encounter
Admit: 2021-02-12 | Discharge: 2021-03-06 | Disposition: E | Payer: MEDICARE | Source: Other Acute Inpatient Hospital | Attending: Student in an Organized Health Care Education/Training Program

## 2021-02-12 ENCOUNTER — Encounter: Payer: Self-pay | Admitting: Urology

## 2021-02-12 DIAGNOSIS — D61818 Other pancytopenia: Secondary | ICD-10-CM

## 2021-02-12 DIAGNOSIS — R5081 Fever presenting with conditions classified elsewhere: Secondary | ICD-10-CM

## 2021-02-12 DIAGNOSIS — J90 Pleural effusion, not elsewhere classified: Secondary | ICD-10-CM

## 2021-02-12 DIAGNOSIS — N134 Hydroureter: Secondary | ICD-10-CM | POA: Diagnosis not present

## 2021-02-12 DIAGNOSIS — E43 Unspecified severe protein-calorie malnutrition: Secondary | ICD-10-CM

## 2021-02-12 DIAGNOSIS — C9200 Acute myeloblastic leukemia, not having achieved remission: Secondary | ICD-10-CM

## 2021-02-12 DIAGNOSIS — A419 Sepsis, unspecified organism: Secondary | ICD-10-CM

## 2021-02-12 DIAGNOSIS — Z515 Encounter for palliative care: Secondary | ICD-10-CM | POA: Diagnosis not present

## 2021-02-12 DIAGNOSIS — R6521 Severe sepsis with septic shock: Secondary | ICD-10-CM | POA: Diagnosis not present

## 2021-02-12 DIAGNOSIS — N179 Acute kidney failure, unspecified: Secondary | ICD-10-CM | POA: Diagnosis not present

## 2021-02-12 DIAGNOSIS — N1330 Unspecified hydronephrosis: Secondary | ICD-10-CM

## 2021-02-12 DIAGNOSIS — N132 Hydronephrosis with renal and ureteral calculous obstruction: Secondary | ICD-10-CM | POA: Diagnosis not present

## 2021-02-12 DIAGNOSIS — D709 Neutropenia, unspecified: Secondary | ICD-10-CM

## 2021-02-12 LAB — BPAM PLATELET PHERESIS
Blood Product Expiration Date: 202210102359
Blood Product Expiration Date: 202210102359
Blood Product Expiration Date: 202210102359
ISSUE DATE / TIME: 202210090750
ISSUE DATE / TIME: 202210091221
ISSUE DATE / TIME: 202210091342
Unit Type and Rh: 5100
Unit Type and Rh: 5100
Unit Type and Rh: 5100

## 2021-02-12 LAB — BASIC METABOLIC PANEL
Anion gap: 7 (ref 5–15)
BUN: 31 mg/dL — ABNORMAL HIGH (ref 8–23)
CO2: 23 mmol/L (ref 22–32)
Calcium: 7.6 mg/dL — ABNORMAL LOW (ref 8.9–10.3)
Chloride: 104 mmol/L (ref 98–111)
Creatinine, Ser: 1.34 mg/dL — ABNORMAL HIGH (ref 0.61–1.24)
GFR, Estimated: 55 mL/min — ABNORMAL LOW (ref >60)
Glucose, Bld: 124 mg/dL — ABNORMAL HIGH (ref 70–99)
Potassium: 3.5 mmol/L (ref 3.5–5.1)
Sodium: 134 mmol/L — ABNORMAL LOW (ref 135–145)

## 2021-02-12 LAB — PREPARE PLATELET PHERESIS
Unit division: 0
Unit division: 0
Unit division: 0

## 2021-02-12 LAB — PROCALCITONIN: Procalcitonin: 21.81 ng/mL

## 2021-02-12 LAB — CBC
HCT: 16.5 % — ABNORMAL LOW (ref 39.0–52.0)
HEMATOCRIT: 21.7 % — ABNORMAL LOW (ref 39.0–48.0)
HEMATOCRIT: 17.8 % — ABNORMAL LOW (ref 39.0–48.0)
HEMOGLOBIN: 7.3 g/dL — ABNORMAL LOW (ref 12.9–16.5)
HEMOGLOBIN: 6.3 g/dL — ABNORMAL LOW (ref 12.9–16.5)
Hemoglobin: 6.1 g/dL — ABNORMAL LOW (ref 13.0–17.0)
MCH: 33.7 pg (ref 26.0–34.0)
MCHC: 37 g/dL — ABNORMAL HIGH (ref 30.0–36.0)
MCV: 91.2 fL (ref 80.0–100.0)
MEAN CORPUSCULAR HEMOGLOBIN CONC: 33.6 g/dL (ref 32.0–36.0)
MEAN CORPUSCULAR HEMOGLOBIN CONC: 35.5 g/dL (ref 32.0–36.0)
MEAN CORPUSCULAR HEMOGLOBIN: 30.8 pg (ref 25.9–32.4)
MEAN CORPUSCULAR HEMOGLOBIN: 31.8 pg (ref 25.9–32.4)
MEAN CORPUSCULAR VOLUME: 91.6 fL (ref 77.6–95.7)
MEAN CORPUSCULAR VOLUME: 89.5 fL (ref 77.6–95.7)
MEAN PLATELET VOLUME: 7.8 fL (ref 6.8–10.7)
MEAN PLATELET VOLUME: 7.6 fL (ref 6.8–10.7)
PLATELET COUNT: 26 10*9/L — ABNORMAL LOW (ref 150–450)
PLATELET COUNT: 16 10*9/L — ABNORMAL LOW (ref 150–450)
Platelets: 15 10*3/uL — CL (ref 150–400)
RBC: 1.81 MIL/uL — ABNORMAL LOW (ref 4.22–5.81)
RDW: 17.2 % — ABNORMAL HIGH (ref 11.5–15.5)
RED BLOOD CELL COUNT: 2.37 10*12/L — ABNORMAL LOW (ref 4.26–5.60)
RED BLOOD CELL COUNT: 1.99 10*12/L — ABNORMAL LOW (ref 4.26–5.60)
RED CELL DISTRIBUTION WIDTH: 15.9 % — ABNORMAL HIGH (ref 12.2–15.2)
RED CELL DISTRIBUTION WIDTH: 15.6 % — ABNORMAL HIGH (ref 12.2–15.2)
WBC ADJUSTED: 1 10*9/L — ABNORMAL LOW (ref 3.6–11.2)
WBC ADJUSTED: 0.6 10*9/L — ABNORMAL LOW (ref 3.6–11.2)
WBC: 0.8 10*3/uL — CL (ref 4.0–10.5)
nRBC: 0 % (ref 0.0–0.2)

## 2021-02-12 LAB — GLUCOSE, CAPILLARY
Glucose-Capillary: 100 mg/dL — ABNORMAL HIGH (ref 70–99)
Glucose-Capillary: 106 mg/dL — ABNORMAL HIGH (ref 70–99)
Glucose-Capillary: 107 mg/dL — ABNORMAL HIGH (ref 70–99)
Glucose-Capillary: 106 mg/dL — ABNORMAL HIGH (ref 70–99)

## 2021-02-12 LAB — URINE CULTURE: Culture: <10000 — AB

## 2021-02-12 LAB — PHOSPHORUS
PHOSPHORUS: 1.9 mg/dL — ABNORMAL LOW (ref 2.4–5.1)
Phosphorus: 2.3 mg/dL — ABNORMAL LOW (ref 2.5–4.6)

## 2021-02-12 LAB — HEMOGLOBIN AND HEMATOCRIT, BLOOD
HCT: 19.4 % — ABNORMAL LOW (ref 39.0–52.0)
Hemoglobin: 6.6 g/dL — ABNORMAL LOW (ref 13.0–17.0)

## 2021-02-12 LAB — PREPARE RBC (CROSSMATCH)

## 2021-02-12 LAB — LACTIC ACID, PLASMA: Lactic Acid, Venous: 2.9 mmol/L (ref 0.5–1.9)

## 2021-02-12 LAB — MAGNESIUM
MAGNESIUM: 2 mg/dL (ref 1.6–2.6)
Magnesium: 2.1 mg/dL (ref 1.7–2.4)

## 2021-02-12 LAB — COMPREHENSIVE METABOLIC PANEL
ALBUMIN: 2 g/dL — ABNORMAL LOW (ref 3.4–5.0)
ALKALINE PHOSPHATASE: 120 U/L — ABNORMAL HIGH (ref 46–116)
ALT (SGPT): 34 U/L (ref 10–49)
ANION GAP: 11 mmol/L (ref 5–14)
AST (SGOT): 55 U/L — ABNORMAL HIGH (ref <=34)
BILIRUBIN TOTAL: 2.2 mg/dL — ABNORMAL HIGH (ref 0.3–1.2)
BLOOD UREA NITROGEN: 31 mg/dL — ABNORMAL HIGH (ref 9–23)
BUN / CREAT RATIO: 24
CALCIUM: 8.1 mg/dL — ABNORMAL LOW (ref 8.7–10.4)
CHLORIDE: 101 mmol/L (ref 98–107)
CO2: 23 mmol/L (ref 20.0–31.0)
CREATININE: 1.29 mg/dL — ABNORMAL HIGH
EGFR CKD-EPI (2021) MALE: 58 mL/min/{1.73_m2} — ABNORMAL LOW (ref >=60)
GLUCOSE RANDOM: 103 mg/dL (ref 70–179)
POTASSIUM: 3.6 mmol/L (ref 3.4–4.8)
PROTEIN TOTAL: 5 g/dL — ABNORMAL LOW (ref 5.7–8.2)
SODIUM: 135 mmol/L (ref 135–145)

## 2021-02-12 LAB — ADDON DIFFERENTIAL ONLY
BASOPHILS ABSOLUTE COUNT: 0 10*9/L (ref 0.0–0.1)
BASOPHILS RELATIVE PERCENT: 0.2 %
EOSINOPHILS ABSOLUTE COUNT: 0 10*9/L (ref 0.0–0.5)
EOSINOPHILS RELATIVE PERCENT: 1 %
LYMPHOCYTES ABSOLUTE COUNT: 0.1 10*9/L — ABNORMAL LOW (ref 1.1–3.6)
LYMPHOCYTES RELATIVE PERCENT: 7.3 %
MONOCYTES ABSOLUTE COUNT: 0 10*9/L — ABNORMAL LOW (ref 0.3–0.8)
MONOCYTES RELATIVE PERCENT: 0.3 %
NEUTROPHILS ABSOLUTE COUNT: 0.9 10*9/L — ABNORMAL LOW (ref 1.8–7.8)
NEUTROPHILS RELATIVE PERCENT: 91.2 %

## 2021-02-12 LAB — URINALYSIS
BILIRUBIN UA: NEGATIVE
BILIRUBIN UA: NEGATIVE
GLUCOSE UA: NEGATIVE
GLUCOSE UA: NEGATIVE
KETONES UA: NEGATIVE
KETONES UA: NEGATIVE
LEUKOCYTE ESTERASE UA: NEGATIVE
NITRITE UA: NEGATIVE
NITRITE UA: NEGATIVE
PH UA: 5.5 (ref 5.0–9.0)
PH UA: 5.5 (ref 5.0–9.0)
PROTEIN UA: 50 — AB
PROTEIN UA: 50 — AB
RBC UA: >182 /HPF — ABNORMAL HIGH (ref <=3)
RBC UA: >182 /HPF — ABNORMAL HIGH (ref <=3)
SPECIFIC GRAVITY UA: 1.015 (ref 1.003–1.030)
SPECIFIC GRAVITY UA: 1.02 (ref 1.003–1.030)
SQUAMOUS EPITHELIAL: 4 /HPF (ref 0–5)
SQUAMOUS EPITHELIAL: <1 /HPF (ref 0–5)
UROBILINOGEN UA: <2
UROBILINOGEN UA: 2 — AB
WBC UA: 23 /HPF — ABNORMAL HIGH (ref <=2)
WBC UA: 33 /HPF — ABNORMAL HIGH (ref <=2)

## 2021-02-12 LAB — BLOOD GAS CRITICAL CARE PANEL, VENOUS
BASE EXCESS VENOUS: -1.6 (ref -2.0–2.0)
CALCIUM IONIZED VENOUS (MG/DL): 4.31 mg/dL — ABNORMAL LOW (ref 4.40–5.40)
GLUCOSE WHOLE BLOOD: 95 mg/dL (ref 70–179)
HCO3 VENOUS: 22 mmol/L (ref 22–27)
HEMOGLOBIN BLOOD GAS: 6.9 g/dL — ABNORMAL LOW
LACTATE BLOOD VENOUS: 3.2 mmol/L — ABNORMAL HIGH (ref 0.5–1.8)
O2 SATURATION VENOUS: 72.4 % (ref 40.0–85.0)
PCO2 VENOUS: 33 mmHg — ABNORMAL LOW (ref 40–60)
PH VENOUS: 7.44 — ABNORMAL HIGH (ref 7.32–7.43)
PO2 VENOUS: 35 mmHg (ref 30–55)
POTASSIUM WHOLE BLOOD: 3.6 mmol/L (ref 3.4–4.6)
SODIUM WHOLE BLOOD: 135 mmol/L (ref 135–145)

## 2021-02-12 LAB — PROTIME-INR
INR: 1.63
PROTIME: 18.8 s — ABNORMAL HIGH (ref 9.8–12.8)

## 2021-02-12 LAB — SLIDE REVIEW

## 2021-02-12 LAB — LACTATE, VENOUS, WHOLE BLOOD: LACTATE BLOOD VENOUS: 4 mmol/L — ABNORMAL HIGH (ref 0.5–1.8)

## 2021-02-12 LAB — BLOOD GAS, VENOUS
BASE EXCESS VENOUS: -0.9 (ref -2.0–2.0)
HCO3 VENOUS: 23 mmol/L (ref 22–27)
O2 SATURATION VENOUS: 22.1 % — ABNORMAL LOW (ref 40.0–85.0)
PCO2 VENOUS: 38 mmHg — ABNORMAL LOW (ref 40–60)
PH VENOUS: 7.4 (ref 7.32–7.43)
PO2 VENOUS: <20 mmHg — ABNORMAL LOW (ref 30–55)

## 2021-02-12 LAB — CK: CREATINE KINASE TOTAL: 124 U/L

## 2021-02-12 LAB — HIGH SENSITIVITY TROPONIN I - SINGLE
HIGH SENSITIVITY TROPONIN I: 38 ng/L (ref <=53)
HIGH SENSITIVITY TROPONIN I: 43 ng/L (ref <=53)

## 2021-02-12 LAB — FIBRINOGEN: FIBRINOGEN LEVEL: 766 mg/dL — ABNORMAL HIGH (ref 175–500)

## 2021-02-12 MED ORDER — FUROSEMIDE 10 MG/ML IJ SOLN
40.0000 mg | Freq: Once | INTRAMUSCULAR | Status: AC
  Administered 2021-02-12: 40 mg via INTRAVENOUS
  Filled 2021-02-12: qty 4

## 2021-02-12 MED ORDER — SODIUM CHLORIDE 0.9% IV SOLUTION: Freq: Once | INTRAVENOUS | Status: DC

## 2021-02-12 MED ORDER — MORPHINE SULFATE (PF) 2 MG/ML IV SOLN: 2.0000 mg | INTRAVENOUS | Status: DC | PRN

## 2021-02-12 MED ORDER — SODIUM CHLORIDE 0.9 % IV SOLN: 2.0000 g | Freq: Two times a day (BID) | INTRAVENOUS | Status: AC

## 2021-02-12 MED ORDER — MORPHINE SULFATE (PF) 2 MG/ML IV SOLN
2.0000 mg | INTRAVENOUS | Status: DC | PRN
  Administered 2021-02-12 (×2): 2 mg via INTRAVENOUS
  Filled 2021-02-12 (×2): qty 1

## 2021-02-12 MED ORDER — NOREPINEPHRINE 4 MG/250ML-% IV SOLN: 0.0000 ug/min | INTRAVENOUS | Status: AC

## 2021-02-12 MED ADMIN — pravastatin (PRAVACHOL) tablet 40 mg: 40 mg | ORAL | @ 23:00:00

## 2021-02-12 MED ADMIN — cefepime (MAXIPIME) 2 g in sodium chloride 0.9 % (NS) 100 mL IVPB-connector bag: 2 g | INTRAVENOUS | @ 23:00:00 | Stop: 2021-02-16

## 2021-02-12 MED ADMIN — sertraline (ZOLOFT) tablet 100 mg: 100 mg | ORAL | @ 23:00:00

## 2021-02-12 MED ADMIN — finasteride (PROSCAR) tablet 5 mg: 5 mg | ORAL | @ 23:00:00

## 2021-02-12 MED ADMIN — oxyCODONE (ROXICODONE) immediate release tablet 5 mg: 5 mg | ORAL | @ 23:00:00 | Stop: 2021-02-15

## 2021-02-12 NOTE — Progress Notes (Signed)
Pt transferred to [facility] via [facility]. All belongings sent with patient. Wife aware.

## 2021-02-12 NOTE — Consult Note (Addendum)
[place]  Telephone:([phone] [phone]   Name: Squire Withey Date: 02/12/2021 MRN: [id number]  DOB: [birth year]  Patient Care Team: Danae Orleans, MD as PCP - General (Internal Medicine)    REASON FOR CONSULTATION: Gordon Farrell is a 75 y.o. 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  Patient was admitted to the ICU on 02/11/2021 with sepsis due to infected ureteral stent.  He is currently being transferred to [facility] for further management.  Palliative care was consulted help address goals.  SOCIAL HISTORY:     reports that he has never smoked. He has never used smokeless tobacco. He reports that he does not drink alcohol and does not use drugs.  Patient is married and lives at home with his wife.  ADVANCE DIRECTIVES:  None on file  CODE STATUS: Full code  PAST MEDICAL HISTORY: Past Medical History:  Diagnosis Date   Anemia    Dysrhythmia    Hypothyroidism     PAST SURGICAL HISTORY:  Past Surgical History:  Procedure Laterality Date   BACK SURGERY     CHOLECYSTECTOMY     COLON RESECTION     multiple times   CYSTOSCOPY W/ URETERAL STENT PLACEMENT Right 02/11/2021   Procedure: CYSTOSCOPY WITH STENT REPLACEMENT;  Surgeon: Abbie Sons, MD;  Location: [facility] ORS;  Service: Urology;  Laterality: Right;   LEFT HEART CATH AND CORONARY ANGIOGRAPHY N/A 10/23/2020   Procedure: LEFT HEART CATH AND CORONARY ANGIOGRAPHY with coronary intervention;  Surgeon: Dionisio David, MD;  Location: [facility];  Service: Cardiovascular;  Laterality: N/A;   TOTAL HIP ARTHROPLASTY  05/06/2010    HEMATOLOGY/ONCOLOGY HISTORY:  Oncology History   No history exists.    ALLERGIES:  has No Known Allergies.  MEDICATIONS:  Current Facility-Administered  Medications  Medication Dose Route Frequency Provider Last Rate Last Admin   0.9 %  sodium chloride infusion (Manually program via Guardrails IV Fluids)   Intravenous Once Graves, Raeford Razor, NP       0.9 %  sodium chloride infusion  10 mL/hr Intravenous Once Alfred Levins, [place], MD       acetaminophen (TYLENOL) tablet 650 mg  650 mg Oral Q4H PRN Ottie Glazier, MD   650 mg at 02/11/21 1427   ceFEPIme (MAXIPIME) 2 g in sodium chloride 0.9 % 100 mL IVPB  2 g Intravenous Q12H Lu Duffel, RPH 200 mL/hr at 02/12/21 1043 2 g at 02/12/21 1043   Chlorhexidine Gluconate Cloth 2 % PADS 6 each  6 each Topical Q0600 Ottie Glazier, MD   6 each at 02/12/21 0839   fluconazole (DIFLUCAN) IVPB 400 mg  400 mg Intravenous Q24H Ottie Glazier, MD 100 mL/hr at 02/12/21 0839 400 mg at 02/12/21 0839   morphine 2 MG/ML injection 2 mg  2 mg Intravenous Q1H PRN Flora Lipps, MD   2 mg at 02/12/21 1207   norepinephrine (LEVOPHED) 57m in 2582mpremix infusion  0-40 mcg/min Intravenous Continuous VeRudene ReMD   Stopped at 02/11/21 2001   pantoprazole (PROTONIX) injection 40 mg  40 mg Intravenous QHS AlOttie GlazierMD   40 mg at 02/11/21 2214   polyethylene glycol (MIRALAX / GLYCOLAX) packet 17 g  17 g Oral Daily PRN AlOttie GlazierMD        VITAL SIGNS: BP (!Marland Kitchen  113/43   Pulse 91   Temp 98.4 F (36.9 C) (Oral)   Resp 20   Ht '5\' 9"'  (1.753 m)   Wt 204 lb 2.3 oz (92.6 kg)   SpO2 97%   BMI 30.15 kg/m  Filed Weights   02/11/21 0536 02/11/21 0927 02/12/21 0500  Weight: 188 lb (85.3 kg) 196 lb 3.4 oz (89 kg) 204 lb 2.3 oz (92.6 kg)    Estimated body mass index is 30.15 kg/m as calculated from the following:   Height as of this encounter: '5\' 9"'  (1.753 m).   Weight as of this encounter: 204 lb 2.3 oz (92.6 kg).  LABS: CBC:    Component Value Date/Time   WBC 0.8 (LL) 02/12/2021 0540   HGB 6.6 (L) 02/12/2021 1136   HCT 19.4 (L) 02/12/2021 1136   PLT 15 (LL) 02/12/2021 0540   MCV 91.2  02/12/2021 0540   NEUTROABS 0.5 (L) 02/11/2021 0513   LYMPHSABS 0.1 (L) 02/11/2021 0513   MONOABS 0.0 (L) 02/11/2021 0513   EOSABS 0.0 02/11/2021 0513   BASOSABS 0.0 02/11/2021 0513   Comprehensive Metabolic Panel:    Component Value Date/Time   NA 134 (L) 02/12/2021 0540   K 3.5 02/12/2021 0540   CL 104 02/12/2021 0540   CO2 23 02/12/2021 0540   BUN 31 (H) 02/12/2021 0540   BUN 9 09/05/2014 0000   CREATININE 1.34 (H) 02/12/2021 0540   GLUCOSE 124 (H) 02/12/2021 0540   CALCIUM 7.6 (L) 02/12/2021 0540   AST 36 02/11/2021 0513   ALT 25 02/11/2021 0513   ALKPHOS 133 (H) 02/11/2021 0513   BILITOT 2.8 (H) 02/11/2021 0513   PROT 5.3 (L) 02/11/2021 0513   ALBUMIN 2.5 (L) 02/11/2021 0513    RADIOGRAPHIC STUDIES: DG Lumbar Spine 2-3 Views  Result Date: 02/08/2021 CLINICAL DATA:  Fall today.  Low back pain.  History of leukemia. EXAM: LUMBAR SPINE - 2-3 VIEW COMPARISON:  Lumbar spine MRI 10/26/2020. CT abdomen and pelvis 10/27/2020. FINDINGS: There are 5 non rib-bearing lumbar type vertebrae. Trace anterolisthesis of L5 on S1 is unchanged. A mild chronic T12 superior endplate compression deformity is unchanged. No acute fracture is identified. There is unchanged mild disc space narrowing at L5-S1. Lower lumbar facet arthrosis is asymmetrically severe on the right at L5-S1. A right-sided ureteral stent and bilateral renal calculi are partially visualized. Prior right hip arthroplasty is noted. IMPRESSION: No acute osseous abnormality identified. Electronically Signed   By: Logan Bores M.D.   On: 02/08/2021 18:47   DG Shoulder Right  Result Date: 02/11/2021 CLINICAL DATA:  75 year old male with history of trauma from a fall out of bed this morning. Right shoulder pain. EXAM: RIGHT SHOULDER - 2+ VIEW COMPARISON:  No priors. FINDINGS: Three views of the right shoulder demonstrate no acute displaced fracture or dislocation. Humeral head is high-riding, suggesting rotator cuff pathology. There is  joint space narrowing, subchondral sclerosis, subchondral cyst formation and osteophyte formation in the glenohumeral and acromioclavicular joints, compatible with osteoarthritis. Numerous surgical clips project over the right hemithorax. IMPRESSION: 1. No acute radiographic abnormality of the right shoulder. 2. Acromioclavicular and glenohumeral joint osteoarthritis with high-riding humeral head suggesting chronic rotator cuff disease. Electronically Signed   By: Vinnie Langton M.D.   On: 02/11/2021 06:37   CT HEAD WO CONTRAST (5MM)  Result Date: 02/11/2021 CLINICAL DATA:  75 year old male fell out of bed. Right side pain. Leukemia, on chemotherapy. EXAM: CT HEAD WITHOUT CONTRAST TECHNIQUE: Contiguous axial images were obtained from  the base of the skull through the vertex without intravenous contrast. COMPARISON:  Recent head CT 02/08/2021. FINDINGS: Brain: Stable cerebral volume. No midline shift, ventriculomegaly, mass effect, evidence of mass lesion, intracranial hemorrhage or evidence of cortically based acute infarction. Gray-white matter differentiation remains normal for age. Vascular: Mild Calcified atherosclerosis at the skull base. No suspicious intracranial vascular hyperdensity. Skull: No fracture or suspicious osseous lesion identified. Sinuses/Orbits: Visualized paranasal sinuses and mastoids are stable and well aerated. Other: Right lateral convexity scalp hematoma (series 3, image 62). Underlying calvarium intact. Other orbit and scalp soft tissues appears stable. IMPRESSION: 1. Right side scalp hematoma without underlying skull fracture. 2. Stable and negative for age non contrast CT appearance of the brain. Electronically Signed   By: Genevie Ann M.D.   On: 02/11/2021 06:55   CT Head Wo Contrast  Result Date: 02/08/2021 CLINICAL DATA:  Fall EXAM: CT HEAD WITHOUT CONTRAST TECHNIQUE: Contiguous axial images were obtained from the base of the skull through the vertex without intravenous  contrast. COMPARISON:  Head CT dated October 26, 2020 FINDINGS: Brain: Chronic white matter ischemic change. Mild generalized atrophy, likely age related. No evidence of acute infarction, hemorrhage, hydrocephalus, extra-axial collection or mass lesion/mass effect. Vascular: No hyperdense vessel or unexpected calcification. Skull: Normal. Negative for fracture or focal lesion. Sinuses/Orbits: No acute finding. Other: None. IMPRESSION: No acute intracranial abnormality. Electronically Signed   By: Yetta Glassman M.D.   On: 02/08/2021 16:23   CT Cervical Spine Wo Contrast  Result Date: 02/11/2021 CLINICAL DATA:  75 year old male fell out of bed. Right side pain. Leukemia, on chemotherapy. EXAM: CT CERVICAL SPINE WITHOUT CONTRAST TECHNIQUE: Multidetector CT imaging of the cervical spine was performed without intravenous contrast. Multiplanar CT image reconstructions were also generated. COMPARISON:  Recent CT cervical spine 02/08/2021. CT head today. Chest CT 10/26/2020. FINDINGS: Alignment: Stable cervical lordosis. Cervicothoracic junction alignment is within normal limits. Bilateral posterior element alignment is within normal limits. Skull base and vertebrae: Osteopenia. Visualized skull base is intact. No atlanto-occipital dissociation. C1 and C2 appear intact and aligned. No acute osseous abnormality identified. Soft tissues and spinal canal: No prevertebral fluid or swelling. No visible canal hematoma. Negative noncontrast visible neck soft tissues. Disc levels: Cervical spine degeneration is mild for age and stable. Upper chest: Mild chronic compression fractures of T1 and T3 are stable since June. Layering right pleural effusion in the apex seems to have intermediate fluid density. Otherwise negative lung apices, visible superior mediastinum. IMPRESSION: 1. No acute traumatic injury identified in the cervical spine. 2. Persistent layering pleural effusion in the right lung apex, possibly exudate of. 3. Mild  chronic T1 and T3 compression fractures. Electronically Signed   By: Genevie Ann M.D.   On: 02/11/2021 06:59   CT Cervical Spine Wo Contrast  Result Date: 02/08/2021 CLINICAL DATA:  Fall EXAM: CT CERVICAL SPINE WITHOUT CONTRAST TECHNIQUE: Multidetector CT imaging of the cervical spine was performed without intravenous contrast. Multiplanar CT image reconstructions were also generated. COMPARISON:  CT cervical spine dated October 26, 2020 FINDINGS: Alignment: Normal. Skull base and vertebrae: No acute fracture. No primary bone lesion or focal pathologic process. Soft tissues and spinal canal: No prevertebral fluid or swelling. No visible canal hematoma. Disc levels: Multilevel degenerative disc disease, most pronounced at C6-C7. Mild facet arthropathy. Upper chest: Partially visualized right pleural effusion. Other: None. IMPRESSION: No CT evidence of acute cervical spine injury. Partially visualized right pleural effusion. Electronically Signed   By: Hosie Poisson.D.  On: 02/08/2021 16:28   CT ABDOMEN PELVIS W CONTRAST  Result Date: 02/11/2021 CLINICAL DATA:  75 year old male with history of trauma after falling out of bed this morning. EXAM: CT ABDOMEN AND PELVIS WITH CONTRAST TECHNIQUE: Multidetector CT imaging of the abdomen and pelvis was performed using the standard protocol following bolus administration of intravenous contrast. CONTRAST:  37m OMNIPAQUE IOHEXOL 350 MG/ML SOLN COMPARISON:  CT the abdomen and pelvis 10/27/2020. FINDINGS: Lower chest: Small to moderate right and trace left pleural effusions with areas of passive subsegmental atelectasis in the lower lobes of the lungs bilaterally. Hepatobiliary: No suspicious cystic or solid hepatic lesions. No intra or extrahepatic biliary ductal dilatation. Status post cholecystectomy. Pancreas: No pancreatic mass. No pancreatic ductal dilatation. No pancreatic or peripancreatic fluid collections or inflammatory changes. Spleen: Unremarkable.  Adrenals/Urinary Tract: Nonobstructive calculi are present within the collecting systems of both kidneys measuring up to 9 mm in the upper pole collecting system of the right kidney and 1 cm in the lower pole collecting system of left kidney. Right-sided double-J ureteral stent in position with proximal loop reformed in the right renal pelvis and distal loop reformed in the region of the urinary bladder. Along the course of the right ureter there are 2 small calculi, largest of which is in the mid ureter (coronal image 47 of series 5 and axial image 60 of series 2) measuring 5 mm. Smaller 3 mm distal ureteral calculus also noted on axial image 67 of series 2. There is moderate right hydroureteronephrosis with extensive periureteric soft tissue stranding. No calculi are noted along the course of the left ureter or within the lumen of the urinary bladder. In the lower pole of the left kidney there is a 1.2 cm exophytic lesion anteriorly, compatible with a simple cyst. No suspicious renal lesions. Bilateral adrenal glands are normal in appearance. Urinary bladder is nearly decompressed, but otherwise unremarkable in appearance. Stomach/Bowel: The appearance of the stomach is normal. There is no pathologic dilatation of small bowel or colon. Normal appendix. Vascular/Lymphatic: Aortic atherosclerosis, without evidence of aneurysm or dissection in the abdominal or pelvic vasculature. No lymphadenopathy noted in the abdomen or pelvis. Reproductive: Prostate gland and seminal vesicles are unremarkable in appearance. Other: No significant volume of ascites.  No pneumoperitoneum. Musculoskeletal: Chronic appearing T12 compression fracture with 20% loss of anterior vertebral body height. There are no aggressive appearing lytic or blastic lesions noted in the visualized portions of the skeleton. Status post right hip arthroplasty. Multifocal edema in the subcutaneous fat of the anterior abdominal wall. IMPRESSION: 1. There is  what appears to be mild edema in the subcutaneous fat of the anterior abdominal wall, which could correlate to areas of soft tissue contusion. No other signs of significant acute traumatic injury are noted elsewhere in the abdomen or pelvis. 2. Right-sided double-J ureteral stent in position with 2 stones along the course of the right ureter and moderate right hydroureteronephrosis which has slightly increased compared to the prior examination, as above. Multiple additional nonobstructive calculi are noted in the collecting systems of both kidneys measuring up to 1 cm in the lower pole collecting system of left kidney. 3. Small to moderate right and trace left pleural effusions with areas of passive subsegmental atelectasis in the lower lobes of the lungs bilaterally. 4. Aortic atherosclerosis. 5. Additional incidental findings, as above. Electronically Signed   By: DVinnie LangtonM.D.   On: 02/11/2021 06:54   DG Chest Portable 1 View  Result Date: 02/11/2021 CLINICAL DATA:  75 year old male with history of fever. EXAM: PORTABLE CHEST 1 VIEW COMPARISON:  Chest x-ray 10/26/2020. FINDINGS: Lung volumes are normal. Mild diffuse peribronchial cuffing. No consolidative airspace disease. No pleural effusions. No pneumothorax. No pulmonary nodule or mass noted. Pulmonary vasculature and the cardiomediastinal silhouette are within normal limits. Numerous small surgical clips project over the right hemithorax. IMPRESSION: 1. Mild diffuse peribronchial cuffing which could suggest an acute bronchitis. Electronically Signed   By: Vinnie Langton M.D.   On: 02/11/2021 06:34   DG Abd Portable 1V  Result Date: 02/11/2021 CLINICAL DATA:  Follow-up post CT contrast to check for right stent placement. EXAM: PORTABLE ABDOMEN - 1 VIEW COMPARISON:  CT abdomen pelvis February 11, 2021 FINDINGS: Right double pigtail ureteral catheter is identified in good position. Moderate right hydronephrosis is noted. IMPRESSION: Right double  pigtail ureteral catheter is identified in good position. Electronically Signed   By: Abelardo Diesel M.D.   On: 02/11/2021 10:38   DG OR UROLOGY CYSTO IMAGE ([facility] ONLY)  Result Date: 02/11/2021 There is no interpretation for this exam.  This order is for images obtained during a surgical procedure.  Please See "Surgeries" Tab for more information regarding the procedure.   DG Hip Unilat W or Wo Pelvis 2-3 Views Right  Result Date: 02/11/2021 CLINICAL DATA:  75 year old male with history of trauma from a fall out of bed. Right hip pain. EXAM: DG HIP (WITH OR WITHOUT PELVIS) 2-3V RIGHT COMPARISON:  No priors. FINDINGS: Patient is status post right hip arthroplasty. The femoral and acetabular components of the prosthesis appear well seated without definite periprosthetic fracture or other acute abnormality. Bony pelvic ring appears intact. Left proximal femur as visualized appears intact. Joint space narrowing, subchondral sclerosis, subchondral cyst formation and osteophyte formation is noted in the left hip joint, compatible with moderate to severe osteoarthritis. The in the of what appears to be a right-sided ureteral stent is noted, with the distal loop reformed in the central pelvis, presumably within the lumen of the urinary bladder. Multiple sutures are noted in the low anatomic pelvis, mid abdomen and left side of the abdomen. IMPRESSION: 1. No acute radiographic abnormality of the bony pelvis or right hip. 2. Status post right hip arthroplasty. 3. 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 Electronically Signed   By: Vinnie Langton M.D.   On: 02/11/2021 06:36    PERFORMANCE STATUS (ECOG) : 3 - Symptomatic, >50% confined to bed  Review of Systems Unless otherwise noted, a complete review of systems is negative.  Physical Exam General: Ill-appearing Cardiovascular: Tachycardic Pulmonary: Unlabored Extremities: no edema, no joint deformities Skin: no rashes Neurological: Weakness but  otherwise nonfocal  IMPRESSION: I met with patient in the ICU.  I briefly introduced palliative care and attempted to establish goals.  Patient verbalized agreement with the current scope of treatment.  He is interested in transferring to [facility] for further care.  Patient will benefit from conversation with his oncology team regarding prognosis as he seems to have limited insight regarding treatment goals.  Patient would want his wife to be his decision-maker if he were unable.  He says he does not have a living will.  Patient has verbalized a desire to remain a full code despite my explanation that such care would likely be futile given his AML and other comorbidities. Recommend palliative care involvement at [facility].   PLAN: -Continue current scope of treatment -Patient is pending transfer to [facility] palliative care team follow at [facility]  Case  and plan discussed with Dr. Mortimer Fries   Time Total: 30 minutes  Visit consisted of counseling and education dealing with the complex and emotionally intense issues of symptom management and palliative care in the setting of serious and potentially life-threatening illness.Greater than 50%  of this time was spent counseling and coordinating care related to the above assessment and plan.  Signed by: Altha Harm, PhD, NP-C

## 2021-02-12 NOTE — Progress Notes (Signed)
Report called to Heron Sabins, RN at [facility] for rm [phone]. [facility] is setting up [facility] ground transport and will call with an ETA.

## 2021-02-12 NOTE — Progress Notes (Signed)
Urology Inpatient Progress Note  Subjective: Pressures discontinued overnight.  Patient remains intermittently febrile and persistently hypertensive this morning.  He is in the ICU on room air. Creatinine stable today, 1.34.  Lactate is stable today, 2.9.  Pancytopenia again noted.   Blood cultures pending with no growth at 1 day.  Urine culture pending.  On anti-infectives as below. Foley catheter in place draining pink urine. Patient reports feeling better today.  He denies flank or bladder pain.  Anti-infectives: Anti-infectives (From admission, onward)    Start     Dose/Rate Route Frequency Ordered Stop   02/12/21 1030  vancomycin (VANCOREADY) IVPB 1500 mg/300 mL       See Hyperspace for full Linked Orders Report.   1,500 mg 150 mL/hr over 120 Minutes Intravenous Every 24 hours 02/11/21 0944     02/12/21 1015  fluconazole (DIFLUCAN) IVPB 400 mg       See Hyperspace for full Linked Orders Report.   400 mg 100 mL/hr over 120 Minutes Intravenous Every 24 hours 02/11/21 0924     02/11/21 2200  ceFEPIme (MAXIPIME) 2 g in sodium chloride 0.9 % 100 mL IVPB       See Hyperspace for full Linked Orders Report.   2 g 200 mL/hr over 30 Minutes Intravenous Every 12 hours 02/11/21 0942     02/11/21 1030  ceFEPIme (MAXIPIME) 1 g in sodium chloride 0.9 % 100 mL IVPB       See Hyperspace for full Linked Orders Report.   1 g 200 mL/hr over 30 Minutes Intravenous  Once 02/11/21 0942 02/11/21 1101   02/11/21 1030  vancomycin (VANCOREADY) IVPB 750 mg/150 mL       See Hyperspace for full Linked Orders Report.   750 mg 150 mL/hr over 60 Minutes Intravenous  Once 02/11/21 0944 02/11/21 1244   02/11/21 1015  fluconazole (DIFLUCAN) IVPB 800 mg       See Hyperspace for full Linked Orders Report.   800 mg 200 mL/hr over 120 Minutes Intravenous  Once 02/11/21 0924 02/11/21 1239   02/11/21 1000  acyclovir (ZOVIRAX) 855 mg in dextrose 5 % 250 mL IVPB        10 mg/kg  85.3 kg 267.1 mL/hr over 60 Minutes  Intravenous Every 8 hours 02/11/21 0921     02/11/21 0845  posaconazole (NOXAFIL) 300 mg in sodium chloride 0.9 % 150 mL IVPB  Status:  Discontinued        300 mg 111.1 mL/hr over 90 Minutes Intravenous Every 24 hours 02/11/21 0839 02/11/21 0924   02/11/21 0545  ceFEPIme (MAXIPIME) 1 g in sodium chloride 0.9 % 100 mL IVPB        1 g 200 mL/hr over 30 Minutes Intravenous  Once 02/11/21 0542 02/11/21 0722   02/11/21 0545  vancomycin (VANCOCIN) IVPB 1000 mg/200 mL premix        1,000 mg 200 mL/hr over 60 Minutes Intravenous  Once 02/11/21 0542 02/11/21 0815       Current Facility-Administered Medications  Medication Dose Route Frequency Provider Last Rate Last Admin   0.9 %  sodium chloride infusion  10 mL/hr Intravenous Once Alfred Levins, [place], MD       acetaminophen (TYLENOL) tablet 650 mg  650 mg Oral Q4H PRN Ottie Glazier, MD   650 mg at 02/11/21 1427   acyclovir (ZOVIRAX) 855 mg in dextrose 5 % 250 mL IVPB  10 mg/kg Intravenous Q8H Nazari, Walid A, RPH 267.1 mL/hr at 02/12/21 0511 855 mg  at 02/12/21 0511   ceFEPIme (MAXIPIME) 2 g in sodium chloride 0.9 % 100 mL IVPB  2 g Intravenous Q12H Lu Duffel, [facility]   Stopped at 02/11/21 2242   Chlorhexidine Gluconate Cloth 2 % PADS 6 each  6 each Topical Q0600 Ottie Glazier, MD   6 each at 02/11/21 1034   fluconazole (DIFLUCAN) IVPB 400 mg  400 mg Intravenous Q24H Ottie Glazier, MD       morphine 2 MG/ML injection 1 mg  1 mg Intravenous Q3H PRN Ottie Glazier, MD   1 mg at 02/12/21 0354   norepinephrine (LEVOPHED) 4mg  in 272mL premix infusion  0-40 mcg/min Intravenous Continuous Alfred Levins, [place], MD   Stopped at 02/11/21 2001   pantoprazole (PROTONIX) injection 40 mg  40 mg Intravenous QHS Ottie Glazier, MD   40 mg at 02/11/21 2214   polyethylene glycol (MIRALAX / GLYCOLAX) packet 17 g  17 g Oral Daily PRN Ottie Glazier, MD       vancomycin (VANCOREADY) IVPB 1500 mg/300 mL  1,500 mg Intravenous Q24H Shanlever, Pierce Crane, RPH        Objective: Vital signs in last 24 hours: Temp:  [97.6 F (36.4 C)-101.2 F (38.4 C)] 101.2 F (38.4 C) (10/10 0400) Pulse Rate:  [85-110] 98 (10/10 0700) Resp:  [14-30] 16 (10/10 0700) BP: (88-134)/(38-88) 96/47 (10/10 0700) SpO2:  [79 %-99 %] 93 % (10/10 0700) Weight:  [89 kg-92.6 kg] 92.6 kg (10/10 0500)  Intake/Output from previous day: 10/09 0701 - 10/10 0700 In: 3585.2 [I.V.:1869; Blood:482; IV Piggyback:1234.1] Out: 950 [Urine:950] Intake/Output this shift: No intake/output data recorded.  Physical Exam Vitals and nursing note reviewed.  Constitutional:      General: He is not in acute distress.    Appearance: He is ill-appearing. He is not toxic-appearing or diaphoretic.  HENT:     Head: Normocephalic and atraumatic.  Pulmonary:     Effort: Pulmonary effort is normal. No respiratory distress.  Neurological:     Mental Status: He is alert and oriented to person, place, and time.  Psychiatric:        Mood and Affect: Mood normal.        Behavior: Behavior normal.   Lab Results:  Recent Labs    02/11/21 0513 02/11/21 1117 02/11/21 1953 02/12/21 0540  WBC 0.6*  --   --  0.8*  HGB 8.0*  --   --  6.1*  HCT 22.4*  --   --  16.5*  PLT 8*   < > 27* 15*   < > = values in this interval not displayed.   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 Bernardo Heater for management of right hydronephrosis with obstruction and septic shock.  Patient remains clinically ill in the ICU today.  Labs are stable.  Patient reports feeling better today.  We will continue to monitor.  If no  clinical improvement by tomorrow, will obtain renal ultrasound  to ensure appropriate renal decompression in the setting of new ureteral stent.  We discussed that he will require outpatient follow-up at [facility] urology for stent removal.  Debroah Loop, PA-C 02/12/2021

## 2021-02-12 NOTE — Consult Note (Signed)
WOC Nurse Consult Note: Patient receiving care in [facility]. Patient can turn self independently in bed. Reason for Consult: "suspected pressure injury" Wound type: DTPI to right trochanter. No wound on sacrum or coccyx Pressure Injury POA: Yes Measurement: 5 cm x 7 cm Wound bed: purple/maroon Drainage (amount, consistency, odor) none Periwound: intact Dressing procedure/placement/frequency: Off load pressure to the right trochanter.  Monitor the wound area(s) for worsening of condition such as: Signs/symptoms of infection,  Increase in size,  Development of or worsening of odor, Development of pain, or increased pain at the affected locations.  Notify the medical team if any of these develop.  Thank you for the consult.  Discussed plan of care with the patient and bedside nurse.  [place] nurse will not follow at this time.  Please re-consult the [place] team if needed.  Val Riles, RN, MSN, CWOCN, CNS-BC, pager [phone]

## 2021-02-12 NOTE — Discharge Summary (Signed)
Physician Discharge Summary  Patient ID: Gordon Farrell MRN: [id number] DOB/AGE: 1946/01/15 75 y.o.  Admit date: 02/11/2021 Discharge date: 02/12/2021  Admission Diagnoses: SYNOPSIS Admitted for septic shock Sources of infection-ureteral stent fever to 103 degrees.  CT showed increased right hydronephrosis compared with a prior CT with stent June 2022.    Patient description:  75 year old male has a history of recurrent DVTs, chronic atrial fibrillation with systolic CHF, previous CVA pulmonary embolism, history of sleep apnea Crohn's disease, recurrent vomiting, history of recurrent nephrolithiasis, history of ventral hernia, obesity, BPH, chronic vitamin B6 deficiency, chronic pancytopenia seen by oncology Dr. 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  Followed up with oncology at [facility].  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  He finished 5 cycles of azacitidine-venetoclax.  He received transfusion with 2 units of PRBCs 1 week prior to admission.     Procedure: Cystoscopy Right ureteral stent removal Right ureteral stent placement Right retrograde pyelogram with interpretation Intraoperative fluoroscopy and <1  Intraoperative findings:  Urethra normal in caliber without stricture Nonocclusive prostate Inflammatory changes right hemitrigone secondary to indwelling stent Mild stent encrustation Persistent contrast with moderate hydronephrosis and hydroureter to the proximal/mid ureteral junction (12-hour postcontrast administration) Right retrograde pyelogram with moderate hydronephrosis and no contrast extravasation   Discharge Diagnoses:  Active Problems:   Pancytopenia ([place])   Sepsis with acute organ dysfunction and septic shock (HCC)   Pressure injury of skin   Hydronephrosis of right kidney   AML (acute myeloid leukemia) (HCC)   AKI (acute kidney injury) ([place])    Severe protein-calorie malnutrition (HCC)   Bilateral pleural effusion   Neutropenic fever (HCC)   Discharged Condition: fair   Admitted Septic shock infected uretral stent s/p reaplcement   -use vasopressors to keep MAP>65 as needed -follow ABG and LA as needed -follow up cultures -emperic ABX -may consider stress dose steroids -aggressive IV fluid Resuscitation     Recurrent nephrolithiasis Status post ureteral stent -status post CT imaging: Right-sided double-J ureteral stent in position with 2 stones along the course of the right ureter and moderate right hydroureteronephrosis which has slightly increased compared to the prior examination, as above. Multiple additional nonobstructive calculi are noted in the collecting systems of both kidneys measuring up to 1 cm in the lower pole collecting system of left kidney. Follow up Urology recs     ACUTE KIDNEY INJURY/Renal Failure -continue Foley Catheter-assess need -Avoid nephrotoxic agents -Follow urine output, BMP -Ensure adequate renal perfusion, optimize oxygenation -Renal dose medications     Intake/Output Summary (Last 24 hours) at 02/12/2021 0746 Last data filed at 02/12/2021 0400    Gross per 24 hour  Intake 3585.15 ml  Output 950 ml  Net 2635.15 ml        ONCOLOGY  Acute myeloid leukemia Per [facility] oncology: 02/05/21 Plan and Recommendations: Acute Myeloid Leukemia: IDH1+, could consider ivosidenib in future - C1D5 azacitidine-venetoclax  Azacitidine 75 mg/m2 subcutaneous days 1-7  Venetoclax 100 mg daily Days 1-28 (dose-reduced on posaconazole)  - Allopurinol daily during first cycle and when there is active leukemia - repeat BMBx 10/18 - orders in place - RTC weekly to see CPP/APP   1. Antimicrobial prophylaxis: AML (not in remission): - Bacterial: Levofloxacin 593m PO daily (absolute neutrophils >/= 0.5 until absolute neutrophils >/= 0.5);  - Fungal: AML fungal: Posaconazole 3015mPO daily  (absolute neutrophils >/= 0.5 until  absolute neutrophils >/= 0.5);  - Viral: Valacyclovir 562m PO daily (continuous)     Leukoreduced blood products are required. Irradiated blood products are preferred, but in case of urgent transfusion needs non-irradiated blood products may be used:  -Granix initiated for severe leukopenia until further recommendations by medical oncologist       Severe protein calorie malnutrition -Bitemporal and peripheral muscle wasting Hypoalbuminemia with third spacing on examination Monitor for refeeding syndrome -RD nutritional evaluation     GI GI PROPHYLAXIS as indicated   NUTRITIONAL STATUS DIET-->as tolerated Constipation protocol as indicated     ENDO - ICU hypoglycemic\Hyperglycemia protocol -check FSBS per protocol     ELECTROLYTES -follow labs as needed -replace as needed -pharmacy consultation and following       DVT/GI PRX ordered -SCDs  TRANSFUSIONS AS NEEDED MONITOR FSBS ASSESS the need for LABS as needed         DVT/GI PRX  assessed I Assessed the need for Labs I Assessed the need for Foley I Assessed the need for Central Venous Line Family Discussion when available I Assessed the need for Mobilization I made an Assessment of medications to be adjusted accordingly Safety Risk assessment completed   CASE DISCUSSED IN MULTIDISCIPLINARY ROUNDS WITH ICU TEAM     Discharge Exam: Blood pressure (!) 96/51, pulse 94, temperature 98.4 F (36.9 C), temperature source Oral, resp. rate (!) 22, height '5\' 9"'  (1.753 m), weight 92.6 kg, SpO2 99 %.   Disposition: TRANSFER TO [facility] Dr [facility]loriann LoanWITH [facility] THE PATIENT    Allergies as of 02/12/2021   No Known Allergies      Medication List     STOP taking these medications    allopurinol 300 MG tablet Commonly known as: ZYLOPRIM   apixaban 2.5 MG Tabs tablet Commonly known as: ELIQUIS   finasteride 5 MG tablet Commonly known as: PROSCAR    furosemide 20 MG tablet Commonly known as: LASIX   gabapentin 100 MG capsule Commonly known as: NEURONTIN   latanoprost 0.005 % ophthalmic solution Commonly known as: XALATAN   levofloxacin 500 MG tablet Commonly known as: LEVAQUIN   levothyroxine 100 MCG tablet Commonly known as: SYNTHROID   metoprolol succinate 25 MG 24 hr tablet Commonly known as: TOPROL-XL   metoprolol tartrate 25 MG tablet Commonly known as: LOPRESSOR   Noxafil 100 MG Tbec delayed-release tablet Generic drug: posaconazole   pantoprazole 40 MG tablet Commonly known as: Protonix   pilocarpine 5 MG tablet Commonly known as: SALAGEN   potassium chloride 10 MEQ tablet Commonly known as: KLOR-CON   pravastatin 40 MG tablet Commonly known as: PRAVACHOL   prochlorperazine 10 MG tablet Commonly known as: COMPAZINE   sertraline 100 MG tablet Commonly known as: ZOLOFT   timolol 0.5 % ophthalmic solution Commonly known as: TIMOPTIC   traMADol 50 MG tablet Commonly known as: ULTRAM   traZODone 100 MG tablet Commonly known as: DESYREL   valACYclovir 500 MG tablet Commonly known as: VALTREX   venetoclax 100 MG tablet Commonly known as: VENCLEXTA       TAKE these medications    ceFEPIme 2 g in sodium chloride 0.9 % 100 mL Inject 2 g into the vein every 12 (twelve) hours.   norepinephrine 4-5 MG/250ML-% Soln Commonly known as: LEVOPHED Inject 0-40 mcg/min into the vein continuous.         Signed: KFlora Lipps10/02/2021, 12:56 PM

## 2021-02-12 NOTE — Progress Notes (Signed)
GOALS OF CARE DISCUSSION  The Clinical status was relayed to family in detail. Wife Silva Bandy Updated and notified of patients medical condition. Severe sepsis Explained to family course of therapy and the modalities    PATIENT REMAINS FULL CODE Requests transfer to [facility] pending Dr Gloriann Loan is Oncologist at [facility] are satisfied with Plan of action and management. All questions answered  Additional CC time 25 mins   Keora Eccleston Patricia Pesa, M.D.  [facility]  Medical Director [place] Director [facility] Cardio-Pulmonary Department

## 2021-02-12 NOTE — Progress Notes (Addendum)
Name: Gordon Farrell MRN: [id number] DOB: 09-03-1945     LOS: 1  SYNOPSIS Admitted for septic shock Sources of infection-ureteral stent fever to 103 degrees.  CT showed increased right hydronephrosis compared with a prior CT with stent June 2022.   Patient description:  75 year old male has a history of recurrent DVTs, chronic atrial fibrillation with systolic CHF, previous CVA pulmonary embolism, history of sleep apnea Crohn's disease, recurrent vomiting, history of recurrent nephrolithiasis, history of ventral hernia, obesity, BPH, chronic vitamin B6 deficiency, chronic pancytopenia seen by oncology Dr. 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  Followed up with oncology at [facility]  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  He finished 5 cycles of azacitidine-venetoclax.  He received transfusion with 2 units of PRBCs 1 week prior to admission.     Microbiology/Sepsis markers: Results for orders placed or performed during the hospital encounter of 02/11/21  Resp Panel by RT-PCR (Flu A&B, Covid) Nasopharyngeal Swab     Status: None   Collection Time: 02/11/21  5:13 AM   Specimen: Nasopharyngeal Swab; Nasopharyngeal(NP) swabs in vial transport medium  Result Value Ref Range Status   SARS Coronavirus 2 by RT PCR NEGATIVE NEGATIVE Final    Comment: (NOTE) SARS-CoV-2 target nucleic acids are NOT DETECTED.  The SARS-CoV-2 RNA is generally detectable in upper respiratory specimens during the acute phase of infection. The lowest concentration of SARS-CoV-2 viral copies this assay can detect is 138 copies/mL. A negative result does not preclude SARS-Cov-2 infection and should not be used as the sole basis for  treatment or other patient management decisions. A negative result may occur with  improper specimen collection/handling, submission of specimen other than nasopharyngeal swab, presence of viral mutation(s) within the areas targeted by this assay, and inadequate number of viral copies(<138 copies/mL). A negative result must be combined with clinical observations, patient history, and epidemiological information. The expected result is Negative.  Fact Sheet for Patients:  EntrepreneurPulse.com.au  Fact Sheet for Healthcare Providers:  IncredibleEmployment.be  This test is no t yet approved or cleared by the [place] FDA and  has been authorized for detection and/or diagnosis of SARS-CoV-2 by FDA under an Emergency Use Authorization (EUA). This EUA will remain  in effect (meaning this test can be used) for the duration of the COVID-19 declaration under Section 564(b)(1) of the Act, 21 U.S.C.section 360bbb-3(b)(1), unless the authorization is terminated  or revoked sooner.       Influenza A by PCR NEGATIVE NEGATIVE Final   Influenza B by PCR NEGATIVE NEGATIVE Final    Comment: (NOTE) The Xpert Xpress SARS-CoV-2/FLU/RSV plus assay is intended as an aid in the diagnosis of influenza from Nasopharyngeal swab specimens and should not be used as a sole basis for treatment. Nasal washings and aspirates are unacceptable for Xpert Xpress SARS-CoV-2/FLU/RSV testing.  Fact Sheet for Patients: EntrepreneurPulse.com.au  Fact Sheet for Healthcare Providers: IncredibleEmployment.be  This test is not yet approved or cleared by the [place] FDA and has been authorized for detection and/or diagnosis of SARS-CoV-2 by FDA under an Emergency Use Authorization (EUA). This EUA will remain in effect (meaning this test can be used) for the duration of the COVID-19 declaration under Section 564(b)(1) of the Act, 21  U.S.C. section 360bbb-3(b)(1), unless the authorization is terminated or revoked.  Performed at [facility], [address], [place],  [address]  Blood Culture (routine x 2)     Status: None (Preliminary result)   Collection Time: 02/11/21  5:13 AM   Specimen: BLOOD  Result Value Ref Range Status   Specimen Description BLOOD LEFT WRIST  Final   Special Requests   Final    BOTTLES DRAWN AEROBIC AND ANAEROBIC Blood Culture results may not be optimal due to an excessive volume of blood received in culture bottles   Culture   Final    NO GROWTH 1 DAY Performed at [facility], [address], [place], [place] [address]    Report Status PENDING  Incomplete  Blood Culture (routine x 2)     Status: None (Preliminary result)   Collection Time: 02/11/21  5:50 AM   Specimen: BLOOD  Result Value Ref Range Status   Specimen Description BLOOD LEFT AC  Final   Special Requests   Final    BOTTLES DRAWN AEROBIC AND ANAEROBIC Blood Culture results may not be optimal due to an excessive volume of blood received in culture bottles   Culture   Final    NO GROWTH 1 DAY Performed at [facility], [address], [place], [place] [address]    Report Status PENDING  Incomplete  MRSA Next Gen by PCR, Nasal     Status: None   Collection Time: 02/11/21  8:49 AM   Specimen: Nasal Mucosa; Nasal Swab  Result Value Ref Range Status   MRSA by PCR Next Gen NOT DETECTED NOT DETECTED Final    Comment: (NOTE) The GeneXpert MRSA Assay (FDA approved for NASAL specimens only), is one component of a comprehensive MRSA colonization surveillance program. It is not intended to diagnose MRSA infection nor to guide or monitor treatment for MRSA infections. Test performance is not FDA approved in patients less than 55 years old. Performed at [facility], [address] [place], [place] [address]   Aerobic/Anaerobic Culture w Gram Stain (surgical/deep wound)      Status: None (Preliminary result)   Collection Time: 02/11/21 10:41 AM   Specimen: Abdomen; Tissue  Result Value Ref Range Status   Specimen Description   Final    ABDOMEN Performed at [facility], [address] [place], [place] [address]    Special Requests   Final    NONE Performed at [facility], [address] [place], [place] [address]    Gram Stain   Final    NO WBC SEEN NO ORGANISMS SEEN Performed at [facility], [place] [address], [place], [place] [address]    Culture PENDING  Incomplete   Report Status PENDING  Incomplete  Aerobic/Anaerobic Culture w Gram Stain (surgical/deep wound)     Status: None (Preliminary result)   Collection Time: 02/11/21  6:22 PM   Specimen: PATH Other; Urine  Result Value Ref Range Status   Specimen Description   Final    PELVIS URINE Performed at [facility], [place] [address] [place], [place] [address]    Special Requests   Final    NONE Performed at [facility], [place], [place] [address]    Gram Stain   Final    NO SQUAMOUS EPITHELIAL CELLS SEEN NO WBC SEEN NO ORGANISMS SEEN Performed at [facility], [place] [address] [place],  [address]    Culture PENDING  Incomplete   Report Status PENDING  Incomplete    Anti-infectives:  Anti-infectives (From admission, onward)    Start  Dose/Rate Route Frequency Ordered Stop   02/12/21 1030  vancomycin (VANCOREADY) IVPB 1500 mg/300 mL       See Hyperspace for full Linked Orders Report.   1,500 mg 150 mL/hr over 120 Minutes Intravenous Every 24 hours 02/11/21 0944     02/12/21 1015  fluconazole (DIFLUCAN) IVPB 400 mg       See Hyperspace for full Linked Orders Report.   400 mg 100 mL/hr over 120 Minutes Intravenous Every 24 hours 02/11/21 0924     02/11/21 2200  ceFEPIme (MAXIPIME) 2 g in sodium chloride 0.9 % 100 mL IVPB       See Hyperspace for full Linked Orders Report.   2 g 200 mL/hr over 30 Minutes  Intravenous Every 12 hours 02/11/21 0942     02/11/21 1030  ceFEPIme (MAXIPIME) 1 g in sodium chloride 0.9 % 100 mL IVPB       See Hyperspace for full Linked Orders Report.   1 g 200 mL/hr over 30 Minutes Intravenous  Once 02/11/21 0942 02/11/21 1101   02/11/21 1030  vancomycin (VANCOREADY) IVPB 750 mg/150 mL       See Hyperspace for full Linked Orders Report.   750 mg 150 mL/hr over 60 Minutes Intravenous  Once 02/11/21 0944 02/11/21 1244   02/11/21 1015  fluconazole (DIFLUCAN) IVPB 800 mg       See Hyperspace for full Linked Orders Report.   800 mg 200 mL/hr over 120 Minutes Intravenous  Once 02/11/21 0924 02/11/21 1239   02/11/21 1000  acyclovir (ZOVIRAX) 855 mg in dextrose 5 % 250 mL IVPB        10 mg/kg  85.3 kg 267.1 mL/hr over 60 Minutes Intravenous Every 8 hours 02/11/21 0921     02/11/21 0845  posaconazole (NOXAFIL) 300 mg in sodium chloride 0.9 % 150 mL IVPB  Status:  Discontinued        300 mg 111.1 mL/hr over 90 Minutes Intravenous Every 24 hours 02/11/21 0839 02/11/21 0924   02/11/21 0545  ceFEPIme (MAXIPIME) 1 g in sodium chloride 0.9 % 100 mL IVPB        1 g 200 mL/hr over 30 Minutes Intravenous  Once 02/11/21 0542 02/11/21 0722   02/11/21 0545  vancomycin (VANCOCIN) IVPB 1000 mg/200 mL premix        1,000 mg 200 mL/hr over 60 Minutes Intravenous  Once 02/11/21 0542 02/11/21 0815      INTERVAL CHANGES Weaned off pressors Alert and awake \  Consults: Treatment Team:  Abbie Sons, MD  Neurology and heme-onc   PHYSICAL EXAMINATION   Vital Signs: Temp:  [97.6 F (36.4 C)-101.2 F (38.4 C)] 101.2 F (38.4 C) (10/10 0400) Pulse Rate:  [85-110] 98 (10/10 0700) Resp:  [14-30] 16 (10/10 0700) BP: (88-134)/(38-88) 96/47 (10/10 0700) SpO2:  [79 %-99 %] 93 % (10/10 0700) Weight:  [89 kg-92.6 kg] 92.6 kg (10/10 0500)  Physical Examination:   General Appearance: No distress  EYES PERRLA, EOM intact.   NECK Supple, No JVD Pulmonary: normal breath sounds,  No wheezing.  CardiovascularNormal S1,S2.  No m/r/g.   Abdomen: Benign, Soft, non-tender. Skin:   warm, no rashes, no ecchymosis  Extremities: normal, no cyanosis, clubbing. Neuro:without focal findings,  speech normal  PSYCHIATRIC: Mood, affect within normal limits. Sacral pressure wound down to anal area stage 2-3 present on admission.  Additional integumentary findings as below.  Media Information Document Information    PERTINENT DATA     Infusions:  sodium chloride     acyclovir 855 mg (02/12/21 0511)   ceFEPime (MAXIPIME) IV Stopped (02/11/21 2242)   fluconazole (DIFLUCAN) IV     norepinephrine (LEVOPHED) Adult infusion Stopped (02/11/21 2001)   vancomycin     Scheduled Medications:  Chlorhexidine Gluconate Cloth  6 each Topical Q0600   pantoprazole (PROTONIX) IV  40 mg Intravenous QHS    Intake/Output: 10/09 0701 - 10/10 0700 In: 3585.2 [I.V.:1869; Blood:482; IV Piggyback:1234.1] Out: 950 [Urine:950]    LAB RESULTS:  Basic Metabolic Panel: Recent Labs  Lab 02/08/21 1526 02/11/21 0513 02/11/21 1953 02/12/21 0540  NA 135 136  --  134*  K 3.4* 3.5 3.3* 3.5  CL 105 103  --  104  CO2 23 24  --  23  GLUCOSE 119* 123*  --  124*  BUN 23 29*  --  31*  CREATININE 1.15 1.36*  --  1.34*  CALCIUM 8.1* 8.3*  --  7.6*  MG  --  1.4* 1.3* 2.1  PHOS  --  1.7* 3.5 2.3*    Liver Function Tests: Recent Labs  Lab 02/11/21 0513  AST 36  ALT 25  ALKPHOS 133*  BILITOT 2.8*  PROT 5.3*  ALBUMIN 2.5*    Recent Labs  Lab 02/11/21 0513  LIPASE 24   No results for input(s): AMMONIA in the last 168 hours. CBC: Recent Labs  Lab 02/08/21 1526 02/11/21 0513 02/11/21 1117 02/11/21 1953 02/12/21 0540  WBC 0.2* 0.6*  --   --  0.8*  NEUTROABS  --  0.5*  --   --   --   HGB 8.1* 8.0*  --   --  6.1*  HCT 22.9* 22.4*  --   --  16.5*  MCV 91.6 91.4  --   --  91.2  PLT 10* 8* 22* 27* 15*    Cardiac Enzymes: No results for input(s): CKTOTAL, CKMB, CKMBINDEX,  TROPONINI in the last 168 hours. BNP: Invalid input(s): POCBNP CBG: Recent Labs  Lab 02/11/21 1942 02/12/21 0350  GLUCAP 97 100*      ASSESSMENT AND PLAN   Admitted Septic shock infected uretral stent s/p reaplcement  -use vasopressors to keep MAP>65 as needed -follow ABG and LA as needed -follow up cultures -emperic ABX -may consider stress dose steroids -aggressive IV fluid Resuscitation   Recurrent nephrolithiasis Status post ureteral stent -status post CT imaging: Right-sided double-J ureteral stent in position with 2 stones along the course of the right ureter and moderate right hydroureteronephrosis which has slightly increased compared to the prior examination, as above. Multiple additional nonobstructive calculi are noted in the collecting systems of both kidneys measuring up to 1 cm in the lower pole collecting system of left kidney. 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 Antimicrobial prophylaxis: AML (not in remission): - Bacterial: Levofloxacin 548m PO daily (absolute neutrophils >/= 0.5 until absolute neutrophils >/= 0.5);  - Fungal: AML fungal: Posaconazole  3093mPO daily (absolute neutrophils >/= 0.5 until absolute neutrophils >/= 0.5);  - Viral: Valacyclovir 50037mO daily (continuous)   Leukoreduced blood products are required. Irradiated blood products are preferred, but in case of urgent transfusion needs non-irradiated blood products may be used:  -Granix initiated for severe leukopenia until further recommendations by medical oncologist    Severe protein calorie malnutrition -Bitemporal and peripheral muscle wasting Hypoalbuminemia with third spacing on examination Monitor for refeeding syndrome -RD nutritional evaluation   GI GI PROPHYLAXIS as indicated  NUTRITIONAL STATUS DIET-->as tolerated Constipation protocol as indicated   ENDO - ICU hypoglycemic\Hyperglycemia protocol -check FSBS per protocol   ELECTROLYTES -follow labs as needed -replace as needed -pharmacy consultation and following    DVT/GI PRX ordered -SCDs  TRANSFUSIONS AS NEEDED MONITOR FSBS ASSESS the need for LABS as needed     DVT/GI PRX  assessed I Assessed the need for Labs I Assessed the need for Foley I Assessed the need for Central Venous Line Family Discussion when available I Assessed the need for Mobilization I made an Assessment of medications to be adjusted accordingly Safety Risk assessment completed  CASE DISCUSSED IN MULTIDISCIPLINARY ROUNDS WITH ICU TEAM     Critical Care Time devoted to patient care services described in this note is 40 minutes.   Overall, patient is critically ill, prognosis is guarded.    KurCorrin Parker.D.  [facility]  Medical Director ICUDouglasrector ARMMetro Surgery Cent[facility]-Pulmonary Department

## 2021-02-12 NOTE — Unmapped (Signed)
Wife returned VIR pre call.  Informed that patient currently inpatient in [place] and is getting transferred to [facility].  Currently being admitted to MICU.

## 2021-02-12 NOTE — Unmapped (Signed)
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 *      HPI: David Riley is a 75 y.o. male with PMHx of atrial fibrillation (on Eliquis), MGUS, Crohn's disease, hypothyroidism, CAD, HFpEF, BPH, depression/insomnia and newly diagnosed AML that presents to [facility] MICU with sepsis.    Neurological     Hx of recent fall  Pt presented to OSH for a fall, reportedly from bed. Pt apparently fell on right side. Ct Head: right side scalp hematoma with no underlying skull fracture.  CT cervical spine with no noticeable traumatic injury in cervical spine. pleural effusion noted on right lung apex. Mild chronic T1 and T3 compression fractures.Patient alert and orient on arrival to [facility] MICU, no acute concern for worsening hematoma or intracranial process.  - monitor for worsening hematoma  - serial neuro checks    Pain (acute and chronic)  -sch tylenol  -PRN oxycodone  [ ]  consider morphine if pain still not controlled    Pulmonary     Incidental right pleural effusion   right lung pleural effusion noted on CT cervical spine at OSH. Pt currently with no supplemental O2 requirement or worsening respiratory status.   [ ]  consider POCUS of lungs    Hx of OSA  -consider CPAP at night, unsure if pt uses CPAP at home    Cardiovascular     Hypotension requiring pressors, resolved  Patient hypotensive at OSH requiring pressors, His BP has been stable since arriving to MICU off pressors  - continue to monitor  - continue fluid resuscitation, consider pressors if unable to maintain MAPs >65 after fluids    Renal     AKI  Cr noted to be 1.3 at OSH, slightly improved to 1.2 on arrival. AKI likely prerenal iso of sepsis. Unlikely AKI will completely resolve as there's concern for chronic obstruction d/t his bilateral nephrolithiasis (although non obstructing).  -monitor UOP  -monitor with daily BMP  -continue foley  -avoid nephrotoxic drugs    Hx of nephrolithiasis s/p stent placement  CT imaging at OSH: Right-sided double-J ureteral stent in position with 2 stones along the course of the right ureter and moderate right  hydroureteronephrosis which has slightly increased compared to the prior examination, as above. Multiple additional nonobstructive  calculi are noted in the collecting systems of both kidneys  measuring up to 1 cm in the lower pole collecting system of left  Kidney.  - continue fluids  -maintain foley  - consider urology consult for additional recs     Electrolytes derangements  -replete PRN    Infectious Disease/Autoimmune     Concern for sepsis without septic shock   - Pt febrile to 103.4, with a lactate of 4. Concern of ongoing sepsis, likely source is urinary. Pt has hx or nephrolithiasis with bilateral stents. Stents exchanged at OSH  -start vanc, cefepime  -f/u repeat blood LR cx, (MRSA scr neg at OSH)  -f/u UA  -f/u urine  cx    Cultures:  No results found for: BLOOD CULTURE, URINE CULTURE, LOWER RESPIRATORY CULTURE  WBC (10*9/L)   Date Value   03/14/21 1.0 (L)     WBC, UA (/HPF)   Date Value   03-14-2021 23 (H)           Sepsis Protocol Documentation:     Note Type:  IP Initial Sepsis Exam    Patient's Weight:  95.4 kg (210 lb 5.1 oz), Ideal body weight: 70.7 kg (155 lb 13.8 oz), Body mass index is 31.06 kg/m??.    Pre-Arrival Fluid Volume:  No data recorded    Did the patient have pre-arrival hypotension (SBP <90 and/or MAP <65)?      Were there 2 or more episodes of hypotension within the last 3 hours?        Initial:  Bacterial infection suspected or identified  Fluid Requirements Assessment:      Has the patient had 2 hypotensive episodes in the last 3 hours:  No    Has the patient had a lactate > or = 4 in the last 3 hours:  Yes    BMI IV Fluid Dosing:  Ideal body weight used in IVF bolus dosing (BMI > 30)    Patient Weight (Kg):  95.4    30 mL/kg Calculated Volume (mL) Recommendation:  2862  IV Fluid Within The Last 6 Hours:     IV Fluid Administered Within The Last 6 hrs. (mL):  1000        FEN/GI     Hypophosphatemia - concern for refeeding syndrome  Pt hypophosphatemic on presentation, concerning for refeeding syndrome  -monitor with daily Mg, phos    Diet, Bowel Reg  -oral diet as tolerated  -PRN bowel regimen      Heme/Coag     Acute Myeloid Leukemia  Pancytopenia  Pt with recent diagnosis of Acute Myeloid Leukemia, follows up with [facility] oncology. He has completed 5 cycles of azacitidine-venetoclax. Per onc notes, pt is pending repeat BMBx 10/18. He is on Antimicrobial prophylaxis Bacterial: Levofloxacin 500mg  PO daily (absolute neutrophils >/= 0.5 until absolute neutrophils >/= 0.5); Fungal: AML fungal: Posaconazole 300mg  PO daily (absolute neutrophils >/= 0.5 until absolute neutrophils >/= 0.5); and  Viral: Valacyclovir 500mg  PO daily (continuous)  -Make sure to use leukoreduced blood products. Irradiated blood products are preferred, but in case of urgent transfusion needs non-irradiated blood products may be used:   - malignant heme consulted and made aware of patient as he follows with [facility] oncology    Endocrine     NAI    Integumentary   #  - WOCN consulted for high risk skin assessment Yes.  - WOCN recs >> pending   - cont pressure mitigating precautions per skin policy    Prophylaxis/LDA/Restraints/Consults   Can CVC be removed? N/A, no CVC present (including vascular catheter for HD or PLEX)   Can A-line be removed? N/A, no A-line present  Can Foley be removed? No: Need continuous I/O  Mobility plan: Step 2 - Head of bed elevation (>60 degrees)    Feeding: Oral diet  Analgesia: Pain not adequately controlled, titrating medications  Sedation SAT/SBT: N/A  Thromboembolic ppx: Mechanical only, chemical contraindicated secondary to platelets <50  Head of bed >30 degrees: Yes  Ulcer ppx: Yes, coagulopathy  Glucose within target range: Yes, in range    Does patient need/have an active type/screen? Yes    RASS at goal? N/A, not on sedation  [place]  Agitation Assessment Scale (RASS) : 0 (March 06, 2021  8:00 PM)     Can antipsychotics be stopped? N/A, not on antipsychotics       Would hospice care be appropriate for this patient? No, patient requiring support not compatible with hospice  Any unaddressed hospice/palliative care needs? no    Patient Lines/Drains/Airways Status     Active Active Lines, Drains, & Airways     Name Placement date Placement time Site Days    Urethral Catheter Temperature probe 03/06/2021  --  Temperature probe  less than 1    Peripheral IV March 06, 2021 Left Antecubital 03/06/2021  1740  Antecubital  less than 1    Peripheral IV 03/06/21 Left;Posterior Forearm 03-06-21  1740  Forearm  less than 1    Peripheral IV 03/06/2021 Left;Posterior;Proximal Forearm March 06, 2021  1750  Forearm  less than 1              Patient Lines/Drains/Airways Status     Active Wounds     Name Placement date Placement time Site Days    Surgical Site 10/03/20 Perineum 10/03/20  1528  -- 132                Goals of Care     Code Status: Full Code    Designated Healthcare Decision Maker:  Mr. Ragas current decisional capacity for healthcare decision-making is Full capacity. His designated healthcare decision maker(s) is/are   HCDM (patient stated preference): Caldwell,[name] Lerae - Spouse - [phone].      Subjective     HPI:    David Riley is a 75 y.o. male with past medical history significant for recurrent DVTs, Afib, CHF, previous CVA, PE, and recurrent nephrolithiasis (s/p bilat ureteral stent placement) presents as a transfer from OSH for urosepsis. Pt had initially presented there after a fall, found to have UTI, had exchange of his stents with urology.  Patient transferred to [facility] due to concern for septic shock. He was hypotensive requiring pressors at OSH. His BP have since been stable, not requiring pressors en-route, or on arrival at [facility] MICU.    On arrival, pt was febrile to 103, had a lactate of 4 and appeared uncomfortable due to pain. He was started on broad spec abx, fluids. Malignant heme made aware of pt.    ROS:  Limited review of systems is obtained on arrival, patient endorsed generalized pain, mainly his back,     Allergies  No Known Allergies    Meds  No current facility-administered medications on file prior to encounter.     Current Outpatient Medications on File Prior to Encounter   Medication Sig   ??? allopurinol (ZYLOPRIM) 300 MG tablet Take 1 tablet (300 mg total) by mouth daily.   ??? finasteride (PROSCAR) 5 mg tablet Take 1 tablet (5 mg total) by mouth daily.   ??? furosemide (LASIX) 20 MG tablet Take 1 tablet (20 mg total) by mouth daily as needed for swelling (STOP if weight decreased 5 pounds).   ??? gabapentin (NEURONTIN) 100 MG capsule Take up to 3 capsules tid or as directed.   ??? hydrocortisone 2.5 % cream Apply to face 2x/day as needed   ??? latanoprost (XALATAN) 0.005 % ophthalmic solution Administer 1 drop to both eyes nightly.    ??? levoFLOXacin (LEVAQUIN) 500 MG tablet Take 1 tablet (500 mg total) by mouth daily.   ??? levothyroxine (SYNTHROID) 100 MCG tablet Take 1 tablet (100 mcg total) by mouth daily.   ??? lidocaine 2%  viscous (XYLOCAINE) 2 % Soln Take 15 mls by mouth every four (4) hours as needed for mouth pain.   ??? metoprolol succinate (TOPROL-XL) 25 MG 24 hr tablet Take 1 tablet (25 mg total) by mouth daily.   ??? pilocarpine (SALAGEN, PILOCARPINE,) 5 MG tablet Take 1 tablet (5 mg total) by mouth Three (3) times a day.   ??? posaconazole (NOXAFIL) 100 mg delayed released tablet Take 3 tablets (300 mg total) by mouth daily. Do not start until instructed by outpatient oncology team.   ??? potassium chloride (KLOR-CON) 10 MEQ CR tablet Take 2 tablets (20 mEq total) by mouth daily as needed (Take Daily IF you take Lasix same day).   ??? pravastatin (PRAVACHOL) 40 MG tablet Take 1 tablet (40 mg total) by mouth every evening.   ??? prochlorperazine (COMPAZINE) 10 MG tablet Take 1 tablet (10 mg total) by mouth every six (6) hours as needed for nausea.   ??? sertraline (ZOLOFT) 100 MG tablet Take 1 tablet (100 mg total) by mouth daily.   ??? timolol (TIMOPTIC) 0.5 % ophthalmic solution Administer 1 drop to the right eye Two (2) times a day.    ??? traMADoL (ULTRAM) 50 mg tablet Take 1 tablet (50 mg total) by mouth every six (6) hours as needed for pain.   ??? traZODone (DESYREL) 100 MG tablet Take 1 tablet (100 mg total) by mouth nightly.   ??? valACYclovir (VALTREX) 500 MG tablet Take 1 tablet (500 mg total) by mouth daily.   ??? venetoclax (VENCLEXTA) 100 mg tablet Take 1 tablet (100 mg total) by mouth daily. Take with a meal and water. Do not chew, crush, or break tablets. Do not start until instructed by outpatient oncology team.   ??? vit A/vit C/vit E/zinc/copper (PRESERVISION AREDS ORAL) Take by mouth Two (2) times a day.       Past Medical History  Past Medical History:   Diagnosis Date   ??? Acute myeloid leukemia (CMS-HCC) 01/19/2021   ??? Anxiety    ??? At risk for falls    ??? Barrett's esophagus 2015    negative for dysplasia   ??? Clotting disorder (CMS-HCC)    ??? Cognitive impairment    ??? Crohn's disease (CMS-HCC)     Inactive.  Not on any meds.  No activity on colonoscopy, diagnosed in 1977, he has had bowel resections (unclear distribution).    ??? CVA (cerebral vascular accident) (CMS-HCC) 10/28/2020   ??? Deep vein thrombosis (DVT) (CMS-HCC)    ??? Depression    ??? Disease of thyroid gland    ??? Hyperlipidemia    ??? Hypothyroidism    ??? LOW BACK PAIN    ??? Nausea alone    ??? Noninfectious otitis externa 06/02/2016   ??? PULMONARY EMBOLISM    ??? 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 Sleep apnea, obstructive    ??? Tinnitus    ??? Urinary calculus 10/24/2010       Past Surgical History  Past Surgical History:   Procedure Laterality Date   ??? BOWEL RESECTION  75 y/o    D/t Crohn's disease   ??? CHOLECYSTECTOMY     ??? JOINT REPLACEMENT Right     hip   ??? PR BREATH HYDROGEN TEST N/A 10/05/2014    Procedure: BREATH HYDROGEN TEST;  Surgeon: Nurse-Based Giproc;  [facility]: GI PROCEDURES MEM[facility]IAL [facility];  Service: Gastroenterology   ??? PR COLONOSCOPY W/BIOPSY SINGLE/MULTIPLE N/A 10/11/2016    Procedure: COLONOSCOPY, FLEXIBLE, PROXIMAL TO SPLENIC FLEXURE; WITH BIOPSY,  SINGLE OR MULTIPLE;  Surgeon: Rona Ravens, MD;  Location: GI PROCEDURES MEMORIAL [facility];  Service: Gastroenterology   ??? PR COLONOSCOPY W/BIOPSY SINGLE/MULTIPLE Left 06/24/2019    Procedure: COLONOSCOPY, FLEXIBLE, PROXIMAL TO SPLENIC FLEXURE; WITH BIOPSY, SINGLE OR MULTIPLE;  Surgeon: Bronson Curb, MD;  Location: [facility] GI PROCEDURES [facility];  Service: Gastroenterology   ??? PR CYSTO/URETERO W/LITHOTRIPSY &INDWELL STENT INSRT Left 06/13/2017    Procedure: CYSTOURETHROSCOPY, WITH URETEROSCOPY AND/OR PYELOSCOPY; WITH LITHOTRIPSY INCLUDING INSERTION OF INDWELLING URETERAL STENT;  Surgeon: Kem Kays, MD;  Location: CYSTO PROCEDURE SUITES [facility];  Service: Urology   ??? PR CYSTO/URETERO W/LITHOTRIPSY &INDWELL STENT INSRT Right 10/03/2020    Procedure: CYSTOURETHROSCOPY, WITH URETEROSCOPY AND/OR PYELOSCOPY; WITH LITHOTRIPSY INCLUDING INSERTION OF INDWELLING URETERAL STENT;  Surgeon: Marilynne Drivers, MD;  Location: CYSTO PROCEDURE SUITES [facility];  Service: Urology   ??? PR CYSTOSCOPY,INSERT URETERAL STENT Right 08/20/2020    Procedure: CYSTOURETHROSCOPY,  WITH INSERTION OF INDWELLING URETERAL STENT (EG, GIBBONS OR DOUBLE-J TYPE);  Surgeon: Rolanda Lundborg, MD;  Location: MAIN OR [facility];  Service: Urology   ??? PR CYSTOSCOPY,REMV CALCULUS,SIMPLE Right 10/03/2020    Procedure: CYSTOURETHROSCOPY, WITH REMOVAL OF FOREIGN BODY, CALCULUS OR URETERAL STENT FROM URETHRA OR BLADDER; SIMPLE;  Surgeon: Marilynne Drivers, MD;  Location: CYSTO PROCEDURE SUITES [facility];  Service: Urology   ??? PR CYSTOURETHROSCOPY,FULGUR 0.5-2 CM LESN N/A 04/13/2020    Procedure: CYSTOURETHROSCOPY, W/FULGURATION (INCLUDE CRYOSURGERY/LASER) &/OR RESECTION; SM BLADDER TUMOR(0.5CM TO 2CM);  Surgeon: Phillips Grout, MD;  Location[facility] OR [facility];  Service: Urology   ??? PR CYSTOURETHROSCOPY,URETER CATHETER Left 06/13/2017    Procedure: CYSTOURETHROSCOPY, W/URETERAL CATHETERIZATION, W/WO IRRIG, INSTILL, OR URETEROPYELOG, EXCLUS OF RADIOLG SVC;  Surgeon: Kem Kays, MD;  Location: CYSTO PROCEDURE SUITES [facility];  Service: Urology   ??? PR CYSTOURETHROSCOPY,URETER CATHETER Right 10/03/2020    Procedure: CYSTOURETHROSCOPY, W/URETERAL CATHETERIZATION, W/WO IRRIG, INSTILL, OR URETEROPYELOG, EXCLUS OF RADIOLG SVC;  Surgeon: Marilynne Drivers, MD;  Location: CYSTO PROCEDURE SUITES [facility];  Service: Urology   ??? PR ENDOSCOPIC US EXAM, ESOPH N/A 06/15/2014    Procedure: UGI ENDOSCOPY; WITH ENDOSCOPIC ULTRASOUND EXAMINATION LIMITED TO THE ESOPHAGUS;  Surgeon: Malcolm Metro, MD;  Location: GI PROCEDURES MEMORIAL [facility];  Service: Gastroenterology   ??? PR EXCISION TUMOR SOFT TISSUE BACK/FLANK SUBQ 3+CM Right 10/08/2019    Procedure: EXCISION, TUMOR, SOFT TISSUE OF BACK OR FLANK, SUBCUTANEOUS; 3 CM OR GREATER;  Surgeon: Aris Everts, MD;  Location: ASC OR [facility];  Service: Surgical Oncology Breast   ??? PR TRANSURETHRAL RESEC BLADDER NECK N/A 04/13/2020    Procedure: TRANSURETHRAL RESECTION OF BLADDER NECK (SEPARATE PROCEDURE);  Surgeon: Phillips Grout, MD;  Location: [facility] OR [facility];  Service: Urology   ??? PR UPPER GI ENDOSCOPY,BIOPSY N/A 05/05/2014    Procedure: UGI ENDOSCOPY; WITH BIOPSY, SINGLE OR MULTIPLE;  Surgeon: Jaci Lazier, MD;  Location: GI PROCEDURES [facility]ORIAL [facility];  Service: Gastroenterology   ??? PR UPPER GI ENDOSCOPY,BIOPSY N/A 10/11/2016    Procedure: UGI ENDOSCOPY; WITH BIOPSY, SINGLE OR MULTIPLE;  Surgeon: Rona Ravens, MD;  Location: GI PROCEDURES MEMORIAL [facility];  Service: Gastroenterology   ??? PR UPPER GI ENDOSCOPY,BIOPSY N/A 06/24/2019    Procedure: UGI ENDOSCOPY; WITH BIOPSY, SINGLE OR MULTIPLE;  Surgeon: Bronson Curb, MD;  Location: [facility];  Service: Gastroenterology   ??? PROSTATE SURGERY  12/04/2013    pt poor historian   ??? SKIN BIOPSY     ??? TONSILLECTOMY AND ADENOIDECTOMY     ???  TOTAL HIP ARTHROPLASTY Right        Family History  Family History   Problem Relation Age of Onset   ??? Alcohol abuse Father    ??? Osteoporosis Mother         Per [employer], cause of death   ??? Nephrolithiasis Mother    ??? Inflammatory bowel disease Son         Colitis   ??? Alcohol abuse Daughter    ??? Heart failure Neg Hx    ??? Heart disease Neg Hx    ??? Heart attack Neg Hx    ??? Arrhythmia Neg Hx    ??? GU problems Neg Hx    ??? Kidney cancer Neg Hx    ??? Prostate cancer Neg Hx    ??? Drug abuse Neg Hx    ??? Mental illness Neg Hx    ??? Kidney disease Neg Hx    ??? Diabetes Neg Hx    ??? Lung disease Neg Hx    ??? Melanoma Neg Hx    ??? Basal cell carcinoma Neg Hx    ??? Squamous cell carcinoma Neg Hx    ??? Neuropathy Neg Hx    ??? Neuromuscular disorder Neg Hx        Social History  Social History     Socioeconomic History   ??? Marital status: Married   ??? Number of children: 3   Occupational History   ??? Occupation: Patient Processing at [employer]     Comment: Retired   ??? Occupation: [employer]     Comment: Retired   ??? Occupation: [employer]     Comment: Retired   Tobacco Use   ??? Smoking status: Never Smoker   ??? Smokeless tobacco: Never Used   Vaping Use   ??? Vaping Use: Never used   Substance and Sexual Activity   ??? Alcohol use: No     Alcohol/week: 0.0 standard drinks     Comment: Drank socially over 15 years ago   ??? Drug use: No   ??? Sexual activity: Not Currently     Partners: Female   Other Topics Concern   ??? Do you use sunscreen? No   ??? Tanning bed use? No   ??? Are you easily burned? No   ??? Excessive sun exposure? No   ??? Blistering sunburns? No     Social Determinants of Health     Financial Resource Strain: Low Risk    ??? Difficulty of Paying Living Expenses: Not hard at all   Food Insecurity: No Food Insecurity   ??? Worried About Programme researcher, broadcasting/film/video in the Last Year: Never true   ??? Ran Out of Food in the Last Year: Never true   Transportation Needs: No Transportation Needs   ??? Lack of Transportation (Medical): No   ??? Lack of Transportation (Non-Medical): No   Physical Activity: Insufficiently Active   ??? Days of Exercise per Week: 1 day   ??? Minutes of Exercise per Session: 30 min   Stress: Stress Concern Present   ??? Feeling of Stress : To some extent   Social Connections: Unknown   ??? Frequency of Communication with Friends and Family: Never   ??? Frequency of Social Gatherings with Friends and Family: More than three times a week   ??? Attends Religious Services: Patient refused   ??? Active Member of Clubs or Organizations: Patient refused   ??? Attends Banker Meetings: Patient refused   ???  Marital Status: Married           Objective     Vitals - past 24 hours  Temp:  [39.5 ??C (103.1 ??F)-39.7 ??C (103.5 ??F)] 39.6 ??C (103.3 ??F)  Heart Rate:  [101-114] 101  SpO2 Pulse:  [103-120] 120  Resp:  [26-36] 26  BP: (119-126)/(37-58) 126/42  SpO2:  [95 %-96 %] 96 % Intake/Output  No intake/output data recorded.     Physical Exam:    General: chronically ill appearing male, look uncomfortable but in no acute distress  HEENT: PERRLA, MMM  CV: RRR, no rubs, murmurs or gallops  Pulm: CTAB, good respiratory effort  GI: +BS, ecchymosis noted across abdomen, soft but tender to palption  MSK: mild peripheral edema bilaterally  Skin: diffuse purpura on RUE, scattered areas of ecchymosis on rest of body  Neuro: alert and orient X4, no focal neurological findings    The patient's hospital stay has been complicated by the following clinically significant conditions requiring additional evaluation and treatment or having a significant effect of this patient's care: - Thrombocytopenia POA requiring further investigation or monitor  - Anemia POA requiring further investigation or monitoring    Body mass index is 31.06 kg/m??.            Wt Readings from Last 12 Encounters:   02/17/2021 95.4 kg (210 lb 5.1 oz)   02/07/21 87.6 kg (193 lb 2 oz)   02/06/21 87 kg (191 lb 12.8 oz)   02/05/21 87 kg (191 lb 14.4 oz)   02/04/21 87.5 kg (192 lb 14.4 oz)   02/03/21 87.3 kg (192 lb 7.4 oz)   02/01/21 85.3 kg (188 lb)   01/31/21 85.6 kg (188 lb 11.2 oz)   01/24/21 83.6 kg (184 lb 6.4 oz)   01/13/21 86.2 kg (190 lb)   01/11/21 86.2 kg (190 lb)   12/18/20 82.6 kg (182 lb 3.2 oz)     Scheduled Meds:  ??? acetaminophen  1,000 mg Oral Q8H SCH   ??? Cefepime  2 g Intravenous Q12H [facility]   ??? finasteride  5 mg Oral Daily   ??? gabapentin  300 mg Oral TID   ??? [START ON 05-Mar-2021] levothyroxine  100 mcg Oral daily   ??? oxyCODONE  5 mg Oral Once   ??? [START ON March 05, 2021] posaconazole  300 mg Oral Daily   ??? pravastatin  40 mg Oral QPM   ??? sertraline  100 mg Oral Daily   ??? timolol  1 drop Right Eye BID   ??? valACYclovir  500 mg Oral Daily   ??? vancomycin  1,750 mg Intravenous Q24H     Continuous Infusions:  PRN Meds:.oxyCODONE      Data/Imaging Review: Reviewed in Epic and personally interpreted on 02/28/2021. See EMR for detailed results.      Elvin So, MD  Internal Medicine, PGY1

## 2021-02-12 NOTE — Unmapped (Signed)
VIR pre call attempted.  No answer.

## 2021-02-13 LAB — LEGIONELLA PNEUMOPHILA SEROGP 1 UR AG: L. pneumophila Serogp 1 Ur Ag: NEGATIVE

## 2021-02-13 LAB — VARICELLA-ZOSTER BY PCR: Varicella-Zoster, PCR: NEGATIVE

## 2021-02-13 LAB — BASIC METABOLIC PANEL
ANION GAP: 10 mmol/L (ref 5–14)
BLOOD UREA NITROGEN: 36 mg/dL — ABNORMAL HIGH (ref 9–23)
BUN / CREAT RATIO: 29
CALCIUM: 7.3 mg/dL — ABNORMAL LOW (ref 8.7–10.4)
CHLORIDE: 106 mmol/L (ref 98–107)
CO2: 22 mmol/L (ref 20.0–31.0)
CREATININE: 1.26 mg/dL — ABNORMAL HIGH
EGFR CKD-EPI (2021) MALE: 59 mL/min/{1.73_m2} — ABNORMAL LOW (ref >=60)
GLUCOSE RANDOM: 109 mg/dL (ref 70–179)
POTASSIUM: 3.4 mmol/L — ABNORMAL LOW (ref 3.5–5.1)
SODIUM: 138 mmol/L (ref 135–145)

## 2021-02-13 LAB — COMPREHENSIVE METABOLIC PANEL
ALBUMIN: 2 g/dL — ABNORMAL LOW (ref 3.4–5.0)
ALKALINE PHOSPHATASE: 100 U/L (ref 46–116)
ALT (SGPT): 41 U/L (ref 10–49)
AST (SGOT): 66 U/L — ABNORMAL HIGH (ref <=34)
BILIRUBIN TOTAL: 3.4 mg/dL — ABNORMAL HIGH (ref 0.3–1.2)
BLOOD UREA NITROGEN: 34 mg/dL — ABNORMAL HIGH (ref 9–23)
BUN / CREAT RATIO: 26
CALCIUM: 7.4 mg/dL — ABNORMAL LOW (ref 8.7–10.4)
CHLORIDE: 102 mmol/L (ref 98–107)
CO2: <10 mmol/L — CL (ref 20.0–31.0)
CREATININE: 1.32 mg/dL — ABNORMAL HIGH
EGFR CKD-EPI (2021) MALE: 56 mL/min/{1.73_m2} — ABNORMAL LOW (ref >=60)
GLUCOSE RANDOM: 160 mg/dL (ref 70–179)
POTASSIUM: 4.6 mmol/L (ref 3.4–4.8)
PROTEIN TOTAL: 4.6 g/dL — ABNORMAL LOW (ref 5.7–8.2)
SODIUM: 135 mmol/L (ref 135–145)

## 2021-02-13 LAB — BLOOD GAS CRITICAL CARE PANEL, ARTERIAL
BASE EXCESS ARTERIAL: -3.7 — ABNORMAL LOW (ref -2.0–2.0)
BASE EXCESS ARTERIAL: -17 — ABNORMAL LOW (ref -2.0–2.0)
CALCIUM IONIZED ARTERIAL (MG/DL): 4.21 mg/dL — ABNORMAL LOW (ref 4.40–5.40)
CALCIUM IONIZED ARTERIAL (MG/DL): 4.04 mg/dL — ABNORMAL LOW (ref 4.40–5.40)
GLUCOSE WHOLE BLOOD: 102 mg/dL (ref 70–179)
GLUCOSE WHOLE BLOOD: 159 mg/dL (ref 70–179)
HCO3 ARTERIAL: 20 mmol/L — ABNORMAL LOW (ref 22–27)
HCO3 ARTERIAL: 9 mmol/L — ABNORMAL LOW (ref 22–27)
HEMOGLOBIN BLOOD GAS: 6 g/dL — ABNORMAL LOW
HEMOGLOBIN BLOOD GAS: 8.2 g/dL — ABNORMAL LOW
LACTATE BLOOD ARTERIAL: 2.4 mmol/L — ABNORMAL HIGH (ref <1.3)
LACTATE BLOOD ARTERIAL: 15 mmol/L (ref <1.3)
O2 SATURATION ARTERIAL: 98.3 % (ref 94.0–100.0)
O2 SATURATION ARTERIAL: 99.8 % (ref 94.0–100.0)
PCO2 ARTERIAL: 27.8 mmHg — ABNORMAL LOW (ref 35.0–45.0)
PCO2 ARTERIAL: 19.2 mmHg — CL (ref 35.0–45.0)
PH ARTERIAL: 7.46 — ABNORMAL HIGH (ref 7.35–7.45)
PH ARTERIAL: 7.28 — ABNORMAL LOW (ref 7.35–7.45)
PO2 ARTERIAL: 92.4 mmHg (ref 80.0–110.0)
PO2 ARTERIAL: 308 mmHg — ABNORMAL HIGH (ref 80.0–110.0)
POTASSIUM WHOLE BLOOD: 3.1 mmol/L — ABNORMAL LOW (ref 3.4–4.6)
POTASSIUM WHOLE BLOOD: 4.5 mmol/L (ref 3.4–4.6)
SODIUM WHOLE BLOOD: 137 mmol/L (ref 135–145)
SODIUM WHOLE BLOOD: 136 mmol/L (ref 135–145)

## 2021-02-13 LAB — PHOSPHORUS
PHOSPHORUS: 2.6 mg/dL (ref 2.4–5.1)
PHOSPHORUS: 4.8 mg/dL (ref 2.4–5.1)

## 2021-02-13 LAB — CBC W/ AUTO DIFF
BASOPHILS ABSOLUTE COUNT: 0 10*9/L (ref 0.0–0.1)
BASOPHILS ABSOLUTE COUNT: 0 10*9/L (ref 0.0–0.1)
BASOPHILS RELATIVE PERCENT: 0.4 %
BASOPHILS RELATIVE PERCENT: 0.7 %
EOSINOPHILS ABSOLUTE COUNT: 0.1 10*9/L (ref 0.0–0.5)
EOSINOPHILS ABSOLUTE COUNT: 0 10*9/L (ref 0.0–0.5)
EOSINOPHILS RELATIVE PERCENT: 5.5 %
EOSINOPHILS RELATIVE PERCENT: 1.3 %
HEMATOCRIT: 21.1 % — ABNORMAL LOW (ref 39.0–48.0)
HEMATOCRIT: 22.9 % — ABNORMAL LOW (ref 39.0–48.0)
HEMOGLOBIN: 7.5 g/dL — ABNORMAL LOW (ref 12.9–16.5)
HEMOGLOBIN: 7.8 g/dL — ABNORMAL LOW (ref 12.9–16.5)
LYMPHOCYTES ABSOLUTE COUNT: 0.1 10*9/L — ABNORMAL LOW (ref 1.1–3.6)
LYMPHOCYTES ABSOLUTE COUNT: 0.4 10*9/L — ABNORMAL LOW (ref 1.1–3.6)
LYMPHOCYTES RELATIVE PERCENT: 5.6 %
LYMPHOCYTES RELATIVE PERCENT: 23.8 %
MEAN CORPUSCULAR HEMOGLOBIN CONC: 35.5 g/dL (ref 32.0–36.0)
MEAN CORPUSCULAR HEMOGLOBIN CONC: 34 g/dL (ref 32.0–36.0)
MEAN CORPUSCULAR HEMOGLOBIN: 31.6 pg (ref 25.9–32.4)
MEAN CORPUSCULAR HEMOGLOBIN: 31 pg (ref 25.9–32.4)
MEAN CORPUSCULAR VOLUME: 89 fL (ref 77.6–95.7)
MEAN CORPUSCULAR VOLUME: 91.3 fL (ref 77.6–95.7)
MEAN PLATELET VOLUME: 7.3 fL (ref 6.8–10.7)
MEAN PLATELET VOLUME: 7.7 fL (ref 6.8–10.7)
MONOCYTES ABSOLUTE COUNT: 0 10*9/L — ABNORMAL LOW (ref 0.3–0.8)
MONOCYTES ABSOLUTE COUNT: 0 10*9/L — ABNORMAL LOW (ref 0.3–0.8)
MONOCYTES RELATIVE PERCENT: 0.7 %
MONOCYTES RELATIVE PERCENT: 0.7 %
NEUTROPHILS ABSOLUTE COUNT: 0.8 10*9/L — ABNORMAL LOW (ref 1.8–7.8)
NEUTROPHILS ABSOLUTE COUNT: 1.2 10*9/L — ABNORMAL LOW (ref 1.8–7.8)
NEUTROPHILS RELATIVE PERCENT: 87.8 %
NEUTROPHILS RELATIVE PERCENT: 73.5 %
PLATELET COUNT: 28 10*9/L — ABNORMAL LOW (ref 150–450)
PLATELET COUNT: 36 10*9/L — ABNORMAL LOW (ref 150–450)
RED BLOOD CELL COUNT: 2.37 10*12/L — ABNORMAL LOW (ref 4.26–5.60)
RED BLOOD CELL COUNT: 2.51 10*12/L — ABNORMAL LOW (ref 4.26–5.60)
RED CELL DISTRIBUTION WIDTH: 15.6 % — ABNORMAL HIGH (ref 12.2–15.2)
RED CELL DISTRIBUTION WIDTH: 15.8 % — ABNORMAL HIGH (ref 12.2–15.2)
WBC ADJUSTED: 0.9 10*9/L — ABNORMAL LOW (ref 3.6–11.2)
WBC ADJUSTED: 1.7 10*9/L — ABNORMAL LOW (ref 3.6–11.2)

## 2021-02-13 LAB — SLIDE REVIEW

## 2021-02-13 LAB — BLOOD GAS CRITICAL CARE PANEL, VENOUS
BASE EXCESS VENOUS: -2.2 — ABNORMAL LOW (ref -2.0–2.0)
CALCIUM IONIZED VENOUS (MG/DL): 4.4 mg/dL (ref 4.40–5.40)
GLUCOSE WHOLE BLOOD: 107 mg/dL (ref 70–179)
HCO3 VENOUS: 22 mmol/L (ref 22–27)
HEMOGLOBIN BLOOD GAS: 7.8 g/dL — ABNORMAL LOW
LACTATE BLOOD VENOUS: 2.7 mmol/L — ABNORMAL HIGH (ref 0.5–1.8)
O2 SATURATION VENOUS: 65.1 % (ref 40.0–85.0)
PCO2 VENOUS: 36 mmHg — ABNORMAL LOW (ref 40–60)
PH VENOUS: 7.4 (ref 7.32–7.43)
PO2 VENOUS: 33 mmHg (ref 30–55)
POTASSIUM WHOLE BLOOD: 3.2 mmol/L — ABNORMAL LOW (ref 3.4–4.6)
SODIUM WHOLE BLOOD: 136 mmol/L (ref 135–145)

## 2021-02-13 LAB — MAGNESIUM
MAGNESIUM: 1.8 mg/dL (ref 1.6–2.6)
MAGNESIUM: 2.1 mg/dL (ref 1.6–2.6)

## 2021-02-13 LAB — APTT
APTT: 34.8 s (ref 25.1–36.5)
HEPARIN CORRELATION: 0.2

## 2021-02-13 LAB — D-DIMER, QUANTITATIVE: D-DIMER QUANTITATIVE (CW,ML,HL,HS,CH,JS,JC): 7140 ng{FEU}/mL — ABNORMAL HIGH (ref <=500)

## 2021-02-13 LAB — PROTIME-INR
INR: 1.92
PROTIME: 22.3 s — ABNORMAL HIGH (ref 9.8–12.8)

## 2021-02-13 LAB — FIBRINOGEN: FIBRINOGEN LEVEL: 551 mg/dL — ABNORMAL HIGH (ref 175–500)

## 2021-02-13 LAB — HIGH SENSITIVITY TROPONIN I - SINGLE: HIGH SENSITIVITY TROPONIN I: 285 ng/L (ref <=53)

## 2021-02-13 MED ADMIN — etomidate (AMIDATE) injection: INTRAVENOUS | @ 12:00:00 | Stop: 2021-02-13

## 2021-02-13 MED ADMIN — midazolam (VERSED) injection 2 mg: 2 mg | INTRAVENOUS | @ 13:00:00 | Stop: 2021-02-13

## 2021-02-13 MED ADMIN — fentaNYL (PF) (SUBLIMAZE) injection 100 mcg: 100 ug | INTRAVENOUS | @ 12:00:00 | Stop: 2021-02-13

## 2021-02-13 MED ADMIN — meropenem (MERREM) 500 mg in sodium chloride 0.9 % (NS) 100 mL IVPB-connector bag: 500 mg | INTRAVENOUS | @ 11:00:00 | Stop: 2021-02-13

## 2021-02-13 MED ADMIN — iohexoL (OMNIPAQUE) 350 mg iodine/mL solution 100 mL: 100 mL | INTRAVENOUS | @ 03:00:00 | Stop: 2021-02-12

## 2021-02-13 MED ADMIN — EPINEPhrine 8 mg in dextrose 5% 250 mL (32 mcg/mL) infusion PMB: 0-20 ug/min | INTRAVENOUS | @ 11:00:00 | Stop: 2021-02-13

## 2021-02-13 MED ADMIN — potassium chloride (KLOR-CON) CR tablet 20 mEq: 20 meq | ORAL | @ 01:00:00 | Stop: 2021-02-12

## 2021-02-13 MED ADMIN — HYDROmorphone (PF) (DILAUDID) injection 1 mg: 1 mg | INTRAVENOUS | @ 14:00:00 | Stop: 2021-02-13

## 2021-02-13 MED ADMIN — sodium bicarbonate injection: INTRAVENOUS | @ 13:00:00 | Stop: 2021-02-13

## 2021-02-13 MED ADMIN — phenylephrine 0.8 mg/10 mL (80 mcg/mL) injection: INTRAVENOUS | @ 12:00:00 | Stop: 2021-02-13

## 2021-02-13 MED ADMIN — phenylephrine 100 mg in sodium chloride 0.9 % 250 mL (0.4 mg/mL) infusion: 0-300 ug/min | INTRAVENOUS | @ 11:00:00 | Stop: 2021-02-13

## 2021-02-13 MED ADMIN — fentaNYL (PF) (SUBLIMAZE) 50 mcg/mL injection: INTRAVENOUS | @ 12:00:00 | Stop: 2021-02-13

## 2021-02-13 MED ADMIN — midazolam (VERSED) injection 2 mg: 2 mg | INTRAVENOUS | @ 14:00:00 | Stop: 2021-02-13

## 2021-02-13 MED ADMIN — norepinephrine bitartrate-D5W 8 mg/250 mL (32 mcg/mL) infusion: INTRAVENOUS | @ 02:00:00 | Stop: 2021-02-12

## 2021-02-13 MED ADMIN — vasopressin 20 units in 100 mL (0.2 units/mL) infusion premade vial: .03 [IU]/min | INTRAVENOUS | @ 07:00:00 | Stop: 2021-02-13

## 2021-02-13 MED ADMIN — amioDAROne 360 mg in dextrose 5% 200 mL (1.8 mg/mL) infusion PMB: 1 mg/min | INTRAVENOUS | @ 10:00:00 | Stop: 2021-02-13

## 2021-02-13 MED ADMIN — hydrocortisone sod succ (Solu-CORTEF) injection 50 mg: 50 mg | INTRAVENOUS | @ 11:00:00 | Stop: 2021-02-13

## 2021-02-13 MED ADMIN — atropine 0.1 mg/mL injection: INTRAVENOUS | @ 13:00:00 | Stop: 2021-02-13

## 2021-02-13 MED ADMIN — amiodarone in dextrose,iso-osm (NEXTERONE) 150 mg/100 mL (1.5 mg/mL) IVPB 150 mg: 150 mg | INTRAVENOUS | @ 11:00:00 | Stop: 2021-02-13

## 2021-02-13 MED ADMIN — timolol (TIMOPTIC) 0.5 % ophthalmic solution 1 drop: 1 [drp] | OPHTHALMIC | @ 01:00:00

## 2021-02-13 MED ADMIN — midazolam (VERSED) injection 2 mg: 2 mg | INTRAVENOUS | @ 12:00:00 | Stop: 2021-02-13

## 2021-02-13 MED ADMIN — acetaminophen (TYLENOL) tablet 1,000 mg: 1000 mg | ORAL

## 2021-02-13 MED ADMIN — amiodarone (CORDARONE) injection: INTRAVENOUS | @ 13:00:00 | Stop: 2021-02-13

## 2021-02-13 MED ADMIN — valACYclovir (VALTREX) tablet 500 mg: 500 mg | ORAL

## 2021-02-13 MED ADMIN — potassium & sodium phosphates 250mg (PHOS-NAK/NEUTRA PHOS) packet 2 packet: 2 | ORAL | Stop: 2021-02-12

## 2021-02-13 MED ADMIN — DAPTOmycin (CUBICIN) 1,000 mg in sodium chloride (NS) 0.9 % 50 mL IVPB: 1000 mg | INTRAVENOUS | @ 14:00:00 | Stop: 2021-02-13

## 2021-02-13 MED ADMIN — norepinephrine 8 mg in dextrose 5 % 250 mL (32 mcg/mL) infusion PMB: 0-30 ug/min | INTRAVENOUS | @ 02:00:00

## 2021-02-13 MED ADMIN — albumin human 25 % bottle 25 g: 25 g | INTRAVENOUS | @ 01:00:00 | Stop: 2021-02-12

## 2021-02-13 MED ADMIN — levothyroxine (SYNTHROID) tablet 100 mcg: 100 ug | ORAL | @ 09:00:00 | Stop: 2021-02-13

## 2021-02-13 MED ADMIN — oxyCODONE (ROXICODONE) immediate release tablet 5 mg: 5 mg | ORAL | @ 01:00:00 | Stop: 2021-02-12

## 2021-02-13 MED ADMIN — succinylcholine (ANECTINE) injection: INTRAVENOUS | @ 12:00:00 | Stop: 2021-02-13

## 2021-02-13 MED ADMIN — potassium chloride (KLOR-CON) CR tablet 40 mEq: 40 meq | ORAL | @ 09:00:00 | Stop: 2021-02-13

## 2021-02-13 MED ADMIN — norepinephrine bitartrate-D5W 8 mg/250 mL (32 mcg/mL) infusion: INTRAVENOUS | @ 13:00:00 | Stop: 2021-02-13

## 2021-02-13 MED ADMIN — gabapentin (NEURONTIN) capsule 300 mg: 300 mg | ORAL

## 2021-02-13 MED ADMIN — EPINEPHrine HCL in 5% dextrose 8 mg/250 mL (32 mcg/mL) infusion: INTRAVENOUS | @ 11:00:00 | Stop: 2021-02-13

## 2021-02-13 MED ADMIN — meropenem (MERREM) 500 mg in sodium chloride 0.9 % (NS) 100 mL IVPB-connector bag: 500 mg | INTRAVENOUS | @ 13:00:00 | Stop: 2021-02-13

## 2021-02-13 MED ADMIN — vancomycin (VANCOCIN) 1750 mg in sodium chloride (NS) 0.9 % 500 mL IVPB (premix): 1750 mg | INTRAVENOUS | @ 01:00:00 | Stop: 2021-02-19

## 2021-02-13 MED ADMIN — HYDROmorphone (PF) (DILAUDID) injection 1 mg: 1 mg | INTRAVENOUS | @ 13:00:00 | Stop: 2021-02-13

## 2021-02-13 MED ADMIN — acetaminophen (TYLENOL) tablet 1,000 mg: 1000 mg | ORAL | @ 09:00:00 | Stop: 2021-02-13

## 2021-02-13 MED ADMIN — tobramycin (NEBCIN) 260 mg in sodium chloride (NS) 0.9 % 100 mL IVPB: 260 mg | INTRAVENOUS | @ 13:00:00 | Stop: 2021-02-13

## 2021-02-13 MED ADMIN — midazolam (VERSED) 1 mg/mL injection: INTRAVENOUS | @ 12:00:00 | Stop: 2021-02-13

## 2021-02-13 MED ADMIN — norepinephrine 8 mg in dextrose 5 % 250 mL (32 mcg/mL) infusion PMB: 0-30 ug/min | INTRAVENOUS | @ 13:00:00 | Stop: 2021-02-13

## 2021-02-13 MED ADMIN — norepinephrine 8 mg in dextrose 5 % 250 mL (32 mcg/mL) infusion PMB: 0-30 ug/min | INTRAVENOUS | @ 09:00:00 | Stop: 2021-02-13

## 2021-02-13 MED ADMIN — HYDROmorphone 1 mg/mL in 0.9% sodium chloride: 0-10 mg/h | INTRAVENOUS | @ 12:00:00 | Stop: 2021-02-13

## 2021-02-13 NOTE — Unmapped (Signed)
ADULT SPECIALTY CARE TEAM  Transport Summary Note     Departing Unit: MICU Departure Time: 2245   Unit Returned To: MICU Return Time: 2330           Report received from primary nurse via SBARq. Patient prepared to transport to CT Scan via stretcher under ICU Transport Protocol. Vital signs during transport, see vital signs section of DocFlowsheet for further details. Patient is able to follow commands. O2 via nasal cannula @ 2 Lpm. Patient tolerated procedure fairly well. Universal pandemic precautions maintained throughout transport.     Returned to MICU, update and care given to primary nurse. See Doc Flowsheet for additional transport documentation.

## 2021-02-13 NOTE — Unmapped (Signed)
ADVANCED CARE PLANNING NOTE    Discussion Date:  03/09/2021    Patient has decisional Capacity:  No    Living Will:  No    Surrogate Decision Maker:  Yes    Health Care Power of Attorney:  Yes  Name:  David Riley   Contact Information:   [id number]     Discussion Participants:  Gerald Dexter, MD  Fenton Foy (Wife)    Communication of Medical Status/Prognosis:   Informed wife of [name] decompensation overnight. Patient admitted to MICU in septic shock in setting of neutropenia 2/2 chemotherapy from active AML. Source of sepsis believed to be urosepsis with nephrolithiasis noted on CT. Patient arrived to unit stable, not initially requiring pressor support. However, this morning, pt became unstable with hypotension and arrhthymias (afib w/rvr) requiring rapid intubation and cardioversion x 3 unsuccessfully. Patient then developed worsening lactic acidosis, MAP <60 despite maximum pressor support, and worsened hypotension resulting in brief asystole requiring chest compressions. Pulse regained but hypotension persists, despite maximum medical intervention.     Communication of Treatment Goals/Options:   Discussed that [name] was worsening despite being beyond maximum pressor support and would soon pass since he was DNR. Offered option of compassionate extubation/comfort care. Family stated they would not like him to suffer and formally chose comfort care.     Treatment Decisions:   -Patient Made DNR and ultimately family chose to compassionately extubate upon their arrival.     I spent over 30 minutes providing advanced care planning services for this patient.     ---  Gerald Dexter, MD PGY-3  Internal Medicine-Pediatrics  pg [phone]

## 2021-02-13 NOTE — Unmapped (Signed)
CVAD Liaison - Insertion Note      The CVAD Liaison was contacted for the insertion of Central Venous Access Device (CVAD).  A chart review performed.   Indication: Vasopressors    Prior to the start of the procedure, a time out was performed and the identity of the patient was confirmed via name, medical record number and date of birth.  The sterile field was prepared with necessary supplies and equipment verified.  Insertion site was prepped with chlorhexidine solution and allowed to dry.  Maximum sterile techniques was utilized.    CVAD was inserted by Dr. Consuello Closs.  Catheter was aspirated and flushed.  Insertion site cleansed, and sterile dressing applied per manufacturer guidelines.  The Central Line Checklist was referenced.  CVAD Liaison was present during entire procedure.  Report of the procedure given to the Primary Nurse.      Thank you for this consult,  Caren Hazy RN, CVAD Liaison     Consult Time 60 minutes (min)

## 2021-02-13 NOTE — Unmapped (Signed)
Estimated Creatinine Clearance: 62.5 mL/min (A) (based on SCr of 1.12 mg/dL (H)).

## 2021-02-13 NOTE — Unmapped (Signed)
Case Management Brief Assessment      General:  Care Manager assessed the patient by : Medical record review, Discussion with Clinical Care team    Extended Emergency Contact Information  Primary Emergency Contact: Cristal Ford [place]  Home Phone: [phone]  Mobile Phone: [phone]  Relation: Spouse      Financial Information:  Need for financial assistance?: No (Medicare)       Discharge Needs:  Concerns to be Addressed: discharge planning    Clinical risk factors: New Diagnosis, Multiple Diagnoses (Chronic), Visual or Hearing Impaired, Other (Comment) (Actively dying. DNR/DNI.)      Discharge Plan:  Screen findings are: Discharge planning needs identified or anticipated (Comment). (DNR/DNI. Per CAPP rounds, patient is actively dying.)    Estimated Discharge Date: Undetermined    Initial Assessment complete?: Yes      Additional Information:    HCDM (patient stated preference): David Riley,David Riley - Spouse - [phone]    Social Determinants of Health     Tobacco Use: Low Risk    ??? Smoking Tobacco Use: Never Smoker   ??? Smokeless Tobacco Use: Never Used   Alcohol Use: Not At Risk   ??? How often do you have a drink containing alcohol?: Never   ??? How many drinks containing alcohol do you have on a typical day when you are drinking?: 1 - 2   ??? How often do you have 5 or more drinks on one occasion?: Never   Financial Resource Strain: Low Risk    ??? Difficulty of Paying Living Expenses: Not hard at all   Food Insecurity: No Food Insecurity   ??? Worried About Programme researcher, broadcasting/film/video in the Last Year: Never true   ??? Ran Out of Food in the Last Year: Never true   Transportation Needs: No Transportation Needs   ??? Lack of Transportation (Medical): No   ??? Lack of Transportation (Non-Medical): No   Physical Activity: Insufficiently Active   ??? Days of Exercise per Week: 1 day   ??? Minutes of Exercise per Session: 30 min   Stress: Stress Concern Present   ??? Feeling of Stress : To some extent   Social Connections: Unknown   ??? Frequency of Communication with Friends and Family: Never   ??? Frequency of Social Gatherings with Friends and Family: More than three times a week   ??? Attends Religious Services: Patient refused   ??? Active Member of Clubs or Organizations: Patient refused   ??? Attends Banker Meetings: Patient refused   ??? Marital Status: Married   Catering manager Violence: Not At Risk   ??? Fear of Current or Ex-Partner: No   ??? Emotionally Abused: No   ??? Physically Abused: No   ??? Sexually Abused: No   Depression: Not at risk   ??? PHQ-2 Score: 1   Housing/Utilities: Low Risk    ??? Within the past 12 months, have you ever stayed: outside, in a car, in a tent, in an overnight shelter, or temporarily in someone else's home (i.e. couch-surfing)?: No   ??? Are you worried about losing your housing?: No   ??? Within the past 12 months, have you been unable to get utilities (heat, electricity) when it was really needed?: No   Substance Use: Low Risk    ??? Taken prescription drugs for non-medical reasons: Never   ??? Taken illegal drugs: Never   ??? Patient indicated they have taken drugs in the past year for  non-medical reasons: Yes, [positive answer(s)]: Not on file   Health Literacy: Low Risk    ??? : Never       Predictive Model Details          35% (High)  Factor Value    Calculated 03/01/2021 12:04 29% Number of active Rx orders 62    [place] Risk of Unplanned Readmission Model 8% Number of ED visits in last six months 2     7% Diagnosis of cancer present     6% Active antipsychotic Rx order present     6% ECG/EKG order present in last 6 months     6% Latest calcium low (7.4 mg/dL)     5% Latest BUN high (34 mg/dL)     5% Imaging order present in last 6 months     4% Latest hemoglobin low (7.8 g/dL)     4% Age 75     4% Phosphorous result present     4% Charlson Comorbidity Index 5     3% Number of hospitalizations in last year 1     3% Active corticosteroid Rx order present     3% Latest creatinine high (1.32 mg/dL)     1% Future appointment scheduled     1% Active ulcer medication Rx order present     1% Current length of stay 0.771 days

## 2021-02-13 NOTE — Unmapped (Incomplete)
MICU Nightshift Note     Date of Service: 02/18/2021    Active Problems:    * No active hospital problems. *  Resolved Problems:    * No resolved hospital problems. Jed Limerick cover summary       ***      Marolyn Haller, MD

## 2021-02-13 NOTE — Unmapped (Signed)
Physician Discharge Summary [facility]  MICU [facility]  [address]  [place] [address]  Dept: [phone]  Loc: [phone]     Identifying Information:   David Riley  1946/03/25  [id number]    Primary Care Physician: Jenell Milliner, MD     Code Status: DNR and DNI    Admit Date: March 08, 2021    Discharge Date: 2021/03/09     Discharge To: Deceased -  GAY RAPE had a pronouncement of death 03/09/2021 and the time of death is 11:29 AM . The pronouncement of death was made by Gerald Dexter, MD. The events prior to death were one code event requiring compressions after which patient was successfully resuscitated. He was then transitioned to DNR status. He received full pressure support therapy. He was extubated to comfort care once family arrived. The parties present at time of death was/were family and physicians. The patient's HCPOA was notified of the death. This case was not referred to the medical examiner. An autopsy was declined.    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 *      Outpatient Provider Follow Up Issues:   None    Hospital Course:   David Riley is a 75 y.o. 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 He originally required pressors at OSH but did not require further pressure support during transfer to [facility].     Upon admission, he was started on cefepime and vancomycin. Overnight, his condition started to deteriorate. He was hypotensive requiring pressor support. Tried to wean off Norepi to Vasopressin due to worsening A.fib with RVR. However, was unsuccessful. Morning of 10/11 he was started on triple pressor therapy. Patient became unstable requiring rapid intubation. Cardioversion x 3 was unsuccessful. Patient then developed worsening lactic acidosis, MAP <60 despite maximum pressor support, and worsened hypotension resulting in brief asystole requiring chest compressions. Pulse regained but hypotension persists, despite maximum medical intervention. Family was notified and transitioned him to DNR status.     Once family arrived, patient was extubated to comfort care per family's request to minimize suffering. Patient then died shortly after. Time of death officially pronounced by MICU team at 11:29AM. Family declined autopsy. Medical examiner declined case.     Touchbase with Outpatient Provider:  Warm Handoff: Not completed secondary to patient deceased upon discharge    Procedures:  arterial line and internal jugular line  No admission procedures for hospital encounter.  ______________________________________________________________________  Discharge Medications:     Your Medication List  ASK your doctor about these medications    allopurinol 300 MG tablet  Commonly known as: ZYLOPRIM  Take 1 tablet (300 mg total) by mouth daily.     finasteride 5 mg tablet  Commonly known as: PROSCAR  Take 1 tablet (5 mg total) by mouth daily.     furosemide 20 MG tablet  Commonly known as: LASIX  Take 1 tablet (20 mg total) by mouth daily as needed for swelling (STOP if weight decreased 5 pounds).     gabapentin 100 MG capsule  Commonly known as: NEURONTIN  Take up to 3 capsules tid or as directed.     hydrocortisone 2.5 % cream  Apply to face 2x/day as needed     latanoprost 0.005 % ophthalmic solution  Commonly known as: XALATAN  Administer 1 drop to both eyes nightly.     levoFLOXacin 500 MG tablet  Commonly known as: LEVAQUIN  Take 1 tablet (500 mg total) by mouth daily.     levothyroxine 100 MCG tablet  Commonly known as: SYNTHROID  Take 1 tablet (100 mcg total) by mouth daily.     lidocaine 2 % Soln  Generic drug: lidocaine 2% viscous  Take 15 mls by mouth every four (4) hours as needed for mouth pain.     metoprolol succinate 25 MG 24 hr tablet  Commonly known as: TOPROL-XL  Take 1 tablet (25 mg total) by mouth daily.     pilocarpine 5 MG tablet  Commonly known as: SALAGEN (PILOCARPINE)  Take 1 tablet (5 mg total) by mouth Three (3) times a day.     posaconazole 100 mg delayed released tablet  Commonly known as: NoxafiL  Take 3 tablets (300 mg total) by mouth daily. Do not start until instructed by outpatient oncology team.     potassium chloride 10 MEQ CR tablet  Commonly known as: KLOR-CON  Take 2 tablets (20 mEq total) by mouth daily as needed (Take Daily IF you take Lasix same day).     pravastatin 40 MG tablet  Commonly known as: PravachoL  Take 1 tablet (40 mg total) by mouth every evening.     PRESERVISION AREDS ORAL  Take by mouth Two (2) times a day.     prochlorperazine 10 MG tablet  Commonly known as: COMPAZINE  Take 1 tablet (10 mg total) by mouth every six (6) hours as needed for nausea.     sertraline 100 MG tablet  Commonly known as: ZOLOFT  Take 1 tablet (100 mg total) by mouth daily.     timolol 0.5 % ophthalmic solution  Commonly known as: TIMOPTIC  Administer 1 drop to the right eye Two (2) times a day.     traMADoL 50 mg tablet  Commonly known as: ULTRAM  Take 1 tablet (50 mg total) by mouth every six (6) hours as needed for pain.     traZODone 100 MG tablet  Commonly known as: DESYREL  Take 1 tablet (100 mg total) by mouth nightly.     valACYclovir 500 MG tablet  Commonly known as: VALTREX  Take 1 tablet (500 mg total) by mouth daily.     venetoclax 100 mg tablet  Commonly known as: VENCLEXTA  Take 1 tablet (100 mg total) by mouth daily. Take with a meal and water. Do not chew, crush, or break tablets. Do not start until instructed by outpatient oncology team.            Allergies:  Patient has no known  allergies.  ______________________________________________________________________  Pending Test Results (if blank, then none):  Pending Labs     Order Current Status    Addon Differential Only Collected (03/04/2021 1939)    Blood Gas Critical Care Panel, Arterial Collected (02-25-2021 0800)    Blood Gas Critical Care Panel, Arterial Collected (25-Feb-2021 1045)    Calcium, Ionized, Arterial Collected (02/25/21 1045)    Blood Culture #1 In process    Blood Culture #2 In process    Urine Culture In process          Most Recent Labs:  ABGs- Results in Past 2 Days  Result Component Current Result    Not in Time Range   pO2, Arterial 250 (H) (February 25, 2021)   pCO2, Arterial 25 (L) (02/25/2021)   Base Excess, Arterial -16.6 (L) (02/25/2021)   HCO3 (Bicarbonate), Arterial 10.7 (L) (25-Feb-2021)   O2 Sat, Arterial 99.8 (Feb 25, 2021)       Relevant Studies/Radiology (if blank, then none):  ECG 12 Lead    Result Date: 02-25-2021  SINUS TACHYCARDIA LEFT ANTERIOR FASCICULAR BLOCK ST & T WAVE ABNORMALITY, CONSIDER LATERAL ISCHEMIA ABNORMAL ECG WHEN COMPARED WITH ECG OF 12-Feb-2021 20:10, SINUS RHYTHM HAS REPLACED ATRIAL FIBRILLATION LEFT ANTERIOR FASCICULAR BLOCK IS NOW PRESENT NON-SPECIFIC CHANGE IN ST SEGMENT IN ANTERIOR LEADS T WAVE INVERSION NOW EVIDENT IN LATERAL LEADS    ECG 12 Lead    Result Date: 03/04/2021  ATRIAL FIBRILLATION LOW VOLTAGE QRS ABNORMAL ECG WHEN COMPARED WITH ECG OF 12-Jan-2021 16:49, ATRIAL FIBRILLATION HAS REPLACED SINUS RHYTHM    XR Chest Portable    Result Date: 02-25-21  EXAM: XR CHEST PORTABLE DATE: 02-25-2021 3:04 AM ACCESSION: [id number] UN DICTATED: February 25, 2021 4:42 AM INTERPRETATION LOCATION: [facility]     CLINICAL INDICATION: 75 years old Male with CATHETER  NON VASCULAR FIT&ADJ      COMPARISON: Chest radiograph dated 01/13/2021.     TECHNIQUE: Portable AP semiupright chest radiograph.     FINDINGS:     Right IJ central venous catheter with tip terminating at the cavoatrial junction. EKG leads project the chest and upper abdomen. Multiple surgical clips project over the right midlung.     Lungs are moderately well-inflated. Right basilar hazy opacity. Left basilar atelectasis.     Small bilateral pleural effusions.     Stable cardiomediastinal silhouette.         Right IJ central venous catheter with tip terminating at the cavoatrial junction.     Right basilar hazy opacity likely represents atelectasis, though superimposed infection cannot be excluded.     Small bilateral, right greater than left, pleural effusion.    XR Chest 1 view    Result Date: 25-Feb-2021  EXAM: XR CHEST 1 VIEW DATE: 02/25/21 9:09 AM ACCESSION: [id number] UN DICTATED: 2021/02/25 9:10 AM INTERPRETATION LOCATION: [facility]     CLINICAL INDICATION: 75 years old Male with ETT (VENTILATOR/RESPIRATOR DEP STATUS)      TECHNIQUE: Portable Chest Radiograph.         COMPARISON: Chest radiography dated 02-25-2021 2:21 AM     FINDINGS:     DEVICES: New endotracheal tube tip 6.3 cm superior to the level of the carina. Right internal jugular central venous access catheter tip projects at the lower superior vena cava.     LUNGS AND PLEURA: Mild interstitial pulmonary edema. Moderate right and small left pleural effusions. No pneumothorax.     MEDIASTINUM: Heart is borderline enlarged. Aorta with calcifications.                 *  Mild interstitial pulmonary edema. *Moderate right and small left pleural effusions.    CT Abdomen Pelvis W Contrast    Result Date: 2021/03/14  EXAM: CT ABDOMEN PELVIS W CONTRAST DATE: 02/17/2021 11:19 PM ACCESSION: [id number] UN DICTATED: 02/17/2021 11:45 PM INTERPRETATION LOCATION: [facility]     CLINICAL INDICATION: 75 years old with increased abdominal pain, concern for bleed      COMPARISON: CT abdomen pelvis dated 08/20/2020.     TECHNIQUE: A helical CT scan of the abdomen and pelvis was obtained following IV contrast from the lung bases through the pubic symphysis. Images were reconstructed in the axial plane. Coronal and sagittal reformatted images were also provided for further evaluation.         FINDINGS:     LOWER CHEST: Partially visualized moderate right and small left pleural effusions with adjacent atelectasis.     LIVER: Normal liver contour.  No focal liver lesions.     BILIARY: The gallbladder is surgically absent. Mild dilation of the common bile duct measuring up to 1.2 cm (3:39), mildly increased compared to prior. The gallbladder is absent.     SPLEEN: Normal in size and contour. Calcified focus in the spleen (2:43), likely sequelae of remote granulomatous infection.     PANCREAS: Fatty atrophy of the pancreas. No focal lesions.  No ductal dilation.     ADRENAL GLANDS: Normal appearance of the adrenal glands.     KIDNEYS/URETERS: Right-sided double-J ureteral stent traversing a superior right renal calyx to the bladder lumen. Likely mild right hydronephrosis. Left kidney interpolar region 1.3 cm renal cyst. Approximately 0.5 cm stone (2:1) is present along the course of the right ureteral stent. Multiple left renal calculi measuring up to 1.2 cm (3:52), without hydronephrosis.     BLADDER: Unremarkable.     REPRODUCTIVE ORGANS: Unremarkable.     GI TRACT: No findings of bowel obstruction or acute inflammation.  The appendix is not visualized and is surgically absent per chart review.     PERITONEUM, RETROPERITONEUM AND MESENTERY: No free air.  No ascites.  No fluid collection.     LYMPH NODES: No adenopathy. VESSELS: Hepatic and portal veins are patent.  Normal caliber aorta.  Moderate calcified atherosclerotic disease of the abdominal aorta and branch vessels.     BONES and SOFT TISSUES: Right hip arthroplasty. Multilevel degenerative changes of the spine. Chronic compression deformity T12. No aggressive osseous lesions.  No focal soft tissue lesions.             1.No CT evidence for hematoma in the abdomen or pelvis.     2.Right-sided double-J ureteral stent with likely mild right hydronephrosis and inflammatory changes surrounding the right renal pelvis and proximal ureter. There is a stone present along the course of the ureteral stent.     3.Moderate right and small left pleural effusions with adjacent atelectasis.     4.Additional chronic and incidental findings as noted above.     ==================== ADDENDUM (2021-03-14 6:08 AM): On review, the following additional findings were noted: --Postsurgical changes of the anterior abdominal wall with diffuse anasarca. Bilateral fat-containing inguinal hernias.    ______________________________________________________________________  Discharge Instructions: release patient to family for arrangements         __________  Discharge Day Services:  BP 89/51  - Pulse (!) 42  - Temp (!) 38.1 ??C (100.6 ??F)  - Resp 27  - Ht 175.3 cm (5' 9)  - Wt 95.4 kg (210 lb 5.1 oz)  - SpO2  97%  - BMI 31.06 kg/m??       Condition at Discharge: deceased    Length of Discharge: I spent greater than 30 mins in the discharge of this patient.    Paula Compton, MD, PhD  Internal Medicine & Pediatrics, PGY-1  [facility]   [phone]

## 2021-02-13 NOTE — Unmapped (Signed)
Vancomycin Therapeutic Monitoring Pharmacy Note    Mael Delap is a 75 y.o. male starting vancomycin. Date of therapy initiation: 10/10    Indication: Bacteremia/Sepsis    Prior Dosing Information: None/new initiation     Goals:  Therapeutic Drug Levels  Vancomycin trough goal: 10-15 mg/L    Additional Clinical Monitoring/Outcomes  Renal function, volume status (intake and output)    Results: Not applicable    Wt Readings from Last 1 Encounters:   02/04/2021 95.4 kg (210 lb 5.1 oz)     Creatinine   Date Value Ref Range Status   02/12/2021 1.29 (H) 0.60 - 1.10 mg/dL Final   16/02/9603 5.40 (H) 0.60 - 1.10 mg/dL Final   98/03/9146 8.29 0.60 - 1.10 mg/dL Final        Pharmacokinetic Considerations and Significant Drug Interactions:  ??? Adult (estimated initial): Vd = 67.4 L, ke = 0.046 hr-1  ??? Concurrent nephrotoxic meds: not applicable    Assessment/Plan:  Recommendation(s)  ??? Start vancomycin 1750 mg q24h  ??? Estimated trough on recommended regimen: 10-15 mg/L    Follow-up  ??? Level due: prior to fourth or fifth dose  ??? A pharmacist will continue to monitor and order levels as appropriate    Please page service pharmacist with questions/clarifications.    Kennis Carina, PharmD  2

## 2021-02-13 NOTE — Unmapped (Signed)
Arterial Line Insertion Procedure Note    Patient Name:: David Riley  Patient MRN: [id number]    Indications:  Need for continuous blood pressure monitoring and frequent blood gas analysis.    Procedure Details:   Informed consent was obtained after explanation of the risks and benefits of the procedure, refer to the consent documentation.    The left radial artery was identified using bedside ultrasound.This area was prepped and draped in the usual sterile fashion.  Lidocaine was used to anesthetize the surrounding skin area.  The artery was identified with real time ultrasound and a 20 g Arrow catheter was placed into the artery with pulsatile arterial blood return.  An arterial waveform was transduced and then the catheter was secured with suture and a transparent dressing was applied.     Condition:  The patient tolerated the procedure well and remains in the same condition as pre-procedure.    Complications:  None; patient tolerated the procedure well.    Requesting Service: Medical ICU (MDI)  Time Requested: N/A  Time Completed: ~11:30pm    Resident(s) Performing Procedure: Sandra Cockayne  Resident Year: PGY2    Significant Labs:  INR   Date Value Ref Range Status   02/16/2021 1.63  Final   07/10/2014 1.0  Final     Prothrombin Time   Date Value Ref Range Status   07/10/2014 11.3 10.1 - 12.5 s Final     APTT   Date Value Ref Range Status   01/19/2021 33.1 25.1 - 36.5 sec Final   04/19/2014 37.0 27.0 - 39.2 s Final     Platelet   Date Value Ref Range Status   02/11/2021 28 (L) 150 - 450 10*9/L Final   07/10/2014 185 150 - 440 10*9/L Final

## 2021-02-13 NOTE — Unmapped (Incomplete)
Malignant Hematology Consult Note     Patient Name: David Riley  Patient Age: 75 y.o.  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 He has been presenting with transfusion-dependence and worsening lower extremity edema to recent clinic visits.     #Acute Myeloid Leukemia  - On azacitidine (days 1-7) and venetoclax (days 1-28); D1C1 01/31/21  - Repeat BMBx 10/18  - Prophylaxis: Levofloxacin 500mg  PO daily, Posaconazole 300mg  PO daily, Viral: Valacyclovir 500mg  PO daily   -Leukoreduced blood products. Irradiated blood products are preferred, but in case of urgent transfusion needs non-irradiated blood products may be used:     #Septic shock  -Febrile to 103.4, lactate of 4; source seems to be urinary  - Vancomycin, cefepime (10/10), Dapto, merrem (10/11- )  - S/p trobramycin x 1 dose   - Arterial line placed 10/11  - Pressors: vaso, norepi, epi, phenylephrine; stress doses of hydrocortisone.     #Right pleural effusion  -No oxygen requirements at this time    #Atrial fibrillation  - S/p amiodarone bolus (10/11) now on drip    HPI:  David Riley is a 75 y.o. male patient with AML     David Riley reports fall at home yesterday for which he was seen in his local ED. He has a bruise on his head and a couple on his arms from the fall. He states they did a CT scan of his head and he was fine, so they discharged him home. His wife states she was hoping they would transfer him to [facility], but there were no beds available.             Oncology History:  Oncology History Overview Note   Referring/Local Oncologist: None    Diagnosis: Acute Myeloid Leukemia    Genetics:   Karyotype/FISH: 27 XY     Molecular Genetics:         Variants of Known/Likely Clinical Significance:   Gene Coding Predicted Protein Variant allele fraction   ASXL1 c.1782C>A p.(Cys594Ter) 22.6 %   CSF3R c.2326C>T p.(Gln776Ter) 4.9 %   IDH1 c.394C>T p.(Arg132Cys) 12.5 %   U2AF1 c.470A>C p.(Gln157Pro) 24.6 %       Pertinent Phenotypic data: blasts express CD7, CD13, CD117, CD34, and HLADR.  They are negative for CD19, CD20, CD22, surface CD3, and other analyzed surface T-cell markers.  They are also negative for cytoplasmic MPO, CD3, and TdT.     Disease-specific prognostic estimate: ELN adverse         Acute myeloid leukemia not having achieved remission (CMS-HCC)   01/19/2021 Initial Diagnosis    Acute myeloid leukemia (CMS-HCC)     01/31/2021 -  Chemotherapy    OP AML AZACITIDINE + VENETOCLAX  azacitidine 75 mg/m2 SQ on days 1-7, venetoclax ramp up Week 1 (dose dependent), then azacitidine 75 mg/m2 SQ on Days 1-7 every 28 days         Allergies:  No Known Allergies    Medications:  Reviewed in the EMR    Physical Exam:  BP 107/37  - Pulse 130  - Temp (!) 39.3 ??C (102.7 ??F)  - Resp (!) 38  - Ht 175.3 cm (5' 9)  - Wt 95.4 kg (210  lb 5.1 oz)  - SpO2 99%  - BMI 31.06 kg/m??       Results:  No results displayed because visit has over 200 results.

## 2021-02-13 NOTE — Unmapped (Signed)
Upon arrival, RN to RN handoff occurred. Following report, decision was made by providers to intubate and cardiovert the patient out of concern of overall instability. Pt was successfully intubated. Pt was synchronized cardioverted x 3 with 120J, 150J, 200J respectively with no response. Following cardioversion attempts, pt was continued on pressor therapy as documented. Pt at one point approx 0820 had acute rhythm change, bradying down and becoming asystolic. Chest compressions were initiated briefly before help was sought from nearby staff upon which patient regained ROSC. Aggressive measures were taken, see code documentation for measures taken. Pt then went into third degree AV block, pacing was initiated and electrical capture was attained, but mechanical capture could not be achieved up to 140 mA, most likely due to irreversible shock state. The family arrived around 1030. After some time to be with the patient, compassionate withdrawal of care was discussed given the inevitable prognosis. The family elected to compassionately extubate the patient and discontinue pressor support. The patient expired shortly after discontinuation of support. Corneals prepared for donation potential. Posessions given to family. Chaplain and family's own pastoral support provided.       Problem: Adult Inpatient Plan of Care  Goal: Plan of Care Review  Outcome: Patient Expired  Goal: Patient-Specific Goal (Individualized)  Outcome: Patient Expired  Goal: Absence of Hospital-Acquired Illness or Injury  Outcome: Patient Expired  Goal: Optimal Comfort and Wellbeing  Outcome: Patient Expired  Goal: Readiness for Transition of Care  Outcome: Patient Expired  Goal: Rounds/Family Conference  Outcome: Patient Expired     Problem: Skin Injury Risk Increased  Goal: Skin Health and Integrity  Outcome: Patient Expired     Problem: Impaired Wound Healing  Goal: Optimal Wound Healing  Outcome: Patient Expired

## 2021-02-13 NOTE — Unmapped (Signed)
Central Venous Catheter Insertion Procedure Note (CPT [id number] and [id number])    Pre-procedural Planning     Patient Name:: David Riley  Patient MRN: [id number]    Line type:  Triple Lumen    Indications:  Medications requiring central access    Known Bleeding Diathesis: Patient/caregiver denies any known bleeding or platelet disorder.     Antiplatelet Agents: This patient is not on an antiplatelet agent.    Systemic Anticoagulation: This patient is not on full systemic anticoagulation.    Significant Labs:  INR   Date Value Ref Range Status   02/18/21 1.63  Final   07/10/2014 1.0  Final     PT   Date Value Ref Range Status   02/18/2021 18.8 (H) 9.8 - 12.8 sec Final   06/26/2012 19.1 (H) 9.5 - 12.7 SECONDS Final     APTT   Date Value Ref Range Status   01/19/2021 33.1 25.1 - 36.5 sec Final   04/19/2014 37.0 27.0 - 39.2 s Final     Platelet   Date Value Ref Range Status   02/18/2021 16 (L) 150 - 450 10*9/L Final   07/10/2014 185 150 - 440 10*9/L Final       Consent: Informed consent was obtained after explanation of the risks (including arterial injury, pneumothorax, infection, and bleeding) and benefits of the procedure. Refer to the consent documentation.    Procedure Details     Time-out was performed immediately prior to the procedure.    The right internal jugular vein was identified using bedside ultrasound. This area was prepped and draped in the usual sterile fashion. Maximum sterile technique was used including antiseptics, cap, gloves, gown, hand hygiene, mask, and sterile sheet.  The patient was placed in Trendelenburg position. Local anesthesia with 1% lidocaine was applied subcutaneously then deep to the skin. The angiocath was then inserted into the internal jugular vein using ultrasound guidance. The angiocatheter placement was confirmed by manometry prior to dilation.    Using the Seldinger Technique a Triple Lumen was placed with each port easily flushed and freely drawing venous blood.    The catheter was secured with sutures, CHG dressings applied over the site.    Condition     The patient tolerated the procedure well and remains in the same condition as pre-procedure.    Complications and Recommendations     Complications:  None; patient tolerated the procedure well.    Plan:  CXR was ordered to verify catheter positioning.    Requesting Service: Medical ICU (MDI)    Time Requested: 0030  Time Completed: 0030  Comments: Patient tolerated the procedure well    Resident(s) Performing Procedure: Marolyn Haller, MD  Resident Year: PGY1

## 2021-02-16 LAB — CULTURE, BLOOD (ROUTINE X 2)
Culture: NO GROWTH
Culture: NO GROWTH

## 2021-02-17 LAB — AEROBIC/ANAEROBIC CULTURE W GRAM STAIN (SURGICAL/DEEP WOUND)
Culture: NO GROWTH
Gram Stain: NONE SEEN

## 2021-02-19 LAB — AEROBIC/ANAEROBIC CULTURE W GRAM STAIN (SURGICAL/DEEP WOUND): Gram Stain: NONE SEEN

## 2021-03-06 DEATH — deceased

## 2021-03-08 NOTE — Unmapped (Signed)
Entered in error

## 2021-03-09 NOTE — Unmapped (Signed)
[facility] Triage Note     Patient: David Riley     Reason for call:  return call    Time call returned: 1259     Phone Assessment: Spoke with Jamesetta So [facility] for patient. She would like some clarity on if his Leukemia was what had caused her husband to pass away. The death certificate states Sepsis.     Triage Recommendations: Reviewed chart and it was noted that their was indeed concern for sepsis upon transfer. Will reach out to team to talk with [facility] for clarity.     Patient Response: none     Outstanding tasks: Follow up with Jamesetta So.     Patient Pharmacy has been verified and primary pharmacy has been marked as preferred

## 2021-03-09 NOTE — Unmapped (Signed)
.  Hi,     [name] contacted the Communication Center requesting to speak with the care team of David Riley to discuss:    The spouse is requesting a call back on this patient at [phone] as she has some questions to ask.        Thank you,   Noland Fordyce  [facility]   [phone]

## 2021-03-09 NOTE — Unmapped (Signed)
I called her back. She did not recognize the word neutropenia. I explained that it means her husband's immune system was low/weak. I clarified that her husband died of sepsis, a bad infection, which occureed due to his low immune system and that the overarching problem was the leukemia causing him to be at risk of serious infections.    I expressed my condolences. We reflected on the events of his death, their long relationship, and the support she is getting from family.    Langley Gauss, AGPCNP-BC  Nurse Practitioner  Hematology/Oncology  [facility]

## 2021-03-22 ENCOUNTER — Encounter: Payer: Self-pay | Admitting: Urology

## 2022-11-22 NOTE — Unmapped (Signed)
Patient's wife left message requesting patient's medical records up until 03/01/2021.    CB [phone]
